# Patient Record
Sex: Female | Born: 1950 | Race: Black or African American | Hispanic: No | Smoking: Never smoker
Health system: Southern US, Community
[De-identification: ages and names within clinical notes are randomized; demographics above are authoritative.]

## PROBLEM LIST (undated history)

## (undated) DIAGNOSIS — Z8719 Personal history of other diseases of the digestive system: Secondary | ICD-10-CM

## (undated) DIAGNOSIS — I10 Essential (primary) hypertension: Secondary | ICD-10-CM

## (undated) DIAGNOSIS — K4090 Unilateral inguinal hernia, without obstruction or gangrene, not specified as recurrent: Secondary | ICD-10-CM

## (undated) DIAGNOSIS — K440 Diaphragmatic hernia with obstruction, without gangrene: Secondary | ICD-10-CM

## (undated) DIAGNOSIS — I2699 Other pulmonary embolism without acute cor pulmonale: Secondary | ICD-10-CM

## (undated) DIAGNOSIS — M1990 Unspecified osteoarthritis, unspecified site: Secondary | ICD-10-CM

## (undated) DIAGNOSIS — K3189 Other diseases of stomach and duodenum: Secondary | ICD-10-CM

## (undated) DIAGNOSIS — H409 Unspecified glaucoma: Secondary | ICD-10-CM

## (undated) DIAGNOSIS — D649 Anemia, unspecified: Secondary | ICD-10-CM

## (undated) DIAGNOSIS — K219 Gastro-esophageal reflux disease without esophagitis: Secondary | ICD-10-CM

## (undated) HISTORY — PX: DENTAL SURGERY: SHX609

## (undated) HISTORY — DX: Essential (primary) hypertension: I10

## (undated) HISTORY — DX: Other diseases of stomach and duodenum: K31.89

## (undated) HISTORY — PX: COLONOSCOPY: SHX174

## (undated) HISTORY — PX: CYST REMOVAL NECK: SHX6281

## (undated) HISTORY — DX: Other pulmonary embolism without acute cor pulmonale: I26.99

## (undated) HISTORY — DX: Diaphragmatic hernia with obstruction, without gangrene: K44.0

---

## 2001-04-26 ENCOUNTER — Encounter: Payer: Self-pay | Admitting: Family Medicine

## 2001-04-26 ENCOUNTER — Encounter: Admission: RE | Admit: 2001-04-26 | Discharge: 2001-04-26 | Payer: Self-pay | Admitting: Family Medicine

## 2001-04-30 ENCOUNTER — Ambulatory Visit (HOSPITAL_COMMUNITY): Admission: RE | Admit: 2001-04-30 | Discharge: 2001-04-30 | Payer: Self-pay | Admitting: Gastroenterology

## 2001-07-07 ENCOUNTER — Ambulatory Visit (HOSPITAL_COMMUNITY): Admission: RE | Admit: 2001-07-07 | Discharge: 2001-07-07 | Payer: Self-pay | Admitting: Gastroenterology

## 2001-07-23 ENCOUNTER — Encounter (INDEPENDENT_AMBULATORY_CARE_PROVIDER_SITE_OTHER): Payer: Self-pay | Admitting: Specialist

## 2001-07-23 ENCOUNTER — Ambulatory Visit (HOSPITAL_COMMUNITY): Admission: RE | Admit: 2001-07-23 | Discharge: 2001-07-23 | Payer: Self-pay | Admitting: Gastroenterology

## 2001-08-27 ENCOUNTER — Ambulatory Visit (HOSPITAL_COMMUNITY): Admission: RE | Admit: 2001-08-27 | Discharge: 2001-08-27 | Payer: Self-pay | Admitting: Gastroenterology

## 2006-04-20 ENCOUNTER — Encounter (HOSPITAL_COMMUNITY): Admission: RE | Admit: 2006-04-20 | Discharge: 2006-07-07 | Payer: Self-pay | Admitting: Family Medicine

## 2006-04-22 ENCOUNTER — Ambulatory Visit (HOSPITAL_COMMUNITY): Admission: RE | Admit: 2006-04-22 | Discharge: 2006-04-22 | Payer: Self-pay | Admitting: Family Medicine

## 2006-04-23 ENCOUNTER — Encounter (INDEPENDENT_AMBULATORY_CARE_PROVIDER_SITE_OTHER): Payer: Self-pay | Admitting: *Deleted

## 2006-04-23 ENCOUNTER — Encounter: Payer: Self-pay | Admitting: Obstetrics & Gynecology

## 2006-04-23 ENCOUNTER — Ambulatory Visit: Payer: Self-pay | Admitting: Obstetrics & Gynecology

## 2006-05-07 ENCOUNTER — Ambulatory Visit: Payer: Self-pay | Admitting: Obstetrics & Gynecology

## 2006-05-19 ENCOUNTER — Encounter: Admission: RE | Admit: 2006-05-19 | Discharge: 2006-05-19 | Payer: Self-pay | Admitting: Obstetrics & Gynecology

## 2006-06-18 ENCOUNTER — Ambulatory Visit: Payer: Self-pay | Admitting: Obstetrics & Gynecology

## 2007-03-10 ENCOUNTER — Ambulatory Visit: Payer: Self-pay | Admitting: Internal Medicine

## 2007-03-10 ENCOUNTER — Ambulatory Visit: Payer: Self-pay | Admitting: *Deleted

## 2010-04-14 ENCOUNTER — Encounter: Payer: Self-pay | Admitting: Family Medicine

## 2010-08-06 NOTE — Assessment & Plan Note (Signed)
NAMETHETA, LEAF             ACCOUNT NO.:  [id number]   MEDICAL RECORD NO.:  [id number]          PATIENT TYPE:  POB   LOCATION:  [facility]         FACILITY:  [facility]   PHYSICIAN:  Elsie Lincoln, MD      DATE OF BIRTH:  1950-06-20   DATE OF SERVICE:  04/23/2006                                  CLINIC NOTE   The patient is a 60 year old G5, para 3-0-2-3, LMP 03/17/06, who was  sent to me by Dr. Nilda Simmer for history of menorrhagia and a  hemoglobin around 5. The patient has not been to a gynecologist in over  4 years. She does have a history of being anemic 4 years ago and  received IV iron. She did have a colonoscopy at the time and a Pap smear  which was negative per the patient. She has never had an endometrial  biopsy that she is aware of. Her periods started being heavy about 4  years ago. She has no history of ovarian cyst, fibroid tumors, or  sexually transmitted diseases. She is sexually active currently. She is  not __________  from her anemia. The patient also received a blood  transfusion on Tuesday, which was 2 days ago. Received 3 packed units of  __________  hemoglobin __________  between 8 and 9. She also had a  history of a blood transfusion secondary to a cervical laceration during  her second pregnancy. She has never been worked up for sickle cell that  she is aware of. She was on Provera for 5 days and I am going to  continue this for 2 more weeks because I do not want her to have a  period until we figure out what is wrong with her.   PAST MEDICAL HISTORY:  The only bad illness the patient can ever  remember having is a GI bug in 1989. She has no other medical problem,  however, she is hypertensive today.   PAST SURGERY:  A BTL and a termination.   PAST GYN HISTORY:  As above plus no abnormal Pap smears. Her last Pap  smear was 4 years ago. No STDs, cysts, or fibroids. Three vaginal  deliveries, one SAB, and 1 TOP.   FAMILY HISTORY:  Her  sister had breast cancer and her last mammogram was  4 years ago and was BIRADS 1. No other familial cancers.   ALLERGIES:  None.   MEDICATIONS:  Tussionex p.r.n.   SOCIAL HISTORY:  She sometimes drinks caffeinated beverages, otherwise  does not smoke, drink, or do drugs.   REVIEW OF SYMPTOMS:  Is positive for vaginal itching, pain with  intercourse, loss of urine with coughing and sneezing, coughing up  phlegm, and swelling in the legs.   PHYSICAL EXAMINATION:  Pulse 57, blood pressure 156/91 and the recheck  was 158/90, weight 156.  GENERAL:  Well nourished, well developed, no apparent distress.  HEENT:  Normocephalic, atraumatic. Thyroid no masses.  LUNGS:  Clear to auscultation bilaterally.  HEART:  Regular rate and rhythm. No flow murmur.  BREASTS:  No masses, no lymphadenopathy.  ABDOMEN:  Soft, nontender, no organomegaly. There is about a 0.5 cm to 1  cm  umbilical hernia that the patient did not know she had. This is most  likely from her tubal ligation.  GENITALIA:  Tanner 5. Vagina pink, however, her cervix is extremely  anterior, almost beneath the pubic bone secondary to a retroverted  uterus and a large posterior fibroid. After several attempts with  different size speculums, was unable to visualize the cervix. I did pass  the Pap smear brushes over my finger into the cervical os and got a  specimen. An endometrial biopsy is not possible. The patient is very  tender with the exam, probably secondary to the large fibroid pushing  into her vagina and rectum. Rectovaginal you could feel a large fibroid  posteriorly.  RECTUM:  The patient also has a hemorrhoid that does not appear to be  thrombosed.  EXTREMITIES:  The patient does have 1+ pitting edema.   ASSESSMENT AND PLAN:  A 60 year old G5, para 3-0-1-3 female with large,  approximately 6 cm fibroid on ultrasound and severe menorrhagia causing  severe anemia requiring blood transfusions.  1. Pap smear and cultures  today.  2. The patient will most likely need a hysterectomy given the severe      anemia. We could try medications first. She could be on birth      control pills continuously but, if she is having pain with      intercourse, she will most likely need a hysterectomy and we will      talk about this in detail later.  3. The patient needs a mammogram, which was ordered today.  4. The patient is to come back in 2 weeks for result and plan. In the      meantime, the patient will be Provera, 10 mg a day, and also Tandem      vitamins with iron.           ______________________________  Elsie Lincoln, MD     KL/MEDQ  D:  04/23/2006  T:  04/23/2006  Job:  [id number]   cc:   Nilda Simmer, MD

## 2010-08-09 NOTE — Procedures (Signed)
[place]  Patient:    Alyssa Peterson, Alyssa Peterson Visit Number: [id number] MRN: [id number]          Service Type: END Location: ENDO Attending Physician:  Orland Mustard Dictated by:   Llana Aliment. Randa Evens, M.D. Proc. Date: 07/23/01 Admit Date:  07/23/2001 Discharge Date: 07/23/2001   CC:         Gretta Arab. Valentina Lucks, M.D.   Procedure Report  DATE OF BIRTH:  October 03, 1950  PROCEDURE PERFORMED:  Esophagogastroduodenscopy with biopsies.  ENDOSCOPIST:  Llana Aliment. Randa Evens, M.D.  MEDICATIONS:  INDICATIONS:  Iron deficiency anemia with the patient ultimately requiring IV iron.  DESCRIPTION OF PROCEDURE:  The procedure had been explained to the patient and consent obtained.  With the patient in the left lateral decubitus position, the Olympus scope was inserted, advanced under direct visualization.  The stomach was entered, pylorus identified and passed.  The duodenum including the bulb and second portion were seen well.  The scope was withdrawn back into the stomach.  Several random biopsies were taken of the small bowel duodenum to test for celiac disease.  There were no duodenal ulcers.  The pyloric channel, antrum and body were normal. Fundus and cardia were seen well in the retroflex view and were normal.  The scope was withdrawn and the distal and proximal  esophagus were normal.  The patient tolerated the procedure quite well, was maintained on low-flow oxygen and pulse oximeter throughout the procedure. ASSESSMENT:  Essentially normal endoscopy.  Nothing to explain the iron deficiency anemia.  PLAN:  Will check pathology.  See back in the office in three months and repeat a CBC in two months. Dictated by:   Llana Aliment. Randa Evens, M.D. Attending Physician:  Orland Mustard DD:  07/23/01 TD:  07/26/01 Job: [id number]

## 2010-08-09 NOTE — Op Note (Signed)
[place]  Patient:    Alyssa Peterson, Alyssa Peterson Visit Number: [id number] MRN: [id number]          Service Type: END Location: ENDO Attending Physician:  Orland Mustard Dictated by:   Llana Aliment. Randa Evens, M.D. Proc. Date: 04/30/01 Admit Date:  04/30/2001   CC:         Gretta Arab. Valentina Lucks, M.D.   Operative Report  PROCEDURE:  Colonoscopy.  MEDICATIONS:  Fentanyl 100 mcg, Versed 10 mg IV.  SCOPE:  Olympus pediatric video colonoscope.  INDICATIONS:  Strong family history of colon cancer.  Mother has incurable colon cancer.  DESCRIPTION OF PROCEDURE:  The procedure had been explained to the patient and consent obtained.  With the patient in the left lateral decubitus position, the Olympus pediatric video colonoscope was inserted and administered under direct visualization.  The prep was quite good.  We were able to advance to the cecum.  The ileocecal valve was seen and the scope was withdrawn.  The cecum, ascending colon, hepatic flexure, transverse colon, splenic flexure, descending, and sigmoid colon were seen well upon removal.  No polyps or lesions were seen.  No significant diverticular disease.  The scope was withdrawn.  The patient tolerated the procedure well.  ASSESSMENT:  No evidence of colon polyps in this high-risk individual.  PLAN:  Will recommend repeating in five years. Dictated by:   Llana Aliment. Randa Evens, M.D. Attending Physician:  Orland Mustard DD:  04/30/01 TD:  05/01/01 Job: [id number]

## 2014-05-16 ENCOUNTER — Ambulatory Visit (INDEPENDENT_AMBULATORY_CARE_PROVIDER_SITE_OTHER): Payer: No Typology Code available for payment source | Admitting: Sports Medicine

## 2014-05-16 VITALS — BP 142/86 | HR 76 | Temp 98.3°F | Resp 17 | Ht <= 58 in | Wt 137.0 lb

## 2014-05-16 DIAGNOSIS — H1089 Other conjunctivitis: Secondary | ICD-10-CM

## 2014-05-16 DIAGNOSIS — A499 Bacterial infection, unspecified: Secondary | ICD-10-CM

## 2014-05-16 DIAGNOSIS — H109 Unspecified conjunctivitis: Secondary | ICD-10-CM

## 2014-05-16 MED ORDER — POLYMYXIN B-TRIMETHOPRIM 10000-0.1 UNIT/ML-% OP SOLN
1.0000 [drp] | Freq: Four times a day (QID) | OPHTHALMIC | Status: AC
Start: 2014-05-16 — End: 2014-05-22

## 2014-05-16 NOTE — Patient Instructions (Signed)

## 2014-05-16 NOTE — Progress Notes (Signed)
   Subjective:    Patient ID: Alyssa LoronHelen V Peterson, female    DOB: 10-17-1950, 64 y.o.   MRN: [id number]  [name] is a 64 year-old female who presents with concern over right eye conjunctivitis. Onset was about 2 days ago, characterized by a pink eye. She has also had mild URI symptoms, characterized by a cough. She has tried vysine eye drops. She describes mild eye soreness and like "sand in her eye". In the AM she notes some crusting. 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 She does not wear contact lenses. She has had 1 or 2 prior episodes of pink eye.  Denies headache, fevers, chills, visual changes, eye pain, photophobia, hx of eye trauma, arthralgias, weight loss, or rash.  Sexual Hx: No risk for gonorrhea. No dysuria or vaginal discharge. Social Hx: Works at a daycare.  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 Recent Travel: None Review of Systems 7 point review of systems was performed and was otherwise negative unless noted in the history of present illness.     Objective:   Physical Exam BP 142/86 mmHg  Pulse 76  Temp(Src) 98.3 F (36.8 C) (Oral)  Resp 17  Ht 4' 5.5" (1.359 m)  Wt 137 lb (62.143 kg)  BMI 33.65 kg/m2  SpO2 100% General appearance: alert, cooperative and appears stated age Eyes: positive findings: conjunctiva: 1+ bacterial conjunctivitis in right eye, scant white dry crusted exudate in right eye. No corneal abrasion or ulceration visible with ophthalmoscope. Left eye without abnormality. Visual acuity grossly intact. Ears: normal TM's and external ear canals both ears Nose: Nares normal. Septum midline. Mucosa normal. No drainage or sinus tenderness. Throat: lips, mucosa, and tongue normal; teeth and gums normal Neck: mild anterior cervical adenopathy, no carotid bruit, no JVD, supple, symmetrical, trachea midline and thyroid not enlarged, symmetric, no tenderness/mass/nodules Lymph nodes: Cervical adenopathy: soft,  tender, mobile bilateral anter cervical LAD and post-auricular LAD, no pre-auricular LAD     Assessment & Plan:  1. Acute Bacterial Conjunctivitis -Supportive Cares Discussed -Rx Poly-Trim drops 1-2 drops both eyes QID x 5-7 days -Note for work at daycare given for at least 24 hrs of drops, no exudates before return -Discussed hand washing, strategies to avoid spread, and reasons to call or RTC including persistent fevers. Increasing eye pain, worsening visual acuity, headache, nausea, vomiting, or for any other concerns. She verbalized understanding and agreement. -Follow-up if needed.  Dr. Joellyn HaffPick-Jacobs, DO Sports Medicine Fellow

## 2014-05-19 NOTE — Progress Notes (Signed)
The recorded information has been reviewed and considered. Agree with A/P Dr Keymon Mcelroy   

## 2016-04-04 ENCOUNTER — Encounter: Payer: Self-pay | Admitting: Family Medicine

## 2016-04-04 ENCOUNTER — Ambulatory Visit (INDEPENDENT_AMBULATORY_CARE_PROVIDER_SITE_OTHER): Payer: BLUE CROSS/BLUE SHIELD | Admitting: Family Medicine

## 2016-04-04 VITALS — BP 120/84 | HR 85 | Temp 98.4°F | Resp 16 | Ht 63.5 in | Wt 131.0 lb

## 2016-04-04 DIAGNOSIS — Z1211 Encounter for screening for malignant neoplasm of colon: Secondary | ICD-10-CM | POA: Diagnosis not present

## 2016-04-04 DIAGNOSIS — Z8371 Family history of colonic polyps: Secondary | ICD-10-CM | POA: Diagnosis not present

## 2016-04-04 DIAGNOSIS — L03012 Cellulitis of left finger: Secondary | ICD-10-CM | POA: Diagnosis not present

## 2016-04-04 DIAGNOSIS — R1909 Other intra-abdominal and pelvic swelling, mass and lump: Secondary | ICD-10-CM

## 2016-04-04 MED ORDER — MUPIROCIN 2 % EX OINT: 1.0000 "application " | TOPICAL_OINTMENT | Freq: Two times a day (BID) | CUTANEOUS | 0 refills | Status: DC

## 2016-04-04 MED ORDER — CHLORHEXIDINE GLUCONATE 4 % EX LIQD: Freq: Every day | CUTANEOUS | 0 refills | Status: DC | PRN

## 2016-04-04 NOTE — Progress Notes (Signed)
Chief Complaint  Patient presents with  . 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  She denies purulent drainage from the finger. No trauma. No known injury.  Nodule inguinal - left She reports that she has been having a left inguinal nodule that has been present for 8 months but a week ago a little boy at work ran into her and the nodule got tender and so she became concerned.  No fevers or chills. No abdominal pain. No history of hernias or prior abdominal surgery.   Family history of colon polyps She reports that she had a normal colonoscopy at age 71.  She states that she has a family history of colon polyps in her brother. She denies rectal bleeding or unexplained weight loss.   No past medical history on file.  Current Outpatient Prescriptions  Medication Sig Dispense Refill  . chlorhexidine (HIBICLENS) 4 % external liquid Apply topically daily as needed. 120 mL 0  . mupirocin ointment (BACTROBAN) 2 % Apply 1 application topically 2 (two) times daily. 22 g 0   No current facility-administered medications for this visit.     Allergies:  Allergies  Allergen Reactions  . Penicillins Hives    No past surgical history on file.  Social History   Social History  . Marital status: Divorced    Spouse name: N/A  . Number of children: N/A  . Years of education: N/A   Social History Main Topics  . Smoking status: Never Smoker  . Smokeless tobacco: None  . Alcohol use None  . Drug use: Unknown  . Sexual activity: Not Asked   Other Topics Concern  . None   Social History Narrative  . None    Review of Systems  Constitutional: Negative for chills, fever and weight loss.  Respiratory: Negative for cough, shortness of breath and wheezing.   Cardiovascular: Negative for chest pain, palpitations  and leg swelling.  Gastrointestinal: Negative for abdominal pain, blood in stool, constipation, diarrhea, heartburn, melena, nausea and vomiting.  Musculoskeletal: Negative for joint pain, myalgias and neck pain.  Skin: Negative for itching and rash.    Objective: Vitals:   04/04/16 1715  BP: 120/84  Pulse: 85  Resp: 16  Temp: 98.4 F (36.9 C)  SpO2: 99%  Weight: 131 lb (59.4 kg)  Height: 5' 3.5" (1.613 m)    Physical Exam  Constitutional: She is oriented to person, place, and time. She appears well-developed and well-nourished.  HENT:  Head: Normocephalic and atraumatic.  Right Ear: External ear normal.  Left Ear: External ear normal.  Eyes: Conjunctivae and EOM are normal.  Cardiovascular: Normal rate, regular rhythm and normal heart sounds.   Pulmonary/Chest: Effort normal and breath sounds normal. No respiratory distress. She has no wheezes.  Abdominal: Soft. Bowel sounds are normal. She exhibits no distension and no mass. There is no tenderness. There is no rebound and no guarding. A hernia is present.  Palpable left inguinal nodule 3cm x 3cm, oval, nontender, soft  Neurological: She is alert and oriented to person, place, and time.  Skin: Skin is warm. Capillary refill takes less than 2 seconds. No rash noted.  Psychiatric: She has a normal mood and affect. Her behavior is normal. Judgment and thought content normal.    Assessment  and Plan [name] was seen today for hand pain.  Diagnoses and all orders for this visit:  Paronychia of finger of left hand- topical care, advised proper hand hygiene and wearing gloves -     mupirocin ointment (BACTROBAN) 2 %; Apply 1 application topically 2 (two) times daily. -     chlorhexidine (HIBICLENS) 4 % external liquid; Apply topically daily as needed.  Inguinal nodule- discussed follow up with General Surgery -     Ambulatory referral to General Surgery  Family history of colonic polyps Screening for colon cancer -      Discussed screening -     [facility] COLONOSCOPY      Zoe A Creta LevinStallings

## 2016-04-04 NOTE — Patient Instructions (Addendum)
   IF you received an x-ray today, you will receive an invoice from [facility]. Please contact [facility] at [phone] with questions or concerns regarding your invoice.   IF you received labwork today, you will receive an invoice from [facility]. Please contact [facility] at [phone] with questions or concerns regarding your invoice.   Our billing staff will not be able to assist you with questions regarding bills from these companies.  You will be contacted with the lab results as soon as they are available. The fastest way to get your results is to activate your My Chart account. Instructions are located on the last page of this paperwork. If you have not heard from us regarding the results in 2 weeks, please contact this office.     Paronychia Introduction Paronychia is an infection of the skin that surrounds a nail. It usually affects the skin around a fingernail, but it may also occur near a toenail. It often causes pain and swelling around the nail. This condition may come on suddenly or develop over a longer period. In some cases, a collection of pus (abscess) can form near or under the nail. Usually, paronychia is not serious and it clears up with treatment. What are the causes? This condition may be caused by bacteria or fungi. It is commonly caused by either Streptococcus or Staphylococcus bacteria. The bacteria or fungi often cause the infection by getting into the affected area through an opening in the skin, such as a cut or a hangnail. What increases the risk? This condition is more likely to develop in:  People who get their hands wet often, such as those who work as dishwashers, bartenders, or nurses.  People who bite their fingernails or suck their thumbs.  People who trim their nails too short.  People who have hangnails or injured fingertips.  People who get manicures.  People who have diabetes. What are the signs or symptoms? Symptoms of  this condition include:  Redness and swelling of the skin near the nail.  Tenderness around the nail when you touch the area.  Pus-filled bumps under the cuticle. The cuticle is the skin at the base or sides of the nail.  Fluid or pus under the nail.  Throbbing pain in the area. How is this diagnosed? This condition is usually diagnosed with a physical exam. In some cases, a sample of pus may be taken from an abscess to be tested in a lab. This can help to determine what type of bacteria or fungi is causing the condition. How is this treated? Treatment for this condition depends on the cause and severity of the condition. If the condition is mild, it may clear up on its own in a few days. Your health care provider may recommend soaking the affected area in warm water a few times a day. When treatment is needed, the options may include:  Antibiotic medicine, if the condition is caused by a bacterial infection.  Antifungal medicine, if the condition is caused by a fungal infection.  Incision and drainage, if an abscess is present. In this procedure, the health care provider will cut open the abscess so the pus can drain out. Follow these instructions at home:  Soak the affected area in warm water if directed to do so by your health care provider. You may be told to do this for 20 minutes, 2-3 times a day. Keep the area dry in between soakings.  Take medicines only as directed by your   health care provider.  If you were prescribed an antibiotic medicine, finish all of it even if you start to feel better.  Keep the affected area clean.  Do not try to drain a fluid-filled bump yourself.  If you will be washing dishes or performing other tasks that require your hands to get wet, wear rubber gloves. You should also wear gloves if your hands might come in contact with irritating substances, such as cleaners or chemicals.  Follow your health care provider's instructions about:  Wound  care.  Bandage (dressing) changes and removal. Contact a health care provider if:  Your symptoms get worse or do not improve with treatment.  You have a fever or chills.  You have redness spreading from the affected area.  You have continued or increased fluid, blood, or pus coming from the affected area.  Your finger or knuckle becomes swollen or is difficult to move. This information is not intended to replace advice given to you by your health care provider. Make sure you discuss any questions you have with your health care provider. Document Released: 09/03/2000 Document Revised: 08/16/2015 Document Reviewed: 02/15/2014  2017 Elsevier  

## 2016-04-09 ENCOUNTER — Ambulatory Visit: Payer: BLUE CROSS/BLUE SHIELD | Admitting: Family Medicine

## 2016-04-16 ENCOUNTER — Ambulatory Visit (INDEPENDENT_AMBULATORY_CARE_PROVIDER_SITE_OTHER): Payer: BLUE CROSS/BLUE SHIELD | Admitting: Family Medicine

## 2016-04-16 ENCOUNTER — Encounter: Payer: Self-pay | Admitting: Family Medicine

## 2016-04-16 VITALS — BP 140/84 | HR 87 | Temp 99.0°F | Resp 16 | Ht 63.5 in | Wt 129.6 lb

## 2016-04-16 DIAGNOSIS — Z1211 Encounter for screening for malignant neoplasm of colon: Secondary | ICD-10-CM

## 2016-04-16 DIAGNOSIS — Z124 Encounter for screening for malignant neoplasm of cervix: Secondary | ICD-10-CM

## 2016-04-16 DIAGNOSIS — Z1159 Encounter for screening for other viral diseases: Secondary | ICD-10-CM | POA: Diagnosis not present

## 2016-04-16 DIAGNOSIS — Z0000 Encounter for general adult medical examination without abnormal findings: Secondary | ICD-10-CM

## 2016-04-16 LAB — POCT URINALYSIS DIP (MANUAL ENTRY)
BILIRUBIN UA: NEGATIVE
BILIRUBIN UA: NEGATIVE
Blood, UA: NEGATIVE
Glucose, UA: NEGATIVE
LEUKOCYTES UA: NEGATIVE
NITRITE UA: NEGATIVE
PROTEIN UA: NEGATIVE
Spec Grav, UA: 1.025
Urobilinogen, UA: 1
pH, UA: 7

## 2016-04-16 NOTE — Patient Instructions (Addendum)
We recommend that you schedule a mammogram for breast cancer screening. Typically, you do not need a referral to do this. Please contact a local imaging center to schedule your mammogram.  [facility] - [phone](336) [phone]  *ask for the Radiology Department [facility]) - [phone] or [phone]  [facility] - [phone] [facility] - [phone]) [phone] [facility] - [phone]) [phone]  *ask for the Radiology Department [facility] - [phone]  *ask for the Radiology Department [facility] - [phone]  *ask for the Mammography Department [facility] - [phone]    IF you received an x-ray today, you will receive an invoice from [facility]. Please contact [facility] at [phone] with questions or concerns regarding your invoice.   IF you received labwork today, you will receive an invoice from [facility]. Please contact [facility] at [phone] with questions or concerns regarding your invoice.   Our billing staff will not be able to assist you with questions regarding bills from these companies.  You will be contacted with the lab results as soon as they are available. The fastest way to get your results is to activate your My Chart account. Instructions are located on the last page of this paperwork. If you have not heard from us regarding the results in 2 weeks, please contact this office.     We recommend that you schedule a mammogram for breast cancer screening. Typically, you do not need a referral to do this. Please contact a local imaging center to schedule your mammogram.  [facility] - [phone](336) [phone]  *ask for the Radiology Department [facility]) - [phone] or [phone]  [facility] - [phone] [facility] - [phone]) [phone] [facility] - [phone]) [phone]  *ask for the  Radiology Department [facility] - [phone]) [phone]  *ask for the Radiology Department [facility] - [phone]  *ask for the Mammography Department [facility] - [phone]

## 2016-04-16 NOTE — Progress Notes (Signed)
Chief Complaint  Patient presents with  . Annual Exam    with pap    Subjective:  Alyssa Peterson is a 66 y.o. female here for a health maintenance visit.  Patient is established pt  Patient Active Problem List   Diagnosis Date Noted  . Family history of colonic polyps 04/04/2016    No past medical history on file.  No past surgical history on file.   Outpatient Medications Prior to Visit  Medication Sig Dispense Refill  . chlorhexidine (HIBICLENS) 4 % external liquid Apply topically daily as needed. 120 mL 0  . mupirocin ointment (BACTROBAN) 2 % Apply 1 application topically 2 (two) times daily. 22 g 0   No facility-administered medications prior to visit.     Allergies  Allergen Reactions  . Penicillins Hives     No family history on file.   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 Marital status: Divorced    Spouse name: N/A  . Number of children: N/A  . Years of education: N/A   Occupational History  . Not on file.   Social History Main Topics  . Smoking status: Never Smoker  . Smokeless tobacco: Never Used  . Alcohol use No  . Drug use: Unknown  . Sexual activity: Not on file   Other Topics Concern  . Not on file   Social History Narrative  . No narrative on file   History  Alcohol Use No   History  Smoking Status  . Never Smoker  Smokeless Tobacco  . Never Used   History  Drug use: Unknown    GYN: Sexual Health Menstrual status: absent LMP: No LMP recorded. Patient is postmenopausal. Last pap smear: see HM section History of abnormal pap smears:  Sexually active: no  Current contraception: none  Health Maintenance: See under health Maintenance activity for review of completion dates as well.  There is no immunization history on  file for this patient.    Depression Screen-PHQ2/9 [facility] 04[facility]2016  Decreased Interest 0  Down, Depressed, Hopeless 0  PHQ - 2 Score 0      Depression Severity and Treatment Recommendations:  0-4= None  5-9= Mild / Treatment: Support, educate to call if worse; return in one month  10-14= Moderate / Treatment: Support, watchful waiting; Antidepressant or Psycotherapy  15-19= Moderately severe / Treatment: Antidepressant OR Psychotherapy  >= 20 = Major depression, severe / Antidepressant AND Psychotherapy    Review of Systems   Review of Systems  Constitutional: Negative for chills and fever.  Eyes: Negative for blurred vision, double vision and photophobia.  Respiratory: Negative for shortness of breath and wheezing.   Cardiovascular: Negative for chest pain, palpitations and leg swelling.  Gastrointestinal: Negative for abdominal pain, nausea and vomiting.  Genitourinary: Negative for dysuria, frequency and urgency.  Musculoskeletal: Negative for falls, joint pain and myalgias.  Skin: Negative for itching and rash.  Neurological: Negative for dizziness, tingling, tremors and headaches.  Psychiatric/Behavioral: Negative for depression. The patient does not have insomnia.     See HPI for ROS as well.    Objective:   Vitals:   04/16/16 1640  BP: 140/84  Pulse: 87  Resp: 16  Temp: 99 F (37.2 C)  TempSrc: Oral  SpO2: 97%  Weight: 129  lb 9.6 oz (58.8 kg)  Height: 5' 3.5" (1.613 m)    Body mass index is 22.6 kg/m.  Physical Exam  Constitutional: She is oriented to person, place, and time. She appears well-developed and well-nourished.  HENT:  Head: Normocephalic and atraumatic.  Right Ear: External ear normal.  Left Ear: External ear normal.  Nose: Nose normal.  Eyes: Conjunctivae and EOM are normal. Right eye exhibits no discharge. Left eye exhibits no discharge.  Neck: Normal range of motion. Neck supple.  Abdominal: A hernia is  present. Hernia confirmed positive in the left inguinal area.  Genitourinary: Uterus normal. No breast swelling, tenderness, discharge or bleeding. There is no rash or tenderness on the right labia. There is no rash or tenderness on the left labia. Uterus is not tender. Cervix exhibits no motion tenderness, no discharge and no friability. Right adnexum displays no mass and no tenderness. Left adnexum displays no mass and no tenderness. No erythema or tenderness in the vagina. No vaginal discharge found.  Musculoskeletal: Normal range of motion. She exhibits no edema.  Lymphadenopathy:       Right: No inguinal adenopathy present.       Left: No inguinal adenopathy present.  Neurological: She is alert and oriented to person, place, and time. She has normal reflexes. She displays normal reflexes. No cranial nerve deficit.  Skin: Skin is warm. No rash noted. No erythema.  Psychiatric: She has a normal mood and affect. Her behavior is normal. Judgment and thought content normal.       Assessment/Plan:   Patient was seen for a health maintenance exam.  Counseled the patient on health maintenance issues. Reviewed her health mainteance schedule and ordered appropriate tests (see orders.) Counseled on regular exercise and weight management. Recommend regular eye exams and dental cleaning.   The following issues were addressed today for health maintenance:   Alyssa Peterson was seen today for annual exam.  Diagnoses and all orders for this visit:  Encounter for health maintenance examination- age appropriate screenings reviewed -     Basic metabolic panel -     Lipid panel -     CBC -     TSH -     POCT urinalysis dipstick  Encounter for screening colonoscopy- no current symptoms, will order screening test -     HM COLONOSCOPY  Need for hepatitis C screening test- discussed need for screening based on age -     HCV Ab w/Rflx to Verification  Pap smear for cervical cancer screening- reviewed the  purpose of pap smear for cervical cancer screening Will check for high risk HPV -     Pap IG and HPV (high risk) DNA detection  Other orders -     Interpretation:    No Follow-up on file.    Body mass index is 22.6 kg/m.:  Discussed the patient's BMI with patient. The BMI body mass index is 22.6 kg/m.     No future appointments.  Patient Instructions   We recommend that you schedule a mammogram for breast cancer screening. Typically, you do not need a referral to do this. Please contact a local imaging center to schedule your mammogram.  [facility] - [phone]  *ask for the Radiology Department [facility]) - [phone] or [phone]  [facility] - [phone] [facility] - [phone] [facility] - [phone]  *ask for the Radiology Department [facility] - [phone]  *  ask for the Radiology Department [facility] - [phone]  *ask for the Mammography Department [facility] - [phone]    IF you received an x-ray today, you will receive an invoice from [facility]. Please contact [facility] at [phone] with questions or concerns regarding your invoice.   IF you received labwork today, you will receive an invoice from [place]. Please contact [facility] at [phone] with questions or concerns regarding your invoice.   Our billing staff will not be able to assist you with questions regarding bills from these companies.  You will be contacted with the lab results as soon as they are available. The fastest way to get your results is to activate your My Chart account. Instructions are located on the last page of this paperwork. If you have not heard from [place] regarding the results in 2 weeks, please contact this office.     We recommend that you schedule a mammogram for breast cancer screening. Typically, you do not need  a referral to do this. Please contact a local imaging center to schedule your mammogram.  [facility] - [phone]  *ask for the Radiology Department [facility]) - [phone] or [phone]  [facility] - [phone] [facility] - [phone] [facility] - [phone]  *ask for the Radiology Department [facility] - [phone]  *ask for the Radiology Department [facility] - [phone]  *ask for the Mammography Department [facility] - [phone]

## 2016-04-17 ENCOUNTER — Telehealth: Payer: Self-pay | Admitting: *Deleted

## 2016-04-17 LAB — BASIC METABOLIC PANEL
BUN / CREAT RATIO: 16 (ref 12–28)
BUN: 12 mg/dL (ref 8–27)
CHLORIDE: 104 mmol/L (ref 96–106)
CO2: 26 mmol/L (ref 18–29)
Calcium: 9.5 mg/dL (ref 8.7–10.3)
Creatinine, Ser: 0.75 mg/dL (ref 0.57–1.00)
GFR calc non Af Amer: 85 mL/min/{1.73_m2} (ref >59)
GFR, EST AFRICAN AMERICAN: 98 mL/min/{1.73_m2} (ref >59)
GLUCOSE: 88 mg/dL (ref 65–99)
POTASSIUM: 4 mmol/L (ref 3.5–5.2)
SODIUM: 142 mmol/L (ref 134–144)

## 2016-04-17 LAB — CBC
HEMATOCRIT: 30.1 % — AB (ref 34.0–46.6)
HEMOGLOBIN: 9.2 g/dL — AB (ref 11.1–15.9)
MCH: 21.9 pg — AB (ref 26.6–33.0)
MCHC: 30.6 g/dL — ABNORMAL LOW (ref 31.5–35.7)
MCV: 72 fL — AB (ref 79–97)
Platelets: 286 10*3/uL (ref 150–379)
RBC: 4.21 x10E6/uL (ref 3.77–5.28)
RDW: 17.5 % — ABNORMAL HIGH (ref 12.3–15.4)
WBC: 4.1 10*3/uL (ref 3.4–10.8)

## 2016-04-17 LAB — HCV INTERPRETATION

## 2016-04-17 LAB — LIPID PANEL
CHOLESTEROL TOTAL: 133 mg/dL (ref 100–199)
Chol/HDL Ratio: 1.8 ratio units (ref 0.0–4.4)
HDL: 74 mg/dL (ref >39)
LDL Calculated: 43 mg/dL (ref 0–99)
Triglycerides: 80 mg/dL (ref 0–149)
VLDL Cholesterol Cal: 16 mg/dL (ref 5–40)

## 2016-04-17 LAB — HCV AB W/RFLX TO VERIFICATION: HCV Ab: <0.1 s/co ratio (ref 0.0–0.9)

## 2016-04-17 LAB — TSH: TSH: 0.88 u[IU]/mL (ref 0.450–4.500)

## 2016-04-17 NOTE — Telephone Encounter (Signed)
Patient called stating Dr Creta LevinStallings has a place calling her regarding her surgery, and she was told if she didn't get the message to call back. Patient states she needs to know what the message said. Please advise [phone]

## 2016-04-17 NOTE — Telephone Encounter (Signed)
Alyssa Peterson, is the number listed a call back number for the patient? I am asking only because it is not listed anywhere on her chart. I also do not see any messages that were left for her from our office. I will call her back for clarification once we confirm the correct call back number.

## 2016-04-17 NOTE — Telephone Encounter (Signed)
Yes that is the number the patient provided me, patient stated she needed to be contacted at that number due to her being a work today.

## 2016-04-17 NOTE — Telephone Encounter (Signed)
Tried calling back at number given, no answer. Called home number, female answered stating that she is not home. Will try back tomorrow.  [facility], maybe to schedule? We do not have any msg left in her chart. She can contact their office at [phone] if needed.

## 2016-04-18 LAB — PAP IG AND HPV HIGH-RISK
HPV, high-risk: NEGATIVE
PAP SMEAR COMMENT: 0

## 2016-04-19 NOTE — Telephone Encounter (Signed)
Thank you, I have called patient to advise her to call [facility] for appt on Monday. I have provided her the number to call.

## 2016-04-22 ENCOUNTER — Other Ambulatory Visit: Payer: Self-pay | Admitting: Family Medicine

## 2016-04-22 DIAGNOSIS — Z1231 Encounter for screening mammogram for malignant neoplasm of breast: Secondary | ICD-10-CM

## 2016-04-28 ENCOUNTER — Ambulatory Visit: Payer: Self-pay | Admitting: Surgery

## 2016-04-28 NOTE — H&P (Signed)
History of Present Illness Alyssa Peterson(Alyssa Peterson K. Ryla Cauthon MD; 04/28/2016 10:47 AM) Patient words: hernia.  The patient is a 66 year old female who presents with an inguinal hernia. Referred by Dr. Warrick ParisianSheila Peterson for possible left inguinal hernia  This is a healthy 66 year old female who presents with about a month's of a palpable bulge in her left groin. This bulge is not reducible. It causes occasional discomfort. She denies any obstructive symptoms. There is been minimal enlargement since she first noticed it. She was examined by her primary care physician who thought that this might represent an inguinal hernia. She presents now for surgical evaluation. She has never had any surgery in this area.     Diagnostic Studies History Alyssa Peterson(Alyssa Peterson, CMA; 04/28/2016 10:22 AM) Colonoscopy >10 years ago Pap Smear >5 years ago  Allergies Alyssa Peterson(Alyssa Peterson, CMA; 04/28/2016 10:22 AM) Penicillin G Pot in Dextrose *PENICILLINS*  Medication History (Alyssa Peterson, CMA; 04/28/2016 10:22 AM) Alyssa Peterson (334MG  Tablet Chewable, Oral) Active. Medications Reconciled  Social History Alyssa Peterson(Alyssa Peterson, CMA; 04/28/2016 10:22 AM) Caffeine use Tea. No alcohol use No drug use Tobacco use Never smoker.  Family History Alyssa Peterson(Alyssa Peterson, CMA; 04/28/2016 10:22 AM) Arthritis Father.  Pregnancy / Birth History Alyssa Peterson(Alyssa Peterson, CMA; 04/28/2016 10:22 AM) Age at menarche 17 years. Age of menopause 7051-55 Gravida 5 Maternal age 49-25 Regular periods  Other Problems Alyssa Peterson(Alyssa Peterson, CMA; 04/28/2016 10:22 AM) No pertinent past medical history     Review of Systems [facility](Alyssa Peterson CMA; 04/28/2016 10:22 AM) General Not Present- Appetite Loss, Chills, Fatigue, Fever, Night Sweats, Weight Gain and Weight Loss. Skin Present- Ulcer. Not Present- Change in Wart/Mole, Dryness, Hives, Jaundice, New Lesions, Non-Healing Wounds and Rash. HEENT Not Present- Earache, Hearing Loss, Hoarseness, Nose  Bleed, Oral Ulcers, Ringing in the Ears, Seasonal Allergies, Sinus Pain, Sore Throat, Visual Disturbances, Wears glasses/contact lenses and Yellow Eyes. Breast Not Present- Breast Mass, Breast Pain, Nipple Discharge and Skin Changes. Cardiovascular Not Present- Chest Pain, Difficulty Breathing Lying Down, Leg Cramps, Palpitations, Rapid Heart Rate, Shortness of Breath and Swelling of Extremities. Gastrointestinal Present- Gets full quickly at meals. Not Present- Abdominal Pain, Bloating, Bloody Stool, Change in Bowel Habits, Chronic diarrhea, Constipation, Difficulty Swallowing, Excessive gas, Hemorrhoids, Indigestion, Nausea, Rectal Pain and Vomiting. Female Genitourinary Not Present- Frequency, Nocturia, Painful Urination, Pelvic Pain and Urgency. Musculoskeletal Not Present- Back Pain, Joint Pain, Joint Stiffness, Muscle Pain, Muscle Weakness and Swelling of Extremities. Neurological Not Present- Decreased Memory, Fainting, Headaches, Numbness, Seizures, Tingling, Tremor, Trouble walking and Weakness. Psychiatric Not Present- Anxiety, Bipolar, Change in Sleep Pattern, Depression, Fearful and Frequent crying. Endocrine Present- Hot flashes. Not Present- Cold Intolerance, Excessive Hunger, Hair Changes, Heat Intolerance and New Diabetes. Hematology Not Present- Blood Thinners, Easy Bruising, Excessive bleeding, Gland problems, HIV and Persistent Infections.  Vitals [employer](Alyssa Peterson CMA; 04/28/2016 10:22 AM) 04/28/2016 10:21 AM Weight: 132.13 lb Height: 63.5in Body Surface Area: 1.63 m Body Mass Index: 23.04 kg/m  Temp.: 98.44F(Oral)  Pulse: 96 (Regular)  BP: 134/82 (Sitting, Left Arm, Standard)      Physical Exam Alyssa Peterson(Alyssa Peterson K. Alyssa Dearcos MD; 04/28/2016 10:48 AM)  The physical exam findings are as follows: Note:WDWN in NAD Eyes: Pupils equal, round; sclera anicteric HENT: Oral mucosa moist; good dentition Neck: No masses palpated, no thyromegaly Lungs: CTA bilaterally; normal  respiratory effort CV: Regular rate and rhythm; no murmurs; extremities well-perfused with no edema Abd: +bowel sounds, soft, non-tender, no palpable organomegaly; Left groin - 3 x 2 x  2 cm palpable mass/ bulge; non-reducible; tender to deep palpation; smooth, mobile Skin: Warm, dry; no sign of jaundice Psychiatric - alert and oriented x 4; calm mood and affect    Assessment & Plan Alyssa Peterson K. Alyssa Crickenberger MD; 04/28/2016 10:49 AM)  HERNIA, INGUINAL (K40.90) Impression: Left inguinal  Current Plans Schedule for Surgery - Left inguinal hernia repair with mesh. The surgical procedure has been discussed with the patient. Potential risks, benefits, alternative treatments, and expected outcomes have been explained. All of the patient's questions at this time have been answered. The likelihood of reaching the patient's treatment goal is good. The patient understand the proposed surgical procedure and wishes to proceed. Note:This mass likely represents either a left inguinal hernia with incarcerated fat versus a subcutaneous lipoma that is causing discomfort by putting pressure on the underlying muscle. The patient is quite thin is fairly active. We will plan to repair this as a left inguinal hernia but there is the possibility that we open her groin that this will just be a large subcutaneous lipoma.  Alyssa Peterson. Alyssa Skains, MD, [facility]  General/ Trauma Surgery  04/28/2016 10:49 AM

## 2016-05-21 ENCOUNTER — Encounter: Payer: Self-pay | Admitting: Family Medicine

## 2016-05-26 ENCOUNTER — Ambulatory Visit: Payer: Self-pay

## 2016-06-03 ENCOUNTER — Ambulatory Visit
Admission: RE | Admit: 2016-06-03 | Discharge: 2016-06-03 | Disposition: A | Discharge status: Home / Self Care | Payer: BLUE CROSS/BLUE SHIELD | Source: Ambulatory Visit | Attending: Family Medicine | Admitting: Family Medicine

## 2016-06-03 ENCOUNTER — Ambulatory Visit: Payer: Self-pay

## 2016-06-03 ENCOUNTER — Other Ambulatory Visit: Payer: Self-pay | Admitting: Family Medicine

## 2016-06-03 DIAGNOSIS — Z1231 Encounter for screening mammogram for malignant neoplasm of breast: Secondary | ICD-10-CM

## 2016-06-05 ENCOUNTER — Encounter: Payer: Self-pay | Admitting: Family Medicine

## 2016-07-14 ENCOUNTER — Encounter (HOSPITAL_BASED_OUTPATIENT_CLINIC_OR_DEPARTMENT_OTHER): Payer: Self-pay | Admitting: *Deleted

## 2016-07-15 NOTE — Pre-Procedure Instructions (Signed)
Boost Breeze 8 oz. given to pt. - to drink by 0500 DOS; pt. voiced understanding

## 2016-07-17 ENCOUNTER — Ambulatory Visit (HOSPITAL_BASED_OUTPATIENT_CLINIC_OR_DEPARTMENT_OTHER)
Admission: RE | Admit: 2016-07-17 | Discharge: 2016-07-17 | Disposition: A | Discharge status: Home / Self Care | Payer: BLUE CROSS/BLUE SHIELD | Source: Ambulatory Visit | Attending: Surgery | Admitting: Surgery

## 2016-07-17 ENCOUNTER — Ambulatory Visit (HOSPITAL_BASED_OUTPATIENT_CLINIC_OR_DEPARTMENT_OTHER): Payer: BLUE CROSS/BLUE SHIELD | Admitting: Anesthesiology

## 2016-07-17 ENCOUNTER — Encounter (HOSPITAL_BASED_OUTPATIENT_CLINIC_OR_DEPARTMENT_OTHER): Payer: Self-pay | Admitting: *Deleted

## 2016-07-17 ENCOUNTER — Encounter (HOSPITAL_BASED_OUTPATIENT_CLINIC_OR_DEPARTMENT_OTHER)
Admission: RE | Disposition: A | Discharge status: Home / Self Care | Payer: Self-pay | Source: Ambulatory Visit | Attending: Surgery

## 2016-07-17 DIAGNOSIS — K4090 Unilateral inguinal hernia, without obstruction or gangrene, not specified as recurrent: Secondary | ICD-10-CM | POA: Diagnosis present

## 2016-07-17 DIAGNOSIS — K4190 Unilateral femoral hernia, without obstruction or gangrene, not specified as recurrent: Secondary | ICD-10-CM | POA: Insufficient documentation

## 2016-07-17 HISTORY — DX: Gastro-esophageal reflux disease without esophagitis: K21.9

## 2016-07-17 HISTORY — PX: FEMORAL HERNIA REPAIR: SHX632

## 2016-07-17 HISTORY — DX: Anemia, unspecified: D64.9

## 2016-07-17 HISTORY — DX: Unilateral inguinal hernia, without obstruction or gangrene, not specified as recurrent: K40.90

## 2016-07-17 LAB — POCT HEMOGLOBIN-HEMACUE: HEMOGLOBIN: 10.5 g/dL — AB (ref 12.0–15.0)

## 2016-07-17 SURGERY — HERNIA REPAIR FEMORAL
Anesthesia: General | Site: Inguinal | Laterality: Left

## 2016-07-17 MED ORDER — DEXAMETHASONE SODIUM PHOSPHATE 10 MG/ML IJ SOLN
INTRAMUSCULAR | Status: AC
  Filled 2016-07-17: qty 1

## 2016-07-17 MED ORDER — FENTANYL CITRATE (PF) 100 MCG/2ML IJ SOLN
INTRAMUSCULAR | Status: AC
  Filled 2016-07-17: qty 2

## 2016-07-17 MED ORDER — ONDANSETRON HCL 4 MG/2ML IJ SOLN
INTRAMUSCULAR | Status: AC
  Filled 2016-07-17: qty 2

## 2016-07-17 MED ORDER — MIDAZOLAM HCL 2 MG/2ML IJ SOLN
1.0000 mg | INTRAMUSCULAR | Status: DC | PRN
  Administered 2016-07-17: 2 mg via INTRAVENOUS

## 2016-07-17 MED ORDER — LIDOCAINE 2% (20 MG/ML) 5 ML SYRINGE
INTRAMUSCULAR | Status: DC | PRN
  Administered 2016-07-17: 50 mg via INTRAVENOUS

## 2016-07-17 MED ORDER — FENTANYL CITRATE (PF) 100 MCG/2ML IJ SOLN
INTRAMUSCULAR | Status: DC | PRN
  Administered 2016-07-17: 50 ug via INTRAVENOUS

## 2016-07-17 MED ORDER — HYDROCODONE-ACETAMINOPHEN 5-325 MG PO TABS: 1.0000 | ORAL_TABLET | Freq: Four times a day (QID) | ORAL | 0 refills | Status: DC | PRN

## 2016-07-17 MED ORDER — GLYCOPYRROLATE 0.2 MG/ML IJ SOLN
INTRAMUSCULAR | Status: DC | PRN
  Administered 2016-07-17: 0.2 mg via INTRAVENOUS

## 2016-07-17 MED ORDER — PROMETHAZINE HCL 25 MG/ML IJ SOLN: 6.2500 mg | INTRAMUSCULAR | Status: DC | PRN

## 2016-07-17 MED ORDER — DEXAMETHASONE SODIUM PHOSPHATE 4 MG/ML IJ SOLN
INTRAMUSCULAR | Status: DC | PRN
  Administered 2016-07-17: 8 mg via INTRAVENOUS

## 2016-07-17 MED ORDER — MIDAZOLAM HCL 2 MG/2ML IJ SOLN
INTRAMUSCULAR | Status: AC
  Filled 2016-07-17: qty 2

## 2016-07-17 MED ORDER — FENTANYL CITRATE (PF) 100 MCG/2ML IJ SOLN
50.0000 ug | INTRAMUSCULAR | Status: DC | PRN
  Administered 2016-07-17: 100 ug via INTRAVENOUS

## 2016-07-17 MED ORDER — BUPIVACAINE-EPINEPHRINE (PF) 0.5% -1:200000 IJ SOLN
INTRAMUSCULAR | Status: DC | PRN
  Administered 2016-07-17: 30 mL

## 2016-07-17 MED ORDER — CEFAZOLIN SODIUM-DEXTROSE 2-4 GM/100ML-% IV SOLN
2.0000 g | INTRAVENOUS | Status: AC
  Administered 2016-07-17: 2 g via INTRAVENOUS

## 2016-07-17 MED ORDER — LIDOCAINE 2% (20 MG/ML) 5 ML SYRINGE
INTRAMUSCULAR | Status: AC
  Filled 2016-07-17: qty 5

## 2016-07-17 MED ORDER — ROCURONIUM BROMIDE 10 MG/ML (PF) SYRINGE
PREFILLED_SYRINGE | INTRAVENOUS | Status: AC
  Filled 2016-07-17: qty 5

## 2016-07-17 MED ORDER — CEFAZOLIN SODIUM-DEXTROSE 2-4 GM/100ML-% IV SOLN
INTRAVENOUS | Status: AC
  Filled 2016-07-17: qty 100

## 2016-07-17 MED ORDER — HYDROMORPHONE HCL 1 MG/ML IJ SOLN
0.2500 mg | INTRAMUSCULAR | Status: DC | PRN
Start: 2016-07-17 — End: 2016-07-17

## 2016-07-17 MED ORDER — BUPIVACAINE HCL (PF) 0.5 % IJ SOLN
INTRAMUSCULAR | Status: AC
  Filled 2016-07-17: qty 30

## 2016-07-17 MED ORDER — BUPIVACAINE HCL (PF) 0.25 % IJ SOLN
INTRAMUSCULAR | Status: AC
  Filled 2016-07-17: qty 30

## 2016-07-17 MED ORDER — SCOPOLAMINE 1 MG/3DAYS TD PT72: 1.0000 | MEDICATED_PATCH | Freq: Once | TRANSDERMAL | Status: DC | PRN

## 2016-07-17 MED ORDER — SCOPOLAMINE 1 MG/3DAYS TD PT72: 1.0000 | MEDICATED_PATCH | TRANSDERMAL | Status: DC

## 2016-07-17 MED ORDER — PROPOFOL 10 MG/ML IV BOLUS
INTRAVENOUS | Status: DC | PRN
  Administered 2016-07-17: 300 mg via INTRAVENOUS

## 2016-07-17 MED ORDER — ONDANSETRON HCL 4 MG/2ML IJ SOLN
INTRAMUSCULAR | Status: DC | PRN
  Administered 2016-07-17: 4 mg via INTRAVENOUS

## 2016-07-17 MED ORDER — CHLORHEXIDINE GLUCONATE CLOTH 2 % EX PADS: 6.0000 | MEDICATED_PAD | Freq: Once | CUTANEOUS | Status: DC

## 2016-07-17 MED ORDER — BUPIVACAINE-EPINEPHRINE 0.25% -1:200000 IJ SOLN
INTRAMUSCULAR | Status: DC | PRN
  Administered 2016-07-17: 10 mL

## 2016-07-17 MED ORDER — LACTATED RINGERS IV SOLN
INTRAVENOUS | Status: DC
  Administered 2016-07-17 (×2): via INTRAVENOUS

## 2016-07-17 SURGICAL SUPPLY — 51 items
BENZOIN TINCTURE PRP APPL 2/3 (GAUZE/BANDAGES/DRESSINGS) ×3 IMPLANT
BLADE CLIPPER SURG (BLADE) ×3 IMPLANT
BLADE HEX COATED 2.75 (ELECTRODE) ×3 IMPLANT
BLADE SURG 15 STRL LF DISP TIS (BLADE) ×4 IMPLANT
BLADE SURG 15 STRL SS (BLADE) ×2
CANISTER SUCT 1200ML W/VALVE (MISCELLANEOUS) IMPLANT
CHLORAPREP W/TINT 26ML (MISCELLANEOUS) ×3 IMPLANT
CLSR STERI-STRIP ANTIMIC 1/2X4 (GAUZE/BANDAGES/DRESSINGS) ×3 IMPLANT
COVER BACK TABLE 60X90IN (DRAPES) ×3 IMPLANT
COVER MAYO STAND STRL (DRAPES) ×3 IMPLANT
DECANTER SPIKE VIAL GLASS SM (MISCELLANEOUS) ×3 IMPLANT
DRAIN PENROSE 1/2X12 LTX STRL (WOUND CARE) ×3 IMPLANT
DRAPE LAPAROTOMY TRNSV 102X78 (DRAPE) ×3 IMPLANT
DRAPE UTILITY XL STRL (DRAPES) ×3 IMPLANT
DRSG TEGADERM 4X4.75 (GAUZE/BANDAGES/DRESSINGS) ×3 IMPLANT
ELECT REM PT RETURN 9FT ADLT (ELECTROSURGICAL) ×3
ELECTRODE REM PT RTRN 9FT ADLT (ELECTROSURGICAL) ×2 IMPLANT
GAUZE SPONGE 4X4 12PLY STRL LF (GAUZE/BANDAGES/DRESSINGS) ×3 IMPLANT
GLOVE BIO SURGEON STRL SZ7 (GLOVE) ×3 IMPLANT
GLOVE BIOGEL PI IND STRL 7.5 (GLOVE) ×2 IMPLANT
GLOVE BIOGEL PI INDICATOR 7.5 (GLOVE) ×1
GOWN STRL REUS W/ TWL LRG LVL3 (GOWN DISPOSABLE) ×4 IMPLANT
GOWN STRL REUS W/TWL LRG LVL3 (GOWN DISPOSABLE) ×2
MESH ULTRAPRO 3X6 7.6X15CM (Mesh General) ×3 IMPLANT
NEEDLE HYPO 25X1 1.5 SAFETY (NEEDLE) ×3 IMPLANT
NS IRRIG 1000ML POUR BTL (IV SOLUTION) ×3 IMPLANT
PACK BASIN DAY SURGERY FS (CUSTOM PROCEDURE TRAY) ×3 IMPLANT
PENCIL BUTTON HOLSTER BLD 10FT (ELECTRODE) ×3 IMPLANT
SLEEVE SCD COMPRESS KNEE MED (MISCELLANEOUS) ×3 IMPLANT
SPONGE INTESTINAL PEANUT (DISPOSABLE) ×3 IMPLANT
SPONGE LAP 18X18 X RAY DECT (DISPOSABLE) ×3 IMPLANT
STRIP CLOSURE SKIN 1/2X4 (GAUZE/BANDAGES/DRESSINGS) ×3 IMPLANT
SUT MON AB 4-0 PC3 18 (SUTURE) ×3 IMPLANT
SUT PDS AB 0 CT 36 (SUTURE) IMPLANT
SUT PROLENE 2 0 CT2 30 (SUTURE) ×3 IMPLANT
SUT SILK 2 0 SH (SUTURE) IMPLANT
SUT SILK 3 0 SH 30 (SUTURE) IMPLANT
SUT SILK 3 0 TIES 17X18 (SUTURE)
SUT SILK 3-0 18XBRD TIE BLK (SUTURE) IMPLANT
SUT VIC AB 0 CT1 27 (SUTURE) ×1
SUT VIC AB 0 CT1 27XBRD ANBCTR (SUTURE) ×2 IMPLANT
SUT VIC AB 0 SH 27 (SUTURE) IMPLANT
SUT VIC AB 2-0 SH 27 (SUTURE) ×1
SUT VIC AB 2-0 SH 27XBRD (SUTURE) ×2 IMPLANT
SUT VIC AB 3-0 SH 27 (SUTURE) ×1
SUT VIC AB 3-0 SH 27X BRD (SUTURE) ×2 IMPLANT
SYR CONTROL 10ML LL (SYRINGE) ×3 IMPLANT
TOWEL OR 17X24 6PK STRL BLUE (TOWEL DISPOSABLE) ×3 IMPLANT
TOWEL OR NON WOVEN STRL DISP B (DISPOSABLE) ×3 IMPLANT
TUBE CONNECTING 20X1/4 (TUBING) IMPLANT
YANKAUER SUCT BULB TIP NO VENT (SUCTIONS) IMPLANT

## 2016-07-17 NOTE — H&P (Signed)
History of Present Illness  Patient words: hernia.  The patient is a 66 year old female who presents with an inguinal hernia. Referred by Dr. Warrick Peterson for possible left inguinal hernia  This is a healthy 66 year old female who presents with about a month's of a palpable bulge in her left groin. This bulge is not reducible. It causes occasional discomfort. She denies any obstructive symptoms. There is been minimal enlargement since she first noticed it. She was examined by her primary care physician who thought that this might represent an inguinal hernia. She presents now for surgical evaluation. She has never had any surgery in this area.     Diagnostic Studies History  Colonoscopy >10 years ago Pap Smear >5 years ago  Allergies  Penicillin G Pot in Dextrose *PENICILLINS*  Medication History  Rolaids (  Tablet Chewable, Oral) Active. Medications Reconciled  Social History Caffeine use Tea. No alcohol use No drug use Tobacco use Never smoker.  Family History  Arthritis Father.  Pregnancy / Birth History  Age at menarche 17 years. Age of menopause 52-55 Gravida 5 Maternal age 42-25 Regular periods  Other Problems  No pertinent past medical history     Review of Systems  General Not Present- Appetite Loss, Chills, Fatigue, Fever, Night Sweats, Weight Gain and Weight Loss. Skin Present- Ulcer. Not Present- Change in Wart/Mole, Dryness, Hives, Jaundice, New Lesions, Non-Healing Wounds and Rash. HEENT Not Present- Earache, Hearing Loss, Hoarseness, Nose Bleed, Oral Ulcers, Ringing in the Ears, Seasonal Allergies, Sinus Pain, Sore Throat, Visual Disturbances, Wears glasses/contact lenses and Yellow Eyes. Breast Not Present- Breast Mass, Breast Pain, Nipple Discharge and Skin Changes. Cardiovascular Not Present- Chest Pain, Difficulty Breathing Lying Down, Leg Cramps, Palpitations, Rapid Heart Rate, Shortness of Breath and  Swelling of Extremities. Gastrointestinal Present- Gets full quickly at meals. Not Present- Abdominal Pain, Bloating, Bloody Stool, Change in Bowel Habits, Chronic diarrhea, Constipation, Difficulty Swallowing, Excessive gas, Hemorrhoids, Indigestion, Nausea, Rectal Pain and Vomiting. Female Genitourinary Not Present- Frequency, Nocturia, Painful Urination, Pelvic Pain and Urgency. Musculoskeletal Not Present- Back Pain, Joint Pain, Joint Stiffness, Muscle Pain, Muscle Weakness and Swelling of Extremities. Neurological Not Present- Decreased Memory, Fainting, Headaches, Numbness, Seizures, Tingling, Tremor, Trouble walking and Weakness. Psychiatric Not Present- Anxiety, Bipolar, Change in Sleep Pattern, Depression, Fearful and Frequent crying. Endocrine Present- Hot flashes. Not Present- Cold Intolerance, Excessive Hunger, Hair Changes, Heat Intolerance and New Diabetes. Hematology Not Present- Blood Thinners, Easy Bruising, Excessive bleeding, Gland problems, HIV and Persistent Infections.  Vitals Weight: 132.13 lb Height: 63.5in Body Surface Area: 1.63 m Body Mass Index: 23.04 kg/m  Temp.: 98.64F(Oral)  Pulse: 96 (Regular)  BP: 134/82 (Sitting, Left Arm, Standard)      Physical Exam   The physical exam findings are as follows: Note:WDWN in NAD Eyes: Pupils equal, round; sclera anicteric HENT: Oral mucosa moist; good dentition Neck: No masses palpated, no thyromegaly Lungs: CTA bilaterally; normal respiratory effort CV: Regular rate and rhythm; no murmurs; extremities well-perfused with no edema Abd: +bowel sounds, soft, non-tender, no palpable organomegaly; Left groin - 3 x 2 x 2 cm palpable mass/ bulge; non-reducible; tender to deep palpation; smooth, mobile Skin: Warm, dry; no sign of jaundice Psychiatric - alert and oriented x 4; calm mood and affect    Assessment & Plan   HERNIA, INGUINAL (K40.90) Impression: Left inguinal  Current  Plans Schedule for Surgery - Left inguinal hernia repair with mesh. The surgical procedure has been discussed with the patient. Potential risks, benefits, alternative treatments,  and expected outcomes have been explained. All of the patient's questions at this time have been answered. The likelihood of reaching the patient's treatment goal is good. The patient understand the proposed surgical procedure and wishes to proceed. Note:This mass likely represents either a left inguinal hernia with incarcerated fat versus a subcutaneous lipoma that is causing discomfort by putting pressure on the underlying muscle. The patient is quite thin is fairly active. We will plan to repair this as a left inguinal hernia but there is the possibility that we open her groin that this will just be a large subcutaneous lipoma.  Alyssa Peterson. Alyssa Skains, MD, [facility]  General/ Trauma Surgery  07/17/2016 7:21 AM

## 2016-07-17 NOTE — Discharge Instructions (Signed)
[facility] _______[facility]   HERNIA REPAIR: POST OP INSTRUCTIONS  Always review your discharge instruction sheet given to you by the facility where your surgery was performed. IF YOU HAVE DISABILITY OR FAMILY LEAVE FORMS, YOU MUST BRING THEM TO THE OFFICE FOR PROCESSING.   DO NOT GIVE THEM TO YOUR DOCTOR.  1. A  prescription for pain medication may be given to you upon discharge.  Take your pain medication as prescribed, if needed.  If narcotic pain medicine is not needed, then you may take acetaminophen (Tylenol) or ibuprofen (Advil) as needed. 2. Take your usually prescribed medications unless otherwise directed. If you need a refill on your pain medication, please contact your pharmacy.  They will contact our office to request authorization. Prescriptions will not be filled after 5 pm or on week-ends. 3. You should follow a light diet the first 24 hours after arrival home, such as soup and crackers, etc.  Be sure to include lots of fluids daily.  Resume your normal diet the day after surgery. 4.Most patients will experience some swelling and bruising  in the groin.  Ice packs and reclining will help.  Swelling and bruising can take several days to resolve.  6. It is common to experience some constipation if taking pain medication after surgery.  Increasing fluid intake and taking a stool softener (such as Colace) will usually help or prevent this problem from occurring.  A mild laxative (Milk of Magnesia or Miralax) should be taken according to package directions if there are no bowel movements after 48 hours. 7. Unless discharge instructions indicate otherwise, you may remove your bandages 24-48 hours after surgery, and you may shower at that time.  You may have steri-strips (small skin tapes) in place directly over the incision.  These strips should be left on the skin for 7-10 days.  If your surgeon used skin glue on the incision, you may shower in 24 hours.  The glue will flake off  over the next 2-3 weeks.  Any sutures or staples will be removed at the office during your follow-up visit. 8. ACTIVITIES:  You may resume regular (light) daily activities beginning the next day--such as daily self-care, walking, climbing stairs--gradually increasing activities as tolerated.  You may have sexual intercourse when it is comfortable.  Refrain from any heavy lifting or straining until approved by your doctor.  a.You may drive when you are no longer taking prescription pain medication, you can comfortably wear a seatbelt, and you can safely maneuver your car and apply brakes. b.RETURN TO WORK:   _____________________________________________  9.You should see your doctor in the office for a follow-up appointment approximately 2-3 weeks after your surgery.  Make sure that you call for this appointment within a day or two after you arrive home to insure a convenient appointment time. 10.OTHER INSTRUCTIONS: _________________________    _____________________________________  WHEN TO CALL YOUR DOCTOR: 1. Fever over 101.0 2. Inability to urinate 3. Nausea and/or vomiting 4. Extreme swelling or bruising 5. Continued bleeding from incision. 6. Increased pain, redness, or drainage from the incision  The clinic staff is available to answer your questions during regular business hours.  Please dont hesitate to call and ask to speak to one of the nurses for clinical concerns.  If you have a medical emergency, go to the nearest emergency room or call 911.  A surgeon from [facility] is always on call at the hospital   [address], [place],  [place]  [address] ?  [address] [address], [place], [place]   [address] [phone] ? [phone] ? FAX [phone] Web site: [email or web address]  Post Anesthesia Home Care Instructions  Activity: Get plenty of rest for the remainder of the day. A responsible individual must stay with you for 24 hours following  the procedure.  For the next 24 hours, DO NOT: -Drive a car -Advertising copywriter -Drink alcoholic beverages -Take any medication unless instructed by your physician -Make any legal decisions or sign important papers.  Meals: Start with liquid foods such as gelatin or soup. Progress to regular foods as tolerated. Avoid greasy, spicy, heavy foods. If nausea and/or vomiting occur, drink only clear liquids until the nausea and/or vomiting subsides. Call your physician if vomiting continues.  Special Instructions/Symptoms: Your throat may feel dry or sore from the anesthesia or the breathing tube placed in your throat during surgery. If this causes discomfort, gargle with warm salt water. The discomfort should disappear within 24 hours.  If you had a scopolamine patch placed behind your ear for the management of post- operative nausea and/or vomiting:  1. The medication in the patch is effective for 72 hours, after which it should be removed.  Wrap patch in a tissue and discard in the trash. Wash hands thoroughly with soap and water. 2. You may remove the patch earlier than 72 hours if you experience unpleasant side effects which may include dry mouth, dizziness or visual disturbances. 3. Avoid touching the patch. Wash your hands with soap and water after contact with the patch.

## 2016-07-17 NOTE — Progress Notes (Signed)
Assisted Dr. Singer with left, ultrasound guided, transabdominal plane block. Side rails up, monitors on throughout procedure. See vital signs in flow sheet. Tolerated Procedure well.  

## 2016-07-17 NOTE — Anesthesia Postprocedure Evaluation (Addendum)
Anesthesia Post Note  Patient: Alyssa Peterson  Procedure(s) Performed: Procedure(s) (LRB): LEFT FEMORAL  HERNIA REPAIR WITH MESH (Left)  Patient location during evaluation: PACU Anesthesia Type: General Level of consciousness: sedated Pain management: pain level controlled Vital Signs Assessment: post-procedure vital signs reviewed and stable Respiratory status: spontaneous breathing and respiratory function stable Cardiovascular status: stable Anesthetic complications: no       Last Vitals:  Vitals:   07/17/16 1051 07/17/16 1053  BP: (!) 170/96   Pulse: 64 61  Resp: (!) 21 (!) 22  Temp:      Last Pain:  Vitals:   07/17/16 1015  TempSrc:   PainSc: 2                  [name] DANIEL

## 2016-07-17 NOTE — Op Note (Signed)
Preop diagnosis: Left inguinal hernia Postop diagnosis: Left femoral hernia Procedure performed: Left femoral hernia repair with mesh Surgeon:Jaiyon Wander K. Anesthesia: Gen. via LMA with tap block Indications: This is a 66 year old female who presents with a three-month history of a painful bulge in her left groin. The bulge is not reducible. It causes some discomfort. She denies any obstructive symptoms. We examined her felt that this represented an inguinal hernia. She presents now for surgical repair.  Description of procedure: Patient brought to the operating room and placed in a supine position on the operating room table. After an adequate level general anesthesia was obtained, her left groin was shaved, prepped with ChloraPrep and draped sterile fashion. A timeout was taken to ensure the proper patient and proper procedure. We infiltrated the area over the palpable mass with 0.25% Marcaine. An oblique incision was made. Dissection was carried down the subcutaneous tissues with cautery. We immediately encountered a bulge in the subcutaneous tissue. We bluntly dissected around this area. We identified a moderate-sized hernia sac. We carefully mobilized around the hernia sac down to its base. It became obvious that this was a femoral hernia as we were below the inguinal ligament. The hernia defect measured less than 1 cm. We enlarged slightly with a clamp were able to reduce the hernia sac. There is some adjacent lymphoid tissue that was excised and sent for pathologic examination. The wound was inspected for hemostasis. We plugged the femoral defect with a plug made of ultra Pro mesh. This was sutured in place with 2-0 Prolene. We were careful not to place any sutures posteriorly as this was adjacent to the femoral vessels. We then closed the tissue over the fascial defect with 0 Vicryl. 3-0 Vicryl is used conceptus tissues and 4 Monocryl was used to close the skin. Benzoin Steri-Strips were applied.  The patient was then extubated and brought to the recovery room in stable condition. All sponge, instrument, and needle counts are correct.  Wilmon Arms. Corliss Skains, MD, [facility]  General/ Trauma Surgery  07/17/2016 10:10 AM

## 2016-07-17 NOTE — Anesthesia Procedure Notes (Signed)
Anesthesia Regional Block: TAP block   Pre-Anesthetic Checklist: ,, timeout performed, Correct Patient, Correct Site, Correct Laterality, Correct Procedure, Correct Position, site marked, Risks and benefits discussed,  Surgical consent,  Pre-op evaluation,  At surgeon's request and post-op pain management  Laterality: Left  Prep: chloraprep       Needles:  Injection technique: Single-shot  Needle Type: Echogenic Stimulator Needle     Needle Length: 10cm  Needle Gauge: 21     Additional Needles:   Procedures: ultrasound guided,,,,,,,,  Narrative:  Start time: 07/17/2016 8:07 AM End time: 07/17/2016 8:17 AM Injection made incrementally with aspirations every 5 mL.  Performed by: Personally

## 2016-07-17 NOTE — Anesthesia Procedure Notes (Signed)
Procedure Name: LMA Insertion Date/Time: 07/17/2016 9:03 AM Performed by: Rica Records Pre-anesthesia Checklist: Patient identified, Emergency Drugs available, Suction available and Patient being monitored Patient Re-evaluated:Patient Re-evaluated prior to inductionOxygen Delivery Method: Circle system utilized Preoxygenation: Pre-oxygenation with 100% oxygen Intubation Type: IV induction Ventilation: Mask ventilation without difficulty LMA: LMA inserted LMA Size: 3.0 Number of attempts: 1 Dental Injury: Teeth and Oropharynx as per pre-operative assessment

## 2016-07-17 NOTE — Anesthesia Preprocedure Evaluation (Addendum)
Anesthesia Evaluation  Patient identified by MRN, date of birth, ID band Patient awake    Reviewed: Allergy & Precautions, NPO status , Patient's Chart, lab work & pertinent test results  History of Anesthesia Complications Negative for: history of anesthetic complications  Airway Mallampati: II  TM Distance: >3 FB Neck ROM: Full    Dental no notable dental hx. (+) Dental Advisory Given   Pulmonary neg pulmonary ROS,    Pulmonary exam normal        Cardiovascular negative cardio ROS Normal cardiovascular exam     Neuro/Psych negative neurological ROS  negative psych ROS   GI/Hepatic Neg liver ROS, GERD  ,  Endo/Other  negative endocrine ROS  Renal/GU negative Renal ROS  negative genitourinary   Musculoskeletal negative musculoskeletal ROS (+)   Abdominal   Peds negative pediatric ROS (+)  Hematology negative hematology ROS (+)   Anesthesia Other Findings   Reproductive/Obstetrics negative OB ROS                            Anesthesia Physical Anesthesia Plan  ASA: II  Anesthesia Plan: General   Post-op Pain Management: GA combined w/ Regional for post-op pain   Induction: Intravenous  Airway Management Planned: LMA and Oral ETT  Additional Equipment:   Intra-op Plan:   Post-operative Plan: Extubation in OR  Informed Consent: I have reviewed the patients History and Physical, chart, labs and discussed the procedure including the risks, benefits and alternatives for the proposed anesthesia with the patient or authorized representative who has indicated his/her understanding and acceptance.   Dental advisory given  Plan Discussed with: CRNA and Anesthesiologist  Anesthesia Plan Comments:        Anesthesia Quick Evaluation

## 2016-07-17 NOTE — Transfer of Care (Signed)
Immediate Anesthesia Transfer of Care Note  Patient: Alyssa Peterson  Procedure(s) Performed: Procedure(s): LEFT FEMORAL  HERNIA REPAIR WITH MESH (Left)  Patient Location: PACU  Anesthesia Type:General  Level of Consciousness: awake, alert  and oriented  Airway & Oxygen Therapy: Patient Spontanous Breathing and Patient connected to face mask oxygen  Post-op Assessment: Report given to RN and Post -op Vital signs reviewed and stable  Post vital signs: Reviewed and stable  Last Vitals:  Vitals:   07/17/16 0845 07/17/16 1009  BP:    Pulse: 65   Resp: 20   Temp:  36.5 C    Last Pain:  Vitals:   07/17/16 1009  TempSrc:   PainSc: 0-No pain      Patients Stated Pain Goal: 3 (07/17/16 0733)  Complications: No apparent anesthesia complications

## 2016-07-18 ENCOUNTER — Encounter (HOSPITAL_BASED_OUTPATIENT_CLINIC_OR_DEPARTMENT_OTHER): Payer: Self-pay | Admitting: Surgery

## 2016-08-23 NOTE — Addendum Note (Signed)
Addendum  created 08/23/16 0857 by Garielle Mroz, MD   Sign clinical note    

## 2016-12-10 ENCOUNTER — Encounter: Payer: Self-pay | Admitting: Family Medicine

## 2016-12-10 ENCOUNTER — Ambulatory Visit (INDEPENDENT_AMBULATORY_CARE_PROVIDER_SITE_OTHER): Payer: BLUE CROSS/BLUE SHIELD | Admitting: Family Medicine

## 2016-12-10 VITALS — BP 157/94 | HR 63 | Temp 98.0°F | Resp 16 | Ht 60.05 in | Wt 136.2 lb

## 2016-12-10 DIAGNOSIS — Z23 Encounter for immunization: Secondary | ICD-10-CM

## 2016-12-10 DIAGNOSIS — L03012 Cellulitis of left finger: Secondary | ICD-10-CM

## 2016-12-10 MED ORDER — CLOTRIMAZOLE 1 % EX CREA: 1.0000 "application " | TOPICAL_CREAM | Freq: Two times a day (BID) | CUTANEOUS | 0 refills | Status: DC

## 2016-12-10 MED ORDER — SULFAMETHOXAZOLE-TRIMETHOPRIM 800-160 MG PO TABS: 1.0000 | ORAL_TABLET | Freq: Two times a day (BID) | ORAL | 0 refills | Status: AC

## 2016-12-10 NOTE — Progress Notes (Signed)
Chief Complaint  Patient presents with  . Wound Infection    left index finger, onset: over 1 month ago, swollen and pt pop and green mucus came out, per pt green mucus under  cuticle, no pain just feels weird    HPI  Pt reports that she is having an infection of the index finger of her left hand She took a needles and poked a hole in the nail and pus was expressed now the finger  Past Medical History:  Diagnosis Date  . Anemia   . GERD (gastroesophageal reflux disease)   . Inguinal hernia    left    Current Outpatient Medications  Medication Sig Dispense Refill  . calcium carbonate (TUMS - DOSED IN MG ELEMENTAL CALCIUM) 500 MG chewable tablet Chew 1 tablet by mouth daily.    Marland Kitchen loratadine (CLARITIN) 10 MG tablet Take 10 mg by mouth daily.    . clotrimazole (LOTRIMIN) 1 % cream Apply 1 application topically 2 (two) times daily. 30 g 0   No current facility-administered medications for this visit.     Allergies:  Allergies  Allergen Reactions  . Penicillins Hives    Past Surgical History:  Procedure Laterality Date  . CYST REMOVAL NECK    . DENTAL SURGERY    . FEMORAL HERNIA REPAIR Left 07/17/2016   Procedure: LEFT FEMORAL  HERNIA REPAIR WITH MESH;  Surgeon: Manus Rudd, MD;  Location:  S[facility]ERY [facility];  Service: General;  Laterality: Left;    Social History   Socioeconomic History  . Marital status: Divorced    Spouse name: Not on file  . Number of children: Not on file  . Years of education: Not on file  . Highest education level: Not on file  Occupational History  . Not on file  Social Needs  . Financial resource strain: Not on file  . Food insecurity:    Worry: Not on file    Inability: Not on file  . Transportation needs:    Medical: Not on file    Non-medical: Not on file  Tobacco Use  . Smoking status: Never Smoker  . Smokeless tobacco: Never Used  Substance and Sexual Activity  . Alcohol use: No    Alcohol/week: 0.0 oz  . Drug use: No   . Sexual activity: Not on file  Lifestyle  . Physical activity:    Days per week: Not on file    Minutes per session: Not on file  . Stress: Not on file  Relationships  . Social connections:    Talks on phone: Not on file    Gets together: Not on file    Attends religious service: Not on file    Active member of club or organization: Not on file    Attends meetings of clubs or organizations: Not on file    Relationship status: Not on file  Other Topics Concern  . Not on file  Social History Narrative  . Not on file    ROS  Objective: Vitals:   12/10/16 1649  BP: (!) 157/94  Pulse: 63  Resp: 16  Temp: 98 F (36.7 C)  TempSrc: Oral  SpO2: 96%  Weight: 136 lb 3.2 oz (61.8 kg)  Height: 5' 0.05" (1.525 m)    Physical Exam  Constitutional: She appears well-developed and well-nourished.  HENT:  Head: Normocephalic and atraumatic.   Left index finger with edema at the cuticle edge Cap refill <2s Skin warm and pink  Assessment and Plan Alyssa Peterson  was seen today for wound infection.  Diagnoses and all orders for this visit:  Need for vaccination -     Tdap vaccine greater than or equal to 7yo IM  Paronychia of finger of left hand  Other orders -     clotrimazole (LOTRIMIN) 1 % cream; Apply 1 application topically 2 (two) times daily. -     sulfamethoxazole-trimethoprim (BACTRIM DS,SEPTRA DS) 800-160 MG tablet; Take 1 tablet by mouth 2 (two) times daily.     Alyssa Peterson A Alyssa Peterson

## 2016-12-10 NOTE — Patient Instructions (Addendum)
   IF you received an x-ray today, you will receive an invoice from [facility]. Please contact [facility] at [phone] with questions or concerns regarding your invoice.   IF you received labwork today, you will receive an invoice from [facility]. Please contact [facility] at [phone] with questions or concerns regarding your invoice.   Our billing staff will not be able to assist you with questions regarding bills from these companies.  You will be contacted with the lab results as soon as they are available. The fastest way to get your results is to activate your My Chart account. Instructions are located on the last page of this paperwork. If you have not heard from us regarding the results in 2 weeks, please contact this office.    Paronychia Paronychia is an infection of the skin that surrounds a nail. It usually affects the skin around a fingernail, but it may also occur near a toenail. It often causes pain and swelling around the nail. This condition may come on suddenly or develop over a longer period. In some cases, a collection of pus (abscess) can form near or under the nail. Usually, paronychia is not serious and it clears up with treatment. What are the causes? This condition may be caused by bacteria or fungi. It is commonly caused by either Streptococcus or Staphylococcus bacteria. The bacteria or fungi often cause the infection by getting into the affected area through an opening in the skin, such as a cut or a hangnail. What increases the risk? This condition is more likely to develop in:  People who get their hands wet often, such as those who work as dishwashers, bartenders, or nurses.  People who bite their fingernails or suck their thumbs.  People who trim their nails too short.  People who have hangnails or injured fingertips.  People who get manicures.  People who have diabetes.  What are the signs or symptoms? Symptoms of this condition  include:  Redness and swelling of the skin near the nail.  Tenderness around the nail when you touch the area.  Pus-filled bumps under the cuticle. The cuticle is the skin at the base or sides of the nail.  Fluid or pus under the nail.  Throbbing pain in the area.  How is this diagnosed? This condition is usually diagnosed with a physical exam. In some cases, a sample of pus may be taken from an abscess to be tested in a lab. This can help to determine what type of bacteria or fungi is causing the condition. How is this treated? Treatment for this condition depends on the cause and severity of the condition. If the condition is mild, it may clear up on its own in a few days. Your health care provider may recommend soaking the affected area in warm water a few times a day. When treatment is needed, the options may include:  Antibiotic medicine, if the condition is caused by a bacterial infection.  Antifungal medicine, if the condition is caused by a fungal infection.  Incision and drainage, if an abscess is present. In this procedure, the health care provider will cut open the abscess so the pus can drain out.  Follow these instructions at home:  Soak the affected area in warm water if directed to do so by your health care provider. You may be told to do this for 20 minutes, 2-3 times a day. Keep the area dry in between soakings.  Take medicines only as directed by   your health care provider.  If you were prescribed an antibiotic medicine, finish all of it even if you start to feel better.  Keep the affected area clean.  Do not try to drain a fluid-filled bump yourself.  If you will be washing dishes or performing other tasks that require your hands to get wet, wear rubber gloves. You should also wear gloves if your hands might come in contact with irritating substances, such as cleaners or chemicals.  Follow your health care provider's instructions about: ? Wound care. ? Bandage  (dressing) changes and removal. Contact a health care provider if:  Your symptoms get worse or do not improve with treatment.  You have a fever or chills.  You have redness spreading from the affected area.  You have continued or increased fluid, blood, or pus coming from the affected area.  Your finger or knuckle becomes swollen or is difficult to move. This information is not intended to replace advice given to you by your health care provider. Make sure you discuss any questions you have with your health care provider. Document Released: 09/03/2000 Document Revised: 08/16/2015 Document Reviewed: 02/15/2014 Elsevier Interactive Patient Education  2018 [employer]  

## 2017-07-15 ENCOUNTER — Other Ambulatory Visit: Payer: Self-pay | Admitting: Family Medicine

## 2017-07-15 DIAGNOSIS — Z1231 Encounter for screening mammogram for malignant neoplasm of breast: Secondary | ICD-10-CM

## 2017-08-10 ENCOUNTER — Ambulatory Visit: Payer: BLUE CROSS/BLUE SHIELD

## 2017-08-11 ENCOUNTER — Ambulatory Visit
Admission: RE | Admit: 2017-08-11 | Discharge: 2017-08-11 | Disposition: A | Discharge status: Home / Self Care | Payer: BLUE CROSS/BLUE SHIELD | Source: Ambulatory Visit | Attending: Family Medicine | Admitting: Family Medicine

## 2017-08-11 DIAGNOSIS — Z1231 Encounter for screening mammogram for malignant neoplasm of breast: Secondary | ICD-10-CM

## 2018-04-01 ENCOUNTER — Emergency Department (HOSPITAL_COMMUNITY): Payer: Medicare HMO

## 2018-04-01 ENCOUNTER — Emergency Department (HOSPITAL_COMMUNITY)
Admission: EM | Admit: 2018-04-01 | Discharge: 2018-04-02 | Disposition: A | Discharge status: Home / Self Care | Payer: Medicare HMO | Attending: Emergency Medicine | Admitting: Emergency Medicine

## 2018-04-01 ENCOUNTER — Encounter (HOSPITAL_COMMUNITY): Payer: Self-pay

## 2018-04-01 DIAGNOSIS — L02519 Cutaneous abscess of unspecified hand: Secondary | ICD-10-CM | POA: Diagnosis not present

## 2018-04-01 DIAGNOSIS — Z79899 Other long term (current) drug therapy: Secondary | ICD-10-CM | POA: Diagnosis not present

## 2018-04-01 DIAGNOSIS — R2231 Localized swelling, mass and lump, right upper limb: Secondary | ICD-10-CM | POA: Diagnosis present

## 2018-04-01 DIAGNOSIS — M79644 Pain in right finger(s): Secondary | ICD-10-CM | POA: Diagnosis not present

## 2018-04-01 DIAGNOSIS — L03011 Cellulitis of right finger: Secondary | ICD-10-CM

## 2018-04-01 DIAGNOSIS — M7989 Other specified soft tissue disorders: Secondary | ICD-10-CM | POA: Diagnosis not present

## 2018-04-01 LAB — COMPREHENSIVE METABOLIC PANEL
ALBUMIN: 3.7 g/dL (ref 3.5–5.0)
ALK PHOS: 63 U/L (ref 38–126)
ALT: 14 U/L (ref 0–44)
AST: 18 U/L (ref 15–41)
Anion gap: 8 (ref 5–15)
BILIRUBIN TOTAL: 0.6 mg/dL (ref 0.3–1.2)
BUN: 10 mg/dL (ref 8–23)
CALCIUM: 9.4 mg/dL (ref 8.9–10.3)
CO2: 26 mmol/L (ref 22–32)
Chloride: 107 mmol/L (ref 98–111)
Creatinine, Ser: 0.78 mg/dL (ref 0.44–1.00)
GFR calc Af Amer: >60 mL/min (ref >60)
GFR calc non Af Amer: >60 mL/min (ref >60)
GLUCOSE: 87 mg/dL (ref 70–99)
Potassium: 3.9 mmol/L (ref 3.5–5.1)
SODIUM: 141 mmol/L (ref 135–145)
TOTAL PROTEIN: 6.7 g/dL (ref 6.5–8.1)

## 2018-04-01 LAB — CBC WITH DIFFERENTIAL/PLATELET
ABS IMMATURE GRANULOCYTES: 0.01 10*3/uL (ref 0.00–0.07)
BASOS PCT: 1 %
Basophils Absolute: 0 10*3/uL (ref 0.0–0.1)
EOS ABS: 0.3 10*3/uL (ref 0.0–0.5)
Eosinophils Relative: 5 %
HCT: 39.7 % (ref 36.0–46.0)
Hemoglobin: 12.6 g/dL (ref 12.0–15.0)
IMMATURE GRANULOCYTES: 0 %
Lymphocytes Relative: 23 %
Lymphs Abs: 1.3 10*3/uL (ref 0.7–4.0)
MCH: 28.2 pg (ref 26.0–34.0)
MCHC: 31.7 g/dL (ref 30.0–36.0)
MCV: 88.8 fL (ref 80.0–100.0)
MONOS PCT: 10 %
Monocytes Absolute: 0.5 10*3/uL (ref 0.1–1.0)
NEUTROS ABS: 3.4 10*3/uL (ref 1.7–7.7)
NEUTROS PCT: 61 %
PLATELETS: 225 10*3/uL (ref 150–400)
RBC: 4.47 MIL/uL (ref 3.87–5.11)
RDW: 14.5 % (ref 11.5–15.5)
WBC: 5.5 10*3/uL (ref 4.0–10.5)
nRBC: 0 % (ref 0.0–0.2)

## 2018-04-01 NOTE — ED Triage Notes (Signed)
Pt here for right fifth digit swelling since christmas, denies injury. Finger red and swollen. Denies fever or chills.

## 2018-04-02 DIAGNOSIS — L03011 Cellulitis of right finger: Secondary | ICD-10-CM | POA: Diagnosis not present

## 2018-04-02 DIAGNOSIS — Z79899 Other long term (current) drug therapy: Secondary | ICD-10-CM | POA: Diagnosis not present

## 2018-04-02 MED ORDER — DOXYCYCLINE HYCLATE 100 MG PO CAPS: 100.0000 mg | ORAL_CAPSULE | Freq: Two times a day (BID) | ORAL | 0 refills | Status: DC

## 2018-04-02 MED ORDER — LIDOCAINE-EPINEPHRINE (PF) 2 %-1:200000 IJ SOLN
20.0000 mL | Freq: Once | INTRAMUSCULAR | Status: AC
  Administered 2018-04-02: 20 mL via INTRADERMAL
  Filled 2018-04-02: qty 20

## 2018-04-02 MED ORDER — LIDOCAINE-EPINEPHRINE 2 %-1:100000 IJ SOLN
20.0000 mL | Freq: Once | INTRAMUSCULAR | Status: DC
Start: 2018-04-02 — End: 2018-04-02
  Filled 2018-04-02: qty 20

## 2018-04-02 MED ORDER — LIDOCAINE-EPINEPHRINE 1 %-1:100000 IJ SOLN
20.0000 mL | Freq: Once | INTRAMUSCULAR | Status: DC
  Filled 2018-04-02: qty 20

## 2018-04-02 NOTE — ED Provider Notes (Signed)
[facility] EMERGENCY DEPARTMENT Provider Note   CSN: [id number] Arrival date & time: 04/01/18  1858     History   Chief Complaint Chief Complaint  Patient presents with  . Finger swelling    [name] is a 68 y.o. female.  68 yo F with a chief complaint of finger pain.  Going on for the past 2 or 3 months.  She can think it would get better but continued to get worse.  She states is never been drained before.  She denies nausea denies fevers.  Denies recurrent trauma.  The history is provided by the patient.  Illness  Severity:  Mild Onset quality:  Sudden Duration:  2 months Timing:  Constant Progression:  Worsening Chronicity:  New Associated symptoms: no chest pain, no congestion, no fever, no headaches, no myalgias, no nausea, no rhinorrhea, no shortness of breath, no vomiting and no wheezing     Past Medical History:  Diagnosis Date  . Anemia   . GERD (gastroesophageal reflux disease)   . Inguinal hernia    left    Patient Active Problem List   Diagnosis Date Noted  . Family history of colonic polyps 04/04/2016    Past Surgical History:  Procedure Laterality Date  . CYST REMOVAL NECK    . DENTAL SURGERY    . FEMORAL HERNIA REPAIR Left 07/17/2016   Procedure: LEFT FEMORAL  HERNIA REPAIR WITH MESH;  Surgeon: Manus Rudd, MD;  Location: [facility];  Service: General;  Laterality: Left;     OB History   No obstetric history on file.      Home Medications    Prior to Admission medications   Medication Sig Start Date End Date Taking? Authorizing Provider  calcium carbonate (TUMS - DOSED IN MG ELEMENTAL CALCIUM) 500 MG chewable tablet Chew 1 tablet by mouth daily.    [provider]  clotrimazole (LOTRIMIN) 1 % cream Apply 1 application topically 2 (two) times daily. 12/10/16   Doristine Bosworth, MD  doxycycline (VIBRAMYCIN) 100 MG capsule Take 1 capsule (100 mg total) by mouth 2 (two) times daily.  04/02/18   Melene Plan, DO  loratadine (CLARITIN) 10 MG tablet Take 10 mg by mouth daily.    [provider]    Family History No family history on file.  Social History Social History   Tobacco Use  . Smoking status: Never Smoker  . Smokeless tobacco: Never Used  Substance Use Topics  . Alcohol use: No    Alcohol/week: 0.0 standard drinks  . Drug use: No     Allergies   Penicillins   Review of Systems Review of Systems  Constitutional: Negative for chills and fever.  HENT: Negative for congestion and rhinorrhea.   Eyes: Negative for redness and visual disturbance.  Respiratory: Negative for shortness of breath and wheezing.   Cardiovascular: Negative for chest pain and palpitations.  Gastrointestinal: Negative for nausea and vomiting.  Genitourinary: Negative for dysuria and urgency.  Musculoskeletal: Positive for arthralgias. Negative for myalgias.  Skin: Negative for pallor and wound.  Neurological: Negative for dizziness and headaches.     Physical Exam Updated Vital Signs BP (!) 134/107 (BP Location: Left Arm)   Pulse 68   Temp 97.6 F (36.4 C) (Oral)   Resp 16   SpO2 99%   Physical Exam Vitals signs and nursing note reviewed.  Constitutional:      General: She is not in acute distress.  Appearance: She is well-developed. She is not diaphoretic.  HENT:     Head: Normocephalic and atraumatic.  Eyes:     Pupils: Pupils are equal, round, and reactive to light.  Neck:     Musculoskeletal: Normal range of motion and neck supple.  Cardiovascular:     Rate and Rhythm: Normal rate and regular rhythm.     Heart sounds: No murmur. No friction rub. No gallop.   Pulmonary:     Effort: Pulmonary effort is normal.     Breath sounds: No wheezing or rales.  Abdominal:     General: There is no distension.     Palpations: Abdomen is soft.     Tenderness: There is no abdominal tenderness.  Musculoskeletal:        General: Swelling present. No  tenderness.     Comments: Swelling to the distal tip of the right fifth digit.  Nonfluctuant of the medial aspect just proximal to the nailbed.  Extends to the dorsal aspect of the finger.  No extension past the DIP.  Mildly warm.  Skin:    General: Skin is warm and dry.  Neurological:     Mental Status: She is alert and oriented to person, place, and time.  Psychiatric:        Behavior: Behavior normal.      ED Treatments / Results  Labs (all labs ordered are listed, but only abnormal results are displayed) Labs Reviewed  COMPREHENSIVE METABOLIC PANEL  CBC WITH DIFFERENTIAL/PLATELET    EKG None  Radiology Dg Hand Complete Right  Result Date: 04/01/2018 CLINICAL DATA:  Fifth digit pain and swelling for several weeks, no known injury, initial encounter EXAM: RIGHT HAND - COMPLETE 3+ VIEW COMPARISON:  None. FINDINGS: Distal soft tissue swelling is noted about the distal phalanx. No bony erosion is seen. No fracture or dislocation is noted. No radiopaque foreign body is seen. IMPRESSION: Focal soft tissue swelling consistent with the given clinical history. No acute bony abnormality is noted. Electronically Signed   By: Alcide CleverMark  Lukens M.D.   On: 04/01/2018 20:45    Procedures .Marland Kitchen.Incision and Drainage Date/Time: 04/02/2018 1:50 AM Performed by: Melene PlanFloyd, Gigi Onstad, DO Authorized by: Melene PlanFloyd, Elder Davidian, DO   Consent:    Consent obtained:  Verbal   Consent given by:  Patient   Risks discussed:  Bleeding, incomplete drainage and infection   Alternatives discussed:  No treatment and delayed treatment Location:    Type:  Abscess   Location:  Upper extremity   Upper extremity location:  Finger   Finger location:  R small finger Pre-procedure details:    Skin preparation:  Chloraprep Anesthesia (see MAR for exact dosages):    Anesthesia method:  Nerve block   Block needle gauge:  27 G   Block anesthetic:  Lidocaine 1% WITH epi   Block injection procedure:  Anatomic landmarks identified and anatomic  landmarks palpated   Block outcome:  Anesthesia achieved Procedure type:    Complexity:  Complex Procedure details:    Needle aspiration: no     Incision types:  Stab incision   Incision depth:  Subcutaneous   Scalpel blade:  11   Wound management:  Probed and deloculated   Drainage:  Bloody and purulent   Drainage amount:  Moderate   Wound treatment:  Wound left open   Packing materials:  None Post-procedure details:    Patient tolerance of procedure:  Tolerated well, no immediate complications   (including critical care time)  Medications Ordered  in ED Medications  lidocaine-EPINEPHrine (XYLOCAINE W/EPI) 2 %-1:100000 (with pres) injection 20 mL (20 mLs Infiltration Not Given 04/02/18 0059)  lidocaine-EPINEPHrine (XYLOCAINE W/EPI) 1 %-1:100000 (with pres) injection 20 mL (20 mLs Intradermal Not Given 04/02/18 0100)  lidocaine-EPINEPHrine (XYLOCAINE W/EPI) 2 %-1:200000 (PF) injection 20 mL (20 mLs Intradermal Given 04/02/18 0059)     Initial Impression / Assessment and Plan / ED Course  I have reviewed the triage vital signs and the nursing notes.  Pertinent labs & imaging results that were available during my care of the patient were reviewed by me and considered in my medical decision making (see chart for details).     68 yo F with a chief complaint of pain and swelling to the right fifth digit.  Clinically the patient has a felon likely secondary to a paronychia.  Will attempt I&D at bedside.  D/c home.   1:51 AM:  I have discussed the diagnosis/risks/treatment options with the patient and believe the pt to be eligible for discharge home to follow-up with PCP. We also discussed returning to the ED immediately if new or worsening sx occur. We discussed the sx which are most concerning (e.g., sudden worsening pain, fever, inability to tolerate by mouth) that necessitate immediate return. Medications administered to the patient during their visit and any new prescriptions provided  to the patient are listed below.  Medications given during this visit Medications  lidocaine-EPINEPHrine (XYLOCAINE W/EPI) 2 %-1:100000 (with pres) injection 20 mL (20 mLs Infiltration Not Given 04/02/18 0059)  lidocaine-EPINEPHrine (XYLOCAINE W/EPI) 1 %-1:100000 (with pres) injection 20 mL (20 mLs Intradermal Not Given 04/02/18 0100)  lidocaine-EPINEPHrine (XYLOCAINE W/EPI) 2 %-1:200000 (PF) injection 20 mL (20 mLs Intradermal Given 04/02/18 0059)     The patient appears reasonably screen and/or stabilized for discharge and I doubt any other medical condition or other [facility] requiring further screening, evaluation, or treatment in the ED at this time prior to discharge.    Final Clinical Impressions(s) / ED Diagnoses   Final diagnoses:  Paronychia of right little finger    ED Discharge Orders         Ordered    doxycycline (VIBRAMYCIN) 100 MG capsule  2 times daily     04/02/18 0145           Melene Plan, DO 04/02/18 0151

## 2018-04-02 NOTE — Discharge Instructions (Signed)
Follow up with your family doc.  Return for worsening redness, fever

## 2018-04-09 ENCOUNTER — Ambulatory Visit (INDEPENDENT_AMBULATORY_CARE_PROVIDER_SITE_OTHER): Payer: Medicare HMO | Admitting: Emergency Medicine

## 2018-04-09 ENCOUNTER — Other Ambulatory Visit: Payer: Self-pay

## 2018-04-09 ENCOUNTER — Encounter: Payer: Self-pay | Admitting: Emergency Medicine

## 2018-04-09 VITALS — BP 138/85 | HR 69 | Temp 98.4°F | Resp 16 | Ht 63.0 in | Wt 130.0 lb

## 2018-04-09 DIAGNOSIS — Z23 Encounter for immunization: Secondary | ICD-10-CM

## 2018-04-09 DIAGNOSIS — L03011 Cellulitis of right finger: Secondary | ICD-10-CM | POA: Diagnosis not present

## 2018-04-09 NOTE — Patient Instructions (Addendum)
If you have lab work done today you will be contacted with your lab results within the next 2 weeks.  If you have not heard from [place] then please contact us. The fastest way to get your results is to register for My Chart.   IF you received an x-ray today, you will receive an invoice from [facility]. Please contact [facility] at [phone] with questions or concerns regarding your invoice.   IF you received labwork today, you will receive an invoice from [place]. Please contact [facility] at [phone] with questions or concerns regarding your invoice.   Our billing staff will not be able to assist you with questions regarding bills from these companies.  You will be contacted with the lab results as soon as they are available. The fastest way to get your results is to activate your My Chart account. Instructions are located on the last page of this paperwork. If you have not heard from [place] regarding the results in 2 weeks, please contact this office.     Paronychia Paronychia is an infection of the skin. It happens near a fingernail or toenail. It may cause pain and swelling around the nail. In some cases, a fluid-filled bump (abscess) can form near or under the nail. Usually, this condition is not serious, and it clears up with treatment. Follow these instructions at home: Wound care  Keep the affected area clean.  Soak the fingers or toes in warm water as told by your doctor. You may be told to do this for 20 minutes, 2-3 times a day.  Keep the area dry when you are not soaking it.  Do not try to drain a fluid-filled bump on your own.  Follow instructions from your doctor about how to take care of the affected area. Make sure you: ? Wash your hands with soap and water before you change your bandage (dressing). If you cannot use soap and water, use hand sanitizer. ? Change your bandage as told by your doctor.  If you had a fluid-filled bump and your doctor  drained it, check the area every day for signs of infection. Check for: ? Redness, swelling, or pain. ? Fluid or blood. ? Warmth. ? Pus or a bad smell. Medicines   Take over-the-counter and prescription medicines only as told by your doctor.  If you were prescribed an antibiotic medicine, take it as told by your doctor. Do not stop taking it even if you start to feel better. General instructions  Avoid touching any chemicals.  Do not pick at the affected area. Prevention  To prevent this condition from happening again: ? Wear rubber gloves when putting your hands in water for washing dishes or other tasks. ? Wear gloves if your hands might touch cleaners or chemicals. ? Avoid injuring your nails or fingertips. ? Do not bite your nails or tear hangnails. ? Do not cut your nails very short. ? Do not cut the skin at the base and sides of the nail (cuticles). ? Use clean nail clippers or scissors when trimming nails. Contact a doctor if:  You feel worse.  You do not get better.  You have more fluid, blood, or pus coming from the affected area.  Your finger or knuckle is swollen or is hard to move. Get help right away if you have:  A fever or chills.  Redness spreading from the affected area.  Pain in a joint or muscle. Summary  Paronychia is an infection  infection of the skin. It happens near a fingernail or toenail.  This condition may cause pain and swelling around the nail.  Soak the fingers or toes in warm water as told by your doctor.  Usually, this condition is not serious, and it clears up with treatment. This information is not intended to replace advice given to you by your health care provider. Make sure you discuss any questions you have with your health care provider. Document Released: 02/26/2009 Document Revised: 03/23/2017 Document Reviewed: 03/23/2017 Elsevier Interactive Patient Education  2019 Elsevier Inc.  

## 2018-04-09 NOTE — Progress Notes (Signed)
Alyssa Peterson 68 y.o.   Chief Complaint  Patient presents with  . paronychia    FOLLOW UP  patient seen at ED 04/01/2018 - RIGHT 5TH FINGER    HISTORY OF PRESENT ILLNESS: This is a 68 y.o. female here for follow-up of paronychia of right little finger status post I&D in the emergency department on 04/01/2018.  Doing well.  No complaints or medical concerns today.  HPI   Prior to Admission medications   Medication Sig Start Date End Date Taking? Authorizing Provider  calcium carbonate (TUMS - DOSED IN MG ELEMENTAL CALCIUM) 500 MG chewable tablet Chew 1 tablet by mouth daily.   Yes [provider]  doxycycline (VIBRAMYCIN) 100 MG capsule Take 1 capsule (100 mg total) by mouth 2 (two) times daily. 04/02/18  Yes Melene Plan, DO  clotrimazole (LOTRIMIN) 1 % cream Apply 1 application topically 2 (two) times daily. 12/10/16   Doristine Bosworth, MD  loratadine (CLARITIN) 10 MG tablet Take 10 mg by mouth daily.    [provider]    Allergies  Allergen Reactions  . Penicillins Hives    Patient Active Problem List   Diagnosis Date Noted  . Family history of colonic polyps 04/04/2016    Past Medical History:  Diagnosis Date  . Anemia   . GERD (gastroesophageal reflux disease)   . Inguinal hernia    left    Past Surgical History:  Procedure Laterality Date  . CYST REMOVAL NECK    . DENTAL SURGERY    . FEMORAL HERNIA REPAIR Left 07/17/2016   Procedure: LEFT FEMORAL  HERNIA REPAIR WITH MESH;  Surgeon: Manus Rudd, MD;  Location: [facility];  Service: General;  Laterality: Left;    Social History   Socioeconomic History  . Marital status: Divorced    Spouse name: Not on file  . Number of children: Not on file  . Years of education: Not on file  . Highest education level: Not on file  Occupational History  . Not on file  Social Needs  . Financial resource strain: Not on file  . Food insecurity:    Worry: Not on file    Inability: Not on  file  . Transportation needs:    Medical: Not on file    Non-medical: Not on file  Tobacco Use  . Smoking status: Never Smoker  . Smokeless tobacco: Never Used  Substance and Sexual Activity  . Alcohol use: No    Alcohol/week: 0.0 standard drinks  . Drug use: No  . Sexual activity: Not on file  Lifestyle  . Physical activity:    Days per week: Not on file    Minutes per session: Not on file  . Stress: Not on file  Relationships  . Social connections:    Talks on phone: Not on file    Gets together: Not on file    Attends religious service: Not on file    Active member of club or organization: Not on file    Attends meetings of clubs or organizations: Not on file    Relationship status: Not on file  . Intimate partner violence:    Fear of current or ex partner: Not on file    Emotionally abused: Not on file    Physically abused: Not on file    Forced sexual activity: Not on file  Other Topics Concern  . Not on file  Social History Narrative  . Not on file    No  family history on file.   Review of Systems  Constitutional: Negative.  Negative for chills and fever.  Respiratory: Negative.  Negative for shortness of breath.   Cardiovascular: Negative for chest pain and palpitations.  Gastrointestinal: Negative.  Negative for diarrhea, nausea and vomiting.  Genitourinary: Negative.   Musculoskeletal: Negative.  Negative for myalgias and neck pain.  Skin: Negative.  Negative for rash.  Neurological: Negative.  Negative for dizziness and headaches.  Endo/Heme/Allergies: Negative.   All other systems reviewed and are negative.    Physical Exam Vitals signs reviewed.  Constitutional:      Appearance: Normal appearance.  HENT:     Head: Normocephalic and atraumatic.     Nose: Nose normal.  Eyes:     Extraocular Movements: Extraocular movements intact.     Pupils: Pupils are equal, round, and reactive to light.  Neck:     Musculoskeletal: Normal range of motion.    Cardiovascular:     Rate and Rhythm: Normal rate.  Pulmonary:     Effort: Pulmonary effort is normal.  Musculoskeletal: Normal range of motion.  Skin:    General: Skin is warm and dry.     Capillary Refill: Capillary refill takes less than 2 seconds.     Comments: Right fifth finger: Status post paronychia drainage, healing well, no signs of infection, nontender, mild swelling, full range of motion, NVI.  Neurological:     General: No focal deficit present.     Mental Status: She is alert and oriented to person, place, and time.  Psychiatric:        Mood and Affect: Mood normal.        Behavior: Behavior normal.        ASSESSMENT & PLAN: Alyssa Peterson was seen today for paronychia.  Diagnoses and all orders for this visit:  Paronychia of finger of right hand Comments: Much improved    Patient Instructions       If you have lab work done today you will be contacted with your lab results within the next 2 weeks.  If you have not heard from us then please contact us. The fastest way to get your results is to register for My Chart.   IF you received an x-ray today, you will receive an invoice from [facility]. Please contact [facility] at [phone] with questions or concerns regarding your invoice.   IF you received labwork today, you will receive an invoice from [facility]. Please contact [facility] at [phone] with questions or concerns regarding your invoice.   Our billing staff will not be able to assist you with questions regarding bills from these companies.  You will be contacted with the lab results as soon as they are available. The fastest way to get your results is to activate your My Chart account. Instructions are located on the last page of this paperwork. If you have not heard from us regarding the results in 2 weeks, please contact this office.     Paronychia Paronychia is an infection of the skin. It happens near a fingernail or  toenail. It may cause pain and swelling around the nail. In some cases, a fluid-filled bump (abscess) can form near or under the nail. Usually, this condition is not serious, and it clears up with treatment. Follow these instructions at home: Wound care  Keep the affected area clean.  Soak the fingers or toes in warm water as told by your doctor. You may be told to do this for 20  minutes, 2-3 times a day.  Keep the area dry when you are not soaking it.  Do not try to drain a fluid-filled bump on your own.  Follow instructions from your doctor about how to take care of the affected area. Make sure you: ? Wash your hands with soap and water before you change your bandage (dressing). If you cannot use soap and water, use hand sanitizer. ? Change your bandage as told by your doctor.  If you had a fluid-filled bump and your doctor drained it, check the area every day for signs of infection. Check for: ? Redness, swelling, or pain. ? Fluid or blood. ? Warmth. ? Pus or a bad smell. Medicines   Take over-the-counter and prescription medicines only as told by your doctor.  If you were prescribed an antibiotic medicine, take it as told by your doctor. Do not stop taking it even if you start to feel better. General instructions  Avoid touching any chemicals.  Do not pick at the affected area. Prevention  To prevent this condition from happening again: ? Wear rubber gloves when putting your hands in water for washing dishes or other tasks. ? Wear gloves if your hands might touch cleaners or chemicals. ? Avoid injuring your nails or fingertips. ? Do not bite your nails or tear hangnails. ? Do not cut your nails very short. ? Do not cut the skin at the base and sides of the nail (cuticles). ? Use clean nail clippers or scissors when trimming nails. Contact a doctor if:  You feel worse.  You do not get better.  You have more fluid, blood, or pus coming from the affected  area.  Your finger or knuckle is swollen or is hard to move. Get help right away if you have:  A fever or chills.  Redness spreading from the affected area.  Pain in a joint or muscle. Summary  Paronychia is an infection of the skin. It happens near a fingernail or toenail.  This condition may cause pain and swelling around the nail.  Soak the fingers or toes in warm water as told by your doctor.  Usually, this condition is not serious, and it clears up with treatment. This information is not intended to replace advice given to you by your health care provider. Make sure you discuss any questions you have with your health care provider. Document Released: 02/26/2009 Document Revised: 03/23/2017 Document Reviewed: 03/23/2017 Elsevier Interactive Patient Education  2019 Elsevier Inc.      Edwina BarthMiguel Obrian Bulson, MD [facility]

## 2019-05-25 ENCOUNTER — Telehealth: Payer: Self-pay | Admitting: *Deleted

## 2019-05-25 NOTE — Telephone Encounter (Signed)
Schedule AWV.  

## 2019-11-07 ENCOUNTER — Other Ambulatory Visit: Payer: Self-pay

## 2019-11-07 ENCOUNTER — Ambulatory Visit (HOSPITAL_COMMUNITY)
Admission: EM | Admit: 2019-11-07 | Discharge: 2019-11-07 | Disposition: A | Discharge status: Home / Self Care | Payer: Medicare Other | Attending: Family Medicine | Admitting: Family Medicine

## 2019-11-07 ENCOUNTER — Encounter (HOSPITAL_COMMUNITY): Payer: Self-pay

## 2019-11-07 DIAGNOSIS — R1013 Epigastric pain: Secondary | ICD-10-CM

## 2019-11-07 DIAGNOSIS — Z20822 Contact with and (suspected) exposure to covid-19: Secondary | ICD-10-CM | POA: Insufficient documentation

## 2019-11-07 DIAGNOSIS — K219 Gastro-esophageal reflux disease without esophagitis: Secondary | ICD-10-CM | POA: Insufficient documentation

## 2019-11-07 DIAGNOSIS — Z880 Allergy status to penicillin: Secondary | ICD-10-CM | POA: Insufficient documentation

## 2019-11-07 DIAGNOSIS — R112 Nausea with vomiting, unspecified: Secondary | ICD-10-CM

## 2019-11-07 DIAGNOSIS — K0889 Other specified disorders of teeth and supporting structures: Secondary | ICD-10-CM

## 2019-11-07 DIAGNOSIS — Z79899 Other long term (current) drug therapy: Secondary | ICD-10-CM | POA: Diagnosis not present

## 2019-11-07 LAB — SARS CORONAVIRUS 2 (TAT 6-24 HRS): SARS Coronavirus 2: NEGATIVE

## 2019-11-07 MED ORDER — FAMOTIDINE 20 MG PO TABS: 20.0000 mg | ORAL_TABLET | Freq: Two times a day (BID) | ORAL | 0 refills | Status: DC

## 2019-11-07 MED ORDER — CETIRIZINE HCL 10 MG PO TABS: 10.0000 mg | ORAL_TABLET | Freq: Every day | ORAL | 0 refills | Status: DC

## 2019-11-07 NOTE — Discharge Instructions (Addendum)
Take Zyrtec and Pepcid for the next week  Use sensodyne on the sore area of your tooth and gums  Follow up with allergist or GI if abdominal symptoms are not improving  Follow up with dentist as needed

## 2019-11-07 NOTE — ED Triage Notes (Signed)
Pt presents with abdominal pain and vomits since yesterday after eating spaghetti. Pt think she may have a "food allergies".
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
## 2020-03-03 ENCOUNTER — Other Ambulatory Visit: Payer: Self-pay

## 2020-03-03 ENCOUNTER — Ambulatory Visit (HOSPITAL_COMMUNITY)
Admission: EM | Admit: 2020-03-03 | Discharge: 2020-03-03 | Disposition: A | Discharge status: Home / Self Care | Payer: Medicare Other | Attending: Family Medicine | Admitting: Family Medicine

## 2020-03-03 DIAGNOSIS — Z1152 Encounter for screening for COVID-19: Secondary | ICD-10-CM | POA: Diagnosis not present

## 2020-03-03 NOTE — ED Triage Notes (Signed)
Pt present covid exposure from a child at work. Pt state she is not having any symptoms but would like to be tested. Pt was informed of exposure yesterday.

## 2020-03-03 NOTE — Discharge Instructions (Signed)

## 2020-03-03 NOTE — ED Notes (Signed)

## 2020-03-04 LAB — SARS CORONAVIRUS 2 (TAT 6-24 HRS): SARS Coronavirus 2: NEGATIVE

## 2020-04-05 DIAGNOSIS — U071 COVID-19: Secondary | ICD-10-CM | POA: Diagnosis not present

## 2020-04-05 DIAGNOSIS — J069 Acute upper respiratory infection, unspecified: Secondary | ICD-10-CM | POA: Diagnosis not present

## 2020-07-04 ENCOUNTER — Emergency Department (HOSPITAL_COMMUNITY): Payer: Medicare Other

## 2020-07-04 ENCOUNTER — Encounter (HOSPITAL_COMMUNITY): Payer: Self-pay

## 2020-07-04 ENCOUNTER — Other Ambulatory Visit: Payer: Self-pay

## 2020-07-04 ENCOUNTER — Emergency Department (HOSPITAL_COMMUNITY): Payer: Medicare Other | Admitting: Anesthesiology

## 2020-07-04 ENCOUNTER — Encounter (HOSPITAL_COMMUNITY): Admission: EM | Disposition: A | Discharge status: Home / Self Care | Payer: Self-pay | Source: Home / Self Care

## 2020-07-04 ENCOUNTER — Inpatient Hospital Stay (HOSPITAL_COMMUNITY)
Admission: EM | Admit: 2020-07-04 | Discharge: 2020-07-10 | DRG: 326 | Disposition: A | Discharge status: Home / Self Care | Payer: Medicare Other | Attending: Physician Assistant | Admitting: Physician Assistant

## 2020-07-04 DIAGNOSIS — K449 Diaphragmatic hernia without obstruction or gangrene: Secondary | ICD-10-CM | POA: Diagnosis not present

## 2020-07-04 DIAGNOSIS — D62 Acute posthemorrhagic anemia: Secondary | ICD-10-CM | POA: Diagnosis not present

## 2020-07-04 DIAGNOSIS — Z01818 Encounter for other preprocedural examination: Secondary | ICD-10-CM | POA: Diagnosis not present

## 2020-07-04 DIAGNOSIS — K255 Chronic or unspecified gastric ulcer with perforation: Secondary | ICD-10-CM | POA: Diagnosis not present

## 2020-07-04 DIAGNOSIS — D649 Anemia, unspecified: Secondary | ICD-10-CM | POA: Diagnosis not present

## 2020-07-04 DIAGNOSIS — Z91040 Latex allergy status: Secondary | ICD-10-CM | POA: Diagnosis not present

## 2020-07-04 DIAGNOSIS — K440 Diaphragmatic hernia with obstruction, without gangrene: Secondary | ICD-10-CM | POA: Diagnosis present

## 2020-07-04 DIAGNOSIS — K658 Other peritonitis: Secondary | ICD-10-CM | POA: Diagnosis present

## 2020-07-04 DIAGNOSIS — Z431 Encounter for attention to gastrostomy: Secondary | ICD-10-CM | POA: Diagnosis not present

## 2020-07-04 DIAGNOSIS — R198 Other specified symptoms and signs involving the digestive system and abdomen: Secondary | ICD-10-CM | POA: Diagnosis present

## 2020-07-04 DIAGNOSIS — I10 Essential (primary) hypertension: Secondary | ICD-10-CM | POA: Diagnosis not present

## 2020-07-04 DIAGNOSIS — K562 Volvulus: Secondary | ICD-10-CM | POA: Diagnosis present

## 2020-07-04 DIAGNOSIS — R6889 Other general symptoms and signs: Secondary | ICD-10-CM | POA: Diagnosis not present

## 2020-07-04 DIAGNOSIS — Z79899 Other long term (current) drug therapy: Secondary | ICD-10-CM | POA: Diagnosis not present

## 2020-07-04 DIAGNOSIS — Z20822 Contact with and (suspected) exposure to covid-19: Secondary | ICD-10-CM | POA: Diagnosis not present

## 2020-07-04 DIAGNOSIS — K3189 Other diseases of stomach and duodenum: Secondary | ICD-10-CM | POA: Diagnosis not present

## 2020-07-04 DIAGNOSIS — K219 Gastro-esophageal reflux disease without esophagitis: Secondary | ICD-10-CM | POA: Diagnosis not present

## 2020-07-04 DIAGNOSIS — K625 Hemorrhage of anus and rectum: Secondary | ICD-10-CM | POA: Diagnosis not present

## 2020-07-04 DIAGNOSIS — Z880 Allergy status to penicillin: Secondary | ICD-10-CM | POA: Diagnosis not present

## 2020-07-04 DIAGNOSIS — R1111 Vomiting without nausea: Secondary | ICD-10-CM | POA: Diagnosis not present

## 2020-07-04 DIAGNOSIS — R109 Unspecified abdominal pain: Secondary | ICD-10-CM | POA: Diagnosis not present

## 2020-07-04 DIAGNOSIS — S3630XA Unspecified injury of stomach, initial encounter: Secondary | ICD-10-CM

## 2020-07-04 DIAGNOSIS — Z743 Need for continuous supervision: Secondary | ICD-10-CM | POA: Diagnosis not present

## 2020-07-04 HISTORY — PX: LAPAROTOMY: SHX154

## 2020-07-04 HISTORY — PX: GASTROJEJUNOSTOMY: SHX1697

## 2020-07-04 LAB — LIPASE, BLOOD: Lipase: 23 U/L (ref 11–51)

## 2020-07-04 LAB — COMPREHENSIVE METABOLIC PANEL
ALT: 17 U/L (ref 0–44)
AST: 24 U/L (ref 15–41)
Albumin: 4.1 g/dL (ref 3.5–5.0)
Alkaline Phosphatase: 76 U/L (ref 38–126)
Anion gap: 12 (ref 5–15)
BUN: 17 mg/dL (ref 8–23)
CO2: 26 mmol/L (ref 22–32)
Calcium: 10 mg/dL (ref 8.9–10.3)
Chloride: 105 mmol/L (ref 98–111)
Creatinine, Ser: 1.08 mg/dL — ABNORMAL HIGH (ref 0.44–1.00)
GFR, Estimated: 56 mL/min — ABNORMAL LOW (ref >60)
Glucose, Bld: 220 mg/dL — ABNORMAL HIGH (ref 70–99)
Potassium: 3.4 mmol/L — ABNORMAL LOW (ref 3.5–5.1)
Sodium: 143 mmol/L (ref 135–145)
Total Bilirubin: 0.9 mg/dL (ref 0.3–1.2)
Total Protein: 7.7 g/dL (ref 6.5–8.1)

## 2020-07-04 LAB — CBC WITH DIFFERENTIAL/PLATELET
Abs Immature Granulocytes: 0.05 10*3/uL (ref 0.00–0.07)
Basophils Absolute: 0 10*3/uL (ref 0.0–0.1)
Basophils Relative: 0 %
Eosinophils Absolute: 0 10*3/uL (ref 0.0–0.5)
Eosinophils Relative: 0 %
HCT: 47.7 % — ABNORMAL HIGH (ref 36.0–46.0)
Hemoglobin: 15.3 g/dL — ABNORMAL HIGH (ref 12.0–15.0)
Immature Granulocytes: 1 %
Lymphocytes Relative: 7 %
Lymphs Abs: 0.6 10*3/uL — ABNORMAL LOW (ref 0.7–4.0)
MCH: 29.3 pg (ref 26.0–34.0)
MCHC: 32.1 g/dL (ref 30.0–36.0)
MCV: 91.4 fL (ref 80.0–100.0)
Monocytes Absolute: 0.3 10*3/uL (ref 0.1–1.0)
Monocytes Relative: 3 %
Neutro Abs: 7.2 10*3/uL (ref 1.7–7.7)
Neutrophils Relative %: 89 %
Platelets: 265 10*3/uL (ref 150–400)
RBC: 5.22 MIL/uL — ABNORMAL HIGH (ref 3.87–5.11)
RDW: 13.5 % (ref 11.5–15.5)
WBC: 8.2 10*3/uL (ref 4.0–10.5)
nRBC: 0 % (ref 0.0–0.2)

## 2020-07-04 LAB — LACTIC ACID, PLASMA: Lactic Acid, Venous: 4.1 mmol/L (ref 0.5–1.9)

## 2020-07-04 LAB — RESP PANEL BY RT-PCR (FLU A&B, COVID) ARPGX2
Influenza A by PCR: NEGATIVE
Influenza B by PCR: NEGATIVE
SARS Coronavirus 2 by RT PCR: NEGATIVE

## 2020-07-04 SURGERY — LAPAROTOMY, EXPLORATORY
Anesthesia: General | Site: Abdomen

## 2020-07-04 MED ORDER — METOCLOPRAMIDE HCL 5 MG/ML IJ SOLN: 10.0000 mg | Freq: Four times a day (QID) | INTRAMUSCULAR | Status: DC | PRN

## 2020-07-04 MED ORDER — SUGAMMADEX SODIUM 200 MG/2ML IV SOLN
INTRAVENOUS | Status: DC | PRN
  Administered 2020-07-04: 150 mg via INTRAVENOUS

## 2020-07-04 MED ORDER — SODIUM CHLORIDE 0.9 % IV SOLN
8.0000 mg/h | INTRAVENOUS | Status: DC
  Filled 2020-07-04: qty 80

## 2020-07-04 MED ORDER — HYDROMORPHONE HCL 1 MG/ML IJ SOLN
0.5000 mg | INTRAMUSCULAR | Status: DC | PRN
  Administered 2020-07-06: 1 mg via INTRAVENOUS
  Filled 2020-07-04: qty 1

## 2020-07-04 MED ORDER — SUCCINYLCHOLINE CHLORIDE 200 MG/10ML IV SOSY
PREFILLED_SYRINGE | INTRAVENOUS | Status: DC | PRN
  Administered 2020-07-04: 120 mg via INTRAVENOUS

## 2020-07-04 MED ORDER — SODIUM CHLORIDE 0.9 % IV BOLUS
1000.0000 mL | Freq: Once | INTRAVENOUS | Status: AC
  Administered 2020-07-04: 1000 mL via INTRAVENOUS

## 2020-07-04 MED ORDER — METHOCARBAMOL 1000 MG/10ML IJ SOLN
500.0000 mg | Freq: Four times a day (QID) | INTRAVENOUS | Status: DC | PRN
  Filled 2020-07-04: qty 5

## 2020-07-04 MED ORDER — PANTOPRAZOLE SODIUM 40 MG IV SOLR
40.0000 mg | Freq: Two times a day (BID) | INTRAVENOUS | Status: DC
  Administered 2020-07-08: 40 mg via INTRAVENOUS
  Filled 2020-07-04 (×2): qty 40

## 2020-07-04 MED ORDER — ONDANSETRON 4 MG PO TBDP
4.0000 mg | ORAL_TABLET | Freq: Four times a day (QID) | ORAL | Status: DC | PRN
  Filled 2020-07-04: qty 1

## 2020-07-04 MED ORDER — HYDRALAZINE HCL 20 MG/ML IJ SOLN: 10.0000 mg | INTRAMUSCULAR | Status: DC | PRN

## 2020-07-04 MED ORDER — SODIUM CHLORIDE 0.9 % IV SOLN
80.0000 mg | Freq: Once | INTRAVENOUS | Status: AC
  Administered 2020-07-04: 80 mg via INTRAVENOUS
  Filled 2020-07-04: qty 80

## 2020-07-04 MED ORDER — HYDROMORPHONE HCL 1 MG/ML IJ SOLN
0.5000 mg | INTRAMUSCULAR | Status: DC | PRN
  Administered 2020-07-04: 1 mg via INTRAVENOUS

## 2020-07-04 MED ORDER — FENTANYL CITRATE (PF) 250 MCG/5ML IJ SOLN
INTRAMUSCULAR | Status: AC
  Filled 2020-07-04: qty 5

## 2020-07-04 MED ORDER — MIDAZOLAM HCL 2 MG/2ML IJ SOLN
INTRAMUSCULAR | Status: AC
  Filled 2020-07-04: qty 2

## 2020-07-04 MED ORDER — HYDROMORPHONE HCL 1 MG/ML IJ SOLN
INTRAMUSCULAR | Status: AC
  Filled 2020-07-04: qty 1

## 2020-07-04 MED ORDER — FENTANYL CITRATE (PF) 100 MCG/2ML IJ SOLN
50.0000 ug | Freq: Once | INTRAMUSCULAR | Status: AC
  Administered 2020-07-04: 50 ug via INTRAVENOUS
  Filled 2020-07-04: qty 2

## 2020-07-04 MED ORDER — ONDANSETRON HCL 4 MG/2ML IJ SOLN: 4.0000 mg | Freq: Four times a day (QID) | INTRAMUSCULAR | Status: DC | PRN

## 2020-07-04 MED ORDER — FENTANYL CITRATE (PF) 250 MCG/5ML IJ SOLN
INTRAMUSCULAR | Status: DC | PRN
  Administered 2020-07-04: 100 ug via INTRAVENOUS
  Administered 2020-07-04: 50 ug via INTRAVENOUS

## 2020-07-04 MED ORDER — ONDANSETRON HCL 4 MG/2ML IJ SOLN
INTRAMUSCULAR | Status: DC | PRN
  Administered 2020-07-04: 4 mg via INTRAVENOUS

## 2020-07-04 MED ORDER — DEXAMETHASONE SODIUM PHOSPHATE 10 MG/ML IJ SOLN
INTRAMUSCULAR | Status: DC | PRN
  Administered 2020-07-04: 10 mg via INTRAVENOUS

## 2020-07-04 MED ORDER — PROPOFOL 10 MG/ML IV BOLUS
INTRAVENOUS | Status: DC | PRN
  Administered 2020-07-04: 100 mg via INTRAVENOUS

## 2020-07-04 MED ORDER — PANTOPRAZOLE SODIUM 40 MG IV SOLR: 40.0000 mg | Freq: Two times a day (BID) | INTRAVENOUS | Status: DC

## 2020-07-04 MED ORDER — SODIUM CHLORIDE 0.9 % IV SOLN: INTRAVENOUS | Status: DC

## 2020-07-04 MED ORDER — SODIUM CHLORIDE 0.9 % IV SOLN
2.0000 g | INTRAVENOUS | Status: DC
  Administered 2020-07-04 – 2020-07-07 (×5): 2 g via INTRAVENOUS
  Filled 2020-07-04 (×3): qty 2
  Filled 2020-07-04 (×2): qty 20

## 2020-07-04 MED ORDER — SODIUM CHLORIDE 0.9 % IV SOLN
80.0000 mg | Freq: Once | INTRAVENOUS | Status: DC
  Filled 2020-07-04: qty 80

## 2020-07-04 MED ORDER — PHENYLEPHRINE HCL-NACL 10-0.9 MG/250ML-% IV SOLN
INTRAVENOUS | Status: DC | PRN
  Administered 2020-07-04: 15 ug/min via INTRAVENOUS

## 2020-07-04 MED ORDER — METHOCARBAMOL 1000 MG/10ML IJ SOLN
500.0000 mg | Freq: Four times a day (QID) | INTRAVENOUS | Status: DC | PRN
  Administered 2020-07-07 – 2020-07-08 (×3): 500 mg via INTRAVENOUS
  Filled 2020-07-04 (×2): qty 5
  Filled 2020-07-04: qty 500
  Filled 2020-07-04: qty 5

## 2020-07-04 MED ORDER — LIDOCAINE 2% (20 MG/ML) 5 ML SYRINGE
INTRAMUSCULAR | Status: DC | PRN
  Administered 2020-07-04: 100 mg via INTRAVENOUS

## 2020-07-04 MED ORDER — METRONIDAZOLE IN NACL 5-0.79 MG/ML-% IV SOLN
500.0000 mg | Freq: Three times a day (TID) | INTRAVENOUS | Status: DC
  Administered 2020-07-04 – 2020-07-08 (×11): 500 mg via INTRAVENOUS
  Filled 2020-07-04 (×11): qty 100

## 2020-07-04 MED ORDER — ENOXAPARIN SODIUM 40 MG/0.4ML ~~LOC~~ SOLN
40.0000 mg | SUBCUTANEOUS | Status: DC
  Administered 2020-07-05 – 2020-07-09 (×5): 40 mg via SUBCUTANEOUS
  Filled 2020-07-04 (×6): qty 0.4

## 2020-07-04 MED ORDER — PHENYLEPHRINE HCL (PRESSORS) 10 MG/ML IV SOLN
INTRAVENOUS | Status: DC | PRN
  Administered 2020-07-04 (×2): 40 ug via INTRAVENOUS

## 2020-07-04 MED ORDER — IOHEXOL 300 MG/ML  SOLN
100.0000 mL | Freq: Once | INTRAMUSCULAR | Status: AC | PRN
  Administered 2020-07-04: 100 mL via INTRAVENOUS

## 2020-07-04 MED ORDER — LACTATED RINGERS IV SOLN: INTRAVENOUS | Status: DC | PRN

## 2020-07-04 MED ORDER — SODIUM CHLORIDE 0.9 % IV SOLN: 80.0000 mg | Freq: Once | INTRAVENOUS | Status: DC

## 2020-07-04 MED ORDER — ACETAMINOPHEN 10 MG/ML IV SOLN
1000.0000 mg | Freq: Four times a day (QID) | INTRAVENOUS | Status: AC
  Administered 2020-07-05 (×4): 1000 mg via INTRAVENOUS
  Filled 2020-07-04 (×6): qty 100

## 2020-07-04 MED ORDER — DIPHENHYDRAMINE HCL 50 MG/ML IJ SOLN: 12.5000 mg | Freq: Four times a day (QID) | INTRAMUSCULAR | Status: DC | PRN

## 2020-07-04 MED ORDER — ROCURONIUM BROMIDE 10 MG/ML (PF) SYRINGE
PREFILLED_SYRINGE | INTRAVENOUS | Status: DC | PRN
  Administered 2020-07-04: 50 mg via INTRAVENOUS

## 2020-07-04 MED ORDER — DIPHENHYDRAMINE HCL 12.5 MG/5ML PO ELIX: 12.5000 mg | ORAL_SOLUTION | Freq: Four times a day (QID) | ORAL | Status: DC | PRN

## 2020-07-04 MED ORDER — SODIUM CHLORIDE 0.9 % IV SOLN
8.0000 mg/h | INTRAVENOUS | Status: AC
  Administered 2020-07-04 – 2020-07-07 (×6): 8 mg/h via INTRAVENOUS
  Filled 2020-07-04 (×9): qty 80

## 2020-07-04 MED ORDER — METOPROLOL TARTRATE 5 MG/5ML IV SOLN: 5.0000 mg | Freq: Four times a day (QID) | INTRAVENOUS | Status: DC | PRN

## 2020-07-04 MED ORDER — MIDAZOLAM HCL 2 MG/2ML IJ SOLN
INTRAMUSCULAR | Status: DC | PRN
  Administered 2020-07-04: 2 mg via INTRAVENOUS

## 2020-07-04 MED ORDER — ONDANSETRON HCL 4 MG/2ML IJ SOLN
4.0000 mg | Freq: Once | INTRAMUSCULAR | Status: AC
  Administered 2020-07-04: 4 mg via INTRAVENOUS
  Filled 2020-07-04: qty 2

## 2020-07-04 MED ORDER — MORPHINE SULFATE (PF) 4 MG/ML IV SOLN
4.0000 mg | Freq: Once | INTRAVENOUS | Status: AC
  Administered 2020-07-04: 4 mg via INTRAVENOUS
  Filled 2020-07-04: qty 1

## 2020-07-04 MED ORDER — ENOXAPARIN SODIUM 40 MG/0.4ML ~~LOC~~ SOLN: 40.0000 mg | SUBCUTANEOUS | Status: DC

## 2020-07-04 MED ORDER — SODIUM CHLORIDE 0.9 % IV SOLN: 8.0000 mg/h | INTRAVENOUS | Status: DC

## 2020-07-04 MED ORDER — 0.9 % SODIUM CHLORIDE (POUR BTL) OPTIME
TOPICAL | Status: DC | PRN
  Administered 2020-07-04: 2000 mL

## 2020-07-04 MED ORDER — PROPOFOL 10 MG/ML IV BOLUS
INTRAVENOUS | Status: AC
  Filled 2020-07-04: qty 20

## 2020-07-04 SURGICAL SUPPLY — 41 items
BINDER ABDOMINAL 12 ML 46-62 (SOFTGOODS) ×2 IMPLANT
CANISTER SUCT 3000ML PPV (MISCELLANEOUS) ×2 IMPLANT
CATH GASTROSTOMY 20FR (CATHETERS) ×2 IMPLANT
CHLORAPREP W/TINT 26 (MISCELLANEOUS) ×2 IMPLANT
COVER SURGICAL LIGHT HANDLE (MISCELLANEOUS) ×2 IMPLANT
DRAIN CHANNEL 19F RND (DRAIN) ×2 IMPLANT
DRAPE LAPAROSCOPIC ABDOMINAL (DRAPES) ×2 IMPLANT
DRAPE WARM FLUID 44X44 (DRAPES) ×2 IMPLANT
ELECT CAUTERY BLADE 6.4 (BLADE) ×2 IMPLANT
ELECT REM PT RETURN 9FT ADLT (ELECTROSURGICAL) ×2
ELECTRODE REM PT RTRN 9FT ADLT (ELECTROSURGICAL) ×1 IMPLANT
EVACUATOR SILICONE 100CC (DRAIN) ×2 IMPLANT
GAUZE SPONGE 4X4 12PLY STRL (GAUZE/BANDAGES/DRESSINGS) ×4 IMPLANT
GLOVE BIO SURGEON STRL SZ 6 (GLOVE) ×2 IMPLANT
GLOVE SURG POLYISO LF SZ6 (GLOVE) ×2 IMPLANT
GLOVE SURG UNDER LTX SZ6.5 (GLOVE) ×2 IMPLANT
GOWN STRL REUS W/ TWL LRG LVL3 (GOWN DISPOSABLE) ×2 IMPLANT
GOWN STRL REUS W/TWL LRG LVL3 (GOWN DISPOSABLE) ×4
HANDLE SUCTION POOLE (INSTRUMENTS) ×1 IMPLANT
KIT BASIN OR (CUSTOM PROCEDURE TRAY) ×2 IMPLANT
KIT TURNOVER KIT B (KITS) ×2 IMPLANT
LIGASURE IMPACT 36 18CM CVD LR (INSTRUMENTS) IMPLANT
NS IRRIG 1000ML POUR BTL (IV SOLUTION) ×4 IMPLANT
PACK GENERAL/GYN (CUSTOM PROCEDURE TRAY) ×2 IMPLANT
PAD ARMBOARD 7.5X6 YLW CONV (MISCELLANEOUS) ×2 IMPLANT
PENCIL SMOKE EVACUATOR (MISCELLANEOUS) ×2 IMPLANT
SPECIMEN JAR LARGE (MISCELLANEOUS) IMPLANT
SPONGE LAP 18X18 RF (DISPOSABLE) IMPLANT
STAPLER VISISTAT 35W (STAPLE) ×2 IMPLANT
SUCTION POOLE HANDLE (INSTRUMENTS) ×2
SUT ETHILON 2 0 FS 18 (SUTURE) ×4 IMPLANT
SUT PDS AB 1 TP1 96 (SUTURE) ×4 IMPLANT
SUT SILK 2 0 SH CR/8 (SUTURE) ×2 IMPLANT
SUT SILK 2 0 TIES 10X30 (SUTURE) ×2 IMPLANT
SUT SILK 3 0 SH CR/8 (SUTURE) ×2 IMPLANT
SUT SILK 3 0 TIES 10X30 (SUTURE) ×2 IMPLANT
SUT VIC AB 3-0 SH 18 (SUTURE) ×2 IMPLANT
SYR CONTROL 10ML LL (SYRINGE) ×2 IMPLANT
TAPE CLOTH SURG 6X10 WHT LF (GAUZE/BANDAGES/DRESSINGS) ×2 IMPLANT
TOWEL GREEN STERILE (TOWEL DISPOSABLE) ×2 IMPLANT
TRAY FOLEY MTR SLVR 16FR STAT (SET/KITS/TRAYS/PACK) ×2 IMPLANT

## 2020-07-04 NOTE — ED Notes (Signed)
General sx at bedside 

## 2020-07-04 NOTE — ED Provider Notes (Signed)
[facility] EMERGENCY DEPARTMENT Provider Note   CSN: [id number] Arrival date & time: 07/04/20  1308     History Chief Complaint  Patient presents with  . Abdominal Pain  . Emesis    Alyssa Peterson is a 70 y.o. female.  The history is provided by the patient. No language interpreter was used.  Abdominal Pain Associated symptoms: vomiting   Emesis Associated symptoms: abdominal pain      70 year old female significant history of anemia, GERD, inguinal hernia, brought here via EMS for evaluation of abdominal pain.  Patient report for the past several months she has been having intermittent bouts of nausea and vomiting.  However for the past several days she has increased nausea vomiting as well as having abdominal pain.  Pain is described as a crampy uncomfortable sensation throughout her abdomen.  Bowel movement has been normal.  Vomit is dark without blood.  She endorsed mild chills.  She does not claim any fever runny nose sneezing coughing chest pain shortness of breath dysuria hematuria.  She denies abnormal weight changes, but does endorse some night sweats.  No prior abdominal surgery aside from inguinal hernia with mesh.  She denies alcohol or tobacco abuse.  No recent sick contact.  Currently rates the pain as moderate in intensity.  She does not recall her last colonoscopy.  She does have a family history of colonic polyps.  Past Medical History:  Diagnosis Date  . Anemia   . GERD (gastroesophageal reflux disease)   . Inguinal hernia    left    Patient Active Problem List   Diagnosis Date Noted  . Family history of colonic polyps 04/04/2016    Past Surgical History:  Procedure Laterality Date  . CYST REMOVAL NECK    . DENTAL SURGERY    . FEMORAL HERNIA REPAIR Left 07/17/2016   Procedure: LEFT FEMORAL  HERNIA REPAIR WITH MESH;  Surgeon: Manus Rudd, MD;  Location: [facility];  Service: General;  Laterality: Left;     OB  History   No obstetric history on file.     No family history on file.  Social History   Tobacco Use  . Smoking status: Never Smoker  . Smokeless tobacco: Never Used  Substance Use Topics  . Alcohol use: No    Alcohol/week: 0.0 standard drinks  . Drug use: No    Home Medications Prior to Admission medications   Medication Sig Start Date End Date Taking? Authorizing Provider  calcium carbonate (TUMS - DOSED IN MG ELEMENTAL CALCIUM) 500 MG chewable tablet Chew 1 tablet by mouth daily.    [provider]  cetirizine (ZYRTEC ALLERGY) 10 MG tablet Take 1 tablet (10 mg total) by mouth daily. 11/07/19   Moshe Cipro, NP  clotrimazole (LOTRIMIN) 1 % cream Apply 1 application topically 2 (two) times daily. 12/10/16   Doristine Bosworth, MD  doxycycline (VIBRAMYCIN) 100 MG capsule Take 1 capsule (100 mg total) by mouth 2 (two) times daily. 04/02/18   Melene Plan, DO  famotidine (PEPCID) 20 MG tablet Take 1 tablet (20 mg total) by mouth 2 (two) times daily. 11/07/19   Moshe Cipro, NP  loratadine (CLARITIN) 10 MG tablet Take 10 mg by mouth daily.    [provider]    Allergies    Latex and Penicillins  Review of Systems   Review of Systems  Gastrointestinal: Positive for abdominal pain and vomiting.  All other systems reviewed and are negative.  Physical Exam Updated Vital Signs BP (!) 151/112 (BP Location: Left Arm)   Pulse 91   Temp 98.4 F (36.9 C) (Oral)   Resp 20   Ht 5\' 3"  (1.6 m)   Wt 59 kg   SpO2 100%   BMI 23.04 kg/m   Physical Exam Vitals and nursing note reviewed.  Constitutional:      General: She is not in acute distress.    Appearance: She is well-developed.  HENT:     Head: Atraumatic.  Eyes:     Conjunctiva/sclera: Conjunctivae normal.  Cardiovascular:     Rate and Rhythm: Normal rate and regular rhythm.     Pulses: Normal pulses.     Heart sounds: Normal heart sounds.  Pulmonary:     Effort: Pulmonary effort is normal.      Breath sounds: Normal breath sounds.  Abdominal:     Palpations: Abdomen is soft.     Tenderness: There is abdominal tenderness (Diffuse abdominal tenderness with guarding.  No rebound tenderness.).  Musculoskeletal:        General: Normal range of motion.     Cervical back: Neck supple.  Skin:    Findings: No rash.  Neurological:     Mental Status: She is alert and oriented to person, place, and time.  Psychiatric:        Mood and Affect: Mood normal.     ED Results / Procedures / Treatments   Labs (all labs ordered are listed, but only abnormal results are displayed) Labs Reviewed  CBC WITH DIFFERENTIAL/PLATELET  COMPREHENSIVE METABOLIC PANEL  LIPASE, BLOOD  URINALYSIS, ROUTINE W REFLEX MICROSCOPIC    EKG None  Radiology No results found.  Procedures Procedures   Medications Ordered in ED Medications - No data to display  ED Course  I have reviewed the triage vital signs and the nursing notes.  Pertinent labs & imaging results that were available during my care of the patient were reviewed by me and considered in my medical decision making (see chart for details).    MDM Rules/Calculators/A&P                          BP (!) 149/107   Pulse 84   Temp 98.4 F (36.9 C) (Oral)   Resp 18   Ht 5\' 3"  (1.6 m)   Wt 59 kg   SpO2 99%   BMI 23.04 kg/m   Final Clinical Impression(s) / ED Diagnoses Final diagnoses:  None    Rx / DC Orders ED Discharge Orders    None     1:28 PM Patient here with diffuse abdominal discomfort along with nausea and vomiting.  She does have a fairly tender abdomen on exam but nonfocal.  She would benefit from an abdominal pelvis CT scan for further evaluation.  Will provide symptomatic treatment which include antiemetic and opiate pain medication.  2:58 PM Pt sign out to oncoming provider who will f/u on CT result and reassessment.  Care discussed with Dr. .    , PA-C 07/04/20 1459    Fayrene Helper, MD 07/04/20 [phone]

## 2020-07-04 NOTE — Anesthesia Preprocedure Evaluation (Addendum)
Anesthesia Evaluation  Patient identified by MRN, date of birth, ID band Patient awake    Reviewed: Allergy & Precautions, NPO status , Patient's Chart, lab work & pertinent test results, Unable to perform ROS - Chart review onlyPreop documentation limited or incomplete due to emergent nature of procedure.  History of Anesthesia Complications Negative for: history of anesthetic complications  Airway Mallampati: II  TM Distance: >3 FB Neck ROM: Full    Dental  (+) Dental Advisory Given, Teeth Intact   Pulmonary neg pulmonary ROS,     + decreased breath sounds      Cardiovascular negative cardio ROS   Rhythm:Regular Rate:Normal     Neuro/Psych negative neurological ROS  negative psych ROS   GI/Hepatic Neg liver ROS, GERD  ,  Endo/Other  negative endocrine ROS  Renal/GU negative Renal ROS     Musculoskeletal negative musculoskeletal ROS (+)   Abdominal   Peds  Hematology  (+) Blood dyscrasia, anemia ,   Anesthesia Other Findings   Reproductive/Obstetrics negative OB ROS                            Anesthesia Physical  Anesthesia Plan  ASA: II and emergent  Anesthesia Plan: General   Post-op Pain Management:    Induction: Intravenous  PONV Risk Score and Plan: 4 or greater and Ondansetron, Dexamethasone, Treatment may vary due to age or medical condition, Diphenhydramine and Midazolam  Airway Management Planned: Oral ETT  Additional Equipment: None  Intra-op Plan:   Post-operative Plan: Possible Post-op intubation/ventilation  Informed Consent: I have reviewed the patients History and Physical, chart, labs and discussed the procedure including the risks, benefits and alternatives for the proposed anesthesia with the patient or authorized representative who has indicated his/her understanding and acceptance.     Dental advisory given  Plan Discussed with: CRNA  Anesthesia  Plan Comments:         Anesthesia Quick Evaluation

## 2020-07-04 NOTE — Progress Notes (Signed)
Pt arrived to 4E from PACU. CHG bath and foley care completed. New gown applied. Tele applied and CCMD notified. VS stable. Pt reports 2/10, stating she is just sore. Pt resting comfortably in bed at this time. Will continue to monitor.  Willa Frater, RN

## 2020-07-04 NOTE — Anesthesia Procedure Notes (Signed)
Procedure Name: Intubation Date/Time: 07/04/2020 5:54 PM Performed by: Dairl Ponder, CRNA Pre-anesthesia Checklist: Emergency Drugs available, Patient identified, Suction available, Patient being monitored and Timeout performed Patient Re-evaluated:Patient Re-evaluated prior to induction Oxygen Delivery Method: Circle system utilized Preoxygenation: Pre-oxygenation with 100% oxygen Induction Type: IV induction, Rapid sequence and Cricoid Pressure applied Laryngoscope Size: Glidescope and 3 Grade View: Grade I Tube type: Oral Tube size: 7.0 mm Number of attempts: 1 Airway Equipment and Method: Stylet Placement Confirmation: ETT inserted through vocal cords under direct vision,  positive ETCO2 and breath sounds checked- equal and bilateral Secured at: 22 cm Tube secured with: Tape Dental Injury: Teeth and Oropharynx as per pre-operative assessment

## 2020-07-04 NOTE — Op Note (Signed)
Operative Note  Alyssa Peterson  [id number]  [id number]  07/04/2020   Surgeon: Leeroy Bock ConnorMD  Procedure performed: Exploratory laparotomy, reduction of incarcerated paraesophageal hernia, repair of gastric perforation x2, placement of gastrostomy tube and JP drain  Preop diagnosis: Perforated viscus Post-op diagnosis/intraop findings: Incarcerated paraesophageal hernia with gastric volvulus and secondary perforation x 2 on the proximal aspect of the fundus/ greater curvature  Infection present at the time of surgery: Yes, diffuse feculent peritonitis Procedure status: Emergency  Specimens: no Retained items: 60 French round Blake drain, traverses along the lateral aspect of the stomach and the tip is in the mediastinum.  20 French MIC gastrostomy tube in left upper quadrant sits loosely at 4 cm at the skin. EBL: Minimal  Complications: none  Description of procedure: After obtaining informed consent the patient was taken to the operating room and placed supine on operating room table wheregeneral endotracheal anesthesia was initiated, preoperative antibiotics were administered, SCDs applied, and a formal timeout was performed.  The abdomen was prepped and draped in usual sterile fashion.  An upper midline laparotomy was created and the peritoneal cavity entered, immediately noting a rush of air and diffuse feculent peritonitis was encountered.  A Balfour retractor was placed.  Succus was aspirated.  The small bowel was examined and confirmed to be free of any perforation but did note diffuse serositis/peritonitis.  The stomach was nearly entirely herniated into the chest with volvulus.  This was reduced with gentle manual traction and normal anatomic configuration of the stomach restored.  Stomach is massively dilated.  There are 2 clean edged perforations along the proximal fundus, each about 7 mm in diameter.  These are approximately 5 cm apart. These were closed in 2 layers with  interrupted 3-0 Vicryl's and 3-0 silk Lembert sutures.  The remainder of the stomach appeared viable and there were no other perforations.  The abdomen was then lavaged with warm sterile saline and the effluent was clear.  A pursestring suture was placed along the greater curvature at least 5 cm distal to the site of the perforations and a gastrotomy created.  A 20 French MIC gastrostomy tube was inserted through the left abdominal wall and into the stomach through this gastrotomy.  The pursestring was then tied down and the balloon was inflated with 8 cc of water.  The gastrostomy was then Stammed to the abdominal wall with interrupted 2-0 silks x4.  The tube was then gently pulled back until the balloon rests loosely against the abdominal wall which is at approximately 4 cm at the skin.  The flange is secured to the skin with 3 interrupted 2 oh nylons.  A 19 French round Blake drain was then inserted through the right lower abdominal wall, this was brought around the lateral aspect of the stomach to oppose the sites of repair, and the tip was guided up into the mediastinum.  This was secured to the skin with a 2-0 nylon.  The fascia was then closed with looped #1 PDS starting at either end and tying centrally.  The wound was packed with a damp to dry dressing.  Gauze and tape dressings were applied.  The drain is placed to bulb suction and the G-tube placed to gravity drainage.  The patient was then awakened, extubated and taken to PACU in stable condition.   All counts were correct at the completion of the case.

## 2020-07-04 NOTE — H&P (Signed)
Surgical Evaluation Requesting provider: Norman Clay MD  Chief Complaint: abdominal pain  HPI: This is a very pleasant and relatively healthy 70 year old woman who presents with acute onset abdominal pain.  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  Despite removing this from her diet she continued to have symptoms.  In previous episodes, she never had any abdominal pain however this current episode, which has lasted for several days, was accompanied by a diffuse crampy uncomfortable sensation throughout her abdomen which increased in severity as the day has gone on today.  She reports that her bowel movements have been normal.  She denies any alcohol, tobacco, or NSAID use.  No previous known history of peptic ulcer disease.  Her only previous abdominal surgery was a left femoral hernia repair by Dr. Corliss Skains in 2018.  Allergies  Allergen Reactions  . Latex Itching    hands  . Penicillins Hives and Rash    Past Medical History:  Diagnosis Date  . Anemia   . GERD (gastroesophageal reflux disease)   . Inguinal hernia    left    Past Surgical History:  Procedure Laterality Date  . CYST REMOVAL NECK    . DENTAL SURGERY    . FEMORAL HERNIA REPAIR Left 07/17/2016   Procedure: LEFT FEMORAL  HERNIA REPAIR WITH MESH;  Surgeon: Manus Rudd, MD;  Location: [facility];  Service: General;  Laterality: Left;    History reviewed. No pertinent family history.  Social History   Socioeconomic History  . Marital status: Divorced    Spouse name: Not on file  . Number of children: Not on file  . Years of education: Not on file  . Highest education level: Not on file  Occupational History  . Not on file  Tobacco Use  . Smoking status: Never Smoker  . Smokeless tobacco: Never Used  Substance and Sexual Activity  . Alcohol use: No     Alcohol/week: 0.0 standard drinks  . Drug use: No  . Sexual activity: Not on file  Other Topics Concern  . Not on file  Social History Narrative  . Not on file   Social Determinants of Health   Financial Resource Strain: Not on file  Food Insecurity: Not on file  Transportation Needs: Not on file  Physical Activity: Not on file  Stress: Not on file  Social Connections: Not on file    No current facility-administered medications on file prior to encounter.   Current Outpatient Medications on File Prior to Encounter  Medication Sig Dispense Refill  . calcium carbonate (TUMS - DOSED IN MG ELEMENTAL CALCIUM) 500 MG chewable tablet Chew 1 tablet by mouth daily.    . cetirizine (ZYRTEC ALLERGY) 10 MG tablet Take 1 tablet (10 mg total) by mouth daily. 30 tablet 0  . clotrimazole (LOTRIMIN) 1 % cream Apply 1 application topically 2 (two) times daily. 30 g 0  . doxycycline (VIBRAMYCIN) 100 MG capsule Take 1 capsule (100 mg total) by mouth 2 (two) times daily. 20 capsule 0  . famotidine (PEPCID) 20 MG tablet Take 1 tablet (20 mg total) by mouth 2 (two) times daily. 30 tablet 0  . loratadine (CLARITIN) 10 MG tablet Take 10 mg by mouth daily.      Review of Systems: a complete, 10pt review of systems was completed with pertinent positives and negatives as  documented in the HPI  Physical Exam: Vitals:   07/04/20 1515 07/04/20 1650  BP: (!) 154/105 (!) 165/105  Pulse: 87 78  Resp: (!) 26 (!) 35  Temp:    SpO2: 98% 98%   Gen: A&Ox3, clearly uncomfortable, moaning in pain and motionless on the stretcher Eyes: lids and conjunctivae normal, no icterus. Pupils equally round and reactive to light.  Neck: supple without mass or thyromegaly Chest: respiratory effort is normal. No crepitus or tenderness on palpation of the chest. Breath sounds equal.  Cardiovascular: RRR with palpable distal pulses, no pedal edema Gastrointestinal: Diffusely exquisitely tender with peritonitis.  No palpable  mass. Lymphatic: no lymphadenopathy in the neck or groin Muscoloskeletal: no clubbing or cyanosis of the fingers.  Strength is symmetrical throughout.  Range of motion of bilateral upper and lower extremities normal without pain, crepitation or contracture. Neuro: cranial nerves grossly intact.  Sensation intact to light touch diffusely. Psych: appropriate mood and affect, normal insight/judgment intact  Skin: warm and dry   CBC Latest Ref Rng & Units 07/04/2020 04/01/2018 07/17/2016  WBC 4.0 - 10.5 K/uL 8.2 5.5 -  Hemoglobin 12.0 - 15.0 g/dL 15.3(H) 12.6 10.5(L)  Hematocrit 36.0 - 46.0 % 47.7(H) 39.7 -  Platelets 150 - 400 K/uL 265 225 -    CMP Latest Ref Rng & Units 07/04/2020 04/01/2018 04/16/2016  Glucose 70 - 99 mg/dL 416(L) 87 88  BUN 8 - 23 mg/dL 17 10 12   Creatinine 0.44 - 1.00 mg/dL ) 8.45(X 6.46  Sodium 135 - 145 mmol/L 143 141 142  Potassium 3.5 - 5.1 mmol/L 3.4(L) 3.9 4.0  Chloride 98 - 111 mmol/L 105 107 104  CO2 22 - 32 mmol/L 26 26 26   Calcium 8.9 - 10.3 mg/dL 8.03 9.4 9.5  Total Protein 6.5 - 8.1 g/dL 7.7 6.7 -  Total Bilirubin 0.3 - 1.2 mg/dL 0.9 0.6 -  Alkaline Phos 38 - 126 U/L 76 63 -  AST 15 - 41 U/L 24 18 -  ALT 0 - 44 U/L 17 14 -    No results found for: INR, PROTIME  Imaging: CT ABDOMEN PELVIS W CONTRAST  Result Date: 07/04/2020 CLINICAL DATA:  Abdominal pain EXAM: CT ABDOMEN AND PELVIS WITH CONTRAST TECHNIQUE: Multidetector CT imaging of the abdomen and pelvis was performed using the standard protocol following bolus administration of intravenous contrast. CONTRAST:  21.2 OMNIPAQUE IOHEXOL 300 MG/ML  SOLN COMPARISON:  None. FINDINGS: Lower chest: No acute abnormality. Hepatobiliary: Gallbladder is within normal limits. Liver appears within normal limits with the exception of a small cyst in the lateral right lobe near the dome of the diaphragm measuring 13 mm in greatest dimension. Pancreas: Unremarkable. No pancreatic ductal dilatation or surrounding  inflammatory changes. Spleen: Normal in size without focal abnormality. Adrenals/Urinary Tract: Adrenal glands are within normal limits. Kidneys demonstrate a normal enhancement pattern bilaterally. No renal calculi or obstructive changes are seen. Normal excretion of contrast is noted bilaterally. The bladder is partially distended. Stomach/Bowel: Diverticular change of the colon is noted. No definitive diverticulitis is seen. The more proximal colon appears within normal limits. The appendix is not discretely visualized. Small bowel is unremarkable. The stomach is significantly distended with fluid and shows approximately 50% of the stomach within the right chest cavity secondary to a hiatal hernia. There is abrupt caliber change at the distal aspect of the stomach which along with the distension of the stomach suggests a degree of gastric outlet obstruction. Some mild surrounding inflammatory change and free  air in the abdomen is identified suspicious for a gastric perforation. A defect in the gastric wall is appreciated on coronal image number 56 of series 6 and sagittal image number 108 of series 7. Vascular/Lymphatic: Left retroaortic renal vein is noted no significant vascular calcifications are noted. No aneurysmal dilatation is seen. No lymphadenopathy is noted. Reproductive: Calcified uterine fibroids are noted. Additionally non-calcified fibroids are noted measuring at least 2.8 cm in greatest dimension. No adnexal abnormality is seen. Other: Mild free fluid is noted within the pelvis. Free air is noted within the upper abdomen again suspicious for gastric perforation given the degree of gastric outlet dilatation. Small air containing umbilical hernia is noted related to the perforation. Musculoskeletal: No acute or significant osseous findings. IMPRESSION: Significant gastric distension with abrupt caliber change at the level of the proximal duodenum with surrounding inflammatory change and free air  suspicious for gastric perforation. Area of perforation is suggested on the coronal images number 56 of series 6. Additionally at least 50% of the stomach lies within the right hemithorax secondary to a large hiatal hernia. This likely contributes to the patient's gastric distension. Diverticulosis without diverticulitis. Uterine fibroid change. Critical Value/emergent results were called by telephone at the time of interpretation on 07/04/2020 at 4:18 pm to [facility] , who verbally acknowledged these results. Electronically Signed   By: Alcide Clever M.D.   On: 07/04/2020 16:25     A/P: Perforated intra-abdominal viscus in setting of large paraesophageal hernia, massively dilated stomach.  I recommend exploratory laparotomy, abdominal washout, likely Graham patch repair versus partial gastrectomy, likely gastrostomy tube placement.  We discussed the surgery and the risks of bleeding, infection, pain, scarring, repair failure, need for additional drains or additional surgeries, aspiration, cardiovascular/pulmonary/thromboembolic complications, prolonged hospitalization and recovery.  Questions were welcomed and answered.  She is agreeable to proceed.  There is no suitable alternative to surgery.    Patient Active Problem List   Diagnosis Date Noted  . Family history of colonic polyps 04/04/2016       Phylliss Blakes, MD [facility]  See AMION to contact appropriate on-call provider

## 2020-07-04 NOTE — ED Triage Notes (Addendum)
Pt arrived via EMS w/ c/o abdominal pain in LLQ x 1 week. Pt reports vomiting x 2 days and inability to keep anything down. VSS BP 108/82, HR 91, SpO2 99%, CBG 208.

## 2020-07-04 NOTE — Transfer of Care (Signed)
Immediate Anesthesia Transfer of Care Note  Patient: Alyssa Peterson  Procedure(s) Performed: EXPLORATORY LAPAROTOMY (N/A Abdomen) GASTROSTOMY TUBE (N/A Abdomen)  Patient Location: PACU  Anesthesia Type:General  Level of Consciousness: drowsy  Airway & Oxygen Therapy: Patient Spontanous Breathing and Patient connected to face mask oxygen  Post-op Assessment: Report given to RN and Post -op Vital signs reviewed and stable  Post vital signs: Reviewed and stable  Last Vitals:  Vitals Value Taken Time  BP 166/137 07/04/20 1942  Temp    Pulse 88 07/04/20 1945  Resp 25 07/04/20 1945  SpO2 99 % 07/04/20 1945  Vitals shown include unvalidated device data.  Last Pain:  Vitals:   07/04/20 1718  TempSrc:   PainSc: 5          Complications: No complications documented.

## 2020-07-04 NOTE — ED Provider Notes (Signed)
Physical Exam  BP (!) 149/107   Pulse 84   Temp 98.4 F (36.9 C) (Oral)   Resp 18   Ht 5\' 3"  (1.6 m)   Wt 59 kg   SpO2 99%   BMI 23.04 kg/m   Physical Exam Vitals and nursing note reviewed.  Constitutional:      General: She is not in acute distress.    Appearance: She is well-developed.     Comments: Appears uncomfortable due to pain.  HENT:     Head: Normocephalic and atraumatic.  Cardiovascular:     Rate and Rhythm: Normal rate and regular rhythm.     Pulses: Normal pulses.  Pulmonary:     Effort: Pulmonary effort is normal. Tachypnea present.  Abdominal:     Tenderness: There is abdominal tenderness. There is guarding.  Musculoskeletal:        General: Normal range of motion.     Cervical back: Normal range of motion.  Skin:    General: Skin is warm.     Findings: No rash.  Neurological:     Mental Status: She is alert and oriented to person, place, and time.     ED Course/Procedures   Clinical Course as of 07/04/20 1714  Wed Jul 04, 2020  1640 I was directly involved in this patients medical care.  [JH]    Clinical Course User Index [JH] Jul 06, 2020 Audley Hose, MD    .Critical Care Performed by: Eustace Moore, PA-C Authorized by: Alveria Apley, PA-C   Critical care provider statement:    Critical care time (minutes):  35   Critical care time was exclusive of:  Separately billable procedures and treating other patients and teaching time   Critical care was necessary to treat or prevent imminent or life-threatening deterioration of the following conditions:  Sepsis   Critical care was time spent personally by me on the following activities:  Blood draw for specimens, development of treatment plan with patient or surrogate, discussions with consultants, evaluation of patient's response to treatment, examination of patient, obtaining history from patient or surrogate, ordering and performing treatments and interventions, ordering and review of laboratory  studies, ordering and review of radiographic studies, re-evaluation of patient's condition, pulse oximetry and review of old charts   I assumed direction of critical care for this patient from another provider in my specialty: yes     Care discussed with: admitting provider   Comments:     Pt with gastric perf requiring emergent surgery consulation, IV abx, and admission.     MDM  Pt signed out to me by B Alveria Apley, PA-C. Please see previous notes for further history.   In brief, pt presenting for evaluation of n/v and abd pain. Present x1 month, worsening in the past few days. No fevers, +chills. Normal BMs. Labs overall reassuring, mild elevation in SCr from baseline, likely 2/2 dehydration in the setting of vomiting and poor PO intake. Will tx with fluids. CT pending.   CT concerning for gastric perforation with free air.  Patient also with very large hiatal hernia, greater than 50%, in the hemithorax.  Will start antibiotics and consult with general surgery.  On my reevaluation, patient remains very uncomfortable with diffuse tenderness palpation of the abdomen.  Voluntary guarding, no rebound.  She is mildly tachypneic, however she is not tachycardic and blood pressure is stable.  Discussed with Dr. Laveda Norman from general surgery, pt to be admitted.   Results for orders placed or performed during the hospital  encounter of 07/04/20  CBC with Differential  Result Value Ref Range   WBC 8.2 4.0 - 10.5 K/uL   RBC 5.22 (H) 3.87 - 5.11 MIL/uL   Hemoglobin 15.3 (H) 12.0 - 15.0 g/dL   HCT 96.2 (H) 95.2 - 84.1 %   MCV 91.4 80.0 - 100.0 fL   MCH 29.3 26.0 - 34.0 pg   MCHC 32.1 30.0 - 36.0 g/dL   RDW 32.4 40.1 - 02.7 %   Platelets 265 150 - 400 K/uL   nRBC 0.0 0.0 - 0.2 %   Neutrophils Relative % 89 %   Neutro Abs 7.2 1.7 - 7.7 K/uL   Lymphocytes Relative 7 %   Lymphs Abs 0.6 (L) 0.7 - 4.0 K/uL   Monocytes Relative 3 %   Monocytes Absolute 0.3 0.1 - 1.0 K/uL   Eosinophils Relative 0 %    Eosinophils Absolute 0.0 0.0 - 0.5 K/uL   Basophils Relative 0 %   Basophils Absolute 0.0 0.0 - 0.1 K/uL   Immature Granulocytes 1 %   Abs Immature Granulocytes 0.05 0.00 - 0.07 K/uL  Comprehensive metabolic panel  Result Value Ref Range   Sodium 143 135 - 145 mmol/L   Potassium 3.4 (L) 3.5 - 5.1 mmol/L   Chloride 105 98 - 111 mmol/L   CO2 26 22 - 32 mmol/L   Glucose, Bld 220 (H) 70 - 99 mg/dL   BUN 17 8 - 23 mg/dL   Creatinine, Ser 2.53 (H) 0.44 - 1.00 mg/dL   Calcium 66.4 8.9 - 40.3 mg/dL   Total Protein 7.7 6.5 - 8.1 g/dL   Albumin 4.1 3.5 - 5.0 g/dL   AST 24 15 - 41 U/L   ALT 17 0 - 44 U/L   Alkaline Phosphatase 76 38 - 126 U/L   Total Bilirubin 0.9 0.3 - 1.2 mg/dL   GFR, Estimated 56 (L) >60 mL/min   Anion gap 12 5 - 15  Lipase, blood  Result Value Ref Range   Lipase 23 11 - 51 U/L   CT ABDOMEN PELVIS W CONTRAST  Result Date: 07/04/2020 CLINICAL DATA:  Abdominal pain EXAM: CT ABDOMEN AND PELVIS WITH CONTRAST TECHNIQUE: Multidetector CT imaging of the abdomen and pelvis was performed using the standard protocol following bolus administration of intravenous contrast. CONTRAST:  OMNIPAQUE IOHEXOL 300 MG/ML  SOLN COMPARISON:  None. FINDINGS: Lower chest: No acute abnormality. Hepatobiliary: Gallbladder is within normal limits. Liver appears within normal limits with the exception of a small cyst in the lateral right lobe near the dome of the diaphragm measuring 13 mm in greatest dimension. Pancreas: Unremarkable. No pancreatic ductal dilatation or surrounding inflammatory changes. Spleen: Normal in size without focal abnormality. Adrenals/Urinary Tract: Adrenal glands are within normal limits. Kidneys demonstrate a normal enhancement pattern bilaterally. No renal calculi or obstructive changes are seen. Normal excretion of contrast is noted bilaterally. The bladder is partially distended. Stomach/Bowel: Diverticular change of the colon is noted. No definitive diverticulitis is  seen. The more proximal colon appears within normal limits. The appendix is not discretely visualized. Small bowel is unremarkable. The stomach is significantly distended with fluid and shows approximately 50% of the stomach within the right chest cavity secondary to a hiatal hernia. There is abrupt caliber change at the distal aspect of the stomach which along with the distension of the stomach suggests a degree of gastric outlet obstruction. Some mild surrounding inflammatory change and free air in the abdomen is identified suspicious for a gastric perforation.  A defect in the gastric wall is appreciated on coronal image number 56 of series 6 and sagittal image number 108 of series 7. Vascular/Lymphatic: Left retroaortic renal vein is noted no significant vascular calcifications are noted. No aneurysmal dilatation is seen. No lymphadenopathy is noted. Reproductive: Calcified uterine fibroids are noted. Additionally non-calcified fibroids are noted measuring at least 2.8 cm in greatest dimension. No adnexal abnormality is seen. Other: Mild free fluid is noted within the pelvis. Free air is noted within the upper abdomen again suspicious for gastric perforation given the degree of gastric outlet dilatation. Small air containing umbilical hernia is noted related to the perforation. Musculoskeletal: No acute or significant osseous findings. IMPRESSION: Significant gastric distension with abrupt caliber change at the level of the proximal duodenum with surrounding inflammatory change and free air suspicious for gastric perforation. Area of perforation is suggested on the coronal images number 56 of series 6. Additionally at least 50% of the stomach lies within the right hemithorax secondary to a large hiatal hernia. This likely contributes to the patient's gastric distension. Diverticulosis without diverticulitis. Uterine fibroid change. Critical Value/emergent results were called by telephone at the time of  interpretation on 07/04/2020 at 4:18 pm to [facility] , who verbally acknowledged these results. Electronically Signed   By: Alcide Clever M.D.   On: 07/04/2020 16:25           Alveria Apley, PA-C 07/04/20 1717    Cheryll Cockayne, MD 07/04/20 [phone]

## 2020-07-05 ENCOUNTER — Encounter (HOSPITAL_COMMUNITY): Payer: Self-pay | Admitting: Surgery

## 2020-07-05 LAB — BASIC METABOLIC PANEL
Anion gap: 5 (ref 5–15)
BUN: 12 mg/dL (ref 8–23)
CO2: 23 mmol/L (ref 22–32)
Calcium: 8 mg/dL — ABNORMAL LOW (ref 8.9–10.3)
Chloride: 109 mmol/L (ref 98–111)
Creatinine, Ser: 0.78 mg/dL (ref 0.44–1.00)
GFR, Estimated: >60 mL/min (ref >60)
Glucose, Bld: 154 mg/dL — ABNORMAL HIGH (ref 70–99)
Potassium: 3.7 mmol/L (ref 3.5–5.1)
Sodium: 137 mmol/L (ref 135–145)

## 2020-07-05 LAB — CBC
HCT: 44.6 % (ref 36.0–46.0)
Hemoglobin: 14.6 g/dL (ref 12.0–15.0)
MCH: 29.6 pg (ref 26.0–34.0)
MCHC: 32.7 g/dL (ref 30.0–36.0)
MCV: 90.5 fL (ref 80.0–100.0)
Platelets: 219 10*3/uL (ref 150–400)
RBC: 4.93 MIL/uL (ref 3.87–5.11)
RDW: 13.7 % (ref 11.5–15.5)
WBC: 6.3 10*3/uL (ref 4.0–10.5)
nRBC: 0 % (ref 0.0–0.2)

## 2020-07-05 LAB — MAGNESIUM: Magnesium: 1.4 mg/dL — ABNORMAL LOW (ref 1.7–2.4)

## 2020-07-05 MED ORDER — CHLORHEXIDINE GLUCONATE CLOTH 2 % EX PADS: 6.0000 | MEDICATED_PAD | Freq: Every day | CUTANEOUS | Status: DC

## 2020-07-05 NOTE — Progress Notes (Signed)
Mobility Specialist - Progress Note   07/05/20 1457  Mobility  Activity Ambulated in hall  Level of Assistance Minimal assist, patient does 75% or more  Assistive Device Front wheel walker  Distance Ambulated (ft) 470 ft  Mobility Response Tolerated well  Mobility performed by Mobility specialist  $Mobility charge 1 Mobility   Pre-mobility: 79 HR, 131/86 BP Post-mobility: 82 HR, 138/89 BP  Pt seen at request of RN, she was min assist to sit up on edge of bed due to abdominal soreness. She endorsed minor dizziness that improved w/ time when standing. Pt lost balance once while walking, min assist to help her correct herself. She was min assist to elevate her legs when getting back into bed.  Mamie Levers Mobility Specialist Mobility Specialist Phone: [phone]

## 2020-07-05 NOTE — Progress Notes (Signed)
1 Day Post-Op  Subjective: Looks great this am considering she is only 12 hours post op.  Pain is well controlled.  No nausea.  No g-tube output.  Foley in place.  Discussed surgical findings and surgical procedure.  ROS: See above, otherwise other systems negative  Objective: Vital signs in last 24 hours: Temp:  [97 F (36.1 C)-98.7 F (37.1 C)] 98.5 F (36.9 C) (04/14 0700) Pulse Rate:  [78-95] 93 (04/14 0700) Resp:  [17-35] 20 (04/14 0700) BP: (124-166)/(86-137) 127/86 (04/14 0700) SpO2:  [95 %-100 %] 98 % (04/14 0700) Weight:  [59 kg] 59 kg (04/13 1323)    Intake/Output from previous day: 04/13 0701 - 04/14 0700 In: 5618.1 [I.V.:4252.8; IV Piggyback:1365.3] Out: 900 [Urine:700; Drains:150; Blood:50] Intake/Output this shift: No intake/output data recorded.  PE: Heart: regular Lungs: CTAB Abd: soft, appropriately tender, midline wound is clean and packed, JP drain with serous output, g-tube in place and to gravity with almost essentially no output GU: foley in place with straw colored urine, 700cc  Lab Results:  Recent Labs    07/04/20 1427 07/05/20 0213  WBC 8.2 6.3  HGB 15.3* 14.6  HCT 47.7* 44.6  PLT 265 219   BMET Recent Labs    07/04/20 1427 07/05/20 0213  NA 143 137  K 3.4* 3.7  CL 105 109  CO2 26 23  GLUCOSE 220* 154*  BUN 17 12  CREATININE 1.08* 0.78  CALCIUM 10.0 8.0*   PT/INR No results for input(s): LABPROT, INR in the last 72 hours. CMP     Component Value Date/Time   NA 137 07/05/2020 0213   NA 142 04/16/2016 1732   K 3.7 07/05/2020 0213   CL 109 07/05/2020 0213   CO2 23 07/05/2020 0213   GLUCOSE 154 (H) 07/05/2020 0213   BUN 12 07/05/2020 0213   BUN 12 04/16/2016 1732   CREATININE 0.78 07/05/2020 0213   CALCIUM 8.0 (L) 07/05/2020 0213   PROT 7.7 07/04/2020 1427   ALBUMIN 4.1 07/04/2020 1427   AST 24 07/04/2020 1427   ALT 17 07/04/2020 1427   ALKPHOS 76 07/04/2020 1427   BILITOT 0.9 07/04/2020 1427   GFRNONAA >60  07/05/2020 0213   GFRAA >60 04/01/2018 2122   Lipase     Component Value Date/Time   LIPASE 23 07/04/2020 1427       Studies/Results: CT ABDOMEN PELVIS W CONTRAST  Result Date: 07/04/2020 CLINICAL DATA:  Abdominal pain EXAM: CT ABDOMEN AND PELVIS WITH CONTRAST TECHNIQUE: Multidetector CT imaging of the abdomen and pelvis was performed using the standard protocol following bolus administration of intravenous contrast. CONTRAST:  OMNIPAQUE IOHEXOL 300 MG/ML  SOLN COMPARISON:  None. FINDINGS: Lower chest: No acute abnormality. Hepatobiliary: Gallbladder is within normal limits. Liver appears within normal limits with the exception of a small cyst in the lateral right lobe near the dome of the diaphragm measuring 13 mm in greatest dimension. Pancreas: Unremarkable. No pancreatic ductal dilatation or surrounding inflammatory changes. Spleen: Normal in size without focal abnormality. Adrenals/Urinary Tract: Adrenal glands are within normal limits. Kidneys demonstrate a normal enhancement pattern bilaterally. No renal calculi or obstructive changes are seen. Normal excretion of contrast is noted bilaterally. The bladder is partially distended. Stomach/Bowel: Diverticular change of the colon is noted. No definitive diverticulitis is seen. The more proximal colon appears within normal limits. The appendix is not discretely visualized. Small bowel is unremarkable. The stomach is significantly distended with fluid and shows approximately 50% of the stomach within  the right chest cavity secondary to a hiatal hernia. There is abrupt caliber change at the distal aspect of the stomach which along with the distension of the stomach suggests a degree of gastric outlet obstruction. Some mild surrounding inflammatory change and free air in the abdomen is identified suspicious for a gastric perforation. A defect in the gastric wall is appreciated on coronal image number 56 of series 6 and sagittal image number 108  of series 7. Vascular/Lymphatic: Left retroaortic renal vein is noted no significant vascular calcifications are noted. No aneurysmal dilatation is seen. No lymphadenopathy is noted. Reproductive: Calcified uterine fibroids are noted. Additionally non-calcified fibroids are noted measuring at least 2.8 cm in greatest dimension. No adnexal abnormality is seen. Other: Mild free fluid is noted within the pelvis. Free air is noted within the upper abdomen again suspicious for gastric perforation given the degree of gastric outlet dilatation. Small air containing umbilical hernia is noted related to the perforation. Musculoskeletal: No acute or significant osseous findings. IMPRESSION: Significant gastric distension with abrupt caliber change at the level of the proximal duodenum with surrounding inflammatory change and free air suspicious for gastric perforation. Area of perforation is suggested on the coronal images number 56 of series 6. Additionally at least 50% of the stomach lies within the right hemithorax secondary to a large hiatal hernia. This likely contributes to the patient's gastric distension. Diverticulosis without diverticulitis. Uterine fibroid change. Critical Value/emergent results were called by telephone at the time of interpretation on 07/04/2020 at 4:18 pm to [facility], [facility] , who verbally acknowledged these results. Electronically Signed   By: Alcide Clever M.D.   On: 07/04/2020 16:25    Anti-infectives: Anti-infectives (From admission, onward)   Start     Dose/Rate Route Frequency Ordered Stop   07/04/20 1715  cefTRIAXone (ROCEPHIN) 2 g in sodium chloride 0.9 % 100 mL IVPB        2 g 200 mL/hr over 30 Minutes Intravenous Every 24 hours 07/04/20 1703     07/04/20 1715  metroNIDAZOLE (FLAGYL) IVPB 500 mg        500 mg 100 mL/hr over 60 Minutes Intravenous Every 8 hours 07/04/20 1703         Assessment/Plan GERD - protonix  POD 1, s/p ex lap with reduction of incarcerated  paraesophageal hernia, repair of gastric perforation x2, placement of g-tube and JP drain, Dr. Fredricka Bonine 4/13 -protonix -leave g-tube to gravity for a couple of days then plan UGI prior to clamping and giving CLD -continue JP drain -BID dressing changes to midline wound -DC foley, cont IVFs for now -mobilize -pulm toilet/IS  FEN - NPO, g-tube to gravity, IVFs VTE - Lovenox ID - Rocephin/Flagyl   LOS: 1 day    Letha Cape , [facility] Surgery 07/05/2020, 8:07 AM Please see Amion for pager number during day hours 7:00am-4:30pm or 7:00am -11:30am on weekends

## 2020-07-05 NOTE — Anesthesia Postprocedure Evaluation (Signed)
Anesthesia Post Note  Patient: Roshan Salamon Bathe  Procedure(s) Performed: EXPLORATORY LAPAROTOMY (N/A Abdomen) GASTROSTOMY TUBE (N/A Abdomen)     Patient location during evaluation: PACU Anesthesia Type: General Level of consciousness: sedated and patient cooperative Pain management: pain level controlled Vital Signs Assessment: post-procedure vital signs reviewed and stable Respiratory status: spontaneous breathing Cardiovascular status: stable Anesthetic complications: no   No complications documented.  Last Vitals:  Vitals:   07/05/20 1833 07/05/20 2046  BP: 133/83 (!) 127/58  Pulse: 84 83  Resp: (!) 22 20  Temp: 37.1 C 37.3 C  SpO2: 98% 98%    Last Pain:  Vitals:   07/05/20 2046  TempSrc: Oral  PainSc:                  Lewie Loron

## 2020-07-06 ENCOUNTER — Inpatient Hospital Stay (HOSPITAL_COMMUNITY): Payer: Medicare Other

## 2020-07-06 LAB — BASIC METABOLIC PANEL
Anion gap: 7 (ref 5–15)
BUN: 11 mg/dL (ref 8–23)
CO2: 21 mmol/L — ABNORMAL LOW (ref 22–32)
Calcium: 8.1 mg/dL — ABNORMAL LOW (ref 8.9–10.3)
Chloride: 114 mmol/L — ABNORMAL HIGH (ref 98–111)
Creatinine, Ser: 0.78 mg/dL (ref 0.44–1.00)
GFR, Estimated: >60 mL/min (ref >60)
Glucose, Bld: 80 mg/dL (ref 70–99)
Potassium: 3.5 mmol/L (ref 3.5–5.1)
Sodium: 142 mmol/L (ref 135–145)

## 2020-07-06 LAB — CBC
HCT: 36.1 % (ref 36.0–46.0)
Hemoglobin: 11.4 g/dL — ABNORMAL LOW (ref 12.0–15.0)
MCH: 29.4 pg (ref 26.0–34.0)
MCHC: 31.6 g/dL (ref 30.0–36.0)
MCV: 93 fL (ref 80.0–100.0)
Platelets: 158 10*3/uL (ref 150–400)
RBC: 3.88 MIL/uL (ref 3.87–5.11)
RDW: 14.1 % (ref 11.5–15.5)
WBC: 6 10*3/uL (ref 4.0–10.5)
nRBC: 0 % (ref 0.0–0.2)

## 2020-07-06 MED ORDER — IOHEXOL 300 MG/ML  SOLN
150.0000 mL | Freq: Once | INTRAMUSCULAR | Status: AC | PRN
  Administered 2020-07-06: 150 mL via ORAL

## 2020-07-06 NOTE — Progress Notes (Signed)
Mobility Specialist - Progress Note   07/06/20 1149  Mobility  Activity Ambulated in hall  Level of Assistance Minimal assist, patient does 75% or more  Assistive Device Front wheel walker  Distance Ambulated (ft) 470 ft  Mobility Response Tolerated well  Mobility performed by Mobility specialist  $Mobility charge 1 Mobility   Pt min assist to sit up on edge bed. She was asx throughout ambulation. Pt back in bed after walk. VSS.   Mamie Levers Mobility Specialist Mobility Specialist Phone: [phone]

## 2020-07-06 NOTE — Progress Notes (Signed)
2 Days Post-Op  Subjective: Feeling well this morning, just sore around incisions.  G tube and JP minimal drainage.  Thirsty  ROS: See above, otherwise other systems negative  Objective: Vital signs in last 24 hours: Temp:  [98.3 F (36.8 C)-99.6 F (37.6 C)] 98.8 F (37.1 C) (04/15 0920) Pulse Rate:  [76-87] 85 (04/15 0920) Resp:  [17-22] 18 (04/15 0920) BP: (118-136)/(58-92) 136/91 (04/15 0920) SpO2:  [97 %-99 %] 97 % (04/15 0920) Last BM Date: 07/04/20  Intake/Output from previous day: 04/14 0701 - 04/15 0700 In: 1770 [I.V.:1570; IV Piggyback:200] Out: 660 [Urine:450; Drains:210] Intake/Output this shift: No intake/output data recorded.  PE: Heart: regular Lungs: CTAB Abd: soft, appropriately tender, midline wound is clean and packed, JP drain with serous output, g-tube in place and to gravity with almost essentially no output GU: foley in place with straw colored urine, 700cc  Lab Results:  Recent Labs    07/05/20 0213 07/06/20 0239  WBC 6.3 6.0  HGB 14.6 11.4*  HCT 44.6 36.1  PLT 219 158   BMET Recent Labs    07/05/20 0213 07/06/20 0239  NA 137 142  K 3.7 3.5  CL 109 114*  CO2 23 21*  GLUCOSE 154* 80  BUN 12 11  CREATININE 0.78 0.78  CALCIUM 8.0* 8.1*   PT/INR No results for input(s): LABPROT, INR in the last 72 hours. CMP     Component Value Date/Time   NA 142 07/06/2020 0239   NA 142 04/16/2016 1732   K 3.5 07/06/2020 0239   CL 114 (H) 07/06/2020 0239   CO2 21 (L) 07/06/2020 0239   GLUCOSE 80 07/06/2020 0239   BUN 11 07/06/2020 0239   BUN 12 04/16/2016 1732   CREATININE 0.78 07/06/2020 0239   CALCIUM 8.1 (L) 07/06/2020 0239   PROT 7.7 07/04/2020 1427   ALBUMIN 4.1 07/04/2020 1427   AST 24 07/04/2020 1427   ALT 17 07/04/2020 1427   ALKPHOS 76 07/04/2020 1427   BILITOT 0.9 07/04/2020 1427   GFRNONAA >60 07/06/2020 0239   GFRAA >60 04/01/2018 2122   Lipase     Component Value Date/Time   LIPASE 23 07/04/2020 1427        Studies/Results: CT ABDOMEN PELVIS W CONTRAST  Result Date: 07/04/2020 CLINICAL DATA:  Abdominal pain EXAM: CT ABDOMEN AND PELVIS WITH CONTRAST TECHNIQUE: Multidetector CT imaging of the abdomen and pelvis was performed using the standard protocol following bolus administration of intravenous contrast. CONTRAST:  OMNIPAQUE IOHEXOL 300 MG/ML  SOLN COMPARISON:  None. FINDINGS: Lower chest: No acute abnormality. Hepatobiliary: Gallbladder is within normal limits. Liver appears within normal limits with the exception of a small cyst in the lateral right lobe near the dome of the diaphragm measuring 13 mm in greatest dimension. Pancreas: Unremarkable. No pancreatic ductal dilatation or surrounding inflammatory changes. Spleen: Normal in size without focal abnormality. Adrenals/Urinary Tract: Adrenal glands are within normal limits. Kidneys demonstrate a normal enhancement pattern bilaterally. No renal calculi or obstructive changes are seen. Normal excretion of contrast is noted bilaterally. The bladder is partially distended. Stomach/Bowel: Diverticular change of the colon is noted. No definitive diverticulitis is seen. The more proximal colon appears within normal limits. The appendix is not discretely visualized. Small bowel is unremarkable. The stomach is significantly distended with fluid and shows approximately 50% of the stomach within the right chest cavity secondary to a hiatal hernia. There is abrupt caliber change at the distal aspect of the stomach which along with  the distension of the stomach suggests a degree of gastric outlet obstruction. Some mild surrounding inflammatory change and free air in the abdomen is identified suspicious for a gastric perforation. A defect in the gastric wall is appreciated on coronal image number 56 of series 6 and sagittal image number 108 of series 7. Vascular/Lymphatic: Left retroaortic renal vein is noted no significant vascular calcifications are  noted. No aneurysmal dilatation is seen. No lymphadenopathy is noted. Reproductive: Calcified uterine fibroids are noted. Additionally non-calcified fibroids are noted measuring at least 2.8 cm in greatest dimension. No adnexal abnormality is seen. Other: Mild free fluid is noted within the pelvis. Free air is noted within the upper abdomen again suspicious for gastric perforation given the degree of gastric outlet dilatation. Small air containing umbilical hernia is noted related to the perforation. Musculoskeletal: No acute or significant osseous findings. IMPRESSION: Significant gastric distension with abrupt caliber change at the level of the proximal duodenum with surrounding inflammatory change and free air suspicious for gastric perforation. Area of perforation is suggested on the coronal images number 56 of series 6. Additionally at least 50% of the stomach lies within the right hemithorax secondary to a large hiatal hernia. This likely contributes to the patient's gastric distension. Diverticulosis without diverticulitis. Uterine fibroid change. Critical Value/emergent results were called by telephone at the time of interpretation on 07/04/2020 at 4:18 pm to [facility], [facility] , who verbally acknowledged these results. Electronically Signed   By: Alcide Clever M.D.   On: 07/04/2020 16:25    Anti-infectives: Anti-infectives (From admission, onward)   Start     Dose/Rate Route Frequency Ordered Stop   07/04/20 1715  cefTRIAXone (ROCEPHIN) 2 g in sodium chloride 0.9 % 100 mL IVPB        2 g 200 mL/hr over 30 Minutes Intravenous Every 24 hours 07/04/20 1703     07/04/20 1715  metroNIDAZOLE (FLAGYL) IVPB 500 mg        500 mg 100 mL/hr over 60 Minutes Intravenous Every 8 hours 07/04/20 1703         Assessment/Plan GERD - protonix  POD 1, s/p ex lap with reduction of incarcerated paraesophageal hernia, repair of gastric perforation x2, placement of g-tube and JP drain, Dr. Fredricka Bonine  4/13 -protonix -UGI today to evaluate for gastric leak, then proceed with CLD if UGI negative -continue JP drain -BID dressing changes to midline wound -IVF -mobilize -pulm toilet/IS  FEN - NPO, g-tube to gravity, IVFs - diet pending UGI VTE - Lovenox ID - Rocephin/Flagyl   LOS: 2 days    Quentin Ore, MD [facility] Surgery 07/06/2020, 9:48 AM Please see Amion for pager number during day hours 7:00am-4:30pm or 7:00am -11:30am on weekends

## 2020-07-07 NOTE — Progress Notes (Signed)
Mobility Specialist: Progress Note   07/07/20 1626  Mobility  Activity Ambulated in hall  Level of Assistance Contact guard assist, steadying assist  Assistive Device Front wheel walker  Distance Ambulated (ft) 470 ft  Mobility Response Tolerated well  Mobility performed by Mobility specialist  $Mobility charge 1 Mobility   Pre-Mobility: 76 HR Post-Mobility: 66 HR, 138/83 BP, 98% SpO2  Pt stopped for a brief standing break due to feeling SOB, otherwise asx. Pt back to bed per request.   Cristal Deer Isobel Eisenhuth Mobility Specialist Mobility Specialist Phone: [phone]

## 2020-07-08 MED ORDER — BOOST / RESOURCE BREEZE PO LIQD CUSTOM
1.0000 | Freq: Three times a day (TID) | ORAL | Status: DC
  Administered 2020-07-08 (×2): 1 via ORAL

## 2020-07-08 MED ORDER — METHOCARBAMOL 500 MG PO TABS
500.0000 mg | ORAL_TABLET | Freq: Four times a day (QID) | ORAL | Status: DC | PRN
  Administered 2020-07-08 – 2020-07-09 (×3): 500 mg via ORAL
  Filled 2020-07-08 (×3): qty 1

## 2020-07-08 MED ORDER — PANTOPRAZOLE SODIUM 40 MG PO TBEC
40.0000 mg | DELAYED_RELEASE_TABLET | Freq: Two times a day (BID) | ORAL | Status: DC
  Administered 2020-07-08 – 2020-07-10 (×5): 40 mg via ORAL
  Filled 2020-07-08 (×5): qty 1

## 2020-07-08 MED ORDER — HYDROMORPHONE HCL 1 MG/ML IJ SOLN: 0.5000 mg | INTRAMUSCULAR | Status: DC | PRN

## 2020-07-08 NOTE — Progress Notes (Addendum)
     Subjective: No complaints.  Tolerating liquids.  No nausea/vomiting.    ROS: See above, otherwise other systems negative  Objective: Vital signs in last 24 hours: Temp:  [97.3 F (36.3 C)-98.5 F (36.9 C)] 98.1 F (36.7 C) (04/18 0841) Pulse Rate:  [61-77] 77 (04/18 0841) Resp:  [13-20] 20 (04/18 0841) BP: (127-178)/(78-104) 169/104 (04/18 0841) SpO2:  [92 %-99 %] 97 % (04/18 0841) Last BM Date: 07/09/20  Intake/Output from previous day: 04/17 0701 - 04/18 0700 In: 2500 [P.O.:500] Out: 51 [Drains:50; Stool:1] Intake/Output this shift: No intake/output data recorded.  PE: Heart: regular Lungs: CTAB Abd: soft, appropriately tender, midline wound is clean and packed, JP drain with serous output, g-tube in place and to gravity with almost essentially no output GU: foley in place with straw colored urine, 700cc  Lab Results:  No results for input(s): WBC, HGB, HCT, PLT in the last 72 hours. BMET No results for input(s): NA, K, CL, CO2, GLUCOSE, BUN, CREATININE, CALCIUM in the last 72 hours. PT/INR No results for input(s): LABPROT, INR in the last 72 hours. CMP     Component Value Date/Time   NA 142 07/06/2020 0239   NA 142 04/16/2016 1732   K 3.5 07/06/2020 0239   CL 114 (H) 07/06/2020 0239   CO2 21 (L) 07/06/2020 0239   GLUCOSE 80 07/06/2020 0239   BUN 11 07/06/2020 0239   BUN 12 04/16/2016 1732   CREATININE 0.78 07/06/2020 0239   CALCIUM 8.1 (L) 07/06/2020 0239   PROT 7.7 07/04/2020 1427   ALBUMIN 4.1 07/04/2020 1427   AST 24 07/04/2020 1427   ALT 17 07/04/2020 1427   ALKPHOS 76 07/04/2020 1427   BILITOT 0.9 07/04/2020 1427   GFRNONAA >60 07/06/2020 0239   GFRAA >60 04/01/2018 2122   Lipase     Component Value Date/Time   LIPASE 23 07/04/2020 1427       Studies/Results: No results found.  Anti-infectives: Anti-infectives (From admission, onward)   Start     Dose/Rate Route Frequency Ordered Stop   07/04/20 1715  cefTRIAXone (ROCEPHIN) 2 g  in sodium chloride 0.9 % 100 mL IVPB  Status:  Discontinued        2 g 200 mL/hr over 30 Minutes Intravenous Every 24 hours 07/04/20 1703 07/08/20 0824   07/04/20 1715  metroNIDAZOLE (FLAGYL) IVPB 500 mg  Status:  Discontinued        500 mg 100 mL/hr over 60 Minutes Intravenous Every 8 hours 07/04/20 1703 07/08/20 0824       Assessment/Plan GERD - protonix  Alyssa Peterson is a 70 year old female s/p ex lap with reduction of incarcerated paraesophageal hernia, repair of gastric perforation x2, placement of g-tube and JP drain, Dr. Fredricka Bonine on 4/13. -protonix -UGI 4/15 negative for leak. -continue JP drain, possibly remove just prior to discharge, vs. Early follow up outpatient -BID dressing changes to midline wound -IVF -mobilize -pulm toilet/IS -Consult nutrition for full liquid diet education  FEN - FLD w/ supplements VTE - Lovenox ID - discontinue Rocephin/Flagyl now POD 4  Dispo - plan for discharge home with home health on Monday     Alyssa Ore, MD [facility] Surgery 07/09/2020, 9:24 AM Please see Amion for pager number during day hours 7:00am-4:30pm or 7:00am -11:30am on weekends

## 2020-07-09 ENCOUNTER — Other Ambulatory Visit (HOSPITAL_COMMUNITY): Payer: Self-pay

## 2020-07-09 LAB — CBC
HCT: 34.5 % — ABNORMAL LOW (ref 36.0–46.0)
Hemoglobin: 11.4 g/dL — ABNORMAL LOW (ref 12.0–15.0)
MCH: 29.3 pg (ref 26.0–34.0)
MCHC: 33 g/dL (ref 30.0–36.0)
MCV: 88.7 fL (ref 80.0–100.0)
Platelets: 199 10*3/uL (ref 150–400)
RBC: 3.89 MIL/uL (ref 3.87–5.11)
RDW: 13.9 % (ref 11.5–15.5)
WBC: 4 10*3/uL (ref 4.0–10.5)
nRBC: 0 % (ref 0.0–0.2)

## 2020-07-09 MED ORDER — ENSURE ENLIVE PO LIQD
237.0000 mL | Freq: Three times a day (TID) | ORAL | Status: DC
  Administered 2020-07-09 (×2): 237 mL via ORAL

## 2020-07-09 MED ORDER — OXYCODONE HCL 5 MG PO TABS
5.0000 mg | ORAL_TABLET | Freq: Four times a day (QID) | ORAL | 0 refills | Status: DC | PRN
  Filled 2020-07-09: qty 15, 4d supply, fill #0

## 2020-07-09 MED ORDER — HYDROMORPHONE HCL 1 MG/ML IJ SOLN: 0.5000 mg | INTRAMUSCULAR | Status: DC | PRN

## 2020-07-09 MED ORDER — OXYCODONE HCL 5 MG PO TABS
5.0000 mg | ORAL_TABLET | ORAL | Status: DC | PRN
Start: 2020-07-09 — End: 2020-07-10

## 2020-07-09 MED ORDER — ACETAMINOPHEN 325 MG PO TABS: 650.0000 mg | ORAL_TABLET | Freq: Four times a day (QID) | ORAL | 0 refills | Status: DC | PRN

## 2020-07-09 MED ORDER — PANTOPRAZOLE SODIUM 40 MG PO TBEC
40.0000 mg | DELAYED_RELEASE_TABLET | Freq: Two times a day (BID) | ORAL | 2 refills | Status: DC
  Filled 2020-07-09: qty 60, 30d supply, fill #0

## 2020-07-09 MED ORDER — HYDRALAZINE HCL 20 MG/ML IJ SOLN: 10.0000 mg | Freq: Three times a day (TID) | INTRAMUSCULAR | Status: DC | PRN

## 2020-07-09 MED ORDER — ACETAMINOPHEN 325 MG PO TABS: 650.0000 mg | ORAL_TABLET | Freq: Four times a day (QID) | ORAL | Status: DC | PRN

## 2020-07-09 MED ORDER — METHOCARBAMOL 500 MG PO TABS
500.0000 mg | ORAL_TABLET | Freq: Four times a day (QID) | ORAL | 0 refills | Status: DC | PRN
  Filled 2020-07-09: qty 45, 12d supply, fill #0

## 2020-07-09 MED ORDER — ONDANSETRON HCL 4 MG PO TABS
4.0000 mg | ORAL_TABLET | Freq: Four times a day (QID) | ORAL | 0 refills | Status: DC
  Filled 2020-07-09: qty 12, 3d supply, fill #0

## 2020-07-09 NOTE — Progress Notes (Addendum)
Daughter at bedside who has been waiting to find out if she will be able to get [facility] nurse.  Daughter will see if aunt and father can come in the am for wound care teaching.   Spouse states they have another policy and wants to see if it covers anything as far as HH.

## 2020-07-09 NOTE — Discharge Instructions (Addendum)
Wet to Dry WOUND CARE: - Change dressing twice daily - Supplies: sterile saline, kerlex, scissors, ABD pads, tape  1. Remove dressing and all packing carefully, moistening with sterile saline as needed to avoid packing/internal dressing sticking to the wound. 2.   Clean edges of skin around the wound with water/gauze, making sure there is no tape debris or leakage left on skin that could cause skin irritation or breakdown. 3.   Dampen and clean kerlex with sterile saline and pack wound from wound base to skin level, making sure to take note of any possible areas of wound tracking, tunneling and packing appropriately. Wound can be packed loosely. Trim kerlex to size if a whole kerlex is not required. 4.   Cover wound with a dry ABD pad and secure with tape.  5.   Write the date/time on the dry dressing/tape to better track when the last dressing change occurred. - apply any skin protectant/powder if recommended by clinician to protect skin/skin folds. - change dressing as needed if leakage occurs, wound gets contaminated, or patient requests to shower. - You may shower daily with wound open and following the shower the wound should be dried and a clean dressing placed.  - Medical grade tape as well as packing supplies can be found at [employer] on [address] or [employer] on [address]. The remaining supplies can be found at your local drug store, [employer] etc.   CC[facility]      [facility], [place] [phone]  OPEN ABDOMINAL SURGERY: POST OP INSTRUCTIONS  Please take your protonix twice daily as instructed  Always review your discharge instruction sheet given to you by the facility where your surgery was performed.  IF YOU HAVE DISABILITY OR FAMILY LEAVE FORMS, YOU MUST BRING THEM TO THE OFFICE FOR PROCESSING.  PLEASE DO NOT GIVE THEM TO YOUR DOCTOR.  1. A prescription for pain medication may be given to you upon discharge.  Take your pain medication as  prescribed, if needed.  If narcotic pain medicine is not needed, then you may take acetaminophen (Tylenol) or ibuprofen (Advil) as needed. 2. Take your usually prescribed medications unless otherwise directed. 3. If you need a refill on your pain medication, please contact your pharmacy. They will contact our office to request authorization.  Prescriptions will not be filled after 5pm or on week-ends. 4. You should follow a light diet the first few days after arrival home, such as soup and crackers, pudding, etc.unless your doctor has advised otherwise. A high-fiber, low fat diet can be resumed as tolerated.   Be sure to include lots of fluids daily. Most patients will experience some swelling and bruising on the chest and neck area.  Ice packs will help.  Swelling and bruising can take several days to resolve 5. Most patients will experience some swelling and bruising in the area of the incision. Ice pack will help. Swelling and bruising can take several days to resolve..  6. It is common to experience some constipation if taking pain medication after surgery.  Increasing fluid intake and taking a stool softener will usually help or prevent this problem from occurring.  A mild laxative (Milk of Magnesia or Miralax) should be taken according to package directions if there are no bowel movements after 48 hours. 7.  You may have steri-strips (small skin tapes) in place directly over the incision.  These strips should be left on the skin for 7-10 days.  If your surgeon used skin  glue on the incision, you may shower in 24 hours.  The glue will flake off over the next 2-3 weeks.  Any sutures or staples will be removed at the office during your follow-up visit. You may find that a light gauze bandage over your incision may keep your staples from being rubbed or pulled. You may shower and replace the bandage daily. 8. ACTIVITIES:  You may resume regular (light) daily activities beginning the next day--such as daily  self-care, walking, climbing stairs--gradually increasing activities as tolerated.  You may have sexual intercourse when it is comfortable.  Refrain from any heavy lifting or straining until approved by your doctor. a. You may drive when you no longer are taking prescription pain medication, you can comfortably wear a seatbelt, and you can safely maneuver your car and apply brakes b. Return to Work: ___________________________________ 9. You should see your doctor in the office for a follow-up appointment approximately two weeks after your surgery.  Make sure that you call for this appointment within a day or two after you arrive home to insure a convenient appointment time. OTHER INSTRUCTIONS:  _____________________________________________________________ _____________________________________________________________  WHEN TO CALL YOUR DOCTOR: 1. Fever over 101.0 2. Inability to urinate 3. Nausea and/or vomiting 4. Extreme swelling or bruising 5. Continued bleeding from incision. 6. Increased pain, redness, or drainage from the incision. 7. Difficulty swallowing or breathing 8. Muscle cramping or spasms. 9. Numbness or tingling in hands or feet or around lips.  The clinic staff is available to answer your questions during regular business hours.  Please don't hesitate to call and ask to speak to one of the nurses if you have concerns.  For further questions, please visit [email or web address]   Full Liquid Diet Nutrition Therapy   The full liquid diet includes mostly liquids (including milk) and some foods with small amounts of fiber. The full liquid diet can provide many of the nutrients your body needs, but it may not give enough vitamins, minerals, and fiber.  A fluid is anything that is liquid or anything that would melt if left at room temperature. You will need to count these foods and liquids--including any liquid used to take medication--as part of your daily fluid  intake. Some examples are:  Coffee, tea, and other hot beverages  Gelatin  Gravy  Ice cream, sherbet, sorbet  Ice cubes, ice chips  Milk, liquid creamer  Nutritional supplements  Popsicles  Vegetable and fruit juices; fluid in canned fruit (fruit itself is not allowed)  Yogurt (without nuts, seeds, or fruit)  Soft drinks, lemonade, limeade  Soups (strained)  Syrup This diet should only be used temporarily during your recovery until it is safe for you to eat regular foods. Your RDN can help establish a nutritionally-balanced full liquid meal plan, if needed.     Tips  Determine the which foods/fluids from the list are most appealing to you.  Eat or drink your favorite flavors to help you better enjoy this diet.  Include milk-based or dairy-type fluids and juices. If you are lactose intolerant, choose nut and seed milks.  Eat 3 full liquid meals throughout the day and include a snack time between each meal.  Drink nutritional shakes in 6-8 ounce servings as part of a meal or between meals to make sure you're taking in enough calories. You can make fortified shakes for yourself or buy them premade at a store.  Foods Recommended Food Group Foods Recommended  Grains Thin hot cereal, such as cream of wheat  Dairy Milk: Nonfat, 1%, 2%, whole Soy milk, almond milk, rice milk, coconut milk, cashew milk Milkshakes Yogurt Custard Pudding  Vegetables Vegetable juice with or without pulp Thin, pureed vegetable soups  Fruits Translucent fruit juices without pulp (apple, cranberry, grape)  Oils Almond, avocado, canola, cashew, corn, grapeseed, olive, safflower, sesame, soybean, sunflower Butter (melted) Margarine (melted) that does not contain trans fat (read the product label)  Other Flavored gelatin (any flavor) Strained cream soups Chicken, beef, or vegetable broths Popsicle  Beverages Water Ice Soda Tea Coffee Nutritional supplements or shakes   A  high-protein shake should contain at least 8-10 grams of protein per serving. Read product labels to find a shake that is high in protein. If you are preparing the shake at home, you can increase the amount of protein by adding protein powder, non-fat dry milk powder, yogurt, or low-fat milk.     Foods Not Recommended Food Group Foods Not Recommended  Grains All grain foods including whole grains, processed grains such as pasta, rice, cold cereals, bread, snacks, and sweets that are flour-based (cakes, cookies)  Protein Foods Beef and pork (all cuts) Chicken and [place] (all cuts) Fish (all types) Nuts and nut butters (all types) Eggs (all types) All meat substitutes (such as soy and tofu) All old cuts or lunch meat (such as salami, ham) Sausage (all types)  Dairy Hard cheese Yogurt with fruit chunks  Vegetables Whole, frozen, fresh, canned varieties  Fruits Whole, frozen, fresh, canned varieties  Oils Butter Coconut oil Palm Oil Lard    Full Liquid Nutrition Therapy Sample 1-Day Menu  Breakfast 1/2 cup orange juice (without pulp) 1 cup cream of wheat 1 cup skim milk 6 ounces nonfat yogurt (without nuts, seeds, or fruit) 8 ounces coffee  Lunch 1 cup apple juice 1 cup tomato soup 1/2 cup chocolate pudding 1 cup high protein chocolate shake 8 ounces tea  Evening Meal 1/2 cup grape juice 1 cup skim milk 1 cup high-protein vanilla shake 1 cup strained, blended cream of broccoli soup 1/4 cup custard  Evening Snack 1 cup high-protein strawberry shake (without seeds)   Copyright 2022  Academy of Nutrition and Dietetics. All rights reserved.   Blood Pressure Record Sheet To take your blood pressure, you will need a blood pressure machine. You can buy a blood pressure machine (blood pressure monitor) at your clinic, drug store, or online. When choosing one, consider:  An automatic monitor that has an arm cuff.  A cuff that wraps snugly around your upper arm. You should be  able to fit only one finger between your arm and the cuff.  A device that stores blood pressure reading results.  Do not choose a monitor that measures your blood pressure from your wrist or finger. Follow your health care provider's instructions for how to take your blood pressure. To use this form:  Get one reading in the morning (a.m.) before you take any medicines.  Get one reading in the evening (p.m.) before supper.  Take at least 2 readings with each blood pressure check. This makes sure the results are correct. Wait 1-2 minutes between measurements.  Write down the results in the spaces on this form.  Repeat this once a week, or as told by your health care provider.  Make a follow-up appointment with your health care provider to discuss the results. Blood pressure log Date: _______________________  a.m. _____________________(1st reading) _____________________(2nd reading)  p.m. _____________________(1st reading) _____________________(2nd reading) Date: _______________________  a.m. _____________________(1st reading) _____________________(2nd  reading)  p.m. _____________________(1st reading) _____________________(2nd reading) Date: _______________________  a.m. _____________________(1st reading) _____________________(2nd reading)  p.m. _____________________(1st reading) _____________________(2nd reading) Date: _______________________  a.m. _____________________(1st reading) _____________________(2nd reading)  p.m. _____________________(1st reading) _____________________(2nd reading) Date: _______________________  a.m. _____________________(1st reading) _____________________(2nd reading)  p.m. _____________________(1st reading) _____________________(2nd reading) This information is not intended to replace advice given to you by your health care provider. Make sure you discuss any questions you have with your health care provider. Document Revised: 06/29/2019  Document Reviewed: 06/29/2019 Elsevier Patient Education  2021 Elsevier Inc.   How to Take Your Blood Pressure Blood pressure is a measurement of how strongly your blood is pressing against the walls of your arteries. Arteries are blood vessels that carry blood from your heart throughout your body. Your health care provider takes your blood pressure at each office visit. You can also take your own blood pressure at home with a blood pressure monitor. You may need to take your own blood pressure to:  Confirm a diagnosis of high blood pressure (hypertension).  Monitor your blood pressure over time.  Make sure your blood pressure medicine is working. Supplies needed:  Blood pressure monitor.  Dining room chair to sit in.  Table or desk.  Small notebook and pencil or pen. How to prepare To get the most accurate reading, avoid the following for 30 minutes before you check your blood pressure:  Drinking caffeine.  Drinking alcohol.  Eating.  Smoking.  Exercising. Five minutes before you check your blood pressure:  Use the bathroom and urinate so that you have an empty bladder.  Sit quietly in a dining room chair. Do not sit in a soft couch or an armchair. Do not talk. How to take your blood pressure To check your blood pressure, follow the instructions in the manual that came with your blood pressure monitor. If you have a digital blood pressure monitor, the instructions may be as follows: 1. Sit up straight in a chair. 2. Place your feet on the floor. Do not cross your ankles or legs. 3. Rest your left arm at the level of your heart on a table or desk or on the arm of a chair. 4. Pull up your shirt sleeve. 5. Wrap the blood pressure cuff around the upper part of your left arm, 1 inch (2.5 cm) above your elbow. It is best to wrap the cuff around bare skin. 6. Fit the cuff snugly around your arm. You should be able to place only one finger between the cuff and your  arm. 7. Position the cord so that it rests in the bend of your elbow. 8. Press the power button. 9. Sit quietly while the cuff inflates and deflates. 10. Read the digital reading on the monitor screen and write the numbers down (record them) in a notebook. 11. Wait 2-3 minutes, then repeat the steps, starting at step 1.   What does my blood pressure reading mean? A blood pressure reading consists of a higher number over a lower number. Ideally, your blood pressure should be below 120/80. The first ("top") number is called the systolic pressure. It is a measure of the pressure in your arteries as your heart beats. The second ("bottom") number is called the diastolic pressure. It is a measure of the pressure in your arteries as the heart relaxes. Blood pressure is classified into five stages. The following are the stages for adults who do not have a short-term serious illness or a chronic condition. Systolic pressure and diastolic  pressure are measured in a unit called mm Hg (millimeters of mercury).  Normal  Systolic pressure: below 120.  Diastolic pressure: below 80. Elevated  Systolic pressure: 120-129.  Diastolic pressure: below 80. Hypertension stage 1  Systolic pressure: 130-139.  Diastolic pressure: 80-89. Hypertension stage 2  Systolic pressure: 140 or above.  Diastolic pressure: 90 or above. You can have elevated blood pressure or hypertension even if only the systolic or only the diastolic number in your reading is higher than normal. Follow these instructions at home:  Check your blood pressure as often as recommended by your health care provider.  Check your blood pressure at the same time every day.  Take your monitor to the next appointment with your health care provider to make sure that: ? You are using it correctly. ? It provides accurate readings.  Be sure you understand what your goal blood pressure numbers are.  Tell your health care provider if you are  having any side effects from blood pressure medicine.  Keep all follow-up visits as told by your health care provider. This is important. General tips  Your health care provider can suggest a reliable monitor that will meet your needs. There are several types of home blood pressure monitors.  Choose a monitor that has an arm cuff. Do not choose a monitor that measures your blood pressure from your wrist or finger.  Choose a cuff that wraps snugly around your upper arm. You should be able to fit only one finger between your arm and the cuff.  You can buy a blood pressure monitor at most drugstores or online. Where to find more information American Heart Association: www.heart.org Contact a health care provider if:  Your blood pressure is consistently high. Get help right away if:  Your systolic blood pressure is higher than 180.  Your diastolic blood pressure is higher than 120. Summary  Blood pressure is a measurement of how strongly your blood is pressing against the walls of your arteries.  A blood pressure reading consists of a higher number over a lower number. Ideally, your blood pressure should be below 120/80.  Check your blood pressure at the same time every day.  Avoid caffeine, alcohol, smoking, and exercise for 30 minutes prior to checking your blood pressure. These agents can affect the accuracy of the blood pressure reading. This information is not intended to replace advice given to you by your health care provider. Make sure you discuss any questions you have with your health care provider. Document Revised: 03/04/2019 Document Reviewed: 03/04/2019 Elsevier Patient Education  2021 [employer]

## 2020-07-09 NOTE — Evaluation (Signed)
Physical Therapy Evaluation Patient Details Name: Alyssa Peterson MRN: [id number] DOB: 1951-02-12 Today's Date: 07/09/2020   History of Present Illness  Pt is 70 yo female who was admitted on 07/04/20 with abdominal pain.  Pt found to have perforated intra-abdominal viscus in the setting of large paraesophageal henia and dilated stomach. She is s/p exp lap, reduction of hernia, repair of perforation, placement of G-tube and JP drain on 07/04/20.  Clinical Impression  Pt admitted with above diagnosis.  She has been ambulating with nursing and mobility tech.  Today she demonstrated safe ambulation with supervision for 300' with RW, stairs with min A, sit to stand with supervision, and min A for bed mobility.  Limitations in mobility due to mild abdominal pain but pt expected to progress well on her own as she recovers from surgery. Do recommend RW for support, energy conservation, and pain control; but otherwise no skilled PT services indicated.  Pt will have support at home.     Follow Up Recommendations No PT follow up    Equipment Recommendations  Rolling walker with 5" wheels    Recommendations for Other Services       Precautions / Restrictions Precautions Precautions: Fall Restrictions Weight Bearing Restrictions: No      Mobility  Bed Mobility Overal bed mobility: Needs Assistance Bed Mobility: Rolling;Sidelying to Sit Rolling: Modified independent (Device/Increase time) Sidelying to sit: Min assist;HOB elevated       General bed mobility comments: used bed rails to roll, min A to lift trunk for pain control - reports plan to sleep in recliner at home for comfort    Transfers Overall transfer level: Needs assistance Equipment used: Rolling walker (2 wheeled);None Transfers: Sit to/from Stand Sit to Stand: Supervision         General transfer comment: Performed x 3; with and without RW safely  Ambulation/Gait Ambulation/Gait assistance: Supervision Gait  Distance (Feet): 300 Feet Assistive device: Rolling walker (2 wheeled);None Gait Pattern/deviations: Step-through pattern;Decreased stride length Gait velocity: decreased   General Gait Details: Preferred use of RW for stability and pain control but was able to take small steps in room without AD  Stairs Stairs: Yes Stairs assistance: Min assist Stair Management: No rails;Forwards Number of Stairs: 3 General stair comments: Pt does not have rails; performed with HHA and min A to stabilize  Wheelchair Mobility    Modified Rankin (Stroke Patients Only)       Balance Overall balance assessment: Needs assistance Sitting-balance support: No upper extremity supported Sitting balance-Leahy Scale: Normal     Standing balance support: No upper extremity supported Standing balance-Leahy Scale: Good Standing balance comment: Can perform ADLs at sink and toielting without UE support; but needs RW for ambulation                             Pertinent Vitals/Pain Pain Assessment: Faces Faces Pain Scale: Hurts little more Pain Location: Abdominal pain with supine to sit; otherwise no pain Pain Descriptors / Indicators: Discomfort;Sore Pain Intervention(s): Limited activity within patient's tolerance;Monitored during session    Home Living Family/patient expects to be discharged to:: Private residence Living Arrangements: Spouse/significant other Available Help at Discharge: Family;Available 24 hours/day (initially can provide 24 hr suppot) Type of Home: House Home Access: Stairs to enter Entrance Stairs-Rails: None Entrance Stairs-Number of Steps: 3 Home Layout: One level Home Equipment: Grab bars - tub/shower      Prior Function Level of Independence: Independent  Comments: Independent wtih ADLs, IADL, community ambulation, drivign - completely independent     Hand Dominance        Extremity/Trunk Assessment   Upper Extremity Assessment Upper  Extremity Assessment: Overall WFL for tasks assessed    Lower Extremity Assessment Lower Extremity Assessment: Overall WFL for tasks assessed    Cervical / Trunk Assessment Cervical / Trunk Assessment: Other exceptions Cervical / Trunk Exceptions: Mild trunk flexion due to abdominal incision  Communication   Communication: No difficulties  Cognition Arousal/Alertness: Awake/alert Behavior During Therapy: WFL for tasks assessed/performed Overall Cognitive Status: Within Functional Limits for tasks assessed                                        General Comments General comments (skin integrity, edema, etc.): VSS; educated on PT role and POC.  Pt agreeable that likely no further PT needs and will progress on her own as pain is controlled and heals from surgery.  Does want RW for home for support.    Exercises     Assessment/Plan    PT Assessment Patent does not need any further PT services  PT Problem List         PT Treatment Interventions      PT Goals (Current goals can be found in the Care Plan section)  Acute Rehab PT Goals Patient Stated Goal: return home PT Goal Formulation: All assessment and education complete, DC therapy    Frequency     Barriers to discharge        Co-evaluation               AM-PAC PT "6 Clicks" Mobility  Outcome Measure Help needed turning from your back to your side while in a flat bed without using bedrails?: None Help needed moving from lying on your back to sitting on the side of a flat bed without using bedrails?: A Little Help needed moving to and from a bed to a chair (including a wheelchair)?: A Little Help needed standing up from a chair using your arms (e.g., wheelchair or bedside chair)?: A Little Help needed to walk in hospital room?: A Little Help needed climbing 3-5 steps with a railing? : A Little 6 Click Score: 19    End of Session   Activity Tolerance: Patient tolerated treatment well Patient  left: in chair;with call bell/phone within reach Nurse Communication: Mobility status      Time: 1026-1050 PT Time Calculation (min) (ACUTE ONLY): 24 min   Charges:   PT Evaluation $PT Eval Low Complexity: 1 Low PT Treatments $Gait Training: 8-22 mins        Anise Salvo, PT Acute Rehab Services Pager [phone] [facility] [phone]    Rayetta Humphrey 07/09/2020, 10:59 AM

## 2020-07-09 NOTE — Plan of Care (Signed)
Plan of care was reviewed. Pt has been progressing.   Problem: Clinical Measurements: Pt has open wound on mid abdomen. Requires wet to dry dressing BID. Goal: Will remain free from infection Outcome: Progressing: remains afebrile, incision site is dry and clean, no signs of infection. JP drain has minimal serosanguinous drainage, G-tube has no drainage.    Problem: Clinical Measurements:  Goal: Respiratory complications will improve Outcome: Progressing: no acute respiratory distress noted.   Problem: Clinical Measurements: NSR on monitor, BP stable Goal: Cardiovascular complication will be avoided Outcome: Progressing:hemodynamically stable.   Problem: Activity: encouraged to ambulate more as tolerated with one assistance Goal: Risk for activity intolerance will decrease Outcome: Progressing: Pt ambulated to bathroom, well tolerated.    Problem: Nutrition: on full liquid diet. Appetite is gradually improved Goal: Adequate nutrition will be maintained Outcome: Progressing: good oral intake, denied nausea and vomiting, no abdominal pain.   Problem: Elimination: Pt stated she had watery stool 4 times on day shift. Goal: Will not experience complications related to bowel motility Outcome: Progressing: no indication of watery stool from infection. No watery stool tonight.    Problem: Pain Managment: pain was well controlled. Pt stated her pain scale was very low after Robaxin given. Goal: General experience of comfort will improve Outcome: Progressing: able to rest and sleep well at night.    Filiberto Pinks, RN

## 2020-07-09 NOTE — Plan of Care (Signed)
Nutrition Education Note  RD consulted for nutrition education regarding full liquid diet.   Spoke with pt at bedside, who reports she had episodes of early satiety and nausea and vomiting for the past 3 years after eating, which progressively got worse. She reports that she was unable to keep foods and liquids down last week. She is tolerating full liquids well, however, complains of early satiety. She shares that she will be on a full liquid diet for 8 weeks after discharge and anticipates possible discharge home today.   RD provided "Full Liquid Diet Nutrition Therapy" handout from the Academy of [employer] and [employer]etics. Reviewed patient's dietary recall. Provided examples on ways to meet nutritional needs while on full liquid. Discussed foods allowed on clear liquid and full liquid diet. Also encouraged used of nutritional supplements to help meet nutritional goals.   RD discussed why it is important for patient to adhere to diet recommendations and emphasized foods to avoid. Teach back method used.  Expect very good compliance.  Current diet order is full liquid, patient is consuming approximately 100% of meals at this time. Labs and medications reviewed. No further nutrition interventions warranted at this time. RD contact information provided. If additional nutrition issues arise, please re-consult RD.   Levada Schilling, RD, LDN, CDCES Registered Dietitian II Certified Diabetes Care and Education Specialist Please refer to [facility] for RD and/or RD on-call/weekend/after hours pager

## 2020-07-09 NOTE — Discharge Summary (Signed)
Patient ID: Alyssa Peterson [id number] February 12, 1951 70 y.o.  Admit date: 07/04/2020 Discharge date: 07/09/2020  Admitting Diagnosis: Perforated intra-abdominal viscus in setting of large paraesophageal hernia, massively dilated stomach  Discharge Diagnosis Incarcerated paraesophageal hernia with gastric volvulus and secondary perforation x 2 on the proximal aspect of the fundus/greater curvature  Consultants None   Reason for Admission: This is a very pleasant and relatively healthy 70 year old woman who presents with acute onset abdominal pain.  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  Despite removing this from her diet she continued to have symptoms.  In previous episodes, she never had any abdominal pain however this current episode, which has lasted for several days, was accompanied by a diffuse crampy uncomfortable sensation throughout her abdomen which increased in severity as the day has gone on today.  She reports that her bowel movements have been normal.  She denies any alcohol, tobacco, or NSAID use.  No previous known history of peptic ulcer disease.  Her only previous abdominal surgery was a left femoral hernia repair by Dr. Georgette Dover in 2018.  Procedures Dr. Kae Heller - Exploratory laparotomy, reduction of incarcerated paraesophageal hernia, repair of gastric perforation x2, placement of gastrostomy tube and JP drain - 07/04/2020  Hospital Course:  Alyssa Peterson presented as above and was found to have Perforated intra-abdominal viscus in setting of large paraesophageal hernia, massively dilated stomach on CT scan. She was taken to the OR emergently by Dr. Kae Heller on 4/13 where she was found to have Incarcerated paraesophageal hernia with gastric volvulus and secondary perforation x 2 on the proximal aspect of the fundus/ greater curvature  for which she underwent exploratory laparotomy, reduction of incarcerated paraesophageal hernia, repair of gastric perforation x2, placement of gastrostomy tube and JP drain. She remained on abx for 4 days post op. On post op day 2 UGI was performed and negative for leak. Diet was advanced to full liquids and tolerated. Patient was seen by dietician who provided education for FLD. Patient worked with therapies who recommended no follow up. Family, patients daughter, learned how to perform dressing changes to assist at home. The daughter reports she is familiar taking care of G-tubes. TOC met with patient to set up [facility] and PCP follow up. Patient was noted to have intermittently elevated BP's during admission. She was recommended to monitor this at home and follow up with PCP for recheck. On POD 5, the patient was voiding well, tolerating FLD with G-tube clamped, abdomen soft/NT, ambulating well with therapies (see PT note on 4/18), pain well controlled on oral medications, vital signs stable, incisions c/d/i and felt stable for discharge home. JP drain to be removed prior to discharge (discussed with RN). Follow up as arranged below.    Allergies as of 07/09/2020      Reactions   Latex Itching   hands   Penicillins Hives, Rash      Medication List    STOP taking these medications   cetirizine 10 MG tablet Commonly known as: ZyrTEC Allergy   clotrimazole 1 % cream Commonly known as: LOTRIMIN   famotidine 20 MG tablet Commonly known as: PEPCID     TAKE these medications   acetaminophen 325 MG tablet Commonly known as: TYLENOL Take 2 tablets (650 mg total) by mouth every 6 (six) hours as needed for fever or mild pain.  bismuth subsalicylate 782 MG chewable tablet Commonly known as: PEPTO BISMOL Chew 524 mg by mouth daily as needed for indigestion.   loratadine 10 MG tablet Commonly known as: CLARITIN Take 10 mg by mouth daily as needed for allergies.   methocarbamol 500 MG  tablet Commonly known as: ROBAXIN Take 1 tablet (500 mg total) by mouth every 6 (six) hours as needed for muscle spasms.   ondansetron 4 MG tablet Commonly known as: ZOFRAN Take 1 tablet (4 mg total) by mouth every 6 (six) hours.   oxyCODONE 5 MG immediate release tablet Commonly known as: Oxy IR/ROXICODONE Take 1 tablet (5 mg total) by mouth every 6 (six) hours as needed for breakthrough pain.   pantoprazole 40 MG tablet Commonly known as: PROTONIX Take 1 tablet (40 mg total) by mouth 2 (two) times daily.            Durable Medical Equipment  (From admission, onward)         Start     Ordered   07/09/20 1342  For home use only DME Walker rolling  Once       Question Answer Comment  Walker: With 5 Inch Wheels   Patient needs a walker to treat with the following condition Status post surgery      07/09/20 1341            Follow-up Information    Clovis Riley, MD. Go on 08/02/2020.   Specialty: General Surgery Why: 910am. Please arrive to your appointment 30 minutes early for paperwork. Please bring a copy of your photo ID and insurance card.  Contact information: [place] [address] [phone]        [facility]. Call in 1 day(s).   Why: Please establish care with a primary care doctor for follow up and to recheck your blood pressure. You can use the back of your insurance card to find a primary care doctor that is in network.  Contact information: [place] [phone]              Signed: Alferd Apa, [facility] 07/09/2020, 3:15 PM Please see Amion for pager number during day hours 7:00am-4:30pm

## 2020-07-09 NOTE — Progress Notes (Signed)
Pt had dark black stool one time tonight. We were unable to collect stool occult blood because Pt used the bathroom instead. We checked her previous Hb result dropped from 14.6 on 07/05/20  to 11.4 on 07/06/20. We will monitor.  Filiberto Pinks, RN

## 2020-07-09 NOTE — Progress Notes (Addendum)
5 Days Post-Op  Subjective: CC: Patient reports some pain around her incision and g-tube that is well controlled with medications (only taking prn robaxin). She is tolerating fld without n/v. G-tube appears to be to gravity but with scant output. Patient reports she had 2-3 semi-solid black stool yesterday into this morning. She is ambulating with walker to and from the bathroom and in the halls. She does not use a walker at home. Voiding.   Objective: Vital signs in last 24 hours: Temp:  [97.3 F (36.3 C)-98.5 F (36.9 C)] 98.1 F (36.7 C) (04/18 0841) Pulse Rate:  [61-77] 77 (04/18 0841) Resp:  [13-20] 20 (04/18 0841) BP: (127-178)/(78-104) 169/104 (04/18 0841) SpO2:  [92 %-99 %] 97 % (04/18 0841) Last BM Date: 07/09/20  Intake/Output from previous day: 04/17 0701 - 04/18 0700 In: 2500 [P.O.:500] Out: 51 [Drains:50; Stool:1] Intake/Output this shift: No intake/output data recorded.  PE: Gen: Awake and alert, NAD Heart: RRR Lungs: CTAB, normal rate and effort  Abd: Soft, ND, appropriately tender around midline wound and g-tube. No peritonitis. Midline wound is clean with healthy granulation tissue at the base. JP drain with serous output.  G-tube in place and to gravity with almost essentially no output  Msk: No edema of LE's b/l  Lab Results:  No results for input(s): WBC, HGB, HCT, PLT in the last 72 hours. BMET No results for input(s): NA, K, CL, CO2, GLUCOSE, BUN, CREATININE, CALCIUM in the last 72 hours. PT/INR No results for input(s): LABPROT, INR in the last 72 hours. CMP     Component Value Date/Time   NA 142 07/06/2020 0239   NA 142 04/16/2016 1732   K 3.5 07/06/2020 0239   CL 114 (H) 07/06/2020 0239   CO2 21 (L) 07/06/2020 0239   GLUCOSE 80 07/06/2020 0239   BUN 11 07/06/2020 0239   BUN 12 04/16/2016 1732   CREATININE 0.78 07/06/2020 0239   CALCIUM 8.1 (L) 07/06/2020 0239   PROT 7.7 07/04/2020 1427   ALBUMIN 4.1 07/04/2020 1427   AST 24 07/04/2020  1427   ALT 17 07/04/2020 1427   ALKPHOS 76 07/04/2020 1427   BILITOT 0.9 07/04/2020 1427   GFRNONAA >60 07/06/2020 0239   GFRAA >60 04/01/2018 2122   Lipase     Component Value Date/Time   LIPASE 23 07/04/2020 1427       Studies/Results: No results found.  Anti-infectives: Anti-infectives (From admission, onward)   Start     Dose/Rate Route Frequency Ordered Stop   07/04/20 1715  cefTRIAXone (ROCEPHIN) 2 g in sodium chloride 0.9 % 100 mL IVPB  Status:  Discontinued        2 g 200 mL/hr over 30 Minutes Intravenous Every 24 hours 07/04/20 1703 07/08/20 0824   07/04/20 1715  metroNIDAZOLE (FLAGYL) IVPB 500 mg  Status:  Discontinued        500 mg 100 mL/hr over 60 Minutes Intravenous Every 8 hours 07/04/20 1703 07/08/20 0824       Assessment/Plan GERD - Protonix HTN - PRN meds  Incarcerated paraesophageal hernia with gastric volvulus and secondary perforation x 2 on the proximal aspect of the fundus/ greater curvature - s/p ex lap with reduction of incarcerated paraesophageal hernia, repair of gastric perforation x2, placement of g-tube and JP drain, Dr. Fredricka Bonine on 4/13. - POD #5 - Continue BID protonix - Check CBC for black stools. Suspect this is 2/2 old blood from repair that is moving through. No tachycardia or hypotension.  -  UGI 4/15 negative for leak. Tolerating FLD. Clamp G-tube. Currently to gravity with essentially no output.  - Continue JP drain, possibly remove just prior to discharge. Will discuss with MD -BID dressing changes to midline wound. I have asked nursing team to teach family how to perform this at home. I have also asked TOC to meet with patient to see if she is eligible for HH - Mobilize, PT eval  - Pulm toilet/IS - Consult nutrition for full liquid diet education  FEN - FLD w/ supplements VTE - SCDs,  Lovenox ID - Rocephin/Flagyl through POD 4. None currently.   Dispo - Hopefully home early this week. Plan as above.    LOS: 5 days     Jacinto Halim , [facility] 07/09/2020, 9:42 AM Please see Amion for pager number during day hours 7:00am-4:30pm

## 2020-07-09 NOTE — TOC Transition Note (Addendum)
Transition of Care (TOC) - CM/SW Discharge Note Donn Pierini RN, BSN Transitions of Care Unit 4E- RN Case Manager See Treatment Team for direct phone #    Patient Details  Name: Alyssa Peterson MRN: [id number] Date of Birth: August 29, 1950  Transition of Care [facility]) CM/SW Contact:  Darrold Span, RN Phone Number: 07/09/2020, 4:07 PM   Clinical Narrative:    Pt from home, stable for transition home s/p exp lap. Daughter at the bedside. Referral received for [facility], DME and PCP needs for transition home. CM in to speak with pt and daughter, reviewed [facility] needs and HH arrangements per Medicare. List provided for [facility] choice Per CMS guidelines from medicare.gov website with star ratings (copy placed in shadow chart)- after review daughter has selected [place], [place], [place] and [facility] as preferred choices, after that they do not have a preference and will defer to [facility] to secure within insurance network for needed [facility] services. Daughter confirms that family will be able to assist pt with dressing changes and someone will be available for [facility] to teach regarding wound care.  Address, phone #s, and PCP all confirmed. Per pt she does not currently have a PCP- and is looking for one. Info provided to pt/daughter on how to go about finding a PCP in-network with her insurance- daughter voices that she will assist mom in finding a PCP- and plans to use patient's insurance card to find one within network. Declined any further assistance such as Management consultant #.   Discussed DME needs- pt declines need for 3n1 however does need RW for home- order has been placed for DME-RW, pt/daughter agreeable to use in house provider-[facility] for DME need.  Call made to [facility] for DME- RW to be delivered to room prior to discharge.   Calls made to [facility] agencies for [facility] referrals [facility]- unable to accept at this time due to RN staffing [facility]- msg left- call return pending [facility]- unable to accept at this time due to RN  staffing [facility]- unable to accept due to RN staffing  [facility]- unable to accept at this time [facility]- unable to accept at this time-  [facility]- unable to accept due to RN staffing [facility]- msg left- call return pending [facility])- unable to accept at this time [facility]- unable to accept at this time due to RN staffing [facility]- [facility]- unable to accept- pt lives outside of service area.   Updated pt/daughter at the bedside regarding HH barriers, also sent msg to attending team that [facility] has not been able to be secured and family may need to be educated regarding wound care with close f/u in the office. Bedside RN to f/u regarding discharge today vs tomorrow.   Final next level of care: Home w Home Health Services Barriers to Discharge: No Home Care Agency will accept this patient   Patient Goals and CMS Choice Patient states their goals for this hospitalization and ongoing recovery are:: return home and be able to eat more "regular" food, gain strength back CMS Medicare.gov Compare Post Acute Care list provided to:: Patient Choice offered to / list presented to : Patient,Adult Children  Discharge Placement                       Discharge Plan and Services   Discharge Planning Services: CM Consult,Other - See comment Post Acute Care Choice: Durable Medical Equipment,Home Health          DME Arranged: Dan Humphreys  rolling DME Agency: [facility] Date DME Agency Contacted: 07/09/20 Time DME Agency Contacted: 1540 Representative spoke with at DME Agency: Francesco Sor Arranged: RN   Date [facility] Agency Contacted: 07/09/20 Time HH Agency Contacted: 1530 Representative spoke with at [facility] Agency: multiple HH  Social Determinants of Health (SDOH) Interventions     Readmission Risk Interventions Readmission Risk Prevention Plan 07/09/2020  Post Dischage Appt Complete  Medication Screening Complete  Transportation Screening Complete  Some recent data might be  hidden

## 2020-07-09 NOTE — Progress Notes (Signed)
    Subjective: No major complaints.  Asking about wound care plan.  No nausea or vomiting. UGI yesterday negative for leak, tolerating liquids.  ROS: See above, otherwise other systems negative  Objective: Vital signs in last 24 hours: Temp:  [97.3 F (36.3 C)-98.5 F (36.9 C)] 98.1 F (36.7 C) (04/18 0841) Pulse Rate:  [61-77] 77 (04/18 0841) Resp:  [13-20] 20 (04/18 0841) BP: (127-178)/(78-104) 169/104 (04/18 0841) SpO2:  [92 %-99 %] 97 % (04/18 0841) Last BM Date: 07/09/20  Intake/Output from previous day: 04/17 0701 - 04/18 0700 In: 2500 [P.O.:500] Out: 51 [Drains:50; Stool:1] Intake/Output this shift: No intake/output data recorded.  PE: Heart: regular Lungs: CTAB Abd: soft, appropriately tender, midline wound is clean and packed, JP drain with serous output, g-tube in place and to gravity with almost essentially no output GU: Urinating  Lab Results:  No results for input(s): WBC, HGB, HCT, PLT in the last 72 hours. BMET No results for input(s): NA, K, CL, CO2, GLUCOSE, BUN, CREATININE, CALCIUM in the last 72 hours. PT/INR No results for input(s): LABPROT, INR in the last 72 hours. CMP     Component Value Date/Time   NA 142 07/06/2020 0239   NA 142 04/16/2016 1732   K 3.5 07/06/2020 0239   CL 114 (H) 07/06/2020 0239   CO2 21 (L) 07/06/2020 0239   GLUCOSE 80 07/06/2020 0239   BUN 11 07/06/2020 0239   BUN 12 04/16/2016 1732   CREATININE 0.78 07/06/2020 0239   CALCIUM 8.1 (L) 07/06/2020 0239   PROT 7.7 07/04/2020 1427   ALBUMIN 4.1 07/04/2020 1427   AST 24 07/04/2020 1427   ALT 17 07/04/2020 1427   ALKPHOS 76 07/04/2020 1427   BILITOT 0.9 07/04/2020 1427   GFRNONAA >60 07/06/2020 0239   GFRAA >60 04/01/2018 2122   Lipase     Component Value Date/Time   LIPASE 23 07/04/2020 1427       Studies/Results: No results found.  Anti-infectives: Anti-infectives (From admission, onward)   Start     Dose/Rate Route Frequency Ordered Stop   07/04/20  1715  cefTRIAXone (ROCEPHIN) 2 g in sodium chloride 0.9 % 100 mL IVPB  Status:  Discontinued        2 g 200 mL/hr over 30 Minutes Intravenous Every 24 hours 07/04/20 1703 07/08/20 0824   07/04/20 1715  metroNIDAZOLE (FLAGYL) IVPB 500 mg  Status:  Discontinued        500 mg 100 mL/hr over 60 Minutes Intravenous Every 8 hours 07/04/20 1703 07/08/20 0824       Assessment/Plan GERD - protonix  Alyssa Peterson is a 70 year old female s/p ex lap with reduction of incarcerated paraesophageal hernia, repair of gastric perforation x2, placement of g-tube and JP drain, Dr. Fredricka Bonine on 4/13. -protonix -UGI 4/15 negative for leak, now tolerating full liquid diet -continue JP drain -BID dressing changes to midline wound -IVF -mobilize -pulm toilet/IS  FEN - Full liquid diet VTE - Lovenox ID - Rocephin/Flagyl     Quentin Ore, MD [facility] Surgery 07/09/2020, 9:25 AM Please see Amion for pager number during day hours 7:00am-4:30pm or 7:00am -11:30am on weekends

## 2020-07-10 ENCOUNTER — Other Ambulatory Visit (HOSPITAL_COMMUNITY): Payer: Self-pay

## 2020-07-10 NOTE — Care Management Important Message (Signed)
Important Message  Patient Details  Name: Alyssa Peterson MRN: [id number] Date of Birth: [birth year]   Medicare Important Message Given:  Yes pt. already D/C mail IM letter to pt. home address.     Sloan Leiter Smith 07/10/2020, 12:24 PM

## 2020-07-10 NOTE — Progress Notes (Signed)
Discharge summary reviewed with patient & spouse.  CCMD notified.  IV access discontinued.  Medications delivered by [facility] pharmacy.  Rolling walker delivered and taken home yesterday.   Patient assisted to private vehicle upon discharge.

## 2020-07-10 NOTE — Progress Notes (Signed)
Family teaching for wound care to midline incision with w-t-d instructions, RU abd JP drain site, and G-tube.   Niece and Spouse present.  All questions answered.  Per [facility], supplies given for wound care- 7-10 day supply.

## 2020-07-10 NOTE — TOC Progression Note (Signed)
Transition of Care (TOC) - Progression Note  Donn Pierini RN, BSN Transitions of Care Unit 4E- RN Case Manager See Treatment Team for direct phone #    Patient Details  Name: Alyssa Peterson MRN: [id number] Date of Birth: Jun 16, 1950  Transition of Care [facility]) CM/SW Contact  Zenda Alpers, Lenn Sink, RN Phone Number: 07/10/2020, 10:35 AM  Clinical Narrative:    Family here this am for wound care teaching by the bedside RN,  Follow up done with remaining [facility] agencies-  [facility]- unable to accept due to staffing. [facility]- unable to accept   At this time- all efforts have been made to secure a [facility] agency and have been unable to find an agency that will accept pt for [facility] needs.  Pt and family aware. Pt and family will f/u with MD office if they have any concerns regarding wound.    Expected Discharge Plan: Home w Home Health Services Barriers to Discharge: No Home Care Agency will accept this patient  Expected Discharge Plan and Services Expected Discharge Plan: Home w Home Health Services   Discharge Planning Services: CM Consult,Other - See comment Post Acute Care Choice: Durable Medical Equipment,Home Health Living arrangements for the past 2 months: Single Family Home Expected Discharge Date: 07/10/20               DME Arranged: Dan Humphreys rolling DME Agency: [facility] Date DME Agency Contacted: 07/09/20 Time DME Agency Contacted: 1540 Representative spoke with at DME Agency: Francesco Sor Arranged: RN   Date HH Agency Contacted: 07/09/20 Time HH Agency Contacted: 1530 Representative spoke with at [facility] Agency: multiple HH   Social Determinants of Health (SDOH) Interventions    Readmission Risk Interventions Readmission Risk Prevention Plan 07/09/2020  Post Dischage Appt Complete  Medication Screening Complete  Transportation Screening Complete  Some recent data might be hidden

## 2020-07-10 NOTE — Progress Notes (Signed)
6 Days Post-Op  Subjective: CC: Patient reports that she is doing well. Tolerating FLD without increased abdominal pain, n/v. Some soreness around her incision but otherwise no abdominal pain. Voiding. BM yesterday. Mobilized with therapies yesterday TOC working on helping get PCP. Additonal family to come at 10am to learn WTD dressing changes.   Objective: Vital signs in last 24 hours: Temp:  [98.1 F (36.7 C)-98.9 F (37.2 C)] 98.9 F (37.2 C) (04/19 0819) Pulse Rate:  [66-77] 72 (04/19 0819) Resp:  [17-21] 18 (04/19 0819) BP: (139-169)/(91-104) 154/95 (04/19 0819) SpO2:  [97 %-99 %] 99 % (04/19 0819) Last BM Date: 07/09/20  Intake/Output from previous day: 04/18 0701 - 04/19 0700 In: 290 [P.O.:290] Out: 20 [Drains:20] Intake/Output this shift: No intake/output data recorded.  PE: Gen: Awake and alert, NAD Heart: RRR Lungs: CTAB, normal rate and effort  Abd: Soft, ND, appropriately tender around midline wound and g-tube. No peritonitis. Midline wound is clean with healthy granulation tissue at the base. JP drain with serous output.  G-tube clamped.  Msk: No calf tenderness b/l. Calves equal in size b/l. DP 2+  Lab Results:  Recent Labs    07/09/20 0947  WBC 4.0  HGB 11.4*  HCT 34.5*  PLT 199   BMET No results for input(s): NA, K, CL, CO2, GLUCOSE, BUN, CREATININE, CALCIUM in the last 72 hours. PT/INR No results for input(s): LABPROT, INR in the last 72 hours. CMP     Component Value Date/Time   NA 142 07/06/2020 0239   NA 142 04/16/2016 1732   K 3.5 07/06/2020 0239   CL 114 (H) 07/06/2020 0239   CO2 21 (L) 07/06/2020 0239   GLUCOSE 80 07/06/2020 0239   BUN 11 07/06/2020 0239   BUN 12 04/16/2016 1732   CREATININE 0.78 07/06/2020 0239   CALCIUM 8.1 (L) 07/06/2020 0239   PROT 7.7 07/04/2020 1427   ALBUMIN 4.1 07/04/2020 1427   AST 24 07/04/2020 1427   ALT 17 07/04/2020 1427   ALKPHOS 76 07/04/2020 1427   BILITOT 0.9 07/04/2020 1427   GFRNONAA >60  07/06/2020 0239   GFRAA >60 04/01/2018 2122   Lipase     Component Value Date/Time   LIPASE 23 07/04/2020 1427       Studies/Results: No results found.  Anti-infectives: Anti-infectives (From admission, onward)   Start     Dose/Rate Route Frequency Ordered Stop   07/04/20 1715  cefTRIAXone (ROCEPHIN) 2 g in sodium chloride 0.9 % 100 mL IVPB  Status:  Discontinued        2 g 200 mL/hr over 30 Minutes Intravenous Every 24 hours 07/04/20 1703 07/08/20 0824   07/04/20 1715  metroNIDAZOLE (FLAGYL) IVPB 500 mg  Status:  Discontinued        500 mg 100 mL/hr over 60 Minutes Intravenous Every 8 hours 07/04/20 1703 07/08/20 0824       Assessment/Plan GERD - Protonix HTN - PRN meds. PCP follow up ABL anemia - hgb stable at 11.4  Incarcerated paraesophageal hernia with gastric volvulus and secondary perforationx 2on the proximal aspect of thefundus/greater curvature - s/p ex lap with reduction of incarcerated paraesophageal hernia, repair of gastric perforation x2, placement of g-tube and JP drain, Dr. Fredricka Bonine on4/13. - POD #6 - Continue BID protonix - UGI4/15 negative for leak. Tolerating FLD w/ G-tube clamped. Appreciate dieticians assistance with FLD education.  - D/c JP drain -BID dressing changes to midline wound. Patients daughter learned how to perfrom yesterday. RN reports  2 family members are coming to learn today at 56. TOC working on securing HH if possible.  - Mobilize, PT rec no follow up - Pulm toilet/IS - Patient is stable for d/c. Please see discharge summary from yesterday.   FEN -FLD w/ supplements VTE - SCDs,  Lovenox ID -Rocephin/Flagylthrough POD 4. None currently.   Dispo - Discharge home today.    LOS: 6 days    Jacinto Halim , [facility] 07/10/2020, 8:22 AM Please see Amion for pager number during day hours 7:00am-4:30pm

## 2020-07-10 NOTE — Progress Notes (Signed)
JP drain discontinued without difficulty.  Small amount serous drainage noted.  Dry dressing applied.  Patient tolerated well.

## 2020-07-13 ENCOUNTER — Other Ambulatory Visit (HOSPITAL_COMMUNITY): Payer: Self-pay

## 2020-08-22 NOTE — Progress Notes (Signed)
Patient ID: Alyssa Peterson, female   DOB: 11/16/1950, 70 y.o.   MRN: [id number]     Alyssa Peterson, is a 70 y.o. female  FBP:[id number]  [id number]:[id number]  DOB - 09-28-1950  Subjective:  Chief Complaint and HPI: Alyssa Peterson is a 70 y.o. female here today to establish care and for a follow up visit After hospitalization 4/13-4/18/2022 for abdominal perforation and repair.  She is feeling great!  Appetite is good.  No N/V/D.  She has not been taking pantoprazole bc she ran out.  She still has JP drain in place.  She has an appt next week to have that removed.  No blood in stool.  No abdominal pain.  She is back to regular diet.    From discharge summary: Admit date: 07/04/2020 Discharge date: 07/09/2020  Admitting Diagnosis: Perforated intra-abdominal viscus in setting of large paraesophageal hernia, massively dilated stomach  Discharge Diagnosis Incarcerated paraesophageal hernia with gastric volvulus and secondary perforationx 2on the proximal aspect of thefundus/greater curvature  Consultants None   Reason for Admission: This is a very pleasant and relatively healthy 70 year old woman who presents with acute onset abdominal pain. 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 Despite removing this from her diet she continued to have symptoms. In previous episodes, she never had any abdominal pain however this current episode, which has lasted for several days, was accompanied by a diffuse crampy uncomfortable sensation throughout her abdomen which increased in severity as the day has gone on today. She reports that her bowel movements have been normal. She denies any alcohol, tobacco, or NSAID use. No previous known history of peptic ulcer disease. Her only previous abdominal surgery was a left femoral hernia repair by Dr. Georgette Dover in  2018.  Procedures Dr. Kae Heller - Exploratory laparotomy, reduction of incarcerated paraesophageal hernia, repair of gastric perforation x2, placement of gastrostomy tube and JP drain - 07/04/2020  Hospital Course:  Saleha Kalp Longbottom presented as above and was found to have Perforated intra-abdominal viscus in setting of large paraesophageal hernia, massively dilated stomach on CT scan. She was taken to the OR emergently by Dr. Kae Heller on 4/13 where she was found to have Incarcerated paraesophageal hernia with gastric volvulus and secondary perforationx 2on the proximal aspect of thefundus/greater curvature for which she underwent exploratory laparotomy, reduction of incarcerated paraesophageal hernia, repair of gastric perforation x2, placement of gastrostomy tube and JP drain. She remained on abx for 4 days post op. On post op day 2 UGI was performed and negative for leak. Diet was advanced to full liquids and tolerated. Patient was seen by dietician who provided education for FLD. Patient worked with therapies who recommended no follow up. Family, patients daughter, learned how to perform dressing changes to assist at home. The daughter reports she is familiar taking care of G-tubes. TOC met with patient to set up [facility] and PCP follow up. Patient was noted to have intermittently elevated BP's during admission. She was recommended to monitor this at home and follow up with PCP for recheck. OnPOD 5, the patient was voiding well, tolerating FLD with G-tube clamped, abdomen soft/NT, ambulating well with therapies (see PT note on 4/18), pain well controlled on oral medications, vital signs stable, incisions c/d/i and felt stable for discharge home. JP drain to be removed prior to discharge (discussed with RN). Follow up as arranged below.  ED/Hospital notes reviewed.    ROS:   Constitutional:  No f/c, No night sweats, No unexplained weight loss. EENT:  No vision changes, No blurry vision, No hearing  changes. No mouth, throat, or ear problems.  Respiratory: No cough, No SOB Cardiac: No CP, no palpitations GI:  No abd pain, No N/V/D. GU: No Urinary s/sx Musculoskeletal: No joint pain Neuro: No headache, no dizziness, no motor weakness.  Skin: No rash Endocrine:  No polydipsia. No polyuria.  Psych: Denies SI/HI  No problems updated.  ALLERGIES: Allergies  Allergen Reactions  . Latex Itching    hands  . Penicillins Hives and Rash    PAST MEDICAL HISTORY: Past Medical History:  Diagnosis Date  . Anemia   . GERD (gastroesophageal reflux disease)   . Inguinal hernia    left    MEDICATIONS AT HOME: Prior to Admission medications   Medication Sig Start Date End Date Taking? Authorizing Provider  acetaminophen (TYLENOL) 325 MG tablet Take 2 tablets (650 mg total) by mouth every 6 (six) hours as needed for fever or mild pain. Patient not taking: Reported on 08/23/2020 07/09/20   Jillyn Ledger, PA-C  loratadine (CLARITIN) 10 MG tablet Take 10 mg by mouth daily as needed for allergies. Patient not taking: Reported on 08/23/2020    [provider]  pantoprazole (PROTONIX) 40 MG tablet Take 1 tablet (40 mg total) by mouth daily. 08/23/20   Argentina Donovan, PA-C     Objective:  EXAM:   Vitals:   08/23/20 1000  BP: 134/89  Pulse: 82  Resp: 16  SpO2: 92%  Weight: 133 lb 3.2 oz (60.4 kg)    General appearance : A&OX3. NAD. Non-toxic-appearing HEENT: Atraumatic and Normocephalic.  PERRLA. EOM intact.  TM clear B. Mouth-MMM, post pharynx WNL w/o erythema, No PND. Neck: supple, no JVD. No cervical lymphadenopathy. No thyromegaly Chest/Lungs:  Breathing-non-labored, Good air entry bilaterally, breath sounds normal without rales, rhonchi, or wheezing  CVS: S1 S2 regular, no murmurs, gallops, rubs  Abdomen: Bowel sounds present, Non tender and not distended with no gaurding, rigidity or rebound.  No erythema around drain site.   Extremities: Bilateral Lower Ext shows  no edema, both legs are warm to touch with = pulse throughout Neurology:  CN II-XII grossly intact, Non focal.   Psych:  TP linear. J/I WNL. Normal speech. Appropriate eye contact and affect.  Skin:  No Rash  Data Review No results found for: HGBA1C   Assessment & Plan   1. Perforated abdominal viscus Repaired-clinically stable.  Keep f/up with surgery.  At least stay on protonix until told otherwise by surgery - pantoprazole (PROTONIX) 40 MG tablet; Take 1 tablet (40 mg total) by mouth daily.  Dispense: 30 tablet; Refill: 1 - CBC with Differential/Platelet  2. Hyperglycemia I have had a lengthy discussion and provided education about insulin resistance and the intake of too much sugar/refined carbohydrates.  I have advised the patient to work at a goal of eliminating sugary drinks, candy, desserts, sweets, refined sugars, processed foods, and white carbohydrates.  The patient expresses understanding.  - Hemoglobin [name]  3. Hypokalemia - Basic metabolic panel  4. Hypomagnesemia - Magnesium  5. Hospital discharge follow-up Doing well.    Patient have been counseled extensively about nutrition and exercise  Return in about 2 months (around 10/23/2020) for assign PCP.  The patient was given clear instructions to go to ER or return to medical center if symptoms don't improve, worsen or new  problems develop. The patient verbalized understanding. The patient was told to call to get lab results if they haven't heard anything in the next week.     Freeman Caldron, PA-C [facility] [place], [place]   08/23/2020, 10:18 AM

## 2020-08-23 ENCOUNTER — Other Ambulatory Visit: Payer: Self-pay

## 2020-08-23 ENCOUNTER — Other Ambulatory Visit: Payer: Self-pay | Admitting: Physician Assistant

## 2020-08-23 ENCOUNTER — Ambulatory Visit: Payer: Medicare Other | Attending: Physician Assistant | Admitting: Physician Assistant

## 2020-08-23 ENCOUNTER — Encounter: Payer: Self-pay | Admitting: Physician Assistant

## 2020-08-23 VITALS — BP 134/89 | HR 82 | Resp 16 | Wt 133.2 lb

## 2020-08-23 DIAGNOSIS — R739 Hyperglycemia, unspecified: Secondary | ICD-10-CM

## 2020-08-23 DIAGNOSIS — E876 Hypokalemia: Secondary | ICD-10-CM

## 2020-08-23 DIAGNOSIS — R198 Other specified symptoms and signs involving the digestive system and abdomen: Secondary | ICD-10-CM | POA: Diagnosis not present

## 2020-08-23 DIAGNOSIS — Z09 Encounter for follow-up examination after completed treatment for conditions other than malignant neoplasm: Secondary | ICD-10-CM

## 2020-08-23 MED ORDER — PANTOPRAZOLE SODIUM 40 MG PO TBEC: 40.0000 mg | DELAYED_RELEASE_TABLET | Freq: Every day | ORAL | 1 refills | Status: DC

## 2020-08-23 NOTE — Telephone Encounter (Signed)
Requested medication (s) are due for refill today:   Yes however pt requesting a 90 day supply  Requested medication (s) are on the active medication list:   Yes  Future visit scheduled:   Yes in 2 months with Zelda   Last ordered: 08/23/2020 by Meredeth Ide.  Returned because 2 months is ordered however she is asking for a 90 day supply.   Provider to review for a 90 day supply.   Requested Prescriptions  Pending Prescriptions Disp Refills   pantoprazole (PROTONIX) 40 MG tablet [Pharmacy Med Name: PANTOPRAZOLE 40MG  TABLETS] 90 tablet     Sig: TAKE 1 TABLET(40 MG) BY MOUTH DAILY      Gastroenterology: Proton Pump Inhibitors Passed - 08/23/2020 10:35 AM      Passed - Valid encounter within last 12 months    Recent Outpatient Visits           Today Perforated abdominal viscus   [facility] [place], [place], [place]   2 years ago Paronychia of finger of right hand   [facility] Care at [facility], [facility], [place]   3 years ago Need for vaccination   Primary Care at [facility], [facility], [place]   4 years ago Encounter for health maintenance examination   Primary Care at [facility], [facility], [place]   4 years ago Paronychia of finger of left hand   Primary Care at [place], Sharlene Motts, [place]       Future Appointments             In 2 months Manus Rudd, NP [facility]

## 2020-08-24 LAB — CBC WITH DIFFERENTIAL/PLATELET
Basophils Absolute: 0 10*3/uL (ref 0.0–0.2)
Basos: 1 %
EOS (ABSOLUTE): 0.3 10*3/uL (ref 0.0–0.4)
Eos: 6 %
Hematocrit: 41.6 % (ref 34.0–46.6)
Hemoglobin: 13.3 g/dL (ref 11.1–15.9)
Immature Grans (Abs): 0 10*3/uL (ref 0.0–0.1)
Immature Granulocytes: 0 %
Lymphocytes Absolute: 1.6 10*3/uL (ref 0.7–3.1)
Lymphs: 33 %
MCH: 27.9 pg (ref 26.6–33.0)
MCHC: 32 g/dL (ref 31.5–35.7)
MCV: 87 fL (ref 79–97)
Monocytes Absolute: 0.4 10*3/uL (ref 0.1–0.9)
Monocytes: 9 %
Neutrophils Absolute: 2.5 10*3/uL (ref 1.4–7.0)
Neutrophils: 51 %
Platelets: 276 10*3/uL (ref 150–450)
RBC: 4.77 x10E6/uL (ref 3.77–5.28)
RDW: 13.3 % (ref 11.7–15.4)
WBC: 4.8 10*3/uL (ref 3.4–10.8)

## 2020-08-24 LAB — BASIC METABOLIC PANEL
BUN/Creatinine Ratio: 27 (ref 12–28)
BUN: 19 mg/dL (ref 8–27)
CO2: 24 mmol/L (ref 20–29)
Calcium: 10.2 mg/dL (ref 8.7–10.3)
Chloride: 104 mmol/L (ref 96–106)
Creatinine, Ser: 0.71 mg/dL (ref 0.57–1.00)
Glucose: 110 mg/dL — ABNORMAL HIGH (ref 65–99)
Potassium: 4.5 mmol/L (ref 3.5–5.2)
Sodium: 141 mmol/L (ref 134–144)
eGFR: 93 mL/min/{1.73_m2} (ref >59)

## 2020-08-24 LAB — MAGNESIUM: Magnesium: 1.9 mg/dL (ref 1.6–2.3)

## 2020-08-24 LAB — HEMOGLOBIN A1C
Est. average glucose Bld gHb Est-mCnc: 123 mg/dL
Hgb A1c MFr Bld: 5.9 % — ABNORMAL HIGH (ref 4.8–5.6)

## 2020-09-03 NOTE — Progress Notes (Signed)
Pt name and DOB verified. Patient aware of results and result note per Angela McClung, PA-C 

## 2020-09-19 ENCOUNTER — Other Ambulatory Visit: Payer: Self-pay | Admitting: Physician Assistant

## 2020-09-19 DIAGNOSIS — R198 Other specified symptoms and signs involving the digestive system and abdomen: Secondary | ICD-10-CM

## 2020-09-19 NOTE — Telephone Encounter (Signed)
  Notes to clinic:  requesting a 90 day supply   Requested Prescriptions  Pending Prescriptions Disp Refills   pantoprazole (PROTONIX) 40 MG tablet [Pharmacy Med Name: PANTOPRAZOLE 40MG  TABLETS] 90 tablet 0    Sig: TAKE 1 TABLET(40 MG) BY MOUTH DAILY      Gastroenterology: Proton Pump Inhibitors Passed - 09/19/2020  3:37 AM      Passed - Valid encounter within last 12 months    Recent Outpatient Visits           3 weeks ago Perforated abdominal viscus   [facility] [place], [place], [place]   2 years ago Paronychia of finger of right hand   Primary Care at [facility], [facility], [place]   3 years ago Need for vaccination   Primary Care at [facility], [facility], [place]   4 years ago Encounter for health maintenance examination   Primary Care at [facility], [facility], [place]   4 years ago Paronychia of finger of left hand   Primary Care at [place], Sharlene Motts, [place]       Future Appointments             In 2 weeks Manus Rudd, NP [facility]

## 2020-10-03 ENCOUNTER — Encounter: Payer: Self-pay | Admitting: Nurse Practitioner

## 2020-10-03 ENCOUNTER — Other Ambulatory Visit: Payer: Self-pay | Admitting: Nurse Practitioner

## 2020-10-03 ENCOUNTER — Other Ambulatory Visit: Payer: Self-pay

## 2020-10-03 ENCOUNTER — Ambulatory Visit: Payer: Medicare Other | Attending: Nurse Practitioner | Admitting: Nurse Practitioner

## 2020-10-03 VITALS — BP 164/95 | HR 64 | Ht 64.0 in | Wt 129.8 lb

## 2020-10-03 DIAGNOSIS — Z13820 Encounter for screening for osteoporosis: Secondary | ICD-10-CM

## 2020-10-03 DIAGNOSIS — R030 Elevated blood-pressure reading, without diagnosis of hypertension: Secondary | ICD-10-CM

## 2020-10-03 DIAGNOSIS — Z1231 Encounter for screening mammogram for malignant neoplasm of breast: Secondary | ICD-10-CM

## 2020-10-03 DIAGNOSIS — Z7689 Persons encountering health services in other specified circumstances: Secondary | ICD-10-CM | POA: Diagnosis not present

## 2020-10-03 DIAGNOSIS — Z780 Asymptomatic menopausal state: Secondary | ICD-10-CM

## 2020-10-03 DIAGNOSIS — L309 Dermatitis, unspecified: Secondary | ICD-10-CM | POA: Diagnosis not present

## 2020-10-03 DIAGNOSIS — Z0100 Encounter for examination of eyes and vision without abnormal findings: Secondary | ICD-10-CM

## 2020-10-03 MED ORDER — AMLODIPINE BESYLATE 5 MG PO TABS: 5.0000 mg | ORAL_TABLET | Freq: Every day | ORAL | 1 refills | Status: DC

## 2020-10-03 MED ORDER — TRIAMCINOLONE ACETONIDE 0.025 % EX OINT: 1.0000 "application " | TOPICAL_OINTMENT | Freq: Two times a day (BID) | CUTANEOUS | 1 refills | Status: AC

## 2020-10-03 NOTE — Progress Notes (Signed)
Assessment & Plan:  Alyssa Peterson was seen today for establish care.  Diagnoses and all orders for this visit:  Encounter to establish care  Breast cancer screening by mammogram -     MM DIGITAL SCREENING BILATERAL; Future  Postmenopausal estrogen deficiency -     DG Bone Density; Future  Screening for osteoporosis -     DG Bone Density; Future  Encounter for complete eye exam -     Ambulatory referral to Ophthalmology  Eczema, unspecified type -     triamcinolone (KENALOG) 0.025 % ointment; Apply 1 application topically 2 (two) times daily.  Elevated blood pressure reading -     amLODipine (NORVASC) 5 MG tablet; Take 1 tablet (5 mg total) by mouth daily. Continue all antihypertensives as prescribed.  Remember to bring in your blood pressure log with you for your follow up appointment.  DASH/Mediterranean Diets are healthier choices for HTN.    Patient has been counseled on age-appropriate routine health concerns for screening and prevention. These are reviewed and up-to-date. Referrals have been placed accordingly. Immunizations are up-to-date or declined.    Subjective:   Chief Complaint  Patient presents with   Establish Care   [name] 70 y.o. female presents to office today to establish care. PMH non contributory aside from gastric perforation s/p exp lap with gastrostomy tube placement 06-2020   Essential Hypertension She is doing well today. Blood pressure is poorly controlled. Will start amlodipine 5 mg daily. She would like to check her blood pressure at home for the next few weeks she will not pick up amlodipine at this time . She has been instructed to pick up her medications if blood pressure consistently >140/90. Denies chest pain, shortness of breath, palpitations, lightheadedness, dizziness, headaches or BLE edema.   BP Readings from Last 3 Encounters:  10/03/20 (!) 164/95  08/23/20 134/89  07/10/20 (!) 154/95     Review of Systems   Constitutional:  Negative for fever, malaise/fatigue and weight loss.  HENT: Negative.  Negative for nosebleeds.   Eyes: Negative.  Negative for blurred vision, double vision and photophobia.  Respiratory: Negative.  Negative for cough and shortness of breath.   Cardiovascular: Negative.  Negative for chest pain, palpitations and leg swelling.  Gastrointestinal: Negative.  Negative for heartburn, nausea and vomiting.  Musculoskeletal: Negative.  Negative for myalgias.  Skin:  Positive for itching and rash.  Neurological: Negative.  Negative for dizziness, focal weakness, seizures and headaches.  Psychiatric/Behavioral: Negative.  Negative for suicidal ideas.    Past Medical History:  Diagnosis Date   Anemia    GERD (gastroesophageal reflux disease)    Inguinal hernia    left    Past Surgical History:  Procedure Laterality Date   CYST REMOVAL NECK     DENTAL SURGERY     FEMORAL HERNIA REPAIR Left 07/17/2016   Procedure: LEFT FEMORAL  HERNIA REPAIR WITH MESH;  Surgeon: Manus Rudd, MD;  Location: [facility];  Service: General;  Laterality: Left;   GASTROJEJUNOSTOMY N/A 07/04/2020   Procedure: GASTROSTOMY TUBE;  Surgeon: Berna Bue, MD;  Location: [facility] OR;  Service: General;  Laterality: N/A;   LAPAROTOMY N/A 07/04/2020   Procedure: EXPLORATORY LAPAROTOMY;  Surgeon: Berna Bue, MD;  Location: [facility] OR;  Service: General;  Laterality: N/A;    History reviewed. No pertinent family history.  Social History Reviewed with no changes to be made today.   Outpatient Medications Prior to Visit  Medication Sig  Dispense Refill   acetaminophen (TYLENOL) 325 MG tablet Take 2 tablets (650 mg total) by mouth every 6 (six) hours as needed for fever or mild pain. (Patient not taking: No sig reported) 30 tablet 0   loratadine (CLARITIN) 10 MG tablet Take 10 mg by mouth daily as needed for allergies. (Patient not taking: No sig reported)     pantoprazole (PROTONIX) 40 MG  tablet TAKE 1 TABLET(40 MG) BY MOUTH DAILY (Patient not taking: Reported on 10/03/2020) 90 tablet 0   No facility-administered medications prior to visit.    Allergies  Allergen Reactions   Latex Itching    hands   Penicillins Hives and Rash       Objective:    BP (!) 164/95   Pulse 64   Ht 5\' 4"  (1.626 m)   Wt 129 lb 12.8 oz (58.9 kg)   SpO2 98%   BMI 22.28 kg/m  Wt Readings from Last 3 Encounters:  10/03/20 129 lb 12.8 oz (58.9 kg)  08/23/20 133 lb 3.2 oz (60.4 kg)  07/04/20 130 lb 1.1 oz (59 kg)    Physical Exam Vitals and nursing note reviewed.  Constitutional:      Appearance: She is well-developed.  HENT:     Head: Normocephalic and atraumatic.  Cardiovascular:     Rate and Rhythm: Normal rate and regular rhythm.     Heart sounds: Normal heart sounds. No murmur heard.   No friction rub. No gallop.  Pulmonary:     Effort: Pulmonary effort is normal. No tachypnea or respiratory distress.     Breath sounds: Normal breath sounds. No decreased breath sounds, wheezing, rhonchi or rales.  Chest:     Chest wall: No tenderness.  Abdominal:     General: Bowel sounds are normal.     Palpations: Abdomen is soft.  Musculoskeletal:        General: Normal range of motion.     Cervical back: Normal range of motion.  Skin:    General: Skin is warm and dry.     Findings: Rash present. Rash is macular.     Comments: Eczema on neck and right shoulder  Neurological:     Mental Status: She is alert and oriented to person, place, and time.     Coordination: Coordination normal.  Psychiatric:        Behavior: Behavior normal. Behavior is cooperative.        Thought Content: Thought content normal.        Judgment: Judgment normal.         Patient has been counseled extensively about nutrition and exercise as well as the importance of adherence with medications and regular follow-up. The patient was given clear instructions to go to ER or return to medical center if  symptoms don't improve, worsen or new problems develop. The patient verbalized understanding.   Follow-up: Return in about 2 weeks (around 10/17/2020) for BP CHECK WITH LUKE see me in september .   October, FNP-BC [facility] [place], [place]   10/03/2020, 2:20 PM

## 2020-10-15 NOTE — Progress Notes (Unsigned)
   S:    PCP: Bertram Denver, NP  Patient arrives ***.    Presents to the clinic for hypertension evaluation, counseling, and management. Patient was referred and last seen by Primary Care Provider on 10/03/20 to establish care. BP was 164/95. Plan was to start amlodipine 5 mg daily but patient wanted to check BP at home for a few weeks first. Instructed to pick up medication if BP consistently >140/90.   Today, *** -picked up amlo?  Compliance? Took meds this morning? When do you take your meds? Dizziness, headaches, blurred vision, swelling? Check Clinic BP? Home BP logs? If no logs, bring to next visit w/ BP cuff Go over BP goals Additional BP therapy if needed  Diet/exercise?   Medication adherence *** .  Current BP Medications include:  *** amlodipine 5 mg daily  Antihypertensives tried in the past include: None  Dietary habits include: *** Exercise habits include:*** Family / Social history: not pertinent   O:  Home BP readings: ***  Last 3 Office BP readings: BP Readings from Last 3 Encounters:  10/03/20 (!) 164/95  08/23/20 134/89  07/10/20 (!) 154/95    BMET    Component Value Date/Time   NA 141 08/23/2020 1017   K 4.5 08/23/2020 1017   CL 104 08/23/2020 1017   CO2 24 08/23/2020 1017   GLUCOSE 110 (H) 08/23/2020 1017   GLUCOSE 80 07/06/2020 0239   BUN 19 08/23/2020 1017   CREATININE 0.71 08/23/2020 1017   CALCIUM 10.2 08/23/2020 1017   GFRNONAA >60 07/06/2020 0239   GFRAA >60 04/01/2018 2122    Renal function: CrCl cannot be calculated (Patient's most recent lab result is older than the maximum 21 days allowed.).  Clinical ASCVD: No  The ASCVD Risk score Denman George DC Jr., et al., 2013) failed to calculate for the following reasons:   Cannot find a previous HDL lab   Cannot find a previous total cholesterol lab   A/P: Hypertension diagnosed *** currently *** on current medications. BP Goal = < 130/80 mmHg. Medication adherence ***.  -{Meds  adjust:18428} ***.  -F/u labs ordered - *** -Counseled on lifestyle modifications for blood pressure control including reduced dietary sodium, increased exercise, adequate sleep.  Results reviewed and written information provided.   Total time in face-to-face counseling *** minutes.   F/U Clinic Visit in ***.

## 2020-10-17 ENCOUNTER — Ambulatory Visit: Payer: Medicare Other | Admitting: Pharmacist

## 2020-10-19 ENCOUNTER — Ambulatory Visit: Payer: Medicare Other | Admitting: Pharmacist

## 2020-10-23 ENCOUNTER — Ambulatory Visit: Payer: Medicare Other | Attending: Nurse Practitioner | Admitting: Pharmacist

## 2020-10-23 ENCOUNTER — Encounter: Payer: Self-pay | Admitting: Pharmacist

## 2020-10-23 ENCOUNTER — Other Ambulatory Visit: Payer: Self-pay

## 2020-10-23 ENCOUNTER — Ambulatory Visit: Payer: Medicare Other | Admitting: Nurse Practitioner

## 2020-10-23 VITALS — BP 156/96

## 2020-10-23 DIAGNOSIS — I10 Essential (primary) hypertension: Secondary | ICD-10-CM

## 2020-10-23 NOTE — Progress Notes (Signed)
   S:    PCP: Zelda   Patient arrives in good spirits. Presents to the clinic for hypertension evaluation, counseling, and management. Patient was referred and last seen by Primary Care Provider on 10/03/2020.   Medication adherence denied. She has not started the amlodipine. She wishes to make dietary and exercise changes before starting the amlodipine.   Current BP Medications include:  amlodipine 5 mg daily (not taking)  Antihypertensives tried in the past include: none  Dietary habits include:  -Trying to limit salt; adds some when cooking but denies adding salt to her food at the table  -Gave up fries; eating more greens and vegetables  -Does admit to eating chicken nuggets   Exercise habits include: -Walks 18 minutes/3 days weekly   Family / Social history:  -Fhx:  -Tobacco: never smoker  -Alcohol: denies use   O:  Vitals:   10/23/20 1501  BP: (!) 156/96   Home BP readings: none  Last 3 Office BP readings: BP Readings from Last 3 Encounters:  10/23/20 (!) 156/96  10/03/20 (!) 164/95  08/23/20 134/89    BMET    Component Value Date/Time   NA 141 08/23/2020 1017   K 4.5 08/23/2020 1017   CL 104 08/23/2020 1017   CO2 24 08/23/2020 1017   GLUCOSE 110 (H) 08/23/2020 1017   GLUCOSE 80 07/06/2020 0239   BUN 19 08/23/2020 1017   CREATININE 0.71 08/23/2020 1017   CALCIUM 10.2 08/23/2020 1017   GFRNONAA >60 07/06/2020 0239   GFRAA >60 04/01/2018 2122   Renal function: CrCl cannot be calculated (Patient's most recent lab result is older than the maximum 21 days allowed.).  Clinical ASCVD: No  The ASCVD Risk score Denman George DC Jr., et al., 2013) failed to calculate for the following reasons:   Cannot find a previous HDL lab   Cannot find a previous total cholesterol lab  A/P: Hypertension longstanding currently uncontrolled but she is not taking amlodipine. BP Goal = < 130/80 mmHg. After discussion, she is amenable to starting amlodipine.   -Continued amlodipine.  Advised pt to start this given her BP. -Counseled on lifestyle modifications for blood pressure control including reduced dietary sodium, increased exercise, adequate sleep.  Results reviewed and written information provided.   Total time in face-to-face counseling 30 minutes.   F/U Clinic Visit in 3-4 weeks.    Butch Penny, PharmD, P[place]y [place], CPP Clinical Pharmacist [facility] & [facility] [phone]

## 2020-10-30 ENCOUNTER — Other Ambulatory Visit: Payer: Self-pay | Admitting: Nurse Practitioner

## 2020-10-30 DIAGNOSIS — R030 Elevated blood-pressure reading, without diagnosis of hypertension: Secondary | ICD-10-CM

## 2020-11-20 NOTE — Progress Notes (Signed)
S:    PCP: Zelda   Presents to the clinic for hypertension evaluation, counseling, and management. Patient was referred and last seen by Primary Care Provider on 10/03/2020. At that time, BP was 164/95 and amlodipine 5 mg daily was started. At last CPP visit on 8/2, BP was 156/96, patient was not taking amlodipine because she wishes to make dietary/exercise changes first. After discussion with CPP, patient was amenable to starting amlodipine.   Today, patient arrives in good spirits. Reports she missed 3 doses of amlodipine over the weekend (Friday, Saturday, Sunday) due to being out of town for a wedding and forgetting ot bring it with her. She also has not yet taken it today. Otherwise reports no missed doses. She had a root canal this morning and she came straight from the dentist to here. Denies headaches, dizziness, blurred vision, swelling. Reports tolerating amlodipine well with no adverse effects. She had COVID this month but it was mild and she has completely recovered. Does not have a BP cuff at home.   Current BP Medications include: amlodipine 5 mg daily (takes at 7am)  Antihypertensives tried in the past include: none  Dietary habits include:  -No salt and no sweets -Gave up fries; eating more greens and vegetables  -Does admit to eating chicken nuggets   Exercise habits include: -Walks 18 minutes/3 days weekly   Family / Social history:  -Fhx:  -Tobacco: never smoker  -Alcohol: denies use   O:  Vitals:   11/21/20 1110  BP: (!) 148/94  Pulse: 64    Home BP readings: none (no BP cuff at home)  Last 3 Office BP readings: BP Readings from Last 3 Encounters:  11/21/20 (!) 148/94  10/23/20 (!) 156/96  10/03/20 (!) 164/95    BMET    Component Value Date/Time   NA 141 08/23/2020 1017   K 4.5 08/23/2020 1017   CL 104 08/23/2020 1017   CO2 24 08/23/2020 1017   GLUCOSE 110 (H) 08/23/2020 1017   GLUCOSE 80 07/06/2020 0239   BUN 19 08/23/2020 1017   CREATININE  0.71 08/23/2020 1017   CALCIUM 10.2 08/23/2020 1017   GFRNONAA >60 07/06/2020 0239   GFRAA >60 04/01/2018 2122   Renal function: CrCl cannot be calculated (Patient's most recent lab result is older than the maximum 21 days allowed.).  Clinical ASCVD: No  The ASCVD Risk score Denman George DC Jr., et al., 2013) failed to calculate for the following reasons:   Cannot find a previous HDL lab   Cannot find a previous total cholesterol lab  A/P: Hypertension longstanding currently uncontrolled. BP Goal = < 130/80 mmHg. Suspect this is because she has not taken amlodipine on 4 of the last 5 days including today and she also just had a root canal this morning, reports stress with this procedure. Therefore, unable to know what her BP is on amlodipine, so will not make any changes today.  -Continued amlodipine 5 mg daily. Encouraged her to not miss any doses between now and visit with Zelda in a couple weeks. Also instructed her to take her medication the day of that appointment. If BP is still elevated at next visit despite adherence, would consider increasing amlodipine to 10 mg daily.  -Counseled on lifestyle modifications for blood pressure control including reduced dietary sodium, increased exercise, adequate sleep.  Results reviewed and written information provided. Total time in face-to-face counseling 20 minutes.   F/U Clinic Visit with Zelda in 2 weeks on 9/13.  Pervis Hocking, PharmD PGY2 Ambulatory Care Pharmacy Resident 11/21/2020 11:21 AM

## 2020-11-21 ENCOUNTER — Encounter: Payer: Self-pay | Admitting: Pharmacist

## 2020-11-21 ENCOUNTER — Other Ambulatory Visit: Payer: Self-pay

## 2020-11-21 ENCOUNTER — Ambulatory Visit: Payer: Medicare Other | Attending: Nurse Practitioner | Admitting: Pharmacist

## 2020-11-21 VITALS — BP 148/94 | HR 64

## 2020-11-21 DIAGNOSIS — I10 Essential (primary) hypertension: Secondary | ICD-10-CM | POA: Insufficient documentation

## 2020-11-21 DIAGNOSIS — R030 Elevated blood-pressure reading, without diagnosis of hypertension: Secondary | ICD-10-CM

## 2020-11-21 MED ORDER — AMLODIPINE BESYLATE 5 MG PO TABS: ORAL_TABLET | ORAL | 1 refills | Status: DC

## 2020-11-24 ENCOUNTER — Ambulatory Visit
Admission: RE | Admit: 2020-11-24 | Discharge: 2020-11-24 | Disposition: A | Discharge status: Home / Self Care | Payer: Medicare Other | Source: Ambulatory Visit | Attending: Nurse Practitioner | Admitting: Nurse Practitioner

## 2020-11-24 ENCOUNTER — Other Ambulatory Visit: Payer: Self-pay

## 2020-11-24 DIAGNOSIS — Z1231 Encounter for screening mammogram for malignant neoplasm of breast: Secondary | ICD-10-CM | POA: Diagnosis not present

## 2020-12-04 ENCOUNTER — Other Ambulatory Visit: Payer: Self-pay

## 2020-12-04 ENCOUNTER — Ambulatory Visit: Payer: Medicare Other | Attending: Nurse Practitioner | Admitting: Nurse Practitioner

## 2020-12-04 DIAGNOSIS — R030 Elevated blood-pressure reading, without diagnosis of hypertension: Secondary | ICD-10-CM

## 2020-12-04 MED ORDER — AMLODIPINE BESYLATE 10 MG PO TABS: 10.0000 mg | ORAL_TABLET | Freq: Every day | ORAL | 1 refills | Status: DC

## 2020-12-04 NOTE — Progress Notes (Signed)
Assessment & Plan:  Alyssa Peterson was seen today for hypertension.  Diagnoses and all orders for this visit:  Elevated blood pressure reading -     amLODipine (NORVASC) 10 MG tablet; Take 1 tablet (10 mg total) by mouth daily. Continue all antihypertensives as prescribed.  Remember to bring in your blood pressure log with you for your follow up appointment.  DASH/Mediterranean Diets are healthier choices for HTN.    Patient has been counseled on age-appropriate routine health concerns for screening and prevention. These are reviewed and up-to-date. Referrals have been placed accordingly. Immunizations are up-to-date or declined.    Subjective:   Chief Complaint  Patient presents with   Hypertension   [name] 70 y.o. female presents to office today for HTN. She has a past medical history of Anemia, GERD, Hypertension, and Inguinal hernia.     HTN Poorly controlled. Will increase amlodipine to 10 mg from 5 mg. Denies chest pain, shortness of breath, palpitations, lightheadedness, dizziness, headaches or BLE edema.   BP Readings from Last 3 Encounters:  12/04/20 (!) 145/105  11/21/20 (!) 148/94  10/23/20 (!) 156/96    Review of Systems  Constitutional:  Negative for fever, malaise/fatigue and weight loss.  HENT: Negative.  Negative for nosebleeds.   Eyes: Negative.  Negative for blurred vision, double vision and photophobia.  Respiratory: Negative.  Negative for cough and shortness of breath.   Cardiovascular: Negative.  Negative for chest pain, palpitations and leg swelling.  Gastrointestinal: Negative.  Negative for heartburn, nausea and vomiting.  Musculoskeletal: Negative.  Negative for myalgias.  Neurological: Negative.  Negative for dizziness, focal weakness, seizures and headaches.  Psychiatric/Behavioral: Negative.  Negative for suicidal ideas.    Past Medical History:  Diagnosis Date   Anemia    GERD (gastroesophageal reflux disease)    Hypertension     Inguinal hernia    left    Past Surgical History:  Procedure Laterality Date   CYST REMOVAL NECK     DENTAL SURGERY     FEMORAL HERNIA REPAIR Left 07/17/2016   Procedure: LEFT FEMORAL  HERNIA REPAIR WITH MESH;  Surgeon: Manus Rudd, MD;  Location: [facility];  Service: General;  Laterality: Left;   GASTROJEJUNOSTOMY N/A 07/04/2020   Procedure: GASTROSTOMY TUBE;  Surgeon: Berna Bue, MD;  Location: [facility] OR;  Service: General;  Laterality: N/A;   LAPAROTOMY N/A 07/04/2020   Procedure: EXPLORATORY LAPAROTOMY;  Surgeon: Berna Bue, MD;  Location: [facility] OR;  Service: General;  Laterality: N/A;    History reviewed. No pertinent family history.  Social History Reviewed with no changes to be made today.   Outpatient Medications Prior to Visit  Medication Sig Dispense Refill   amLODipine (NORVASC) 5 MG tablet TAKE 1 TABLET(5 MG) BY MOUTH DAILY 90 tablet 1   No facility-administered medications prior to visit.    Allergies  Allergen Reactions   Latex Itching    hands   Penicillins Hives and Rash       Objective:    BP (!) 145/105   Pulse 70   Ht 5' 3.75" (1.619 m)   Wt 126 lb 4 oz (57.3 kg)   SpO2 96%   BMI 21.84 kg/m  Wt Readings from Last 3 Encounters:  12/04/20 126 lb 4 oz (57.3 kg)  10/03/20 129 lb 12.8 oz (58.9 kg)  08/23/20 133 lb 3.2 oz (60.4 kg)    Physical Exam Vitals and nursing note reviewed.  Constitutional:  Appearance: She is well-developed.  HENT:     Head: Normocephalic and atraumatic.  Cardiovascular:     Rate and Rhythm: Normal rate and regular rhythm.     Heart sounds: Normal heart sounds. No murmur heard.   No friction rub. No gallop.  Pulmonary:     Effort: Pulmonary effort is normal. No tachypnea or respiratory distress.     Breath sounds: Normal breath sounds. No decreased breath sounds, wheezing, rhonchi or rales.  Chest:     Chest wall: No tenderness.  Abdominal:     General: Bowel sounds are normal.      Palpations: Abdomen is soft.  Musculoskeletal:        General: Normal range of motion.     Cervical back: Normal range of motion.  Skin:    General: Skin is warm and dry.  Neurological:     Mental Status: She is alert and oriented to person, place, and time.     Coordination: Coordination normal.  Psychiatric:        Behavior: Behavior normal. Behavior is cooperative.        Thought Content: Thought content normal.        Judgment: Judgment normal.         Patient has been counseled extensively about nutrition and exercise as well as the importance of adherence with medications and regular follow-up. The patient was given clear instructions to go to ER or return to medical center if symptoms don't improve, worsen or new problems develop. The patient verbalized understanding.   Follow-up: Return in about 2 weeks (around 12/18/2020) for BP CHECK WITH LUKE 2-3 weeks. SEE ME in 3 months. Claiborne Rigg, FNP-BC [facility] and [facility] [place], [place] [phone]

## 2020-12-08 ENCOUNTER — Encounter: Payer: Self-pay | Admitting: Nurse Practitioner

## 2020-12-26 ENCOUNTER — Encounter: Payer: Self-pay | Admitting: Pharmacist

## 2020-12-26 ENCOUNTER — Ambulatory Visit: Payer: Medicare Other | Attending: Nurse Practitioner | Admitting: Pharmacist

## 2020-12-26 ENCOUNTER — Other Ambulatory Visit: Payer: Self-pay

## 2020-12-26 VITALS — BP 133/86

## 2020-12-26 DIAGNOSIS — I10 Essential (primary) hypertension: Secondary | ICD-10-CM | POA: Diagnosis not present

## 2020-12-26 NOTE — Progress Notes (Signed)
   S:    PCP: Zelda   Presents to the clinic for hypertension evaluation, counseling, and management. Patient was referred and last seen by Primary Care Provider on 12/04/2020. Amlodipine was increased to 10 mg daily.   Today, patient arrives in good spirits. She endorses occasional dizziness and HA that originated when she first started the 10mg  dose of amlodipine. Dizziness has resolved, however, she can still feel a faint HA. Denies any sx today. Denies chest pain, dyspnea.   Current BP Medications include: amlodipine 10 mg daily (takes at 7am)  Antihypertensives tried in the past include: none  Dietary habits include:  -No salt and no sweets -Gave up fries; eating more greens and vegetables  -Does admit to eating chicken nuggets   Exercise habits include: -Walks 18 minutes/3 days weekly  -Plans to use her exercise bike more  Family / Social history:  -Fhx: no pertinent positives -Tobacco: never smoker  -Alcohol: denies use   O:  Vitals:   12/26/20 0951  BP: 133/86    Home BP readings: none (no BP cuff at home)  Last 3 Office BP readings: BP Readings from Last 3 Encounters:  12/26/20 133/86  12/04/20 (!) 145/105  11/21/20 (!) 148/94    BMET    Component Value Date/Time   NA 141 08/23/2020 1017   K 4.5 08/23/2020 1017   CL 104 08/23/2020 1017   CO2 24 08/23/2020 1017   GLUCOSE 110 (H) 08/23/2020 1017   GLUCOSE 80 07/06/2020 0239   BUN 19 08/23/2020 1017   CREATININE 0.71 08/23/2020 1017   CALCIUM 10.2 08/23/2020 1017   GFRNONAA >60 07/06/2020 0239   GFRAA >60 04/01/2018 2122   Renal function: CrCl cannot be calculated (Patient's most recent lab result is older than the maximum 21 days allowed.).  Clinical ASCVD: No  The ASCVD Risk score (Arnett DK, et al., 2019) failed to calculate for the following reasons:   Cannot find a previous HDL lab   Cannot find a previous total cholesterol lab  A/P: Hypertension longstanding currently above goal but  improved. BP Goal = < 130/80 mmHg. Medication adherence reported. Will continue current regimen for now.  -Continued amlodipine 10 mg daily.  -Counseled on lifestyle modifications for blood pressure control including reduced dietary sodium, increased exercise, adequate sleep.  Results reviewed and written information provided. Total time in face-to-face counseling 20 minutes.   F/U Clinic Visit with me in 1 month.   2020, PharmD, Butch Penny, CPP Clinical Pharmacist [facility] & [facility] [phone]

## 2021-01-28 ENCOUNTER — Encounter: Payer: Self-pay | Admitting: Pharmacist

## 2021-01-28 ENCOUNTER — Ambulatory Visit: Payer: Medicare Other | Attending: Nurse Practitioner | Admitting: Pharmacist

## 2021-01-28 ENCOUNTER — Other Ambulatory Visit: Payer: Self-pay

## 2021-01-28 VITALS — BP 142/95

## 2021-01-28 DIAGNOSIS — I10 Essential (primary) hypertension: Secondary | ICD-10-CM

## 2021-01-28 MED ORDER — HYDROCHLOROTHIAZIDE 12.5 MG PO CAPS: 12.5000 mg | ORAL_CAPSULE | Freq: Every day | ORAL | 0 refills | Status: DC

## 2021-01-28 NOTE — Progress Notes (Signed)
   S:    PCP: Bertram Denver, NP  Patient arrives to the clinic 01/28/21 for a follow-up hypertension evaluation and management. Patient was last seen on 12/26/20 by Dr. Georgiana Shore Ausdall. She currently takes Amlodipine 10mg  daily and reports missing only 3 doses within 30 days. Denies chest pain, lightheadedness, bilateral extremity edema, headaches. She endorses occasional dizziness that hasn't worsened since her last visit.  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  Home BP readings: reports it averages upper 130s and 90s, does not keep a log.  Last 3 Office BP readings: BP Readings from Last 3 Encounters:  01/28/21 (!) 142/95  12/26/20 133/86  12/04/20 (!) 145/105    BMET    Component Value Date/Time   NA 141 08/23/2020 1017   K 4.5 08/23/2020 1017   CL 104 08/23/2020 1017   CO2 24 08/23/2020 1017   GLUCOSE 110 (H) 08/23/2020 1017   GLUCOSE 80 07/06/2020 0239   BUN 19 08/23/2020 1017   CREATININE 0.71 08/23/2020 1017   CALCIUM 10.2 08/23/2020 1017   GFRNONAA >60 07/06/2020 0239   GFRAA >60 04/01/2018 2122    Renal function: CrCl cannot be calculated (Patient's most recent lab result is older than the maximum 21 days allowed.).  Clinical ASCVD:  The ASCVD Risk score (Arnett DK, et al., 2019) failed to calculate for the following reasons:   Cannot find a previous HDL lab   Cannot find a previous total cholesterol lab   A/P: Hypertension longstanding currently above goal on current medication. BP Goal = <  130/80 mmHg. Medication adherence as per HPI.  -Continued Amlodipine 10mg  daily -Started HCTZ 12.5mg  daily  -Recommended keeping BP log -Counseled on lifestyle modifications for blood pressure control including reduced dietary sodium, increased exercise, adequate sleep and medication AE. -Will plan on f/u labs at next visit.   Results reviewed and written information provided. Total time in face-to-face counseling 20 minutes.   F/U Clinic Visit in 2-3 weeks.  2123, PharmD Candidate Student Pharmacist [facility] 2020 SOP  , PharmD, [place], CPP Clinical Pharmacist [facility] & [facility] [phone]

## 2021-02-18 NOTE — Progress Notes (Signed)
   S:    Patient arrives in good spirits. Presents to the clinic for hypertension evaluation, counseling, and management. Patient was referred and last seen by Primary Care Provider, Bertram Denver, on 12/04/2020. Patient was last seen by pharmacy on 01/28/2021 where HCTZ was started for persistently elevated blood pressure.  Medication adherence missed 2 doses of amlodipine in the last 2 weeks. Patient was unable to pick up new prescription for HCTZ since last visit due to travel around the holiday.  Current BP Medications include:   -HCTZ 12.5 mg QD (not started) -Amlodipine 10 mg QD  Antihypertensives tried in the past include: none  Dietary habits include: salads and chicken mostly. Patient reports [employer] and using salt with cooking Exercise habits include:walking about 30 minutes per day Family / Social history: never smoker  ASCVD risk factors include: HTN, age   O:  Vitals:   02/19/21 0907  BP: (!) 153/98  Pulse: 71   Home BP readings: not checked in the last two weeks  Patient was educated on how to use BP machine at home and agrees to start home blood pressure monitoring.  Last 3 Office BP readings: BP Readings from Last 3 Encounters:  02/19/21 (!) 153/98  01/28/21 (!) 142/95  12/26/20 133/86   BMET    Component Value Date/Time   NA 141 08/23/2020 1017   K 4.5 08/23/2020 1017   CL 104 08/23/2020 1017   CO2 24 08/23/2020 1017   GLUCOSE 110 (H) 08/23/2020 1017   GLUCOSE 80 07/06/2020 0239   BUN 19 08/23/2020 1017   CREATININE 0.71 08/23/2020 1017   CALCIUM 10.2 08/23/2020 1017   GFRNONAA >60 07/06/2020 0239   GFRAA >60 04/01/2018 2122    Renal function: CrCl cannot be calculated (Patient's most recent lab result is older than the maximum 21 days allowed.).  Clinical ASCVD: No  The ASCVD Risk score (Arnett DK, et al., 2019) failed to calculate for the following reasons:   Cannot find a previous HDL lab   Cannot find a previous total cholesterol  lab   A/P: Hypertension is currently uncontrolled on current medications. BP Goal = < 130/80 mmHg. Medication adherence is suboptimal as HCTZ was not started since the last visit.  -Continued amlodipine 10 mg daily -Start HCTZ 12.5 mg daily  -F/u labs ordered - CMP +/- lipid panel at next visit -Counseled on lifestyle modifications for blood pressure control including reduced dietary sodium, increased exercise, adequate sleep.  Results reviewed and written information provided. Total time in face-to-face counseling 30 minutes.   F/U Clinic Visit with PCP Bertram Denver in 3 weeks for medication titration and lab monitoring.  Thank you for allowing pharmacy to participate in this patient's care.  Enos Fling, PharmD PGY1 Pharmacy Resident 02/19/2021 11:59 AM

## 2021-02-19 ENCOUNTER — Ambulatory Visit: Payer: Medicare Other | Attending: Nurse Practitioner | Admitting: Pharmacist

## 2021-02-19 ENCOUNTER — Other Ambulatory Visit: Payer: Self-pay

## 2021-02-19 VITALS — BP 153/98 | HR 71

## 2021-02-19 DIAGNOSIS — I10 Essential (primary) hypertension: Secondary | ICD-10-CM

## 2021-03-08 ENCOUNTER — Ambulatory Visit: Payer: Medicare Other | Admitting: Nurse Practitioner

## 2021-03-12 ENCOUNTER — Ambulatory Visit: Payer: Medicare Other | Attending: Nurse Practitioner | Admitting: Pharmacist

## 2021-03-12 ENCOUNTER — Encounter: Payer: Self-pay | Admitting: Pharmacist

## 2021-03-12 ENCOUNTER — Other Ambulatory Visit: Payer: Self-pay

## 2021-03-12 VITALS — BP 122/84

## 2021-03-12 DIAGNOSIS — I10 Essential (primary) hypertension: Secondary | ICD-10-CM | POA: Diagnosis not present

## 2021-03-12 NOTE — Progress Notes (Signed)
° °  S:    Patient arrives in good spirits. Presents to the clinic for hypertension evaluation, counseling, and management. Patient was referred and last seen by Primary Care Provider, Bertram Denver, on 12/04/2020. Patient was last seen by pharmacy on 01/28/2021 where HCTZ was started for persistently elevated blood pressure.  Medication adherence reported.  Current BP Medications include:   -HCTZ 12.5 mg daily -Amlodipine 10 mg daily  Antihypertensives tried in the past include: none  Dietary habits include: salads and chicken mostly. Patient reports [employer] and using salt with cooking Exercise habits include: walking about 30 minutes per day Family / Social history: never smoker  ASCVD risk factors include: HTN, age   O:  Vitals:   03/12/21 0921  BP: 122/84   Home BP readings: not checked in the last two weeks  Patient was educated on how to use BP machine at home and agrees to start home blood pressure monitoring.  Last 3 Office BP readings: BP Readings from Last 3 Encounters:  03/12/21 122/84  02/19/21 (!) 153/98  01/28/21 (!) 142/95   BMET    Component Value Date/Time   NA 141 08/23/2020 1017   K 4.5 08/23/2020 1017   CL 104 08/23/2020 1017   CO2 24 08/23/2020 1017   GLUCOSE 110 (H) 08/23/2020 1017   GLUCOSE 80 07/06/2020 0239   BUN 19 08/23/2020 1017   CREATININE 0.71 08/23/2020 1017   CALCIUM 10.2 08/23/2020 1017   GFRNONAA >60 07/06/2020 0239   GFRAA >60 04/01/2018 2122    Renal function: CrCl cannot be calculated (Patient's most recent lab result is older than the maximum 21 days allowed.).  Clinical ASCVD: No  The ASCVD Risk score (Arnett DK, et al., 2019) failed to calculate for the following reasons:   Cannot find a previous HDL lab   Cannot find a previous total cholesterol lab   A/P: Hypertension is currently at goal on current medications. BP Goal = < 130/80 mmHg. Medication adherence reported.  -Continued amlodipine 10 mg daily -Continue  HCTZ 12.5 mg daily  -F/u labs: BMP at follow-up -Counseled on lifestyle modifications for blood pressure control including reduced dietary sodium, increased exercise, adequate sleep.  Results reviewed and written information provided. Total time in face-to-face counseling 30 minutes.    Thank you for allowing pharmacy to participate in this patient's care.  Butch Penny, PharmD, P[place]y [place], CPP Clinical Pharmacist [facility] & [facility] [phone]

## 2021-03-24 HISTORY — PX: LASIK: SHX215

## 2021-04-11 ENCOUNTER — Ambulatory Visit: Payer: Medicare Other

## 2021-04-11 ENCOUNTER — Other Ambulatory Visit: Payer: Self-pay | Admitting: Nurse Practitioner

## 2021-04-11 ENCOUNTER — Other Ambulatory Visit: Payer: Medicare Other

## 2021-04-11 DIAGNOSIS — Z780 Asymptomatic menopausal state: Secondary | ICD-10-CM

## 2021-04-11 DIAGNOSIS — Z13820 Encounter for screening for osteoporosis: Secondary | ICD-10-CM

## 2021-04-12 ENCOUNTER — Ambulatory Visit
Admission: RE | Admit: 2021-04-12 | Discharge: 2021-04-12 | Disposition: A | Discharge status: Home / Self Care | Payer: Medicare Other | Source: Ambulatory Visit | Attending: Nurse Practitioner | Admitting: Nurse Practitioner

## 2021-04-12 ENCOUNTER — Other Ambulatory Visit: Payer: Self-pay

## 2021-04-12 DIAGNOSIS — Z780 Asymptomatic menopausal state: Secondary | ICD-10-CM

## 2021-04-12 DIAGNOSIS — Z13820 Encounter for screening for osteoporosis: Secondary | ICD-10-CM

## 2021-04-17 ENCOUNTER — Ambulatory Visit: Payer: Medicare Other | Attending: Family Medicine | Admitting: Pharmacist

## 2021-04-17 ENCOUNTER — Other Ambulatory Visit: Payer: Self-pay

## 2021-04-18 ENCOUNTER — Telehealth: Payer: Self-pay

## 2021-04-18 NOTE — Telephone Encounter (Signed)
Called pt mobile and home number no answer left messages to call the office.

## 2021-04-18 NOTE — Telephone Encounter (Signed)
-----   Message from Gildardo Pounds, NP sent at 04/16/2021  9:16 PM EST ----- Normal bone density test.

## 2021-05-15 ENCOUNTER — Other Ambulatory Visit: Payer: Self-pay | Admitting: Family Medicine

## 2021-05-15 NOTE — Telephone Encounter (Signed)
Requested Prescriptions  Pending Prescriptions Disp Refills   hydrochlorothiazide (MICROZIDE) 12.5 MG capsule [Pharmacy Med Name: HYDROCHLOROTHIAZIDE 12.5MG  CAPSULES] 90 capsule 0    Sig: TAKE 1 CAPSULE(12.5 MG) BY MOUTH DAILY     Cardiovascular: Diuretics - Thiazide Failed - 05/15/2021  6:22 AM      Failed - Cr in normal range and within 180 days    Creatinine, Ser  Date Value Ref Range Status  08/23/2020 0.71 0.57 - 1.00 mg/dL Final         Failed - K in normal range and within 180 days    Potassium  Date Value Ref Range Status  08/23/2020 4.5 3.5 - 5.2 mmol/L Final         Failed - Na in normal range and within 180 days    Sodium  Date Value Ref Range Status  08/23/2020 141 134 - 144 mmol/L Final         Passed - Last BP in normal range    BP Readings from Last 1 Encounters:  03/12/21 122/84         Passed - Valid encounter within last 6 months    Recent Outpatient Visits          2 months ago Essential hypertension   [facility] Lois Huxley, Cornelius Moras, RPH-CPP   2 months ago Essential hypertension   [facility] Drucilla Chalet, RPH-CPP   3 months ago Essential hypertension   [facility] Lois Huxley, Cornelius Moras, RPH-CPP   4 months ago Essential hypertension   [facility] [place], Cornelius Moras, RPH-CPP   5 months ago Elevated blood pressure reading   [facility] [place], Shea Stakes, NP

## 2021-05-16 ENCOUNTER — Other Ambulatory Visit: Payer: Self-pay | Admitting: Nurse Practitioner

## 2021-05-16 DIAGNOSIS — R030 Elevated blood-pressure reading, without diagnosis of hypertension: Secondary | ICD-10-CM

## 2021-05-16 NOTE — Telephone Encounter (Signed)
Requested medication (s) are due for refill today: expired medication  Requested medication (s) are on the active medication list: yes  Last refill:  12/04/20-03/04/21 #90 1 refills  Future visit scheduled: no  Notes to clinic:  expired medication do you want to renew Rx?     Requested Prescriptions  Pending Prescriptions Disp Refills   amLODipine (NORVASC) 10 MG tablet [Pharmacy Med Name: AMLODIPINE BESYLATE 10MG  TABLETS] 90 tablet 1    Sig: TAKE 1 TABLET(10 MG) BY MOUTH DAILY     Cardiovascular: Calcium Channel Blockers 2 Passed - 05/16/2021  4:50 AM      Passed - Last BP in normal range    BP Readings from Last 1 Encounters:  03/12/21 122/84          Passed - Last Heart Rate in normal range    Pulse Readings from Last 1 Encounters:  02/19/21 71          Passed - Valid encounter within last 6 months    Recent Outpatient Visits           2 months ago Essential hypertension   [facility], Lois Huxley, RPH-CPP   2 months ago Essential hypertension   [facility], Lois Huxley, RPH-CPP   3 months ago Essential hypertension   [facility], Lois Huxley, RPH-CPP   4 months ago Essential hypertension   [facility] [place], KOOMBERKINE, RPH-CPP   5 months ago Elevated blood pressure reading   [facility] [place], [place], NP

## 2021-08-16 ENCOUNTER — Other Ambulatory Visit: Payer: Self-pay

## 2021-08-16 ENCOUNTER — Emergency Department (HOSPITAL_COMMUNITY)
Admission: EM | Admit: 2021-08-16 | Discharge: 2021-08-16 | Disposition: A | Discharge status: Home / Self Care | Payer: Medicare Other | Attending: Emergency Medicine | Admitting: Emergency Medicine

## 2021-08-16 ENCOUNTER — Emergency Department (HOSPITAL_COMMUNITY): Payer: Medicare Other

## 2021-08-16 ENCOUNTER — Encounter (HOSPITAL_COMMUNITY): Payer: Self-pay | Admitting: Emergency Medicine

## 2021-08-16 DIAGNOSIS — K449 Diaphragmatic hernia without obstruction or gangrene: Secondary | ICD-10-CM | POA: Insufficient documentation

## 2021-08-16 DIAGNOSIS — R109 Unspecified abdominal pain: Secondary | ICD-10-CM | POA: Diagnosis present

## 2021-08-16 LAB — CBC
HCT: 39.3 % (ref 36.0–46.0)
Hemoglobin: 12.8 g/dL (ref 12.0–15.0)
MCH: 29.1 pg (ref 26.0–34.0)
MCHC: 32.6 g/dL (ref 30.0–36.0)
MCV: 89.3 fL (ref 80.0–100.0)
Platelets: 298 10*3/uL (ref 150–400)
RBC: 4.4 MIL/uL (ref 3.87–5.11)
RDW: 11.9 % (ref 11.5–15.5)
WBC: 7.1 10*3/uL (ref 4.0–10.5)
nRBC: 0 % (ref 0.0–0.2)

## 2021-08-16 LAB — COMPREHENSIVE METABOLIC PANEL
ALT: 18 U/L (ref 0–44)
AST: 18 U/L (ref 15–41)
Albumin: 3.3 g/dL — ABNORMAL LOW (ref 3.5–5.0)
Alkaline Phosphatase: 108 U/L (ref 38–126)
Anion gap: 8 (ref 5–15)
BUN: 14 mg/dL (ref 8–23)
CO2: 24 mmol/L (ref 22–32)
Calcium: 9.1 mg/dL (ref 8.9–10.3)
Chloride: 104 mmol/L (ref 98–111)
Creatinine, Ser: 0.73 mg/dL (ref 0.44–1.00)
GFR, Estimated: >60 mL/min (ref >60)
Glucose, Bld: 113 mg/dL — ABNORMAL HIGH (ref 70–99)
Potassium: 3.3 mmol/L — ABNORMAL LOW (ref 3.5–5.1)
Sodium: 136 mmol/L (ref 135–145)
Total Bilirubin: 0.2 mg/dL — ABNORMAL LOW (ref 0.3–1.2)
Total Protein: 7.2 g/dL (ref 6.5–8.1)

## 2021-08-16 LAB — URINALYSIS, ROUTINE W REFLEX MICROSCOPIC
Bilirubin Urine: NEGATIVE
Glucose, UA: NEGATIVE mg/dL
Hgb urine dipstick: NEGATIVE
Ketones, ur: 20 mg/dL — AB
Leukocytes,Ua: NEGATIVE
Nitrite: NEGATIVE
Protein, ur: NEGATIVE mg/dL
Specific Gravity, Urine: 1.025 (ref 1.005–1.030)
pH: 5 (ref 5.0–8.0)

## 2021-08-16 LAB — LIPASE, BLOOD: Lipase: 20 U/L (ref 11–51)

## 2021-08-16 MED ORDER — SODIUM CHLORIDE 0.9 % IV BOLUS
1000.0000 mL | Freq: Once | INTRAVENOUS | Status: AC
  Administered 2021-08-16: 1000 mL via INTRAVENOUS

## 2021-08-16 MED ORDER — IOHEXOL 300 MG/ML  SOLN
80.0000 mL | Freq: Once | INTRAMUSCULAR | Status: AC | PRN
  Administered 2021-08-16: 80 mL via INTRAVENOUS

## 2021-08-16 MED ORDER — POTASSIUM CHLORIDE CRYS ER 20 MEQ PO TBCR
40.0000 meq | EXTENDED_RELEASE_TABLET | Freq: Once | ORAL | Status: AC
  Administered 2021-08-16: 40 meq via ORAL
  Filled 2021-08-16: qty 2

## 2021-08-16 MED ORDER — PANTOPRAZOLE SODIUM 40 MG PO TBEC: 40.0000 mg | DELAYED_RELEASE_TABLET | Freq: Every day | ORAL | 0 refills | Status: DC

## 2021-08-16 NOTE — Discharge Instructions (Addendum)
If you develop worsening, continued, or recurrent abdominal pain, uncontrolled vomiting, fever, chest or back pain,black or bloody stools, or any other new/concerning symptoms then return to the ER for evaluation.

## 2021-08-16 NOTE — ED Triage Notes (Signed)
Pt here with abdominal pain for 2 weeks, patient initially thought this was just gas. Patient wasn't further eval for the pain is becoming more aggravating to the upper abdomen. Pt with hx of hernias with surgery. Denies chest pain, SHOB, n/v/d. Poor appetite. AOX4

## 2021-08-16 NOTE — ED Notes (Signed)
Pt with CT tech, will start medications when returned to room.

## 2021-08-16 NOTE — ED Provider Triage Note (Signed)
Emergency Medicine Provider Triage Evaluation Note  Alyssa Peterson , a 71 y.o. female  was evaluated in triage.  Pt complains of occasional on and off abdominal pain.  She reports that she had a hernia repair surgery around a year ago and felt some pain around her previous incision sites.  She denies current pain, nausea, vomiting, diarrhea.  Review of Systems  Positive: Abdominal pain Negative: Nausea, vomiting, diarrhea, fever, chills  Physical Exam  BP (!) 159/101 (BP Location: Right Arm)   Pulse 74   Temp 98.1 F (36.7 C) (Oral)   Resp 18   Ht 5\' 3"  (1.6 m)   Wt 57 kg   SpO2 99%   BMI 22.26 kg/m  Gen:   Awake, no distress   Resp:  Normal effort  MSK:   Moves extremities without difficulty  Other:  No ttp abdomen  Medical Decision Making  Medically screening exam initiated at 5:24 PM.  Appropriate orders placed.  Minsa Weddington Ditto was informed that the remainder of the evaluation will be completed by another provider, this initial triage assessment does not replace that evaluation, and the importance of remaining in the ED until their evaluation is complete.  Workup initiated   Fuller Canada, Olene Floss 08/16/21 1725

## 2021-08-16 NOTE — ED Provider Notes (Signed)
[facility] EMERGENCY DEPARTMENT Provider Note   CSN: [id number] Arrival date & time: 08/16/21  1620     History  Chief Complaint  Patient presents with   Abdominal Pain    Alyssa Peterson is a 71 y.o. female.  HPI 71 year old female presents with abdominal pain.  She has been having pain that moves around her abdomen for the last 2 weeks or so.  It has never gone away until she got into the waiting room and now she has no pain.  It feels like a nagging sensation.  She feels full quickly and sometimes just does not feel like she has an appetite.  However she has not had vomiting, diarrhea or constipation.  Her stools are little more here than normal but not diarrhea.  No fevers, chest pain, shortness of breath.  No urinary symptoms.  This does not feel similar to when she had to have emergency surgery a little over a year ago.  Home Medications Prior to Admission medications   Medication Sig Start Date End Date Taking? Authorizing Provider  pantoprazole (PROTONIX) 40 MG tablet Take 1 tablet (40 mg total) by mouth daily. 08/16/21  Yes Pricilla Loveless, MD  amLODipine (NORVASC) 10 MG tablet TAKE 1 TABLET(10 MG) BY MOUTH DAILY 05/16/21   Hoy Register, MD  hydrochlorothiazide (MICROZIDE) 12.5 MG capsule TAKE 1 CAPSULE(12.5 MG) BY MOUTH DAILY 05/15/21   Claiborne Rigg, NP      Allergies    Latex and Penicillins    Review of Systems   Review of Systems  Constitutional:  Negative for fever.  Respiratory:  Negative for shortness of breath.   Cardiovascular:  Negative for chest pain.  Gastrointestinal:  Positive for abdominal pain. Negative for constipation, diarrhea and vomiting.  Genitourinary:  Negative for dysuria and hematuria.  Musculoskeletal:  Negative for back pain.   Physical Exam Updated Vital Signs BP (!) 144/93   Pulse 71   Temp 98.1 F (36.7 C) (Oral)   Resp 19   Ht 5\' 3"  (1.6 m)   Wt 57 kg   SpO2 100%   BMI 22.26 kg/m  Physical  Exam Vitals and nursing note reviewed.  Constitutional:      General: She is not in acute distress.    Appearance: She is well-developed. She is not ill-appearing or diaphoretic.  HENT:     Head: Normocephalic and atraumatic.  Cardiovascular:     Rate and Rhythm: Normal rate and regular rhythm.     Heart sounds: Normal heart sounds.  Pulmonary:     Effort: Pulmonary effort is normal.  Abdominal:     Palpations: Abdomen is soft.     Tenderness: There is no abdominal tenderness.  Skin:    General: Skin is warm and dry.  Neurological:     Mental Status: She is alert.    ED Results / Procedures / Treatments   Labs (all labs ordered are listed, but only abnormal results are displayed) Labs Reviewed  COMPREHENSIVE METABOLIC PANEL - Abnormal; Notable for the following components:      Result Value   Potassium 3.3 (*)    Glucose, Bld 113 (*)    Albumin 3.3 (*)    Total Bilirubin 0.2 (*)    All other components within normal limits  URINALYSIS, ROUTINE W REFLEX MICROSCOPIC - Abnormal; Notable for the following components:   APPearance HAZY (*)    Ketones, ur 20 (*)    All other components within normal limits  LIPASE, BLOOD  CBC    EKG EKG Interpretation  Date/Time:  Friday Aug 16 2021 16:51:30 EDT Ventricular Rate:  81 PR Interval:  146 QRS Duration: 78 QT Interval:  370 QTC Calculation: 429 R Axis:   -21 Text Interpretation: Sinus rhythm with Premature atrial complexes Anterior infarct , age undetermined Abnormal ECG No previous ECGs available Confirmed by Pricilla LovelessGoldston, Zahrah Sutherlin [phone]([id number]) on 08/16/2021 6:39:30 PM  Radiology CT ABDOMEN PELVIS W CONTRAST  Result Date: 08/16/2021 CLINICAL DATA:  Acute abdominal pain. EXAM: CT ABDOMEN AND PELVIS WITH CONTRAST TECHNIQUE: Multidetector CT imaging of the abdomen and pelvis was performed using the standard protocol following bolus administration of intravenous contrast. RADIATION DOSE REDUCTION: This exam was performed according to the  departmental dose-optimization program which includes automated exposure control, adjustment of the mA and/or kV according to patient size and/or use of iterative reconstruction technique. CONTRAST:  80mL OMNIPAQUE IOHEXOL 300 MG/ML  SOLN COMPARISON:  CT abdomen and pelvis 07/04/2020 FINDINGS: Lower chest: No acute abnormality. Hepatobiliary: There is a rounded hypodensity in the liver measuring 1.3 cm most compatible with cyst. Gallbladder and bile ducts are within normal limits. Pancreas: Unremarkable. No pancreatic ductal dilatation or surrounding inflammatory changes. Spleen: Normal in size without focal abnormality. Adrenals/Urinary Tract: Adrenal glands are unremarkable. Kidneys are normal, without renal calculi, focal lesion, or hydronephrosis. Bladder is unremarkable. Stomach/Bowel: Again seen is large hiatal hernia. 50% of the stomach is seen within the hernia. The intrathoracic portion of the stomach appears dilated in there is surrounding fluid and marked edematous wall. Intra-abdominal portion of the stomach appears decompressed. There is a new air-fluid/debris level adjacent to the stomach at the level of the diaphragm which appears thick-walled with surrounding inflammatory stranding and fluid measuring 3.6 x 2.3 cm image 3/9. This may be connected the gastric lumen best seen on coronal image 6/19. Previously identified free air in the upper abdomen is no longer definitely seen. Remaining small bowel, colon and appendix are nondilated. There is scattered colonic diverticulosis. Vascular/Lymphatic: No significant vascular findings are present. No enlarged abdominal or pelvic lymph nodes. Reproductive: Heterogeneous lobulated uterus is again seen likely related to fibroid change. Adnexa are unremarkable. Other: Small fat containing umbilical hernia.  No ascites. Musculoskeletal: No acute or significant osseous findings. IMPRESSION: 1. Again seen is large hiatal hernia containing 50% of the proximal  stomach. This portion of the stomach is dilated with marked wall edema. Findings may represent gastric obstruction and/or severe gastritis. The intraabdominal portion of the stomach is decompressed. Surgical/GI consultation recommended. 2. 2.3 x 3.6 cm air-fluid/debris level with thick-wall and surrounding inflammation at the level of the diaphragm abutting the stomach. Findings may represent contained perforation/abscess or gastric diverticulum. 3. Fibroid uterus. 4. Colonic diverticulosis. Electronically Signed   By: Darliss CheneyAmy  Guttmann M.D.   On: 08/16/2021 19:45    Procedures Procedures    Medications Ordered in ED Medications  potassium chloride SA (KLOR-CON M) CR tablet 40 mEq (has no administration in time range)  sodium chloride 0.9 % bolus 1,000 mL (1,000 mLs Intravenous New Bag/Given 08/16/21 1938)  iohexol (OMNIPAQUE) 300 MG/ML solution 80 mL (80 mLs Intravenous Contrast Given 08/16/21 1934)    ED Course/ Medical Decision Making/ A&P                           Medical Decision Making Amount and/or Complexity of Data Reviewed External Data Reviewed: notes. Labs: ordered. Radiology: ordered and independent interpretation performed.  Risk Prescription  drug management.   Patient is overall well-appearing and has no abdominal tenderness.  CT images viewed by myself and shows a sizable hiatal hernia.  No obvious obstruction.  She has not had any vomiting and has had nausea only a couple times in the last couple weeks.  I have discussed with Dr. Derrell Lolling who is reviewed the images as well and feels that this is unlikely to be a surgical emergency and that she can be followed as an outpatient.  Unlikely to have an abscess given her well appearance and normal WBC.  This is probably a diverticulum.  At this point, she still feels well and has no pain at think is reasonable to discharge home.  We will start her on Protonix.  We will have her follow-up with GI and general surgery as an outpatient.  We  will replace her potassium which is mildly low here.  I have viewed/interpreted the labs which show the mild hypokalemia, mild ketones in the urine but is otherwise fairly unremarkable.  Will discharge home with return precautions.        Final Clinical Impression(s) / ED Diagnoses Final diagnoses:  Hiatal hernia    Rx / DC Orders ED Discharge Orders          Ordered    Ambulatory referral to Gastroenterology        08/16/21 2016    pantoprazole (PROTONIX) 40 MG tablet  Daily        08/16/21 2017              Pricilla Loveless, MD 08/16/21 2020

## 2021-09-09 ENCOUNTER — Encounter: Payer: Self-pay | Admitting: Emergency Medicine

## 2021-11-06 ENCOUNTER — Other Ambulatory Visit: Payer: Self-pay | Admitting: Family Medicine

## 2021-11-06 DIAGNOSIS — R030 Elevated blood-pressure reading, without diagnosis of hypertension: Secondary | ICD-10-CM

## 2022-01-07 ENCOUNTER — Ambulatory Visit: Payer: Medicare Other | Attending: Nurse Practitioner | Admitting: Nurse Practitioner

## 2022-01-07 ENCOUNTER — Encounter: Payer: Self-pay | Admitting: Nurse Practitioner

## 2022-01-07 VITALS — BP 131/78 | HR 71 | Temp 97.7°F | Ht 63.0 in | Wt 119.2 lb

## 2022-01-07 DIAGNOSIS — I10 Essential (primary) hypertension: Secondary | ICD-10-CM

## 2022-01-07 DIAGNOSIS — R634 Abnormal weight loss: Secondary | ICD-10-CM | POA: Diagnosis not present

## 2022-01-07 DIAGNOSIS — Z1231 Encounter for screening mammogram for malignant neoplasm of breast: Secondary | ICD-10-CM

## 2022-01-07 DIAGNOSIS — R739 Hyperglycemia, unspecified: Secondary | ICD-10-CM | POA: Diagnosis not present

## 2022-01-07 MED ORDER — AMLODIPINE BESYLATE 10 MG PO TABS: ORAL_TABLET | ORAL | 1 refills | Status: DC

## 2022-01-07 NOTE — Progress Notes (Signed)
Assessment & Plan:  Alyssa Peterson was seen today for hypertension.  Diagnoses and all orders for this visit:  Essential hypertension -     amLODipine (NORVASC) 10 MG tablet; TAKE 1 TABLET(10 MG) BY MOUTH DAILY Continue all antihypertensives as prescribed.  Reminded to bring in blood pressure log for follow  up appointment.  RECOMMENDATIONS: DASH/Mediterranean Diets are healthier choices for HTN.   Breast cancer screening by mammogram -     MM 3D SCREEN BREAST BILATERAL; Future -     CMP14+EGFR  Recent unintentional weight loss over several months -     Sedimentation rate -     C-reactive protein -     CBC with Differential -     Thyroid Panel With TSH -     CMP14+EGFR -     Hemoglobin [name] -     HCV Ab w Reflex to Quant PCR -     HIV antibody (with reflex)  Hyperglycemia -     Hemoglobin [name]    Patient has been counseled on age-appropriate routine health concerns for screening and prevention. These are reviewed and up-to-date. Referrals have been placed accordingly. Immunizations are up-to-date or declined.    Subjective:   Chief Complaint  Patient presents with   Hypertension   [name] 71 y.o. female presents to office today for follow-up to hypertension.  She currently has a past medical history of Anemia, GERD, Hypertension, and Inguinal hernia.    HTN Blood pressure is well controlled with amlodipine 10 mg daily.  She stopped taking hydrochlorothiazide 12.5 mg on her own and reports she does not like to take a lot of medication. BP Readings from Last 3 Encounters:  01/07/22 131/78  08/16/21 131/89  03/12/21 122/84      She is concerned about unintentional weight loss over the past several months.  However she does endorse eliminating sweets and sodas as well as cutting back on carbs since she was diagnosed with prediabetes several months ago.  No change in appetite.  She has a 7 pound weight loss since May.  Denies any hematochezia, melena, nausea or  vomiting.  She does not have a history of hypo or hyperthyroidism Wt Readings from Last 3 Encounters:  01/07/22 119 lb 3.2 oz (54.1 kg)  08/16/21 125 lb 10.6 oz (57 kg)  12/04/20 126 lb 4 oz (57.3 kg)     Review of Systems  Constitutional:  Negative for fever, malaise/fatigue and weight loss.       See HPI  HENT: Negative.  Negative for nosebleeds.   Eyes: Negative.  Negative for blurred vision, double vision and photophobia.  Respiratory: Negative.  Negative for cough and shortness of breath.   Cardiovascular: Negative.  Negative for chest pain, palpitations and leg swelling.  Gastrointestinal: Negative.  Negative for heartburn, nausea and vomiting.  Musculoskeletal: Negative.  Negative for myalgias.  Neurological: Negative.  Negative for dizziness, focal weakness, seizures and headaches.  Psychiatric/Behavioral: Negative.  Negative for suicidal ideas.     Past Medical History:  Diagnosis Date   Anemia    GERD (gastroesophageal reflux disease)    Hypertension    Inguinal hernia    left    Past Surgical History:  Procedure Laterality Date   CYST REMOVAL NECK     DENTAL SURGERY     FEMORAL HERNIA REPAIR Left 07/17/2016   Procedure: LEFT FEMORAL  HERNIA REPAIR WITH MESH;  Surgeon: Donnie Mesa, MD;  Location: [facility];  Service:  General;  Laterality: Left;   GASTROJEJUNOSTOMY N/A 07/04/2020   Procedure: GASTROSTOMY TUBE;  Surgeon: Clovis Riley, MD;  Location: [place];  Service: General;  Laterality: N/A;   LAPAROTOMY N/A 07/04/2020   Procedure: EXPLORATORY LAPAROTOMY;  Surgeon: Clovis Riley, MD;  Location: [place];  Service: General;  Laterality: N/A;    History reviewed. No pertinent family history.  Social History Reviewed with no changes to be made today.   Outpatient Medications Prior to Visit  Medication Sig Dispense Refill   amLODipine (NORVASC) 10 MG tablet TAKE 1 TABLET(10 MG) BY MOUTH DAILY 90 tablet 0   hydrochlorothiazide (MICROZIDE) 12.5  MG capsule TAKE 1 CAPSULE(12.5 MG) BY MOUTH DAILY (Patient not taking: Reported on 01/07/2022) 90 capsule 0   pantoprazole (PROTONIX) 40 MG tablet Take 1 tablet (40 mg total) by mouth daily. (Patient not taking: Reported on 01/07/2022) 14 tablet 0   No facility-administered medications prior to visit.    Allergies  Allergen Reactions   Latex Itching    hands   Penicillins Hives and Rash       Objective:    BP 131/78   Pulse 71   Temp 97.7 F (36.5 C) (Temporal)   Ht '5\' 3"'  (1.6 m)   Wt 119 lb 3.2 oz (54.1 kg)   SpO2 100%   BMI 21.12 kg/m  Wt Readings from Last 3 Encounters:  01/07/22 119 lb 3.2 oz (54.1 kg)  08/16/21 125 lb 10.6 oz (57 kg)  12/04/20 126 lb 4 oz (57.3 kg)    Physical Exam Vitals and nursing note reviewed.  Constitutional:      Appearance: She is well-developed.  HENT:     Head: Normocephalic and atraumatic.  Cardiovascular:     Rate and Rhythm: Normal rate and regular rhythm.     Heart sounds: Normal heart sounds. No murmur heard.    No friction rub. No gallop.  Pulmonary:     Effort: Pulmonary effort is normal. No tachypnea or respiratory distress.     Breath sounds: Normal breath sounds. No decreased breath sounds, wheezing, rhonchi or rales.  Chest:     Chest wall: No tenderness.  Abdominal:     General: Bowel sounds are normal.     Palpations: Abdomen is soft.  Musculoskeletal:        General: Normal range of motion.     Cervical back: Normal range of motion.  Skin:    General: Skin is warm and dry.  Neurological:     Mental Status: She is alert and oriented to person, place, and time.     Coordination: Coordination normal.  Psychiatric:        Behavior: Behavior normal. Behavior is cooperative.        Thought Content: Thought content normal.        Judgment: Judgment normal.          Patient has been counseled extensively about nutrition and exercise as well as the importance of adherence with medications and regular follow-up.  The patient was given clear instructions to go to ER or return to medical center if symptoms don't improve, worsen or new problems develop. The patient verbalized understanding.   Follow-up: Return in about 3 months (around 04/09/2022) for HTN.   Gildardo Pounds, FNP-BC [address] [facility] [place], [place]   01/07/2022, 3:26 PM

## 2022-01-07 NOTE — Progress Notes (Signed)
Pt experiencing unintentional weight loss.

## 2022-01-08 LAB — CMP14+EGFR
ALT: 19 IU/L (ref 0–32)
AST: 21 IU/L (ref 0–40)
Albumin/Globulin Ratio: 1.3 (ref 1.2–2.2)
Albumin: 4 g/dL (ref 3.9–4.9)
Alkaline Phosphatase: 129 IU/L — ABNORMAL HIGH (ref 44–121)
BUN/Creatinine Ratio: 15 (ref 12–28)
BUN: 13 mg/dL (ref 8–27)
Bilirubin Total: <0.2 mg/dL (ref 0.0–1.2)
CO2: 22 mmol/L (ref 20–29)
Calcium: 9.5 mg/dL (ref 8.7–10.3)
Chloride: 104 mmol/L (ref 96–106)
Creatinine, Ser: 0.84 mg/dL (ref 0.57–1.00)
Globulin, Total: 3 g/dL (ref 1.5–4.5)
Glucose: 92 mg/dL (ref 70–99)
Potassium: 4.4 mmol/L (ref 3.5–5.2)
Sodium: 142 mmol/L (ref 134–144)
Total Protein: 7 g/dL (ref 6.0–8.5)
eGFR: 76 mL/min/{1.73_m2} (ref >59)

## 2022-01-08 LAB — CBC WITH DIFFERENTIAL/PLATELET
Basophils Absolute: 0 10*3/uL (ref 0.0–0.2)
Basos: 1 %
EOS (ABSOLUTE): 0.2 10*3/uL (ref 0.0–0.4)
Eos: 6 %
Hematocrit: 37.1 % (ref 34.0–46.6)
Hemoglobin: 12.2 g/dL (ref 11.1–15.9)
Immature Grans (Abs): 0 10*3/uL (ref 0.0–0.1)
Immature Granulocytes: 0 %
Lymphocytes Absolute: 1.1 10*3/uL (ref 0.7–3.1)
Lymphs: 34 %
MCH: 27.8 pg (ref 26.6–33.0)
MCHC: 32.9 g/dL (ref 31.5–35.7)
MCV: 85 fL (ref 79–97)
Monocytes Absolute: 0.3 10*3/uL (ref 0.1–0.9)
Monocytes: 8 %
Neutrophils Absolute: 1.7 10*3/uL (ref 1.4–7.0)
Neutrophils: 51 %
Platelets: 280 10*3/uL (ref 150–450)
RBC: 4.39 x10E6/uL (ref 3.77–5.28)
RDW: 14 % (ref 11.7–15.4)
WBC: 3.3 10*3/uL — ABNORMAL LOW (ref 3.4–10.8)

## 2022-01-08 LAB — THYROID PANEL WITH TSH
Free Thyroxine Index: 2.1 (ref 1.2–4.9)
T3 Uptake Ratio: 25 % (ref 24–39)
T4, Total: 8.2 ug/dL (ref 4.5–12.0)
TSH: 0.669 u[IU]/mL (ref 0.450–4.500)

## 2022-01-08 LAB — C-REACTIVE PROTEIN: CRP: 14 mg/L — ABNORMAL HIGH (ref 0–10)

## 2022-01-08 LAB — SEDIMENTATION RATE: Sed Rate: 15 mm/hr (ref 0–40)

## 2022-01-08 LAB — HEMOGLOBIN A1C
Est. average glucose Bld gHb Est-mCnc: 123 mg/dL
Hgb A1c MFr Bld: 5.9 % — ABNORMAL HIGH (ref 4.8–5.6)

## 2022-01-08 LAB — HCV AB W REFLEX TO QUANT PCR: HCV Ab: NONREACTIVE

## 2022-01-08 LAB — HCV INTERPRETATION

## 2022-01-08 LAB — HIV ANTIBODY (ROUTINE TESTING W REFLEX): HIV Screen 4th Generation wRfx: NONREACTIVE

## 2022-01-17 ENCOUNTER — Ambulatory Visit: Payer: Medicare Other | Attending: Nurse Practitioner

## 2022-01-17 ENCOUNTER — Telehealth: Payer: Self-pay

## 2022-01-17 NOTE — Telephone Encounter (Signed)
Unsuccessful attempt to reach patient on preferred number listed in notes for scheduled AWV. Left message on voicemail okay to reschedule. 

## 2022-01-21 ENCOUNTER — Ambulatory Visit: Payer: Medicare Other | Attending: Nurse Practitioner | Admitting: Pharmacist

## 2022-01-21 ENCOUNTER — Encounter: Payer: Self-pay | Admitting: Pharmacist

## 2022-01-21 VITALS — BP 127/90

## 2022-01-21 DIAGNOSIS — I10 Essential (primary) hypertension: Secondary | ICD-10-CM | POA: Diagnosis not present

## 2022-01-21 NOTE — Progress Notes (Signed)
   S:    PCP: Zelda   Patient arrives in good spirits. Presents to the clinic for hypertension evaluation, counseling, and management. Patient was referred and last seen by Primary Care Provider, Geryl Rankins, on 01/07/2022. She stopped HCTZ as she did not want to take medications. She still continued with the amlodipine. BP at that visit was 131/78 mmHg.   Medication adherence reported.  Current BP Medications include:   -Amlodipine 10 mg daily  Antihypertensives tried in the past include: none  Dietary habits include: salads and chicken mostly. Patient reports [employer] and using salt with cooking Exercise habits include: walking about 30 minutes per day Family / Social history: never smoker  ASCVD risk factors include: HTN, age  O:  Vitals:   01/21/22 1739  BP: (!) 127/90   Home BP readings: not checked in the last two weeks  Patient was educated on how to use BP machine at home and agrees to start home blood pressure monitoring.  Last 3 Office BP readings: BP Readings from Last 3 Encounters:  01/21/22 (!) 127/90  01/07/22 131/78  08/16/21 131/89   BMET    Component Value Date/Time   NA 142 01/07/2022 0940   K 4.4 01/07/2022 0940   CL 104 01/07/2022 0940   CO2 22 01/07/2022 0940   GLUCOSE 92 01/07/2022 0940   GLUCOSE 113 (H) 08/16/2021 1658   BUN 13 01/07/2022 0940   CREATININE 0.84 01/07/2022 0940   CALCIUM 9.5 01/07/2022 0940   GFRNONAA >60 08/16/2021 1658   GFRAA >60 04/01/2018 2122    Renal function: CrCl cannot be calculated (Unknown ideal weight.).  Clinical ASCVD: No  The ASCVD Risk score (Arnett DK, et al., 2019) failed to calculate for the following reasons:   Cannot find a previous HDL lab   Cannot find a previous total cholesterol lab   A/P: Hypertension is currently at goal on current medications. BP Goal = < 130/80 mmHg. Medication adherence reported.  -Continued amlodipine 10 mg daily. -Continue HCTZ 12.5 mg daily. -Counseled on  lifestyle modifications for blood pressure control including reduced dietary sodium, increased exercise, adequate sleep.  Results reviewed and written information provided. Total time in face-to-face counseling 30 minutes.    Benard Halsted, PharmD, [place], [place] [phone]

## 2022-02-20 ENCOUNTER — Other Ambulatory Visit: Payer: Medicare Other

## 2022-02-26 ENCOUNTER — Other Ambulatory Visit: Payer: Self-pay | Admitting: Nurse Practitioner

## 2022-02-26 ENCOUNTER — Ambulatory Visit: Payer: Medicare Other | Attending: Nurse Practitioner

## 2022-02-26 DIAGNOSIS — D72819 Decreased white blood cell count, unspecified: Secondary | ICD-10-CM

## 2022-02-27 LAB — CBC WITH DIFFERENTIAL/PLATELET
Basophils Absolute: 0 10*3/uL (ref 0.0–0.2)
Basos: 1 %
EOS (ABSOLUTE): 0.2 10*3/uL (ref 0.0–0.4)
Eos: 6 %
Hematocrit: 38.8 % (ref 34.0–46.6)
Hemoglobin: 13 g/dL (ref 11.1–15.9)
Immature Grans (Abs): 0 10*3/uL (ref 0.0–0.1)
Immature Granulocytes: 0 %
Lymphocytes Absolute: 1 10*3/uL (ref 0.7–3.1)
Lymphs: 36 %
MCH: 28.4 pg (ref 26.6–33.0)
MCHC: 33.5 g/dL (ref 31.5–35.7)
MCV: 85 fL (ref 79–97)
Monocytes Absolute: 0.3 10*3/uL (ref 0.1–0.9)
Monocytes: 11 %
Neutrophils Absolute: 1.3 10*3/uL — ABNORMAL LOW (ref 1.4–7.0)
Neutrophils: 46 %
Platelets: 198 10*3/uL (ref 150–450)
RBC: 4.57 x10E6/uL (ref 3.77–5.28)
RDW: 14.4 % (ref 11.7–15.4)
WBC: 2.7 10*3/uL — ABNORMAL LOW (ref 3.4–10.8)

## 2022-03-01 ENCOUNTER — Other Ambulatory Visit: Payer: Self-pay | Admitting: Nurse Practitioner

## 2022-03-01 DIAGNOSIS — D72819 Decreased white blood cell count, unspecified: Secondary | ICD-10-CM

## 2022-03-03 ENCOUNTER — Telehealth: Payer: Self-pay

## 2022-03-03 NOTE — Telephone Encounter (Signed)
-----   Message from Claiborne Rigg, NP sent at 03/01/2022  6:11 PM EST ----- White blood count lower than normal. Will refer to blood specialist for further work up. They will call you to schedule.

## 2022-03-03 NOTE — Telephone Encounter (Signed)
Pt was called and vm was left, Information has been sent to nurse pool.   

## 2022-03-24 ENCOUNTER — Inpatient Hospital Stay (HOSPITAL_COMMUNITY)
Admission: EM | Admit: 2022-03-24 | Discharge: 2022-04-11 | DRG: 326 | Disposition: A | Discharge status: Home / Self Care | Payer: Medicare Other | Attending: Surgery | Admitting: Surgery

## 2022-03-24 ENCOUNTER — Encounter (HOSPITAL_COMMUNITY): Payer: Self-pay

## 2022-03-24 ENCOUNTER — Other Ambulatory Visit: Payer: Self-pay

## 2022-03-24 ENCOUNTER — Emergency Department (HOSPITAL_COMMUNITY): Payer: Medicare Other

## 2022-03-24 DIAGNOSIS — K449 Diaphragmatic hernia without obstruction or gangrene: Secondary | ICD-10-CM

## 2022-03-24 DIAGNOSIS — K255 Chronic or unspecified gastric ulcer with perforation: Secondary | ICD-10-CM | POA: Diagnosis present

## 2022-03-24 DIAGNOSIS — R002 Palpitations: Secondary | ICD-10-CM | POA: Diagnosis not present

## 2022-03-24 DIAGNOSIS — Z91040 Latex allergy status: Secondary | ICD-10-CM

## 2022-03-24 DIAGNOSIS — Z880 Allergy status to penicillin: Secondary | ICD-10-CM | POA: Diagnosis not present

## 2022-03-24 DIAGNOSIS — D649 Anemia, unspecified: Secondary | ICD-10-CM | POA: Diagnosis not present

## 2022-03-24 DIAGNOSIS — J948 Other specified pleural conditions: Secondary | ICD-10-CM | POA: Diagnosis not present

## 2022-03-24 DIAGNOSIS — I10 Essential (primary) hypertension: Secondary | ICD-10-CM | POA: Diagnosis present

## 2022-03-24 DIAGNOSIS — K551 Chronic vascular disorders of intestine: Secondary | ICD-10-CM | POA: Diagnosis not present

## 2022-03-24 DIAGNOSIS — Z6823 Body mass index (BMI) 23.0-23.9, adult: Secondary | ICD-10-CM

## 2022-03-24 DIAGNOSIS — K660 Peritoneal adhesions (postprocedural) (postinfection): Secondary | ICD-10-CM | POA: Diagnosis present

## 2022-03-24 DIAGNOSIS — K440 Diaphragmatic hernia with obstruction, without gangrene: Principal | ICD-10-CM | POA: Diagnosis present

## 2022-03-24 DIAGNOSIS — E876 Hypokalemia: Secondary | ICD-10-CM | POA: Diagnosis present

## 2022-03-24 DIAGNOSIS — B379 Candidiasis, unspecified: Secondary | ICD-10-CM | POA: Diagnosis present

## 2022-03-24 DIAGNOSIS — N739 Female pelvic inflammatory disease, unspecified: Secondary | ICD-10-CM | POA: Diagnosis not present

## 2022-03-24 DIAGNOSIS — K651 Peritoneal abscess: Secondary | ICD-10-CM

## 2022-03-24 DIAGNOSIS — J90 Pleural effusion, not elsewhere classified: Secondary | ICD-10-CM | POA: Diagnosis not present

## 2022-03-24 DIAGNOSIS — J9811 Atelectasis: Secondary | ICD-10-CM | POA: Diagnosis not present

## 2022-03-24 DIAGNOSIS — J869 Pyothorax without fistula: Secondary | ICD-10-CM | POA: Diagnosis not present

## 2022-03-24 DIAGNOSIS — K562 Volvulus: Secondary | ICD-10-CM

## 2022-03-24 DIAGNOSIS — R160 Hepatomegaly, not elsewhere classified: Secondary | ICD-10-CM | POA: Diagnosis not present

## 2022-03-24 DIAGNOSIS — E43 Unspecified severe protein-calorie malnutrition: Secondary | ICD-10-CM | POA: Diagnosis present

## 2022-03-24 DIAGNOSIS — K429 Umbilical hernia without obstruction or gangrene: Secondary | ICD-10-CM | POA: Diagnosis present

## 2022-03-24 DIAGNOSIS — H409 Unspecified glaucoma: Secondary | ICD-10-CM | POA: Diagnosis present

## 2022-03-24 DIAGNOSIS — R000 Tachycardia, unspecified: Secondary | ICD-10-CM | POA: Diagnosis not present

## 2022-03-24 DIAGNOSIS — R1013 Epigastric pain: Principal | ICD-10-CM

## 2022-03-24 DIAGNOSIS — K219 Gastro-esophageal reflux disease without esophagitis: Secondary | ICD-10-CM | POA: Diagnosis present

## 2022-03-24 LAB — CBC WITH DIFFERENTIAL/PLATELET
Abs Immature Granulocytes: 0.01 10*3/uL (ref 0.00–0.07)
Basophils Absolute: 0 10*3/uL (ref 0.0–0.1)
Basophils Relative: 0 %
Eosinophils Absolute: 0 10*3/uL (ref 0.0–0.5)
Eosinophils Relative: 1 %
HCT: 43.2 % (ref 36.0–46.0)
Hemoglobin: 13.9 g/dL (ref 12.0–15.0)
Immature Granulocytes: 0 %
Lymphocytes Relative: 19 %
Lymphs Abs: 1.1 10*3/uL (ref 0.7–4.0)
MCH: 29 pg (ref 26.0–34.0)
MCHC: 32.2 g/dL (ref 30.0–36.0)
MCV: 90 fL (ref 80.0–100.0)
Monocytes Absolute: 0.2 10*3/uL (ref 0.1–1.0)
Monocytes Relative: 3 %
Neutro Abs: 4.5 10*3/uL (ref 1.7–7.7)
Neutrophils Relative %: 77 %
Platelets: 231 10*3/uL (ref 150–400)
RBC: 4.8 MIL/uL (ref 3.87–5.11)
RDW: 13.7 % (ref 11.5–15.5)
WBC: 5.9 10*3/uL (ref 4.0–10.5)
nRBC: 0 % (ref 0.0–0.2)

## 2022-03-24 LAB — COMPREHENSIVE METABOLIC PANEL
ALT: 67 U/L — ABNORMAL HIGH (ref 0–44)
AST: 73 U/L — ABNORMAL HIGH (ref 15–41)
Albumin: 3.8 g/dL (ref 3.5–5.0)
Alkaline Phosphatase: 85 U/L (ref 38–126)
Anion gap: 13 (ref 5–15)
BUN: 11 mg/dL (ref 8–23)
CO2: 23 mmol/L (ref 22–32)
Calcium: 9.3 mg/dL (ref 8.9–10.3)
Chloride: 103 mmol/L (ref 98–111)
Creatinine, Ser: 1 mg/dL (ref 0.44–1.00)
GFR, Estimated: >60 mL/min (ref >60)
Glucose, Bld: 257 mg/dL — ABNORMAL HIGH (ref 70–99)
Potassium: 2.9 mmol/L — ABNORMAL LOW (ref 3.5–5.1)
Sodium: 139 mmol/L (ref 135–145)
Total Bilirubin: 0.9 mg/dL (ref 0.3–1.2)
Total Protein: 6.9 g/dL (ref 6.5–8.1)

## 2022-03-24 LAB — URINALYSIS, ROUTINE W REFLEX MICROSCOPIC
Bilirubin Urine: NEGATIVE
Glucose, UA: 50 mg/dL — AB
Hgb urine dipstick: NEGATIVE
Ketones, ur: 5 mg/dL — AB
Leukocytes,Ua: NEGATIVE
Nitrite: NEGATIVE
Protein, ur: NEGATIVE mg/dL
Specific Gravity, Urine: >1.046 — ABNORMAL HIGH (ref 1.005–1.030)
pH: 5 (ref 5.0–8.0)

## 2022-03-24 LAB — I-STAT CHEM 8, ED
BUN: 13 mg/dL (ref 8–23)
Calcium, Ion: 1.17 mmol/L (ref 1.15–1.40)
Chloride: 104 mmol/L (ref 98–111)
Creatinine, Ser: 0.8 mg/dL (ref 0.44–1.00)
Glucose, Bld: 253 mg/dL — ABNORMAL HIGH (ref 70–99)
HCT: 43 % (ref 36.0–46.0)
Hemoglobin: 14.6 g/dL (ref 12.0–15.0)
Potassium: 3 mmol/L — ABNORMAL LOW (ref 3.5–5.1)
Sodium: 142 mmol/L (ref 135–145)
TCO2: 24 mmol/L (ref 22–32)

## 2022-03-24 LAB — LIPASE, BLOOD: Lipase: 27 U/L (ref 11–51)

## 2022-03-24 MED ORDER — PNEUMOCOCCAL 20-VAL CONJ VACC 0.5 ML IM SUSY: 0.5000 mL | PREFILLED_SYRINGE | INTRAMUSCULAR | Status: DC | PRN

## 2022-03-24 MED ORDER — DIPHENHYDRAMINE HCL 12.5 MG/5ML PO ELIX: 12.5000 mg | ORAL_SOLUTION | Freq: Four times a day (QID) | ORAL | Status: DC | PRN

## 2022-03-24 MED ORDER — POTASSIUM CHLORIDE 10 MEQ/100ML IV SOLN
10.0000 meq | INTRAVENOUS | Status: AC
  Administered 2022-03-24 (×2): 10 meq via INTRAVENOUS
  Filled 2022-03-24 (×2): qty 100

## 2022-03-24 MED ORDER — ENOXAPARIN SODIUM 40 MG/0.4ML IJ SOSY
40.0000 mg | PREFILLED_SYRINGE | INTRAMUSCULAR | Status: DC
  Administered 2022-03-26 – 2022-04-11 (×13): 40 mg via SUBCUTANEOUS
  Filled 2022-03-24 (×16): qty 0.4

## 2022-03-24 MED ORDER — DIPHENHYDRAMINE HCL 50 MG/ML IJ SOLN: 12.5000 mg | Freq: Four times a day (QID) | INTRAMUSCULAR | Status: DC | PRN

## 2022-03-24 MED ORDER — ACETAMINOPHEN 325 MG PO TABS
650.0000 mg | ORAL_TABLET | Freq: Four times a day (QID) | ORAL | Status: DC | PRN
  Filled 2022-03-24: qty 2

## 2022-03-24 MED ORDER — ONDANSETRON 4 MG PO TBDP: 4.0000 mg | ORAL_TABLET | Freq: Four times a day (QID) | ORAL | Status: DC | PRN

## 2022-03-24 MED ORDER — MORPHINE SULFATE (PF) 2 MG/ML IV SOLN
2.0000 mg | INTRAVENOUS | Status: DC | PRN
  Administered 2022-03-24 – 2022-03-26 (×9): 2 mg via INTRAVENOUS
  Filled 2022-03-24 (×9): qty 1

## 2022-03-24 MED ORDER — INFLUENZA VAC A&B SA ADJ QUAD 0.5 ML IM PRSY: 0.5000 mL | PREFILLED_SYRINGE | INTRAMUSCULAR | Status: DC | PRN

## 2022-03-24 MED ORDER — IOHEXOL 350 MG/ML SOLN
75.0000 mL | Freq: Once | INTRAVENOUS | Status: AC | PRN
  Administered 2022-03-24: 75 mL via INTRAVENOUS

## 2022-03-24 MED ORDER — METOPROLOL TARTRATE 5 MG/5ML IV SOLN: 5.0000 mg | Freq: Four times a day (QID) | INTRAVENOUS | Status: DC | PRN

## 2022-03-24 MED ORDER — ONDANSETRON HCL 4 MG/2ML IJ SOLN
4.0000 mg | Freq: Four times a day (QID) | INTRAMUSCULAR | Status: DC | PRN
  Administered 2022-03-24 – 2022-03-26 (×5): 4 mg via INTRAVENOUS
  Filled 2022-03-24 (×5): qty 2

## 2022-03-24 MED ORDER — OXYCODONE HCL 5 MG PO TABS
5.0000 mg | ORAL_TABLET | ORAL | Status: DC | PRN
  Administered 2022-03-24 – 2022-04-10 (×7): 5 mg via ORAL
  Filled 2022-03-24 (×7): qty 1

## 2022-03-24 MED ORDER — ACETAMINOPHEN 650 MG RE SUPP: 650.0000 mg | Freq: Four times a day (QID) | RECTAL | Status: DC | PRN

## 2022-03-24 MED ORDER — DEXTROSE-NACL 5-0.45 % IV SOLN: INTRAVENOUS | Status: DC

## 2022-03-24 MED ORDER — PANTOPRAZOLE SODIUM 40 MG IV SOLR
40.0000 mg | Freq: Every day | INTRAVENOUS | Status: DC
  Administered 2022-03-24 – 2022-03-26 (×3): 40 mg via INTRAVENOUS
  Filled 2022-03-24 (×3): qty 10

## 2022-03-24 NOTE — ED Triage Notes (Signed)
Pt arrived POV from home c/o generalized abdominal pain and N/V x2 days.

## 2022-03-24 NOTE — ED Provider Triage Note (Signed)
Emergency Medicine Provider Triage Evaluation Note  Kevin Mario Depaoli , a 72 y.o. female  was evaluated in triage.  Pt complains of abdominal pain and vomiting.  She has a history of a femoral hernia repair.  She began to have generalized abdominal pain and vomiting on 03/22/2022.  She continues to have bowel movements.  No urinary symptoms.  No fevers.  She denies other surgeries on the abdomen.  Review of Systems  Positive: Abdominal pain, vomiting Negative: Chest pain, shortness of breath  Physical Exam  Ht 5\' 3"  (1.6 m)   Wt 59 kg   BMI 23.03 kg/m  Gen:   Awake, appears uncomfortable Resp:  Normal effort  MSK:   Moves extremities without difficulty  Other:  Mild generalized abdominal tenderness without rebound or guarding  Medical Decision Making  Medically screening exam initiated at 4:24 AM.  Appropriate orders placed.  Lashante Fryberger Spiker was informed that the remainder of the evaluation will be completed by another provider, this initial triage assessment does not replace that evaluation, and the importance of remaining in the ED until their evaluation is complete.     Carlisle Cater, PA-C 03/24/22 [phone]

## 2022-03-24 NOTE — ED Provider Notes (Signed)
[facility] EMERGENCY DEPARTMENT Provider Note   CSN: [id number] Arrival date & time: 03/24/22  0229     History  Chief Complaint  Patient presents with   Abdominal Pain    Alyssa Peterson is a 72 y.o. female.  HPI 72 year old female history of hypertension, status post repair of paraesophageal hernia, history of gastric volvulus and perforation secondary to this status post exploratory lap and reduction of hernia 07/04/2020 with a G-tube and JP tube who presents today with abdominal pain for the past 2 days.  She had recurrence of pain beginning on Saturday.  She felt better after church yesterday and then again had worsening pain.  She presents to the ED with diffuse crampy abdominal pain.  She had an episode of nausea and vomiting and states that her pain is decreased now but has not attempted any p.o.  She denies fever or chills.  She reports having some soft stool but no diarrhea    Home Medications Prior to Admission medications   Medication Sig Start Date End Date Taking? Authorizing Provider  amLODipine (NORVASC) 10 MG tablet TAKE 1 TABLET(10 MG) BY MOUTH DAILY 01/07/22   Gildardo Pounds, NP      Allergies    Latex and Penicillins    Review of Systems   Review of Systems  Physical Exam Updated Vital Signs BP 129/87   Pulse 76   Temp 98.5 F (36.9 C) (Oral)   Resp 17   Ht 1.6 m (5\' 3" )   Wt 59 kg   SpO2 100%   BMI 23.03 kg/m  Physical Exam Vitals and nursing note reviewed.  Constitutional:      Appearance: She is well-developed.  HENT:     Head: Normocephalic.  Eyes:     Extraocular Movements: Extraocular movements intact.  Cardiovascular:     Rate and Rhythm: Normal rate and regular rhythm.     Heart sounds: Normal heart sounds.  Pulmonary:     Effort: Pulmonary effort is normal.     Breath sounds: Normal breath sounds.  Abdominal:     General: Abdomen is flat. Bowel sounds are increased.     Palpations: Abdomen is soft.      Hernia: No hernia is present.  Skin:    General: Skin is warm and dry.  Neurological:     General: No focal deficit present.     Mental Status: She is alert.  Psychiatric:        Mood and Affect: Mood normal.     ED Results / Procedures / Treatments   Labs (all labs ordered are listed, but only abnormal results are displayed) Labs Reviewed  COMPREHENSIVE METABOLIC PANEL - Abnormal; Notable for the following components:      Result Value   Potassium 2.9 (*)    Glucose, Bld 257 (*)    AST 73 (*)    ALT 67 (*)    All other components within normal limits  URINALYSIS, ROUTINE W REFLEX MICROSCOPIC - Abnormal; Notable for the following components:   Specific Gravity, Urine >1.046 (*)    Glucose, UA 50 (*)    Ketones, ur 5 (*)    All other components within normal limits  I-STAT CHEM 8, ED - Abnormal; Notable for the following components:   Potassium 3.0 (*)    Glucose, Bld 253 (*)    All other components within normal limits  CBC WITH DIFFERENTIAL/PLATELET  LIPASE, BLOOD    EKG None  Radiology CT  ABDOMEN PELVIS W CONTRAST  Result Date: 03/24/2022 CLINICAL DATA:  72 year old female with history of acute onset of nonlocalized abdominal pain. Vomiting. EXAM: CT ABDOMEN AND PELVIS WITH CONTRAST TECHNIQUE: Multidetector CT imaging of the abdomen and pelvis was performed using the standard protocol following bolus administration of intravenous contrast. RADIATION DOSE REDUCTION: This exam was performed according to the departmental dose-optimization program which includes automated exposure control, adjustment of the mA and/or kV according to patient size and/or use of iterative reconstruction technique. CONTRAST:  63mL OMNIPAQUE IOHEXOL 350 MG/ML SOLN COMPARISON:  CT of the abdomen and pelvis 06/16/2021. FINDINGS: Lower chest: Large hiatal hernia partially imaged, with greater than 50% of the stomach intrathoracic in position. Hepatobiliary: 1.4 cm low-attenuation lesion in segment 8 of  the liver, compatible with a simple cyst (no imaging follow-up recommended). No other suspicious appearing hepatic lesions. No intra or extrahepatic biliary ductal dilatation. Gallbladder is unremarkable in appearance Pancreas: No pancreatic mass. No pancreatic ductal dilatation. No pancreatic or peripancreatic fluid collections or inflammatory changes. Spleen: Unremarkable. Adrenals/Urinary Tract: Bilateral kidneys and adrenal glands are normal in appearance. No hydroureteronephrosis. Urinary bladder is nearly completely decompressed, but otherwise unremarkable in appearance. Stomach/Bowel: Large hiatal hernia with greater than 50% of the stomach in the intrathoracic position. Stomach appears rotated along its long axis (i.e., this appears to represent an organo-axial volvulus), similar to prior studies. Intrathoracic and intra-abdominal portions of the stomach are both distended, and there is a large air-fluid level in the intrathoracic portion of the stomach. Compression and narrowing of the antral pre-pyloric region of the stomach in the proximal duodenum is noted, which may suggest gastric outlet obstruction. No pathologic dilatation of other portions of small bowel or colon. A few scattered colonic diverticula are noted, without surrounding inflammatory changes to indicate an acute diverticulitis at this time. Normal appendix. Vascular/Lymphatic: No significant atherosclerotic disease, aneurysm or dissection noted in the abdominal or pelvic vasculature. Circumaortic left renal vein (normal anatomical variant) incidentally noted. No lymphadenopathy noted in the abdomen or pelvis. Reproductive: Retroverted uterus which is heterogeneous in appearance with multiple lesions, some of which are calcified, largest of which is located in the posterior aspect of the body of the uterus, encroaching upon the endometrial canal measuring up to 2.2 cm in diameter (image sagittal image 66 of series 8), most compatible with  fibroids. Ovaries are unremarkable in appearance. Other: No significant volume of ascites.  No pneumoperitoneum. Musculoskeletal: There are no aggressive appearing lytic or blastic lesions noted in the visualized portions of the skeleton. IMPRESSION: 1. 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 Intrathoracic and intra-abdominal portions of the stomach are distended with a large air-fluid level, and the pyloric region and proximal duodenum appear compressed secondary to this altered anatomy, which may suggest associated gastric outlet obstruction. Surgical consultation is recommended. 2. No other acute findings are noted in the abdomen or pelvis on today's examination. 3. Fibroid uterus. Electronically Signed   By: Vinnie Langton M.D.   On: 03/24/2022 06:17    Procedures Procedures    Medications Ordered in ED Medications  iohexol (OMNIPAQUE) 350 MG/ML injection 75 mL (75 mLs Intravenous Contrast Given 03/24/22 0521)    ED Course/ Medical Decision Making/ A&P Clinical Course as of 03/24/22 1342  Mon Mar 24, 2022  3329 Complete metabolic panel reviewed and interpreted significant for mild hypokalemia with potassium of 2.9 Hyperglycemia with glucose of 257 Mild transaminitis with AST 73 and ALT 67 [DR]  1100 CBC  is reviewed interpreted within normal limits [DR]  1101 CT abdomen and pelvis is reviewed and interpreted and significant for large stomach with air-fluid level radiologist interpretation notes large hiatal hernia with chronic organ out axial gastric volvulus similar to prior studies with stomach distended with large air-fluid levels pyloric and proximal duodenum appear compressed secondary to altered anatomy question of gastric outlet obstruction with no other acute findings noted [DR]    Clinical Course User Index [DR] Margarita Grizzle, MD                           Medical Decision Making Risk Decision regarding hospitalization.   This  patient presents to the ED for concern of abdominal pain with nausea, this involves an extensive number of treatment options, and is a complaint that carries with it a high risk of complications and morbidity.  The differential diagnosis includes gastritis, small bowel obstruction, volvulus, biliary etiology   Co morbidities that complicate the patient evaluation  Patient with history of volvulus and gastric perforation   Additional history obtained:  Additional history obtained from prior general surgery inpatient notes External records from outside source obtained and reviewed including reviewed notes from general surgery admission 07/04/2020 through 07/09/2020 at which time she had a perforated abdominal viscus in the setting of large paraesophageal hernia with massively dilated stomach which has had somewhat similar to today's presentation   Lab Tests:  I Ordered, and personally interpreted labs.  The pertinent results include: CBC and complete metabolic panel reviewed and interpreted significant for hypokalemia with mildly elevated transaminases and elevated blood glucose   Imaging Studies ordered:  I ordered imaging studies including CT scan of the abdomen pelvis I independently visualized and interpreted imaging which showed large hiatal hernia with gastric volvulus and possible gastric outlet syndrome I agree with the radiologist interpretation   Cardiac Monitoring: / EKG:  The patient was maintained on a cardiac monitor.  I personally viewed and interpreted the cardiac monitored which showed an underlying rhythm of: Patient is monitored and heart rate and blood pressure are currently within normal limits   Consultations Obtained:  I requested consultation with the general surgery,  and discussed lab and imaging findings as well as pertinent plan - they recommend: Admission orders placed by general surgery   Problem List / ED Course / Critical interventions / Medication  management  Hiatal hernia with volvulus and possible gastric outlet obstruction I ordered medication including n.p.o. and IV fluids for hydration currently patient is pain-free and has vomited Reevaluation of the patient after these medicines showed that the patient improved I have reviewed the patients home medicines and have made adjustments as needed   Social Determinants of Health:     Test / Admission - Considered:  CT scan ordered and obtained today Patient appears stable General surgery consulted         Final Clinical Impression(s) / ED Diagnoses Final diagnoses:  Epigastric pain  Volvulus (HCC)    Rx / DC Orders ED Discharge Orders     None         Margarita Grizzle, MD 03/24/22 [phone]

## 2022-03-24 NOTE — ED Notes (Signed)
Patient unable to get a Urinalysis at this time.

## 2022-03-24 NOTE — H&P (Signed)
Reason for Consult:vomiting Referring Provider: [name] is an 72 y.o. female.  HPI: 72 yo female had abdominal pain and vomiting. She has had trouble with eating for multiple months often leading to regurgitation or belching. Today she had epigastric pain and nausea. She had multiple bowel movements. She vomited in the Ed and now the pain has resolved.  Past Medical History:  Diagnosis Date   Anemia    GERD (gastroesophageal reflux disease)    Hypertension    Inguinal hernia    left    Past Surgical History:  Procedure Laterality Date   CYST REMOVAL NECK     DENTAL SURGERY     FEMORAL HERNIA REPAIR Left 07/17/2016   Procedure: LEFT FEMORAL  HERNIA REPAIR WITH MESH;  Surgeon: Donnie Mesa, MD;  Location: [facility];  Service: General;  Laterality: Left;   GASTROJEJUNOSTOMY N/A 07/04/2020   Procedure: GASTROSTOMY TUBE;  Surgeon: Clovis Riley, MD;  Location: [facility];  Service: General;  Laterality: N/A;   LAPAROTOMY N/A 07/04/2020   Procedure: EXPLORATORY LAPAROTOMY;  Surgeon: Clovis Riley, MD;  Location: [facility];  Service: General;  Laterality: N/A;    History reviewed. No pertinent family history.  Social History:  reports that she has never smoked. She has never used smokeless tobacco. She reports that she does not drink alcohol and does not use drugs.  Allergies:  Allergies  Allergen Reactions   Latex Itching    hands   Penicillins Hives and Rash    Medications: I have reviewed the patient's current medications.  Results for orders placed or performed during the hospital encounter of 03/24/22 (from the past 48 hour(s))  I-stat chem 8, ED     Status: Abnormal   Collection Time: 03/24/22  4:47 AM  Result Value Ref Range   Sodium 142 135 - 145 mmol/L   Potassium 3.0 (L) 3.5 - 5.1 mmol/L   Chloride 104 98 - 111 mmol/L   BUN 13 8 - 23 mg/dL   Creatinine, Ser 0.80 0.44 - 1.00 mg/dL   Glucose, Bld 253 (H) 70 - 99 mg/dL     Comment: Glucose reference range applies only to samples taken after fasting for at least 8 hours.   Calcium, Ion 1.17 1.15 - 1.40 mmol/L   TCO2 24 22 - 32 mmol/L   Hemoglobin 14.6 12.0 - 15.0 g/dL   HCT 43.0 36.0 - 46.0 %  CBC with Differential     Status: None   Collection Time: 03/24/22  4:56 AM  Result Value Ref Range   WBC 5.9 4.0 - 10.5 K/uL   RBC 4.80 3.87 - 5.11 MIL/uL   Hemoglobin 13.9 12.0 - 15.0 g/dL   HCT 43.2 36.0 - 46.0 %   MCV 90.0 80.0 - 100.0 fL   MCH 29.0 26.0 - 34.0 pg   MCHC 32.2 30.0 - 36.0 g/dL   RDW 13.7 11.5 - 15.5 %   Platelets 231 150 - 400 K/uL   nRBC 0.0 0.0 - 0.2 %   Neutrophils Relative % 77 %   Neutro Abs 4.5 1.7 - 7.7 K/uL   Lymphocytes Relative 19 %   Lymphs Abs 1.1 0.7 - 4.0 K/uL   Monocytes Relative 3 %   Monocytes Absolute 0.2 0.1 - 1.0 K/uL   Eosinophils Relative 1 %   Eosinophils Absolute 0.0 0.0 - 0.5 K/uL   Basophils Relative 0 %   Basophils Absolute 0.0 0.0 - 0.1 K/uL  Immature Granulocytes 0 %   Abs Immature Granulocytes 0.01 0.00 - 0.07 K/uL    Comment: Performed at [facility], [place] [address] [place], [place] [address]  Comprehensive metabolic panel     Status: Abnormal   Collection Time: 03/24/22  4:56 AM  Result Value Ref Range   Sodium 139 135 - 145 mmol/L   Potassium 2.9 (L) 3.5 - 5.1 mmol/L   Chloride 103 98 - 111 mmol/L   CO2 23 22 - 32 mmol/L   Glucose, Bld 257 (H) 70 - 99 mg/dL    Comment: Glucose reference range applies only to samples taken after fasting for at least 8 hours.   BUN 11 8 - 23 mg/dL   Creatinine, Ser 1.00 0.44 - 1.00 mg/dL   Calcium 9.3 8.9 - 10.3 mg/dL   Total Protein 6.9 6.5 - 8.1 g/dL   Albumin 3.8 3.5 - 5.0 g/dL   AST 73 (H) 15 - 41 U/L   ALT 67 (H) 0 - 44 U/L   Alkaline Phosphatase 85 38 - 126 U/L   Total Bilirubin 0.9 0.3 - 1.2 mg/dL   GFR, Estimated >60 >60 mL/min    Comment: (NOTE) Calculated using the CKD-EPI Creatinine Equation (2021)    Anion gap 13 5 - 15    Comment:  Performed at [facility] [address], [place], [place] [address]  Lipase, blood     Status: None   Collection Time: 03/24/22  4:56 AM  Result Value Ref Range   Lipase 27 11 - 51 U/L    Comment: Performed at [facility] [address] [place], [place] [address]  Urinalysis, Routine w reflex microscopic Urine, Clean Catch     Status: Abnormal   Collection Time: 03/24/22  7:20 AM  Result Value Ref Range   Color, Urine YELLOW YELLOW   APPearance CLEAR CLEAR   Specific Gravity, Urine >1.046 (H) 1.005 - 1.030   pH 5.0 5.0 - 8.0   Glucose, UA 50 (A) NEGATIVE mg/dL   Hgb urine dipstick NEGATIVE NEGATIVE   Bilirubin Urine NEGATIVE NEGATIVE   Ketones, ur 5 (A) NEGATIVE mg/dL   Protein, ur NEGATIVE NEGATIVE mg/dL   Nitrite NEGATIVE NEGATIVE   Leukocytes,Ua NEGATIVE NEGATIVE    Comment: Performed at [facility] [address] [place], [place] [address]    CT ABDOMEN PELVIS W CONTRAST  Result Date: 03/24/2022 CLINICAL DATA:  72 year old female with history of acute onset of nonlocalized abdominal pain. Vomiting. EXAM: CT ABDOMEN AND PELVIS WITH CONTRAST TECHNIQUE: Multidetector CT imaging of the abdomen and pelvis was performed using the standard protocol following bolus administration of intravenous contrast. RADIATION DOSE REDUCTION: This exam was performed according to the departmental dose-optimization program which includes automated exposure control, adjustment of the mA and/or kV according to patient size and/or use of iterative reconstruction technique. CONTRAST:  38mL OMNIPAQUE IOHEXOL 350 MG/ML SOLN COMPARISON:  CT of the abdomen and pelvis 06/16/2021. FINDINGS: Lower chest: Large hiatal hernia partially imaged, with greater than 50% of the stomach intrathoracic in position. Hepatobiliary: 1.4 cm low-attenuation lesion in segment 8 of the liver, compatible with a simple cyst (no imaging follow-up recommended). No other suspicious appearing hepatic lesions. No intra  or extrahepatic biliary ductal dilatation. Gallbladder is unremarkable in appearance Pancreas: No pancreatic mass. No pancreatic ductal dilatation. No pancreatic or peripancreatic fluid collections or inflammatory changes. Spleen: Unremarkable. Adrenals/Urinary Tract: Bilateral kidneys and adrenal glands are normal in appearance. No  hydroureteronephrosis. Urinary bladder is nearly completely decompressed, but otherwise unremarkable in appearance. Stomach/Bowel: Large hiatal hernia with greater than 50% of the stomach in the intrathoracic position. Stomach appears rotated along its long axis (i.e., this appears to represent an organo-axial volvulus), similar to prior studies. Intrathoracic and intra-abdominal portions of the stomach are both distended, and there is a large air-fluid level in the intrathoracic portion of the stomach. Compression and narrowing of the antral pre-pyloric region of the stomach in the proximal duodenum is noted, which may suggest gastric outlet obstruction. No pathologic dilatation of other portions of small bowel or colon. A few scattered colonic diverticula are noted, without surrounding inflammatory changes to indicate an acute diverticulitis at this time. Normal appendix. Vascular/Lymphatic: No significant atherosclerotic disease, aneurysm or dissection noted in the abdominal or pelvic vasculature. Circumaortic left renal vein (normal anatomical variant) incidentally noted. No lymphadenopathy noted in the abdomen or pelvis. Reproductive: Retroverted uterus which is heterogeneous in appearance with multiple lesions, some of which are calcified, largest of which is located in the posterior aspect of the body of the uterus, encroaching upon the endometrial canal measuring up to 2.2 cm in diameter (image sagittal image 66 of series 8), most compatible with fibroids. Ovaries are unremarkable in appearance. Other: No significant volume of ascites.  No pneumoperitoneum. Musculoskeletal: There  are no aggressive appearing lytic or blastic lesions noted in the visualized portions of the skeleton. IMPRESSION: 1. 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 Intrathoracic and intra-abdominal portions of the stomach are distended with a large air-fluid level, and the pyloric region and proximal duodenum appear compressed secondary to this altered anatomy, which may suggest associated gastric outlet obstruction. Surgical consultation is recommended. 2. No other acute findings are noted in the abdomen or pelvis on today's examination. 3. Fibroid uterus. Electronically Signed   By: Vinnie Langton M.D.   On: 03/24/2022 06:17    Review of Systems  Constitutional: Negative.   HENT: Negative.    Eyes: Negative.   Respiratory: Negative.    Cardiovascular: Negative.   Gastrointestinal:  Positive for abdominal pain, nausea and vomiting.  Genitourinary: Negative.   Musculoskeletal: Negative.   Skin: Negative.   Neurological: Negative.   Endo/Heme/Allergies: Negative.   Psychiatric/Behavioral: Negative.      PE Blood pressure 129/87, pulse 76, temperature 98.5 F (36.9 C), temperature source Oral, resp. rate 17, height 5\' 3"  (1.6 m), weight 59 kg, SpO2 100 %. Constitutional: NAD; conversant; no deformities Eyes: Moist conjunctiva; no lid lag; anicteric; PERRL Neck: Trachea midline; no thyromegaly Lungs: Normal respiratory effort; no tactile fremitus CV: RRR; no palpable thrills; no pitting edema GI: Abd soft, NT; no palpable hepatosplenomegaly MSK: Normal gait; no clubbing/cyanosis Psychiatric: Appropriate affect; alert and oriented x3 Lymphatic: No palpable cervical or axillary lymphadenopathy Skin: No major subcutaneous nodules. Warm and dry   Assessment/Plan: 72 yo female with giant hiatal hernia with concern of volvulus or obstruction -symptoms improved with vomiting -admit -clear liquids -reassess to decide if additional gastropexy or  reduction is necessary now vs plan for elective repair  I reviewed last 24 h vitals and pain scores, last 48 h intake and output, last 24 h labs and trends, and last 24 h imaging results.  This care required high  level of medical decision making.   Arta Bruce Caresse Sedivy 03/24/2022, 1:53 PM

## 2022-03-25 LAB — BASIC METABOLIC PANEL
Anion gap: 9 (ref 5–15)
BUN: 9 mg/dL (ref 8–23)
CO2: 25 mmol/L (ref 22–32)
Calcium: 9 mg/dL (ref 8.9–10.3)
Chloride: 105 mmol/L (ref 98–111)
Creatinine, Ser: 0.79 mg/dL (ref 0.44–1.00)
GFR, Estimated: >60 mL/min (ref >60)
Glucose, Bld: 152 mg/dL — ABNORMAL HIGH (ref 70–99)
Potassium: 3.7 mmol/L (ref 3.5–5.1)
Sodium: 139 mmol/L (ref 135–145)

## 2022-03-25 LAB — CBC
HCT: 38.9 % (ref 36.0–46.0)
Hemoglobin: 12.5 g/dL (ref 12.0–15.0)
MCH: 28.7 pg (ref 26.0–34.0)
MCHC: 32.1 g/dL (ref 30.0–36.0)
MCV: 89.2 fL (ref 80.0–100.0)
Platelets: 168 10*3/uL (ref 150–400)
RBC: 4.36 MIL/uL (ref 3.87–5.11)
RDW: 13.9 % (ref 11.5–15.5)
WBC: 7.2 10*3/uL (ref 4.0–10.5)
nRBC: 0 % (ref 0.0–0.2)

## 2022-03-25 MED ORDER — BOOST / RESOURCE BREEZE PO LIQD CUSTOM
1.0000 | Freq: Three times a day (TID) | ORAL | Status: DC
  Administered 2022-03-25: 1 via ORAL

## 2022-03-25 NOTE — Progress Notes (Signed)
Subjective/Chief Complaint: One episode of emesis last night. Still with intermittent epigastric pain.    Objective: Vital signs in last 24 hours: Temp:  [98.1 F (36.7 C)-98.7 F (37.1 C)] 98.4 F (36.9 C) (01/02 0556) Pulse Rate:  [64-93] 66 (01/02 0556) Resp:  [16-19] 17 (01/02 0556) BP: (115-133)/(80-99) 118/83 (01/02 0556) SpO2:  [99 %-100 %] 100 % (01/02 0556) Last BM Date : 03/24/22  Intake/Output from previous day: 01/01 0701 - 01/02 0700 In: 453.1 [I.V.:453.1] Out: 1 [Urine:1] Intake/Output this shift: No intake/output data recorded.  A&Ox3 Unlabored respirations Abdomen soft, nondistended, mildly tender epigastrium  Lab Results:  Recent Labs    03/24/22 0456 03/25/22 0309  WBC 5.9 7.2  HGB 13.9 12.5  HCT 43.2 38.9  PLT 231 168   BMET Recent Labs    03/24/22 0456 03/25/22 0309  NA 139 139  K 2.9* 3.7  CL 103 105  CO2 23 25  GLUCOSE 257* 152*  BUN 11 9  CREATININE 1.00 0.79  CALCIUM 9.3 9.0   PT/INR No results for input(s): "LABPROT", "INR" in the last 72 hours. ABG No results for input(s): "PHART", "HCO3" in the last 72 hours.  Invalid input(s): "PCO2", "PO2"  Studies/Results: CT ABDOMEN PELVIS W CONTRAST  Result Date: 03/24/2022 CLINICAL DATA:  72 year old female with history of acute onset of nonlocalized abdominal pain. Vomiting. EXAM: CT ABDOMEN AND PELVIS WITH CONTRAST TECHNIQUE: Multidetector CT imaging of the abdomen and pelvis was performed using the standard protocol following bolus administration of intravenous contrast. RADIATION DOSE REDUCTION: This exam was performed according to the departmental dose-optimization program which includes automated exposure control, adjustment of the mA and/or kV according to patient size and/or use of iterative reconstruction technique. CONTRAST:  46mL OMNIPAQUE IOHEXOL 350 MG/ML SOLN COMPARISON:  CT of the abdomen and pelvis 06/16/2021. FINDINGS: Lower chest: Large hiatal hernia partially imaged,  with greater than 50% of the stomach intrathoracic in position. Hepatobiliary: 1.4 cm low-attenuation lesion in segment 8 of the liver, compatible with a simple cyst (no imaging follow-up recommended). No other suspicious appearing hepatic lesions. No intra or extrahepatic biliary ductal dilatation. Gallbladder is unremarkable in appearance Pancreas: No pancreatic mass. No pancreatic ductal dilatation. No pancreatic or peripancreatic fluid collections or inflammatory changes. Spleen: Unremarkable. Adrenals/Urinary Tract: Bilateral kidneys and adrenal glands are normal in appearance. No hydroureteronephrosis. Urinary bladder is nearly completely decompressed, but otherwise unremarkable in appearance. Stomach/Bowel: Large hiatal hernia with greater than 50% of the stomach in the intrathoracic position. Stomach appears rotated along its long axis (i.e., this appears to represent an organo-axial volvulus), similar to prior studies. Intrathoracic and intra-abdominal portions of the stomach are both distended, and there is a large air-fluid level in the intrathoracic portion of the stomach. Compression and narrowing of the antral pre-pyloric region of the stomach in the proximal duodenum is noted, which may suggest gastric outlet obstruction. No pathologic dilatation of other portions of small bowel or colon. A few scattered colonic diverticula are noted, without surrounding inflammatory changes to indicate an acute diverticulitis at this time. Normal appendix. Vascular/Lymphatic: No significant atherosclerotic disease, aneurysm or dissection noted in the abdominal or pelvic vasculature. Circumaortic left renal vein (normal anatomical variant) incidentally noted. No lymphadenopathy noted in the abdomen or pelvis. Reproductive: Retroverted uterus which is heterogeneous in appearance with multiple lesions, some of which are calcified, largest of which is located in the posterior aspect of the body of the uterus, encroaching  upon the endometrial canal measuring up to 2.2 cm  in diameter (image sagittal image 66 of series 8), most compatible with fibroids. Ovaries are unremarkable in appearance. Other: No significant volume of ascites.  No pneumoperitoneum. Musculoskeletal: There are no aggressive appearing lytic or blastic lesions noted in the visualized portions of the skeleton. IMPRESSION: 1. 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 Intrathoracic and intra-abdominal portions of the stomach are distended with a large air-fluid level, and the pyloric region and proximal duodenum appear compressed secondary to this altered anatomy, which may suggest associated gastric outlet obstruction. Surgical consultation is recommended. 2. No other acute findings are noted in the abdomen or pelvis on today's examination. 3. Fibroid uterus. Electronically Signed   By: Vinnie Langton M.D.   On: 03/24/2022 06:17    Anti-infectives: Anti-infectives (From admission, onward)    None       Assessment/Plan: 72 yo female with giant hiatal hernia with concern of volvulus or obstruction  S/p 07/04/2020: Exploratory laparotomy, reduction of incarcerated paraesophageal hernia, repair of gastric perforation x2, placement of gastrostomy tube and JP drain   Preop diagnosis: Perforated viscus Post-op diagnosis/intraop findings: Incarcerated paraesophageal hernia with gastric volvulus and secondary perforation x 2 on the proximal aspect of the fundus/ greater curvature   Infection present at the time of surgery: Yes, diffuse feculent peritonitis Procedure status: Emergency   -Feeling somewhat better this AM but still with intermittent pain -She will need this re-repaired, which will be exceptionally complex given above history. If symptoms continue to improve will plan elective repair, if not will plan OR this week.    I reviewed last 24 h vitals and pain scores, last 48 h intake and output,  last 24 h labs and trends, and last 24 h imaging results.   This care required high  level of medical decision making.   LOS: 1 day    Clovis Riley 03/25/2022

## 2022-03-26 LAB — CBC
HCT: 39.3 % (ref 36.0–46.0)
Hemoglobin: 13 g/dL (ref 12.0–15.0)
MCH: 29.2 pg (ref 26.0–34.0)
MCHC: 33.1 g/dL (ref 30.0–36.0)
MCV: 88.3 fL (ref 80.0–100.0)
Platelets: 167 10*3/uL (ref 150–400)
RBC: 4.45 MIL/uL (ref 3.87–5.11)
RDW: 13.7 % (ref 11.5–15.5)
WBC: 6 10*3/uL (ref 4.0–10.5)
nRBC: 0 % (ref 0.0–0.2)

## 2022-03-26 LAB — BASIC METABOLIC PANEL
Anion gap: 10 (ref 5–15)
BUN: 10 mg/dL (ref 8–23)
CO2: 25 mmol/L (ref 22–32)
Calcium: 8.8 mg/dL — ABNORMAL LOW (ref 8.9–10.3)
Chloride: 103 mmol/L (ref 98–111)
Creatinine, Ser: 0.81 mg/dL (ref 0.44–1.00)
GFR, Estimated: >60 mL/min (ref >60)
Glucose, Bld: 163 mg/dL — ABNORMAL HIGH (ref 70–99)
Potassium: 3.3 mmol/L — ABNORMAL LOW (ref 3.5–5.1)
Sodium: 138 mmol/L (ref 135–145)

## 2022-03-26 MED ORDER — HYDRALAZINE HCL 20 MG/ML IJ SOLN
5.0000 mg | Freq: Four times a day (QID) | INTRAMUSCULAR | Status: DC
  Administered 2022-03-26 – 2022-03-28 (×10): 5 mg via INTRAVENOUS
  Filled 2022-03-26 (×10): qty 1

## 2022-03-26 MED ORDER — CEFAZOLIN SODIUM-DEXTROSE 2-4 GM/100ML-% IV SOLN
2.0000 g | INTRAVENOUS | Status: AC
  Administered 2022-03-27: 2 g via INTRAVENOUS
  Filled 2022-03-26: qty 100

## 2022-03-26 MED ORDER — LORAZEPAM 2 MG/ML IJ SOLN: 0.5000 mg | Freq: Once | INTRAMUSCULAR | Status: DC

## 2022-03-26 MED ORDER — POTASSIUM CHLORIDE 10 MEQ/100ML IV SOLN
10.0000 meq | INTRAVENOUS | Status: AC
  Administered 2022-03-26 (×4): 10 meq via INTRAVENOUS
  Filled 2022-03-26 (×4): qty 100

## 2022-03-26 NOTE — Plan of Care (Signed)

## 2022-03-26 NOTE — Progress Notes (Addendum)
       Subjective: Continues to have abdominal pain with nausea and vomiting.  Had some flatus yesterday as well, but doesn't feel well.  Objective: Vital signs in last 24 hours: Temp:  [97.7 F (36.5 C)-98.6 F (37 C)] 97.8 F (36.6 C) (01/03 0810) Pulse Rate:  [69-80] 69 (01/03 0810) Resp:  [17-19] 18 (01/03 0810) BP: (116-147)/(87-105) 140/102 (01/03 0810) SpO2:  [99 %-100 %] 99 % (01/03 0810) Last BM Date : 03/25/22  Intake/Output from previous day: 01/02 0701 - 01/03 0700 In: -  Out: 1 [Emesis/NG output:1] Intake/Output this shift: No intake/output data recorded.  PE: Gen: NAD Heart: regular Lungs: CTAB Abd: soft, mild epigastric tenderness, no distention  Lab Results:  Recent Labs    03/25/22 0309 03/26/22 0208  WBC 7.2 6.0  HGB 12.5 13.0  HCT 38.9 39.3  PLT 168 167   BMET Recent Labs    03/25/22 0309 03/26/22 0208  NA 139 138  K 3.7 3.3*  CL 105 103  CO2 25 25  GLUCOSE 152* 163*  BUN 9 10  CREATININE 0.79 0.81  CALCIUM 9.0 8.8*   PT/INR No results for input(s): "LABPROT", "INR" in the last 72 hours. CMP     Component Value Date/Time   NA 138 03/26/2022 0208   NA 142 01/07/2022 0940   K 3.3 (L) 03/26/2022 0208   CL 103 03/26/2022 0208   CO2 25 03/26/2022 0208   GLUCOSE 163 (H) 03/26/2022 0208   BUN 10 03/26/2022 0208   BUN 13 01/07/2022 0940   CREATININE 0.81 03/26/2022 0208   CALCIUM 8.8 (L) 03/26/2022 0208   PROT 6.9 03/24/2022 0456   PROT 7.0 01/07/2022 0940   ALBUMIN 3.8 03/24/2022 0456   ALBUMIN 4.0 01/07/2022 0940   AST 73 (H) 03/24/2022 0456   ALT 67 (H) 03/24/2022 0456   ALKPHOS 85 03/24/2022 0456   BILITOT 0.9 03/24/2022 0456   BILITOT <0.2 01/07/2022 0940   GFRNONAA >60 03/26/2022 0208   GFRAA >60 04/01/2018 2122   Lipase     Component Value Date/Time   LIPASE 27 03/24/2022 0456       Studies/Results: No results found.  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  Given previous surgery, this may be difficult so will get everything appropriately planned and then move forward.  D/w patient today who is in agreement with this plan.  FEN - NPO x ice/NGT/IVFs VTE - Lovenox ID - none currently needed  HTN - hold home norvasc since NPO, schedule 5mg  of hydralazine q 6 hrs for now and lopressor prn Hypokalemia - replace K today  I reviewed last 24 h vitals and pain scores, last 48 h intake and output, last 24 h labs and trends, and last 24 h imaging results.   LOS: 2 days    Henreitta Cea , [facility] 03/26/2022, 8:27 AM Please see Amion for pager number during day hours 7:00am-4:30pm or 7:00am -11:30am on weekends

## 2022-03-26 NOTE — Progress Notes (Signed)
This nurse attempted to insert NG tube, attempt unsuccessful. Second nurse attempted to insert NG tube, attempt was also unsuccessful. Notified physician assistant, per physician assistant she will attempt to insert NG tube at bedside.

## 2022-03-27 ENCOUNTER — Encounter (HOSPITAL_COMMUNITY): Admission: EM | Disposition: A | Discharge status: Home / Self Care | Payer: Self-pay | Source: Home / Self Care

## 2022-03-27 ENCOUNTER — Inpatient Hospital Stay (HOSPITAL_COMMUNITY): Payer: Medicare Other | Admitting: Anesthesiology

## 2022-03-27 ENCOUNTER — Encounter (HOSPITAL_COMMUNITY): Payer: Self-pay

## 2022-03-27 ENCOUNTER — Inpatient Hospital Stay: Payer: Self-pay

## 2022-03-27 ENCOUNTER — Other Ambulatory Visit: Payer: Self-pay

## 2022-03-27 DIAGNOSIS — K440 Diaphragmatic hernia with obstruction, without gangrene: Secondary | ICD-10-CM | POA: Diagnosis not present

## 2022-03-27 DIAGNOSIS — K255 Chronic or unspecified gastric ulcer with perforation: Secondary | ICD-10-CM

## 2022-03-27 DIAGNOSIS — K660 Peritoneal adhesions (postprocedural) (postinfection): Secondary | ICD-10-CM | POA: Diagnosis not present

## 2022-03-27 DIAGNOSIS — R160 Hepatomegaly, not elsewhere classified: Secondary | ICD-10-CM

## 2022-03-27 DIAGNOSIS — K562 Volvulus: Secondary | ICD-10-CM

## 2022-03-27 HISTORY — PX: GASTROSTOMY: SHX5249

## 2022-03-27 HISTORY — PX: LAPAROSCOPIC LYSIS OF ADHESIONS: SHX5905

## 2022-03-27 HISTORY — PX: HIATAL HERNIA REPAIR: SHX195

## 2022-03-27 LAB — BASIC METABOLIC PANEL
Anion gap: 10 (ref 5–15)
BUN: 11 mg/dL (ref 8–23)
CO2: 25 mmol/L (ref 22–32)
Calcium: 9.3 mg/dL (ref 8.9–10.3)
Chloride: 103 mmol/L (ref 98–111)
Creatinine, Ser: 0.82 mg/dL (ref 0.44–1.00)
GFR, Estimated: >60 mL/min (ref >60)
Glucose, Bld: 151 mg/dL — ABNORMAL HIGH (ref 70–99)
Potassium: 3.5 mmol/L (ref 3.5–5.1)
Sodium: 138 mmol/L (ref 135–145)

## 2022-03-27 SURGERY — LYSIS, ADHESIONS, LAPAROSCOPIC
Anesthesia: General

## 2022-03-27 MED ORDER — FENTANYL CITRATE (PF) 250 MCG/5ML IJ SOLN
INTRAMUSCULAR | Status: AC
  Filled 2022-03-27: qty 5

## 2022-03-27 MED ORDER — LIDOCAINE 2% (20 MG/ML) 5 ML SYRINGE
INTRAMUSCULAR | Status: DC | PRN
  Administered 2022-03-27: 40 mg via INTRAVENOUS

## 2022-03-27 MED ORDER — DEXAMETHASONE SODIUM PHOSPHATE 10 MG/ML IJ SOLN
INTRAMUSCULAR | Status: AC
  Filled 2022-03-27: qty 1

## 2022-03-27 MED ORDER — BUPIVACAINE-EPINEPHRINE (PF) 0.5% -1:200000 IJ SOLN
INTRAMUSCULAR | Status: DC | PRN
  Administered 2022-03-27: 18 mL

## 2022-03-27 MED ORDER — FENTANYL CITRATE (PF) 100 MCG/2ML IJ SOLN
INTRAMUSCULAR | Status: AC
  Filled 2022-03-27: qty 2

## 2022-03-27 MED ORDER — DEXAMETHASONE SODIUM PHOSPHATE 10 MG/ML IJ SOLN
INTRAMUSCULAR | Status: DC | PRN
  Administered 2022-03-27: 10 mg via INTRAVENOUS

## 2022-03-27 MED ORDER — ACETAMINOPHEN 10 MG/ML IV SOLN: 1000.0000 mg | Freq: Once | INTRAVENOUS | Status: DC | PRN

## 2022-03-27 MED ORDER — LIDOCAINE 2% (20 MG/ML) 5 ML SYRINGE
INTRAMUSCULAR | Status: AC
  Filled 2022-03-27: qty 5

## 2022-03-27 MED ORDER — CHLORHEXIDINE GLUCONATE 0.12 % MT SOLN
15.0000 mL | Freq: Once | OROMUCOSAL | Status: AC
  Administered 2022-03-27: 15 mL via OROMUCOSAL
  Filled 2022-03-27: qty 15

## 2022-03-27 MED ORDER — ROCURONIUM BROMIDE 10 MG/ML (PF) SYRINGE
PREFILLED_SYRINGE | INTRAVENOUS | Status: DC | PRN
  Administered 2022-03-27: 20 mg via INTRAVENOUS
  Administered 2022-03-27: 10 mg via INTRAVENOUS
  Administered 2022-03-27 (×2): 40 mg via INTRAVENOUS

## 2022-03-27 MED ORDER — HYDROMORPHONE HCL 1 MG/ML IJ SOLN
0.5000 mg | INTRAMUSCULAR | Status: DC | PRN
  Administered 2022-03-27 (×2): 0.5 mg via INTRAVENOUS
  Filled 2022-03-27 (×2): qty 0.5

## 2022-03-27 MED ORDER — DEXTROSE-NACL 5-0.45 % IV SOLN: INTRAVENOUS | Status: AC

## 2022-03-27 MED ORDER — SUGAMMADEX SODIUM 200 MG/2ML IV SOLN
INTRAVENOUS | Status: DC | PRN
  Administered 2022-03-27: 150 mg via INTRAVENOUS

## 2022-03-27 MED ORDER — DIPHENHYDRAMINE HCL 50 MG/ML IJ SOLN
INTRAMUSCULAR | Status: AC
  Filled 2022-03-27: qty 1

## 2022-03-27 MED ORDER — PHENYLEPHRINE 80 MCG/ML (10ML) SYRINGE FOR IV PUSH (FOR BLOOD PRESSURE SUPPORT)
PREFILLED_SYRINGE | INTRAVENOUS | Status: AC
  Filled 2022-03-27: qty 10

## 2022-03-27 MED ORDER — DIPHENHYDRAMINE HCL 50 MG/ML IJ SOLN
INTRAMUSCULAR | Status: DC | PRN
  Administered 2022-03-27: 12.5 mg via INTRAVENOUS

## 2022-03-27 MED ORDER — PHENYLEPHRINE HCL (PRESSORS) 10 MG/ML IV SOLN
INTRAVENOUS | Status: DC | PRN
  Administered 2022-03-27: 80 ug via INTRAVENOUS
  Administered 2022-03-27: 160 ug via INTRAVENOUS

## 2022-03-27 MED ORDER — ROCURONIUM BROMIDE 10 MG/ML (PF) SYRINGE
PREFILLED_SYRINGE | INTRAVENOUS | Status: AC
  Filled 2022-03-27: qty 10

## 2022-03-27 MED ORDER — SODIUM CHLORIDE 0.9 % IR SOLN
Status: DC | PRN
  Administered 2022-03-27: 1000 mL

## 2022-03-27 MED ORDER — ONDANSETRON HCL 4 MG/2ML IJ SOLN
INTRAMUSCULAR | Status: DC | PRN
  Administered 2022-03-27: 4 mg via INTRAVENOUS

## 2022-03-27 MED ORDER — MIDAZOLAM HCL 2 MG/2ML IJ SOLN
INTRAMUSCULAR | Status: AC
  Filled 2022-03-27: qty 2

## 2022-03-27 MED ORDER — METRONIDAZOLE 500 MG/100ML IV SOLN
500.0000 mg | Freq: Two times a day (BID) | INTRAVENOUS | Status: AC
  Administered 2022-03-27 – 2022-04-01 (×10): 500 mg via INTRAVENOUS
  Filled 2022-03-27 (×10): qty 100

## 2022-03-27 MED ORDER — LACTATED RINGERS IV SOLN: INTRAVENOUS | Status: DC

## 2022-03-27 MED ORDER — SODIUM CHLORIDE 0.9 % IV SOLN
2.0000 g | Freq: Two times a day (BID) | INTRAVENOUS | Status: AC
  Administered 2022-03-27 – 2022-04-01 (×10): 2 g via INTRAVENOUS
  Filled 2022-03-27 (×10): qty 12.5

## 2022-03-27 MED ORDER — ALBUMIN HUMAN 5 % IV SOLN: INTRAVENOUS | Status: DC | PRN

## 2022-03-27 MED ORDER — SUCCINYLCHOLINE CHLORIDE 200 MG/10ML IV SOSY
PREFILLED_SYRINGE | INTRAVENOUS | Status: AC
  Filled 2022-03-27: qty 10

## 2022-03-27 MED ORDER — PHENYLEPHRINE HCL-NACL 20-0.9 MG/250ML-% IV SOLN
INTRAVENOUS | Status: DC | PRN
  Administered 2022-03-27: 40 ug/min via INTRAVENOUS

## 2022-03-27 MED ORDER — SUCCINYLCHOLINE CHLORIDE 200 MG/10ML IV SOSY
PREFILLED_SYRINGE | INTRAVENOUS | Status: DC | PRN
  Administered 2022-03-27: 100 mg via INTRAVENOUS

## 2022-03-27 MED ORDER — PHENYLEPHRINE 80 MCG/ML (10ML) SYRINGE FOR IV PUSH (FOR BLOOD PRESSURE SUPPORT)
PREFILLED_SYRINGE | INTRAVENOUS | Status: DC | PRN
  Administered 2022-03-27 (×2): 120 ug via INTRAVENOUS

## 2022-03-27 MED ORDER — LACTATED RINGERS IV SOLN: INTRAVENOUS | Status: DC | PRN

## 2022-03-27 MED ORDER — ONDANSETRON HCL 4 MG/2ML IJ SOLN
INTRAMUSCULAR | Status: AC
  Filled 2022-03-27: qty 2

## 2022-03-27 MED ORDER — FENTANYL CITRATE (PF) 250 MCG/5ML IJ SOLN
INTRAMUSCULAR | Status: DC | PRN
  Administered 2022-03-27 (×6): 25 ug via INTRAVENOUS
  Administered 2022-03-27: 150 ug via INTRAVENOUS

## 2022-03-27 MED ORDER — PROPOFOL 10 MG/ML IV BOLUS
INTRAVENOUS | Status: DC | PRN
  Administered 2022-03-27: 120 mg via INTRAVENOUS

## 2022-03-27 MED ORDER — METOCLOPRAMIDE HCL 5 MG/ML IJ SOLN
10.0000 mg | Freq: Four times a day (QID) | INTRAMUSCULAR | Status: DC
  Administered 2022-03-27 – 2022-04-10 (×56): 10 mg via INTRAVENOUS
  Filled 2022-03-27 (×57): qty 2

## 2022-03-27 MED ORDER — ORAL CARE MOUTH RINSE: 15.0000 mL | Freq: Once | OROMUCOSAL | Status: AC

## 2022-03-27 MED ORDER — FENTANYL CITRATE (PF) 100 MCG/2ML IJ SOLN
25.0000 ug | INTRAMUSCULAR | Status: DC | PRN
  Administered 2022-03-27: 50 ug via INTRAVENOUS

## 2022-03-27 MED ORDER — PANTOPRAZOLE SODIUM 40 MG IV SOLR
40.0000 mg | Freq: Two times a day (BID) | INTRAVENOUS | Status: DC
  Administered 2022-03-27 – 2022-04-10 (×29): 40 mg via INTRAVENOUS
  Filled 2022-03-27 (×29): qty 10

## 2022-03-27 SURGICAL SUPPLY — 54 items
BLOCK BITE 60FR ADLT L/F BLUE (MISCELLANEOUS) IMPLANT
BUTTON OLYMPUS DEFENDO 5 PIECE (MISCELLANEOUS) IMPLANT
CABLE HIGH FREQUENCY MONO STRZ (ELECTRODE) IMPLANT
CHLORAPREP W/TINT 26 (MISCELLANEOUS) ×1 IMPLANT
DERMABOND ADVANCED .7 DNX6 (GAUZE/BANDAGES/DRESSINGS) IMPLANT
DEVICE SUT QUICK LOAD TK 5 (SUTURE) IMPLANT
DEVICE SUT TI-KNOT TK 5X26 (SUTURE) IMPLANT
DEVICE SUTURE ENDOST 10MM (ENDOMECHANICALS) ×1 IMPLANT
DRAIN PENROSE 0.25X18 (DRAIN) IMPLANT
ELECT REM PT RETURN 9FT ADLT (ELECTROSURGICAL) ×1
ELECTRODE REM PT RTRN 9FT ADLT (ELECTROSURGICAL) ×1 IMPLANT
GLOVE BIOGEL PI IND STRL 6.5 (GLOVE) IMPLANT
GLOVE BIOGEL PI IND STRL 7.0 (GLOVE) IMPLANT
GLOVE SURG SS PI 6.5 STRL IVOR (GLOVE) IMPLANT
GLOVE SURG SS PI 7.0 STRL IVOR (GLOVE) IMPLANT
GOWN STRL REUS W/ TWL LRG LVL3 (GOWN DISPOSABLE) ×3 IMPLANT
GOWN STRL REUS W/TWL LRG LVL3 (GOWN DISPOSABLE) ×4
IRRIG SUCT STRYKERFLOW 2 WTIP (MISCELLANEOUS) ×1
IRRIGATION SUCT STRKRFLW 2 WTP (MISCELLANEOUS) ×1 IMPLANT
KIT BASIN OR (CUSTOM PROCEDURE TRAY) ×1 IMPLANT
KIT CLEAN ENDO COMPLIANCE (KITS) IMPLANT
KIT TURNOVER KIT B (KITS) IMPLANT
NS IRRIG 1000ML POUR BTL (IV SOLUTION) ×1 IMPLANT
RELOAD ENDO STITCH 2.0 (ENDOMECHANICALS) ×2
RELOAD STAPLE 60 3.6 BLU REG (STAPLE) IMPLANT
RELOAD STAPLER BLUE 60MM (STAPLE) ×3 IMPLANT
RELOAD SUT SNGL STCH BLK 2-0 (ENDOMECHANICALS) IMPLANT
SCISSORS LAP 5X45 EPIX DISP (ENDOMECHANICALS) ×1 IMPLANT
SET TUBE SMOKE EVAC HIGH FLOW (TUBING) ×1 IMPLANT
SHEARS HARMONIC ACE PLUS 45CM (MISCELLANEOUS) ×1 IMPLANT
SLEEVE ADV FIXATION 5X100MM (TROCAR) IMPLANT
SLEEVE Z-THREAD 5X100MM (TROCAR) IMPLANT
STAPLE ECHEON FLEX 60 POW ENDO (STAPLE) IMPLANT
STAPLER RELOAD BLUE 60MM (STAPLE) ×3
SUT MNCRL AB 4-0 PS2 18 (SUTURE) ×1 IMPLANT
SUT RELOAD ENDO STITCH 2.0 (ENDOMECHANICALS) ×2
SUT SILK 2 0 SH (SUTURE) IMPLANT
SUT SURGIDAC NAB ES-9 0 48 120 (SUTURE) ×4 IMPLANT
SUTURE RELOAD ENDO STITCH 2.0 (ENDOMECHANICALS) ×2 IMPLANT
SYR 20ML LL LF (SYRINGE) IMPLANT
SYS BAG RETRIEVAL 10MM (BASKET) ×1
SYSTEM BAG RETRIEVAL 10MM (BASKET) IMPLANT
TOWEL GREEN STERILE FF (TOWEL DISPOSABLE) ×1 IMPLANT
TOWEL OR NON WOVEN STRL DISP B (DISPOSABLE) IMPLANT
TRAY FOL W/BAG SLVR 16FR STRL (SET/KITS/TRAYS/PACK) IMPLANT
TRAY FOLEY W/BAG SLVR 16FR LF (SET/KITS/TRAYS/PACK) ×1
TRAY LAPAROSCOPIC MC (CUSTOM PROCEDURE TRAY) ×1 IMPLANT
TROCAR ADV FIXATION 5X100MM (TROCAR) IMPLANT
TROCAR XCEL NON-BLD 5MMX100MML (ENDOMECHANICALS) IMPLANT
TROCAR Z THREAD OPTICAL 12X100 (TROCAR) IMPLANT
TROCAR Z-THREAD OPTICAL 5X100M (TROCAR) IMPLANT
TUBE CONNECTING 20X1/4 (TUBING) IMPLANT
TUBE GASTRO BOLUS 18FR ENFIT (TUBING) IMPLANT
TUBING ENDO SMARTCAP (MISCELLANEOUS) IMPLANT

## 2022-03-27 NOTE — Anesthesia Preprocedure Evaluation (Addendum)
Anesthesia Evaluation  Patient identified by MRN, date of birth, ID band Patient awake    Reviewed: Allergy & Precautions, NPO status , Patient's Chart, lab work & pertinent test results  Airway Mallampati: II  TM Distance: >3 FB Neck ROM: Full    Dental no notable dental hx.    Pulmonary neg pulmonary ROS   Pulmonary exam normal        Cardiovascular hypertension, Pt. on medications  Rhythm:Regular Rate:Normal     Neuro/Psych negative neurological ROS  negative psych ROS   GI/Hepatic Neg liver ROS, hiatal hernia,GERD  ,,  Endo/Other  negative endocrine ROS    Renal/GU negative Renal ROS  negative genitourinary   Musculoskeletal negative musculoskeletal ROS (+)    Abdominal Normal abdominal exam  (+)   Peds  Hematology  (+) Blood dyscrasia, anemia   Anesthesia Other Findings   Reproductive/Obstetrics                             Anesthesia Physical Anesthesia Plan  ASA: 2  Anesthesia Plan: General   Post-op Pain Management:    Induction: Intravenous  PONV Risk Score and Plan: 3 and Ondansetron, Dexamethasone and Treatment may vary due to age or medical condition  Airway Management Planned: Mask and Oral ETT  Additional Equipment: None  Intra-op Plan:   Post-operative Plan: Extubation in OR  Informed Consent: I have reviewed the patients History and Physical, chart, labs and discussed the procedure including the risks, benefits and alternatives for the proposed anesthesia with the patient or authorized representative who has indicated his/her understanding and acceptance.     Dental advisory given  Plan Discussed with: CRNA  Anesthesia Plan Comments: (Lab Results      Component                Value               Date                      WBC                      6.0                 03/26/2022                HGB                      13.0                03/26/2022                 HCT                      39.3                03/26/2022                MCV                      88.3                03/26/2022                PLT                      167  03/26/2022             Lab Results      Component                Value               Date                      NA                       138                 03/27/2022                K                        3.5                 03/27/2022                CO2                      25                  03/27/2022                GLUCOSE                  151 (H)             03/27/2022                BUN                      11                  03/27/2022                CREATININE               0.82                03/27/2022                CALCIUM                  9.3                 03/27/2022                EGFR                     76                  01/07/2022                GFRNONAA                 >60                 03/27/2022           )       Anesthesia Quick Evaluation

## 2022-03-27 NOTE — Progress Notes (Signed)
Nutrition Follow-up  DOCUMENTATION CODES:   Not applicable  INTERVENTION:  - Initiate TPN on 1/5 per pharmacy management.   NUTRITION DIAGNOSIS:   Altered GI function related to inability to eat, altered GI function, vomiting, poor appetite as evidenced by NPO status.  GOAL:   Provide needs based on ASPEN/SCCM guidelines  MONITOR:   Labs, I & O's, Diet advancement  REASON FOR ASSESSMENT:   Malnutrition Screening Tool    ASSESSMENT:   72 y.o. female admits related to abdominal pain and vomiting. PMH includes: anemia, GERD, HTN, inguinal hernia. Pt is currently receiving medical management related to hiatal hernia.  Meds reviewed: reglan, flagyl. Labs reviewed. IVF: D5 1/2 NS @ 125 mL/hr.   RD attempted to see pt on 1/3, however, when RD walked in the room pt was  requesting bag due to emesis. No significant wt loss per record. Pt in OR today. Pt underwent the following:   Laparoscopic lysis of adhesions x 75 minutes Laparoscopic Reduction of incarcerated giant paraesophageal hernia and detorsion of gastric volvulue Laparoscopic Wedge resection, fundus of stomach Laparoscopic primary repair of hiatal hernia Laparoscopic gastrostomy tube insertion Primary repair of umbilical hernia Upper endoscopy  RD discussed poor nutritional status with Surgeon. PEG will be used for venting purposes. Will plan to start TPN on 1/5, per pharmacy management. RD will continue to monitor POC.   NUTRITION - FOCUSED PHYSICAL EXAM:  Unable to assess, however pt appears very malnourished. Will attempt at follow up.   Diet Order:   Diet Order             Diet NPO time specified Except for: Ice Chips, Sips with Meds  Diet effective now                   EDUCATION NEEDS:   Not appropriate for education at this time  Skin:  Skin Assessment: Reviewed RN Assessment  Last BM:  03/25/22  Height:   Ht Readings from Last 1 Encounters:  03/27/22 5\' 3"  (1.6 m)    Weight:   Wt  Readings from Last 1 Encounters:  03/27/22 59 kg    Ideal Body Weight:     BMI:  Body mass index is 23.03 kg/m.  Estimated Nutritional Needs:   Kcal:  4801-6553 kcals  Protein:  85-100 gm  Fluid:  >/= 1.7 L  Thalia Bloodgood, RD, LDN, CNSC.

## 2022-03-27 NOTE — Anesthesia Postprocedure Evaluation (Signed)
Anesthesia Post Note  Patient: Shaylene Paganelli Arias  Procedure(s) Performed: LAPAROSCOPIC LYSIS OF ADHESIONS LAPAROSCOPIC REPAIR OF HIATAL HERNIA INSERTION OF GASTROSTOMY TUBE     Patient location during evaluation: PACU Anesthesia Type: General Level of consciousness: awake and alert Pain management: pain level controlled Vital Signs Assessment: post-procedure vital signs reviewed and stable Respiratory status: spontaneous breathing, nonlabored ventilation, respiratory function stable and patient connected to nasal cannula oxygen Cardiovascular status: blood pressure returned to baseline and stable Postop Assessment: no apparent nausea or vomiting Anesthetic complications: no   No notable events documented.  Last Vitals:  Vitals:   03/27/22 1415 03/27/22 1423  BP: 129/86 (!) 126/92  Pulse: 94 94  Resp: (!) 23 (!) 24  Temp:  (!) 36.2 C  SpO2: 95% 94%    Last Pain:  Vitals:   03/27/22 1423  TempSrc:   PainSc: 4                  Bernyce Brimley P Nasirah Sachs

## 2022-03-27 NOTE — Progress Notes (Signed)
Pharmacy Antibiotic Note  Alyssa Peterson is a 72 y.o. female admitted on 03/24/2022 with gastric outlet obstruction 2/2 hiatal hernia. S/p laparoscopic lysis of adhesions, reduction of hernia, detorsion of gastric volvulue, and wedge resection 1/4- found to have extensive intra-abdominal adhesions and gastric perforation. Pharmacy has been consulted for cefepime/flagyl dosing for intra-abdominal infection.  Plan: Cefepime 2g IV q12 hours Flagyl 500mg  IV q12 hours F/u antibiotic length of therapy, surgical path, renal function   Height: 5\' 3"  (160 cm) Weight: 59 kg (130 lb) IBW/kg (Calculated) : 52.4  Temp (24hrs), Avg:97.8 F (36.6 C), Min:96.5 F (35.8 C), Max:98.7 F (37.1 C)  Recent Labs  Lab 03/24/22 0447 03/24/22 0456 03/25/22 0309 03/26/22 0208 03/27/22 0231  WBC  --  5.9 7.2 6.0  --   CREATININE 0.80 1.00 0.79 0.81 0.82    Estimated Creatinine Clearance: 53.6 mL/min (by C-G formula based on SCr of 0.82 mg/dL).    Allergies  Allergen Reactions   Latex Itching    hands   Penicillins Hives and Rash    Antimicrobials this admission: Cefepime 1/4 >>  Flagyl 1/4 >>   Dose adjustments this admission:  Microbiology results: 1/4 Surgical path:   Thank you for allowing pharmacy to be a part of this patient's care.  Dimple Nanas, PharmD, BCPS 03/27/2022 2:23 PM

## 2022-03-27 NOTE — Transfer of Care (Signed)
Immediate Anesthesia Transfer of Care Note  Patient: Alyssa Peterson  Procedure(s) Performed: LAPAROSCOPIC LYSIS OF ADHESIONS LAPAROSCOPIC REPAIR OF HIATAL HERNIA INSERTION OF GASTROSTOMY TUBE  Patient Location: PACU  Anesthesia Type:General  Level of Consciousness: drowsy  Airway & Oxygen Therapy: Patient Spontanous Breathing and Patient connected to face mask oxygen  Post-op Assessment: Report given to RN, Post -op Vital signs reviewed and stable, and Patient moving all extremities X 4  Post vital signs: Reviewed and stable  Last Vitals:  Vitals Value Taken Time  BP 134/86 03/27/22 1330  Temp 35.8 C 03/27/22 1330  Pulse 94 03/27/22 1334  Resp 26 03/27/22 1334  SpO2 95 % 03/27/22 1334  Vitals shown include unvalidated device data.  Last Pain:  Vitals:   03/27/22 0810  TempSrc: Oral  PainSc: 1          Complications: No notable events documented.

## 2022-03-27 NOTE — Anesthesia Procedure Notes (Signed)
Procedure Name: Intubation Date/Time: 03/27/2022 9:07 AM  Performed by: Heide Scales, CRNAPre-anesthesia Checklist: Patient identified, Emergency Drugs available, Suction available and Patient being monitored Patient Re-evaluated:Patient Re-evaluated prior to induction Oxygen Delivery Method: Circle system utilized Preoxygenation: Pre-oxygenation with 100% oxygen Induction Type: IV induction, Rapid sequence and Cricoid Pressure applied Laryngoscope Size: Mac and 3 Grade View: Grade I Tube type: Oral Tube size: 7.0 mm Number of attempts: 1 Airway Equipment and Method: Stylet and Oral airway Placement Confirmation: ETT inserted through vocal cords under direct vision, positive ETCO2 and breath sounds checked- equal and bilateral Secured at: 22 cm Tube secured with: Tape Dental Injury: Teeth and Oropharynx as per pre-operative assessment

## 2022-03-27 NOTE — Progress Notes (Signed)
Day of Surgery  Subjective: Still with emesis overnight. NG insertion unsuccessful despite multiple attempts.  Objective: Vital signs in last 24 hours: Temp:  [97.4 F (36.3 C)-98.7 F (37.1 C)] 97.9 F (36.6 C) (01/04 0810) Pulse Rate:  [86-95] 88 (01/04 0810) Resp:  [16-20] 16 (01/04 0810) BP: (112-120)/(79-92) 118/85 (01/04 0810) SpO2:  [95 %-99 %] 95 % (01/04 0810) Weight:  [59 kg] 59 kg (01/04 0810) Last BM Date : 03/25/22  Intake/Output from previous day: 01/03 0701 - 01/04 0700 In: 0  Out: 150 [Urine:150] Intake/Output this shift: No intake/output data recorded.  PE: Gen: NAD Heart: regular Lungs: CTAB Abd: soft, mild epigastric tenderness, no distention  Lab Results:  Recent Labs    03/25/22 0309 03/26/22 0208  WBC 7.2 6.0  HGB 12.5 13.0  HCT 38.9 39.3  PLT 168 167    BMET Recent Labs    03/26/22 0208 03/27/22 0231  NA 138 138  K 3.3* 3.5  CL 103 103  CO2 25 25  GLUCOSE 163* 151*  BUN 10 11  CREATININE 0.81 0.82  CALCIUM 8.8* 9.3    PT/INR No results for input(s): "LABPROT", "INR" in the last 72 hours. CMP     Component Value Date/Time   NA 138 03/27/2022 0231   NA 142 01/07/2022 0940   K 3.5 03/27/2022 0231   CL 103 03/27/2022 0231   CO2 25 03/27/2022 0231   GLUCOSE 151 (H) 03/27/2022 0231   BUN 11 03/27/2022 0231   BUN 13 01/07/2022 0940   CREATININE 0.82 03/27/2022 0231   CALCIUM 9.3 03/27/2022 0231   PROT 6.9 03/24/2022 0456   PROT 7.0 01/07/2022 0940   ALBUMIN 3.8 03/24/2022 0456   ALBUMIN 4.0 01/07/2022 0940   AST 73 (H) 03/24/2022 0456   ALT 67 (H) 03/24/2022 0456   ALKPHOS 85 03/24/2022 0456   BILITOT 0.9 03/24/2022 0456   BILITOT <0.2 01/07/2022 0940   GFRNONAA >60 03/27/2022 0231   GFRAA >60 04/01/2018 2122   Lipase     Component Value Date/Time   LIPASE 27 03/24/2022 0456       Studies/Results: No results found.  Anti-infectives: Anti-infectives (From admission, onward)    Start     Dose/Rate  Route Frequency Ordered Stop   03/27/22 0600  [MAR Hold]  ceFAZolin (ANCEF) IVPB 2g/100 mL premix        (MAR Hold since Thu 03/27/2022 at 0746.Hold Reason: Transfer to a Procedural area)   2 g 200 mL/hr over 30 Minutes Intravenous On call to O.R. 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 I had an extensive discussion with the patient and her family yesterday regarding surgical plan, risks, benefits, alternatives as documented. Questions again welcomed and answered. Patient wishes to proceed.   FEN - NPO x IVFs VTE - Lovenox ID - none currently needed  HTN - hold home norvasc since NPO, schedule 5mg  of hydralazine q 6 hrs for now and lopressor prn Hypokalemia - replaced 1/3  I reviewed last 24 h vitals and pain scores, last 48 h intake and output, last 24 h labs and trends, and last 24 h imaging  results.   LOS: 3 days    Clovis Riley , MD [facility] 03/27/2022, 8:26 AM Please see Amion for pager number during day hours 7:00am-4:30pm or 7:00am -11:30am on weekends

## 2022-03-27 NOTE — Progress Notes (Signed)
PHARMACY - TOTAL PARENTERAL NUTRITION CONSULT NOTE   Indication:  gastric outlet obstruction, hiatal hernia, intra-abdominal adhesions, gastric perforation  Patient Measurements: Height: 5\' 3"  (160 cm) Weight: 59 kg (130 lb) IBW/kg (Calculated) : 52.4 TPN AdjBW (KG): 59 Body mass index is 23.03 kg/m.  Assessment: 72 yo female presented w/ abdominal pain and vomiting. Known to have large hiatal hernia and gastric volvulus but new gastric outlet obstruction. S/p laparoscopic lysis of adhesions, repair of hernia, and gastrostomy tube insertion. OR findings significant for extensive intra-abdominal adhesions, gastric perforation, and hepatomegaly. Pharmacy consulted to dose TPN.   Central access: consult placed 1/4 TPN start date: 1/5  RD Assessment: Estimated Needs Total Energy Estimated Needs: 1770-2065 kcals Total Protein Estimated Needs: 85-100 gm Total Fluid Estimated Needs: >/= 1.7 L  Current Nutrition:  NPO and TPN (to start 1/5)  Plan:  TPN to begin 1/5 as consult received after cutoff time of 12pm D5- 1/2NS currently running at 125/hr  Dimple Nanas, PharmD, BCPS 03/27/2022 4:01 PM

## 2022-03-27 NOTE — Plan of Care (Signed)

## 2022-03-27 NOTE — Op Note (Signed)
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  The abdomen was prepped and draped in the usual sterile fashion.  A left subcostal Veress needle was placed and the abdomen was insufflated to 15 mmHg without incident.  Optical entry was then used in the right hemiabdomen to place a 5 mm trocar.  The abdominal cavity was inspected and confirmed to be free of injury from our entry however, and extensive adhesions were noted to the anterior abdominal wall as well as surrounding the hiatus and in the epigastrium.  She was also noted to have a fairly enlarged liver extending down to the level of the umbilicus, as well as an approximately 1.5 cm umbilical hernia.   Under direct visualization, additional trocars were  placed including a 75mm trocar through the umbilical hernia.  A meticulous and tedious dissection ensued to free the abdominal wall of adhesions.  The fundus of the stomach was fused for a broad stretch to the anterior abdominal wall in the epigastrium and despite careful dissection and only thin transparent tissue, once the stomach was freed there was noted to be a defect of the gastric wall which was not unexpected.  Additional adhesions from the pyloric region to the anterior hiatus and surface of the left lobe of the liver were carefully divided until the hiatus could be visualized.  At this point a liver retractor was introduced through a subxiphoid incision in the left lobe of the liver retracted with a Nathanson.  At this point, we proceeded to dissect the hernia sac away from the crura and mediastinum.  The hernia sac was extremely thickened and densely adherent and so began this was a tedious process but ultimately we were able to circumferentially dissect the hernia sac and reduced this into the abdomen, allowing the stomach to be reduced in the abdominal cavity.  The hernia sac was excised with harmonic scalpel and ultimately retrieved in an Endo Catch bag.  We proceeded to place a Penrose around the gastroesophageal junction for aid in retraction and then mediastinal attachments of the esophagus were carefully divided in order to mobilize the esophagus into the abdominal cavity.  The esophagus was quite adherent in the upper mediastinum and so once we had about 2 cm of intra-abdominal length this process was stopped.  At this point, I performed an upper endoscopy.  There was bloody secretions in the oropharynx and upper esophagus from previous NG tube insertion attempts.  The thoracic esophagus was closely inspected and confirmed to be intact  with no full-thickness perforation or injury.  There was a small area on the right lateral aspect of the thoracic esophagus where the wall appeared slightly  thickened and more translucent than the rest of the esophagus however endoluminal inspection confirmed this to be intact and free of injury.  The GE junction was identified and the stomach entered.  Confirmed to be detorsed and the previously noted gastrotomy confirmed with no other evidence of full-thickness injury to the stomach or perforation.  The stomach and esophagus were desufflated and the endoscope was removed.   We proceeded to reapproximate the crura posteriorly with 3 simple interrupted 0 Ethibond secured with tie knots, narrowing the hiatus to approximately 2.5 cm.  The area of perforation along the greater curvature of the fundus of the stomach was excised with serial fires of the blue load 60 mm Echelon stapler.  The wedge gastrectomy was retrieved within an Endo Catch bag at the end of the case and handed off for pathology.  The abdomen was surveyed and hernia repair appeared appropriately intact, the field was hemostatic, and the wedge resection of the stomach appeared well-perfused and hemostatic with an intact staple line.  Gastric contents that had spilled from the gastric perforation were aspirated from throughout the abdomen which was also irrigated and the effluent was clear.  At this juncture an 51 French MIC gastrostomy tube was placed through the left paramedian trocar site.  2 gastropexy sutures of 2-0 silk were placed cephalad and caudad from the planned area of gastrostomy tube insertion, as well as a pursestring of 2-0 silk.  A gastrotomy was made and the G-tube was inserted and the balloon inflated with 8 cc of fluid.  The pursestring was cinched down and tied around this.  The gastropexy sutures were brought through the abdominal wall and transfascial fixation using the PMI device and then tied down, with excellent apposition of the stomach to the abdominal wall.  The flange was stunned and the tube was noted to sit at 2 cm at the skin, the top of the flanges at 3 cm.  The flange  is secured at 3 points with interrupted 2-0 nylon's.  The umbilical hernia which had been used as the 12 mm trocar site was closed primarily with 3 simple interrupted 0 Vicryl's.  Additional local was infiltrated at all the incisions.  The liver retractor was removed under direct visualization.  The abdomen was then desufflated and trocars removed.  The skin incisions were closed with subcuticular Monocryl and Dermabond.  The G-tube will be placed to gravity.  The patient was then awakened, extubated and taken to PACU in stable condition.    All counts were correct at the completion of the case.

## 2022-03-28 ENCOUNTER — Inpatient Hospital Stay (HOSPITAL_COMMUNITY): Payer: Medicare Other

## 2022-03-28 ENCOUNTER — Encounter (HOSPITAL_COMMUNITY): Payer: Self-pay | Admitting: Surgery

## 2022-03-28 LAB — COMPREHENSIVE METABOLIC PANEL
ALT: 45 U/L — ABNORMAL HIGH (ref 0–44)
AST: 40 U/L (ref 15–41)
Albumin: 2.6 g/dL — ABNORMAL LOW (ref 3.5–5.0)
Alkaline Phosphatase: 38 U/L (ref 38–126)
Anion gap: 7 (ref 5–15)
BUN: 14 mg/dL (ref 8–23)
CO2: 19 mmol/L — ABNORMAL LOW (ref 22–32)
Calcium: 8.4 mg/dL — ABNORMAL LOW (ref 8.9–10.3)
Chloride: 107 mmol/L (ref 98–111)
Creatinine, Ser: 0.89 mg/dL (ref 0.44–1.00)
GFR, Estimated: >60 mL/min (ref >60)
Glucose, Bld: 121 mg/dL — ABNORMAL HIGH (ref 70–99)
Potassium: 3.4 mmol/L — ABNORMAL LOW (ref 3.5–5.1)
Sodium: 133 mmol/L — ABNORMAL LOW (ref 135–145)
Total Bilirubin: 1.1 mg/dL (ref 0.3–1.2)
Total Protein: 4.7 g/dL — ABNORMAL LOW (ref 6.5–8.1)

## 2022-03-28 LAB — CBC
HCT: 42.1 % (ref 36.0–46.0)
Hemoglobin: 14 g/dL (ref 12.0–15.0)
MCH: 29 pg (ref 26.0–34.0)
MCHC: 33.3 g/dL (ref 30.0–36.0)
MCV: 87.3 fL (ref 80.0–100.0)
Platelets: 159 10*3/uL (ref 150–400)
RBC: 4.82 MIL/uL (ref 3.87–5.11)
RDW: 13.8 % (ref 11.5–15.5)
WBC: 4.1 10*3/uL (ref 4.0–10.5)
nRBC: 0 % (ref 0.0–0.2)

## 2022-03-28 LAB — MAGNESIUM: Magnesium: 1.2 mg/dL — ABNORMAL LOW (ref 1.7–2.4)

## 2022-03-28 LAB — GLUCOSE, CAPILLARY
Glucose-Capillary: 126 mg/dL — ABNORMAL HIGH (ref 70–99)
Glucose-Capillary: 135 mg/dL — ABNORMAL HIGH (ref 70–99)

## 2022-03-28 LAB — TRIGLYCERIDES: Triglycerides: 21 mg/dL (ref <150)

## 2022-03-28 LAB — PHOSPHORUS: Phosphorus: 2 mg/dL — ABNORMAL LOW (ref 2.5–4.6)

## 2022-03-28 LAB — SURGICAL PATHOLOGY

## 2022-03-28 MED ORDER — POTASSIUM CHLORIDE 10 MEQ/100ML IV SOLN
10.0000 meq | INTRAVENOUS | Status: DC
  Filled 2022-03-28: qty 100

## 2022-03-28 MED ORDER — INSULIN ASPART 100 UNIT/ML IJ SOLN
0.0000 [IU] | Freq: Four times a day (QID) | INTRAMUSCULAR | Status: DC
  Administered 2022-03-29: 2 [IU] via SUBCUTANEOUS
  Administered 2022-03-29 (×2): 1 [IU] via SUBCUTANEOUS
  Administered 2022-03-29: 2 [IU] via SUBCUTANEOUS
  Administered 2022-03-30 (×3): 1 [IU] via SUBCUTANEOUS
  Administered 2022-03-30 – 2022-03-31 (×3): 2 [IU] via SUBCUTANEOUS

## 2022-03-28 MED ORDER — SODIUM CHLORIDE 0.9% FLUSH
10.0000 mL | INTRAVENOUS | Status: DC | PRN
  Administered 2022-04-10: 10 mL

## 2022-03-28 MED ORDER — DEXTROSE-NACL 5-0.45 % IV SOLN: INTRAVENOUS | Status: AC

## 2022-03-28 MED ORDER — MAGNESIUM SULFATE 4 GM/100ML IV SOLN
4.0000 g | Freq: Once | INTRAVENOUS | Status: AC
  Administered 2022-03-28: 4 g via INTRAVENOUS
  Filled 2022-03-28: qty 100

## 2022-03-28 MED ORDER — SODIUM CHLORIDE 0.9% FLUSH
10.0000 mL | Freq: Two times a day (BID) | INTRAVENOUS | Status: DC
  Administered 2022-03-28 – 2022-04-06 (×17): 10 mL
  Administered 2022-04-06: 20 mL
  Administered 2022-04-07 – 2022-04-10 (×5): 10 mL

## 2022-03-28 MED ORDER — CHLORHEXIDINE GLUCONATE CLOTH 2 % EX PADS
6.0000 | MEDICATED_PAD | Freq: Every day | CUTANEOUS | Status: DC
  Administered 2022-03-28 – 2022-04-11 (×14): 6 via TOPICAL

## 2022-03-28 MED ORDER — POTASSIUM PHOSPHATES 15 MMOLE/5ML IV SOLN
30.0000 mmol | Freq: Once | INTRAVENOUS | Status: AC
  Administered 2022-03-28: 30 mmol via INTRAVENOUS
  Filled 2022-03-28 (×2): qty 10

## 2022-03-28 MED ORDER — TRAVASOL 10 % IV SOLN
INTRAVENOUS | Status: AC
  Filled 2022-03-28: qty 518.4

## 2022-03-28 NOTE — Progress Notes (Addendum)
[facility] Progress Note  1 Day Post-Op  Subjective: CC:  Resting comfortably. Reports heart palpitations earlier this morning, without chest pain, that have since resolved. Denies abdominal pain, just soreness. Denies any nausea or emesis. Foley remains in place and G-tube is to gravity.   Objective: Vital signs in last 24 hours: Temp:  [96.5 F (35.8 C)-98.5 F (36.9 C)] 98.3 F (36.8 C) (01/05 0601) Pulse Rate:  [91-108] 108 (01/05 0601) Resp:  [16-24] 16 (01/05 0601) BP: (120-140)/(80-96) 123/85 (01/05 0601) SpO2:  [94 %-98 %] 95 % (01/05 0601) Last BM Date : 03/25/22  Intake/Output from previous day: 01/04 0701 - 01/05 0700 In: 4899 [I.V.:3551.3; IV Piggyback:1347.7] Out: 2740 [Urine:2700; Blood:40] Intake/Output this shift: No intake/output data recorded.  PE: Gen:  Alert, NAD, pleasant and cooperative  Card:  Regular rate and rhythm,no lower extremity edema  Pulm:  Normal effort ORA Abd: Soft, appropriately tender, no significnat distention, laparoscopic incisions c/d/I with overlying skin glue - mild ecchymosis at umbilicus. G tube to gravity - bag appears empty. Foley in place with clear, dark yellow urine.  Skin: warm and dry, no rashes  Psych: A&Ox3   Lab Results:  Recent Labs    03/26/22 0208  WBC 6.0  HGB 13.0  HCT 39.3  PLT 167   BMET Recent Labs    03/27/22 0231 03/28/22 0413  NA 138 133*  K 3.5 3.4*  CL 103 107  CO2 25 19*  GLUCOSE 151* 121*  BUN 11 14  CREATININE 0.82 0.89  CALCIUM 9.3 8.4*   PT/INR No results for input(s): "LABPROT", "INR" in the last 72 hours. CMP     Component Value Date/Time   NA 133 (L) 03/28/2022 0413   NA 142 01/07/2022 0940   K 3.4 (L) 03/28/2022 0413   CL 107 03/28/2022 0413   CO2 19 (L) 03/28/2022 0413   GLUCOSE 121 (H) 03/28/2022 0413   BUN 14 03/28/2022 0413   BUN 13 01/07/2022 0940   CREATININE 0.89 03/28/2022 0413   CALCIUM 8.4 (L) 03/28/2022 0413   PROT 4.7 (L) 03/28/2022 0413    PROT 7.0 01/07/2022 0940   ALBUMIN 2.6 (L) 03/28/2022 0413   ALBUMIN 4.0 01/07/2022 0940   AST 40 03/28/2022 0413   ALT 45 (H) 03/28/2022 0413   ALKPHOS 38 03/28/2022 0413   BILITOT 1.1 03/28/2022 0413   BILITOT <0.2 01/07/2022 0940   GFRNONAA >60 03/28/2022 0413   GFRAA >60 04/01/2018 2122   Lipase     Component Value Date/Time   LIPASE 27 03/24/2022 0456       Studies/Results: [place] EKG SITE RITE  Result Date: 03/27/2022 If Site Rite image not attached, placement could not be confirmed due to current cardiac rhythm.   Anti-infectives: Anti-infectives (From admission, onward)    Start     Dose/Rate Route Frequency Ordered Stop   03/27/22 1600  ceFEPIme (MAXIPIME) 2 g in sodium chloride 0.9 % 100 mL IVPB        2 g 200 mL/hr over 30 Minutes Intravenous Every 12 hours 03/27/22 1501     03/27/22 1600  metroNIDAZOLE (FLAGYL) IVPB 500 mg        500 mg 100 mL/hr over 60 Minutes Intravenous Every 12 hours 03/27/22 1501     03/27/22 0600  ceFAZolin (ANCEF) IVPB 2g/100 mL premix        2 g 200 mL/hr over 30 Minutes Intravenous On call to O.R. 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 Kae Heller Laparoscopic lysis of adhesions x 75 minutes Laparoscopic Reduction of incarcerated giant paraesophageal hernia and detorsion of gastric volvulue Laparoscopic Wedge resection, fundus of stomach Laparoscopic primary repair of hiatal hernia Laparoscopic gastrostomy tube insertion Primary repair of umbilical hernia Upper endoscopy   - afebrile this AM, sinus tachycardia to 108 bpm, normotensive  - hgb 14 today from 13 yesterday, stable   FEN: sips with meds, ice chips, G-tube to gravity for today - ok to clamp temporarily for PO meds. Place PICC/start TPN today, monitor electrolytes   Hypokalemia - 3.4, give 40 mEq IV KCl Hypomagnesemia -  1.2, give 4 g Mg Sulfate   Hypophosphatemia - 2.0, monitor after TPN, may also need K phos  ID: cefepime/flagyl 1/4 >> continue for 5 days post-op due to spillage of gastric contents during surgery  VTE: SCD's, Lovenox Foley: D/C today, POD#1  Dispo: med-surg, tx to 6N surgical floor, await bowel function and plan for slow advancement of diet, will consider clamping G tube tomorrow AM.   HTN - on norvasc at home, hold for now, PRN IV metoprolol  GERD - PPI    LOS: 4 days   I reviewed nursing notes, last 24 h vitals and pain scores, last 48 h intake and output, last 24 h labs and trends, and last 24 h imaging results.   Obie Dredge, PA-C [facility] Please see Amion for pager number during day hours 7:00am-4:30pm

## 2022-03-28 NOTE — Progress Notes (Signed)
PHARMACY - TOTAL PARENTERAL NUTRITION CONSULT NOTE   Indication: Gastric outlet obstruction, hiatal hernia, intra-abdominal adhesions, gastric perforation  Patient Measurements: Height: 5\' 3"  (160 cm) Weight: 59 kg (130 lb) IBW/kg (Calculated) : 52.4 TPN AdjBW (KG): 59 Body mass index is 23.03 kg/m. Usual Weight: 60 kg  Assessment: 72 yo female presented w/ abdominal pain and vomiting. Known to have large hiatal hernia and gastric volvulus but new gastric outlet obstruction. S/p laparoscopic lysis of adhesions, repair of hernia, and gastrostomy tube insertion. OR findings significant for extensive intra-abdominal adhesions, gastric perforation, and hepatomegaly. Pharmacy consulted to dose TPN.  Glucose / Insulin: no hx DM, [name] 5.9, CBG 120-150 Electrolytes: Na 133, K 3.4, CO2 19, CoCa 9.5, Phos 2.0, Mg 1.2 Renal: Scr 0.89, BUN WNL Hepatic: LFTs WNL, albumin 2.6, tbili 1.1 Intake / Output; MIVF: UOP 1.9 mL/kg/hr, G-tube 0 mL, LBM 1/2, MIVF D5/0.45% NS @125  GI Imaging: none since TPN start GI Surgeries / Procedures: none since TPN start  Central access: 03/28/21 TPN start date: 03/28/21  Nutritional Goals: Goal TPN rate is 70 mL/hr (provides 97 g of protein and 1798 kcals per day)  RD Assessment: Estimated Needs Total Energy Estimated Needs: 1770-2065 kcals Total Protein Estimated Needs: 85-100 gm Total Fluid Estimated Needs: >/= 1.7 L  Current Nutrition:  NPO and TPN  Plan:  Start TPN at half goal rate of 40 mL/hr at 1800 Electrolytes in TPN: Na 72mEq/L, K 65mEq/L, Ca 1mEq/L, Mg 72mEq/L, and Phos 54mmol/L. Cl:Ac 1:1 Mg 4g IV once, Kphos 30 mmol IV once Add standard MVI and trace elements to TPN Initiate Sensitive q6h SSI and adjust as needed  Reduce MIVF to 85 mL/hr at 1800 Monitor TPN labs daily until stable then on Mon/Thurs  Merrilee Jansky, PharmD Clinical Pharmacist 03/28/2022,8:43 AM

## 2022-03-28 NOTE — Plan of Care (Signed)

## 2022-03-28 NOTE — Progress Notes (Signed)
Peripherally Inserted Central Catheter Placement  The IV Nurse has discussed with the patient and/or persons authorized to consent for the patient, the purpose of this procedure and the potential benefits and risks involved with this procedure.  The benefits include less needle sticks, lab draws from the catheter, and the patient may be discharged home with the catheter. Risks include, but not limited to, infection, bleeding, blood clot (thrombus formation), and puncture of an artery; nerve damage and irregular heartbeat and possibility to perform a PICC exchange if needed/ordered by physician.  Alternatives to this procedure were also discussed.  Bard Power PICC patient education guide, fact sheet on infection prevention and patient information card has been provided to patient /or left at bedside.    PICC Placement Documentation  PICC Double Lumen 03/28/22 Right Brachial 35 cm 2 cm (Active)  Indication for Insertion or Continuance of Line Administration of hyperosmolar/irritating solutions (i.e. TPN, Vancomycin, etc.) 03/28/22 0900  Exposed Catheter (cm) 2 cm 03/28/22 0900  Site Assessment Clean, Dry, Intact 03/28/22 0900  Lumen #1 Status Flushed;Saline locked;Blood return noted 03/28/22 0900  Lumen #2 Status Flushed;Saline locked;Blood return noted 03/28/22 0900  Dressing Type Transparent;Securing device 03/28/22 0900  Dressing Status Antimicrobial disc in place;Clean, Dry, Intact 03/28/22 0900  Safety Lock Not Applicable 03/50/09 3818  Line Care Connections checked and tightened 03/28/22 0900  Dressing Intervention New dressing 03/28/22 0900  Dressing Change Due 04/04/22 03/28/22 0900       Holley Bouche Renee 03/28/2022, 9:11 AM

## 2022-03-29 LAB — BASIC METABOLIC PANEL
Anion gap: 4 — ABNORMAL LOW (ref 5–15)
BUN: 12 mg/dL (ref 8–23)
CO2: 22 mmol/L (ref 22–32)
Calcium: 8.4 mg/dL — ABNORMAL LOW (ref 8.9–10.3)
Chloride: 108 mmol/L (ref 98–111)
Creatinine, Ser: 0.79 mg/dL (ref 0.44–1.00)
GFR, Estimated: >60 mL/min (ref >60)
Glucose, Bld: 127 mg/dL — ABNORMAL HIGH (ref 70–99)
Potassium: 3.4 mmol/L — ABNORMAL LOW (ref 3.5–5.1)
Sodium: 134 mmol/L — ABNORMAL LOW (ref 135–145)

## 2022-03-29 LAB — CBC
HCT: 32.4 % — ABNORMAL LOW (ref 36.0–46.0)
Hemoglobin: 11.1 g/dL — ABNORMAL LOW (ref 12.0–15.0)
MCH: 29.3 pg (ref 26.0–34.0)
MCHC: 34.3 g/dL (ref 30.0–36.0)
MCV: 85.5 fL (ref 80.0–100.0)
Platelets: 118 10*3/uL — ABNORMAL LOW (ref 150–400)
RBC: 3.79 MIL/uL — ABNORMAL LOW (ref 3.87–5.11)
RDW: 13.9 % (ref 11.5–15.5)
WBC: 7.2 10*3/uL (ref 4.0–10.5)
nRBC: 0 % (ref 0.0–0.2)

## 2022-03-29 LAB — GLUCOSE, CAPILLARY
Glucose-Capillary: 122 mg/dL — ABNORMAL HIGH (ref 70–99)
Glucose-Capillary: 131 mg/dL — ABNORMAL HIGH (ref 70–99)
Glucose-Capillary: 136 mg/dL — ABNORMAL HIGH (ref 70–99)
Glucose-Capillary: 138 mg/dL — ABNORMAL HIGH (ref 70–99)
Glucose-Capillary: 152 mg/dL — ABNORMAL HIGH (ref 70–99)
Glucose-Capillary: 155 mg/dL — ABNORMAL HIGH (ref 70–99)

## 2022-03-29 LAB — PHOSPHORUS: Phosphorus: 2 mg/dL — ABNORMAL LOW (ref 2.5–4.6)

## 2022-03-29 LAB — MAGNESIUM: Magnesium: 2.1 mg/dL (ref 1.7–2.4)

## 2022-03-29 MED ORDER — POTASSIUM CHLORIDE 10 MEQ/50ML IV SOLN
10.0000 meq | INTRAVENOUS | Status: AC
  Administered 2022-03-29 (×2): 10 meq via INTRAVENOUS
  Filled 2022-03-29 (×2): qty 50

## 2022-03-29 MED ORDER — POTASSIUM PHOSPHATES 15 MMOLE/5ML IV SOLN
30.0000 mmol | Freq: Once | INTRAVENOUS | Status: AC
  Administered 2022-03-29: 30 mmol via INTRAVENOUS
  Filled 2022-03-29: qty 10

## 2022-03-29 MED ORDER — ALBUTEROL SULFATE (2.5 MG/3ML) 0.083% IN NEBU: 2.5000 mg | INHALATION_SOLUTION | Freq: Four times a day (QID) | RESPIRATORY_TRACT | Status: DC | PRN

## 2022-03-29 MED ORDER — TRAVASOL 10 % IV SOLN
INTRAVENOUS | Status: AC
  Filled 2022-03-29: qty 518.4

## 2022-03-29 MED ORDER — FUROSEMIDE 10 MG/ML IJ SOLN
20.0000 mg | Freq: Once | INTRAMUSCULAR | Status: AC
  Administered 2022-03-29: 20 mg via INTRAVENOUS
  Filled 2022-03-29: qty 2

## 2022-03-29 MED ORDER — ALBUTEROL SULFATE (2.5 MG/3ML) 0.083% IN NEBU
2.5000 mg | INHALATION_SOLUTION | Freq: Once | RESPIRATORY_TRACT | Status: AC
  Administered 2022-03-29: 2.5 mg via RESPIRATORY_TRACT
  Filled 2022-03-29: qty 3

## 2022-03-29 NOTE — Progress Notes (Signed)
2 Days Post-Op   Subjective/Chief Complaint: Patient with some SOB/ wheezing this morning CXR - probable pleural fluid in previous [place] space Patient denies any nausea or significant abdominal pain No flatus or BM TPN initiated via PICC   Objective: Vital signs in last 24 hours: Temp:  [97.9 F (36.6 C)-98.7 F (37.1 C)] 98.6 F (37 C) (01/06 0809) Pulse Rate:  [99-109] 99 (01/06 0809) Resp:  [16-22] 18 (01/06 0809) BP: (101-153)/(67-88) 153/81 (01/06 0809) SpO2:  [94 %-95 %] 95 % (01/06 0809) Last BM Date : 03/26/22  Intake/Output from previous day: 01/05 0701 - 01/06 0700 In: 1224.5 [I.V.:803.6; IV Piggyback:420.9] Out: 401 [Urine:401] Intake/Output this shift: No intake/output data recorded.  WDWN in NAD Resp - inspiratory wheeze; mildly increased respiratory effort Abd - soft, minimal tenderness Incisions c/d/I G-tube to gravity - minimal output  Lab Results:  Recent Labs    03/28/22 0413 03/29/22 0300  WBC 4.1 7.2  HGB 14.0 11.1*  HCT 42.1 32.4*  PLT 159 118*   BMET Recent Labs    03/28/22 0413 03/29/22 0300  NA 133* 134*  K 3.4* 3.4*  CL 107 108  CO2 19* 22  GLUCOSE 121* 127*  BUN 14 12  CREATININE 0.89 0.79  CALCIUM 8.4* 8.4*   PT/INR No results for input(s): "LABPROT", "INR" in the last 72 hours. ABG No results for input(s): "PHART", "HCO3" in the last 72 hours.  Invalid input(s): "PCO2", "PO2"  Studies/Results: DG CHEST PORT 1 VIEW  Result Date: 03/29/2022 CLINICAL DATA:  Shortness of breath. Laparoscopic repair of hiatal hernia. EXAM: PORTABLE CHEST 1 VIEW COMPARISON:  CT abdomen and pelvis 03/24/2022 FINDINGS: There is opacification in the inferior right hemithorax which may be related to combination of pleural fluid, atelectasis/airspace disease or residual hernia. The left lung is clear. There is no pneumothorax or mediastinal shift. The cardiomediastinal silhouette is within normal limits. Right-sided central venous catheter tip  projects over the SVC. No acute fractures are seen. IMPRESSION: Opacification in the inferior right hemithorax may be related to combination of pleural fluid, atelectasis/airspace disease or residual hernia. No pneumothorax or mediastinal shift. Electronically Signed   By: Ronney Asters M.D.   On: 03/29/2022 00:00   [place] EKG SITE RITE  Result Date: 03/27/2022 If Site Rite image not attached, placement could not be confirmed due to current cardiac rhythm.   Anti-infectives: Anti-infectives (From admission, onward)    Start     Dose/Rate Route Frequency Ordered Stop   03/27/22 1600  ceFEPIme (MAXIPIME) 2 g in sodium chloride 0.9 % 100 mL IVPB        2 g 200 mL/hr over 30 Minutes Intravenous Every 12 hours 03/27/22 1501     03/27/22 1600  metroNIDAZOLE (FLAGYL) IVPB 500 mg        500 mg 100 mL/hr over 60 Minutes Intravenous Every 12 hours 03/27/22 1501     03/27/22 0600  ceFAZolin (ANCEF) IVPB 2g/100 mL premix        2 g 200 mL/hr over 30 Minutes Intravenous On call to O.R. 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 Kae Heller Laparoscopic lysis of adhesions x 75 minutes Laparoscopic Reduction of incarcerated giant paraesophageal hernia and detorsion of gastric volvulue Laparoscopic Wedge resection, fundus of stomach Laparoscopic primary repair of hiatal hernia Laparoscopic gastrostomy tube insertion Primary repair of umbilical hernia Upper endoscopy    Wheezing - decrease IV  fluids, PRN albuterol nebs, consider diuresis FEN: sips with meds, ice chips, G-tube to gravity for today - ok to clamp temporarily for PO meds. TPN   ID: cefepime/flagyl 1/4 >> continue for 5 days post-op due to spillage of gastric contents during surgery  VTE: SCD's, Lovenox Foley: D/C on POD #1 - [place] Dispo: med-surg, tx to 6N surgical floor, await bowel function and plan for slow advancement of diet,  G-tube to gravity for now   HTN - on norvasc at home, hold for now, PRN IV metoprolol  GERD - PPI     LOS: 5 days    Alyssa Peterson 03/29/2022

## 2022-03-29 NOTE — Progress Notes (Signed)
Patient c/o SOB and chest tightness. Reached out to on call MD. Obtained new orders for CXR  Portable, results pending, patient is also being transferred to 6N surgical progressive Unit.

## 2022-03-29 NOTE — Progress Notes (Signed)
Patient c/o SOB and says it started yesterday after receiving hydralazine. Charge nurse notified and on call MD paged. Awaiting new orders for possible breathing treatment. Patient currently on 3L of oxygen. Scheduled 0300 hydralazine refused by patient.

## 2022-03-29 NOTE — Progress Notes (Addendum)
PHARMACY - TOTAL PARENTERAL NUTRITION CONSULT NOTE   Indication: Gastric outlet obstruction, hiatal hernia, intra-abdominal adhesions, gastric perforation  Patient Measurements: Height: 5\' 3"  (160 cm) Weight: 59 kg (130 lb) IBW/kg (Calculated) : 52.4 TPN AdjBW (KG): 59 Body mass index is 23.03 kg/m. Usual Weight: 60 kg  Assessment: 72 yo female presented w/ abdominal pain and vomiting. Known to have large hiatal hernia and gastric volvulus but new gastric outlet obstruction. S/p laparoscopic lysis of adhesions, repair of hernia, and gastrostomy tube insertion. OR findings significant for extensive intra-abdominal adhesions, gastric perforation, and hepatomegaly. Pharmacy consulted to dose TPN.  Glucose / Insulin: no hx DM, [name] 5.9, CBG 120-154 - 4units SSI Electrolytes: Na 134, K 3.4, CO2 22, CoCa 9.5, Phos 2.0, Mg 2.1 Renal: Scr 0.89, BUN WNL Hepatic: LFTs WNL, albumin 2.6, tbili 1.1 Intake / Output; MIVF: UOP 1.9 mL/kg/hr, G-tube 0 mL, LBM 1/2, MIVF D5/0.45% NS @125  GI Imaging: none since TPN start GI Surgeries / Procedures: none since TPN start  Central access: 03/28/21 TPN start date: 03/28/21  Nutritional Goals: Goal TPN rate is 70 mL/hr (provides 97 g of protein and 1798 kcals per day)  RD Assessment: Estimated Needs Total Energy Estimated Needs: 1770-2065 kcals Total Protein Estimated Needs: 85-100 gm Total Fluid Estimated Needs: >/= 1.7 L  Current Nutrition:  NPO and TPN  Plan:  Start TPN at half goal rate of 40 mL/hr at 1800 - will keep at half rate until electrolyte disturbances improve (likely from refeeding) Electrolytes in TPN: Na 59mEq/L, Incr K 65 mEq/L, Ca 67mEq/L, Mg 47mEq/L, and Incr Phos 79mmol/L. Cl:Ac 1:1 Kphos 30 mmol IV once Kcl 10 meq IV x 2 Add standard MVI and trace elements to TPN Initiate Sensitive q6h SSI and adjust as needed  Continue MIVF at 85 mL/hr at 1800 Monitor TPN labs daily until stable then on Mon/Thurs  Corinda Gubler,  PharmD Clinical Pharmacist 03/29/2022,7:47 AM

## 2022-03-30 LAB — BASIC METABOLIC PANEL
Anion gap: 6 (ref 5–15)
Anion gap: 6 (ref 5–15)
BUN: 11 mg/dL (ref 8–23)
BUN: 12 mg/dL (ref 8–23)
CO2: 26 mmol/L (ref 22–32)
CO2: 26 mmol/L (ref 22–32)
Calcium: 8.4 mg/dL — ABNORMAL LOW (ref 8.9–10.3)
Calcium: 8.4 mg/dL — ABNORMAL LOW (ref 8.9–10.3)
Chloride: 105 mmol/L (ref 98–111)
Chloride: 106 mmol/L (ref 98–111)
Creatinine, Ser: 0.68 mg/dL (ref 0.44–1.00)
Creatinine, Ser: 0.61 mg/dL (ref 0.44–1.00)
GFR, Estimated: >60 mL/min (ref >60)
GFR, Estimated: >60 mL/min (ref >60)
Glucose, Bld: 124 mg/dL — ABNORMAL HIGH (ref 70–99)
Glucose, Bld: 139 mg/dL — ABNORMAL HIGH (ref 70–99)
Potassium: 3.3 mmol/L — ABNORMAL LOW (ref 3.5–5.1)
Potassium: 3.7 mmol/L (ref 3.5–5.1)
Sodium: 137 mmol/L (ref 135–145)
Sodium: 138 mmol/L (ref 135–145)

## 2022-03-30 LAB — PHOSPHORUS
Phosphorus: 2.1 mg/dL — ABNORMAL LOW (ref 2.5–4.6)
Phosphorus: 2.6 mg/dL (ref 2.5–4.6)

## 2022-03-30 LAB — GLUCOSE, CAPILLARY
Glucose-Capillary: 139 mg/dL — ABNORMAL HIGH (ref 70–99)
Glucose-Capillary: 151 mg/dL — ABNORMAL HIGH (ref 70–99)
Glucose-Capillary: 138 mg/dL — ABNORMAL HIGH (ref 70–99)
Glucose-Capillary: 101 mg/dL — ABNORMAL HIGH (ref 70–99)

## 2022-03-30 LAB — MAGNESIUM
Magnesium: 1.8 mg/dL (ref 1.7–2.4)
Magnesium: 2.2 mg/dL (ref 1.7–2.4)

## 2022-03-30 MED ORDER — POTASSIUM PHOSPHATES 15 MMOLE/5ML IV SOLN
30.0000 mmol | Freq: Once | INTRAVENOUS | Status: AC
  Administered 2022-03-30: 30 mmol via INTRAVENOUS
  Filled 2022-03-30: qty 10

## 2022-03-30 MED ORDER — TRAVASOL 10 % IV SOLN
INTRAVENOUS | Status: AC
  Filled 2022-03-30: qty 518.4

## 2022-03-30 MED ORDER — MAGNESIUM SULFATE 2 GM/50ML IV SOLN
2.0000 g | Freq: Once | INTRAVENOUS | Status: AC
  Administered 2022-03-30: 2 g via INTRAVENOUS
  Filled 2022-03-30: qty 50

## 2022-03-30 MED ORDER — POTASSIUM PHOSPHATES 15 MMOLE/5ML IV SOLN
30.0000 mmol | Freq: Once | INTRAVENOUS | Status: DC
  Filled 2022-03-30: qty 10

## 2022-03-30 MED ORDER — POTASSIUM CHLORIDE 10 MEQ/50ML IV SOLN
10.0000 meq | INTRAVENOUS | Status: AC
  Administered 2022-03-30 (×4): 10 meq via INTRAVENOUS
  Filled 2022-03-30 (×4): qty 50

## 2022-03-30 NOTE — Progress Notes (Signed)
3 Days Post-Op   Subjective/Chief Complaint: Respiratory status much improved Large response to Lasix yesterday No further wheezing or SOB Patient reports some flatus States that she feels "great" TPN via PICC   Objective: Vital signs in last 24 hours: Temp:  [98.2 F (36.8 C)-98.7 F (37.1 C)] 98.7 F (37.1 C) (01/07 0459) Pulse Rate:  [91-104] 98 (01/07 0459) Resp:  [17-20] 18 (01/07 0459) BP: (109-132)/(75-82) 132/82 (01/07 0459) SpO2:  [94 %-99 %] 94 % (01/07 0459) Last BM Date : 03/26/22  Intake/Output from previous day: 01/06 0701 - 01/07 0700 In: 2056 [I.V.:1424.9; IV Piggyback:631.2] Out: 2490 [Urine:2450; Drains:40] Intake/Output this shift: No intake/output data recorded.  WDWN in NAD Resp - inspiratory wheeze; mildly increased respiratory effort Abd - soft, minimal tenderness Incisions c/d/I G-tube to gravity - minimal output  Lab Results:  Recent Labs    03/28/22 0413 03/29/22 0300  WBC 4.1 7.2  HGB 14.0 11.1*  HCT 42.1 32.4*  PLT 159 118*   BMET Recent Labs    03/29/22 0300 03/30/22 0332  NA 134* 137  K 3.4* 3.3*  CL 108 105  CO2 22 26  GLUCOSE 127* 124*  BUN 12 11  CREATININE 0.79 0.68  CALCIUM 8.4* 8.4*   PT/INR No results for input(s): "LABPROT", "INR" in the last 72 hours. ABG No results for input(s): "PHART", "HCO3" in the last 72 hours.  Invalid input(s): "PCO2", "PO2"  Studies/Results: DG CHEST PORT 1 VIEW  Result Date: 03/29/2022 CLINICAL DATA:  Shortness of breath. Laparoscopic repair of hiatal hernia. EXAM: PORTABLE CHEST 1 VIEW COMPARISON:  CT abdomen and pelvis 03/24/2022 FINDINGS: There is opacification in the inferior right hemithorax which may be related to combination of pleural fluid, atelectasis/airspace disease or residual hernia. The left lung is clear. There is no pneumothorax or mediastinal shift. The cardiomediastinal silhouette is within normal limits. Right-sided central venous catheter tip projects over the  SVC. No acute fractures are seen. IMPRESSION: Opacification in the inferior right hemithorax may be related to combination of pleural fluid, atelectasis/airspace disease or residual hernia. No pneumothorax or mediastinal shift. Electronically Signed   By: Darliss Cheney M.D.   On: 03/29/2022 00:00    Anti-infectives: Anti-infectives (From admission, onward)    Start     Dose/Rate Route Frequency Ordered Stop   03/27/22 1600  ceFEPIme (MAXIPIME) 2 g in sodium chloride 0.9 % 100 mL IVPB        2 g 200 mL/hr over 30 Minutes Intravenous Every 12 hours 03/27/22 1501 04/01/22 1559   03/27/22 1600  metroNIDAZOLE (FLAGYL) IVPB 500 mg        500 mg 100 mL/hr over 60 Minutes Intravenous Every 12 hours 03/27/22 1501 04/01/22 1559   03/27/22 0600  ceFAZolin (ANCEF) IVPB 2g/100 mL premix        2 g 200 mL/hr over 30 Minutes Intravenous On call to O.R. 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 Fredricka Bonine Laparoscopic lysis of adhesions x 75 minutes Laparoscopic Reduction of incarcerated giant paraesophageal hernia and detorsion of gastric volvulue Laparoscopic Wedge resection, fundus of stomach Laparoscopic primary repair of hiatal hernia Laparoscopic gastrostomy tube insertion Primary repair of umbilical hernia Upper endoscopy    Wheezing - improved after diuresis FEN: sips with meds, ice chips, G-tube clamped; may have sips of clears from floor stock; TPN   ID: cefepime/flagyl 1/4 >> continue for 5 days post-op due  to spillage of gastric contents during surgery  VTE: SCD's, Lovenox Foley: D/C on POD #1 - Purewick Dispo: med-surg, tx to 6N surgical floor, await bowel function and plan for slow advancement of diet, G-tube clamped   HTN - on norvasc at home, hold for now, PRN IV metoprolol  GERD - PPI   LOS: 6 days    Alyssa Peterson 03/30/2022

## 2022-03-30 NOTE — Progress Notes (Addendum)
Pharmacy Antibiotic Note  Alyssa Peterson is a 72 y.o. female admitted on 03/24/2022 with gastric outlet obstruction 2/2 hiatal hernia. S/p laparoscopic lysis of adhesions, reduction of hernia, detorsion of gastric volvulue, and wedge resection 1/4 - found to have extensive intra-abdominal adhesions and gastric perforation.  Pharmacy has been consulted for Cefepime and Flagyl dosing for intra-abdominal infection.   POD #3, afebrile, WBC wnl. Surgical path results: benign stomach with transmural defect and serosal adhesions; fibroadipose tissue consistent with hernia sac  Per surgery MD - patient to continue antibiotics for 5-days post-op due to spillage of gastric contents per surgery.  Plan: Cefepime 2 g IV q12h Flagyl 500 mg IV q12h F/u clinical course, renal function, surgery recs  Height: 5\' 3"  (160 cm) Weight: 59 kg (130 lb) IBW/kg (Calculated) : 52.4  Temp (24hrs), Avg:98.4 F (36.9 C), Min:98.2 F (36.8 C), Max:98.7 F (37.1 C)  Recent Labs  Lab 03/24/22 0456 03/25/22 0309 03/26/22 0208 03/27/22 0231 03/28/22 0413 03/29/22 0300 03/30/22 0332  WBC 5.9 7.2 6.0  --  4.1 7.2  --   CREATININE 1.00 0.79 0.81 0.82 0.89 0.79 0.68    Estimated Creatinine Clearance: 54.9 mL/min (by C-G formula based on SCr of 0.68 mg/dL).    Allergies  Allergen Reactions   Latex Itching    hands   Penicillins Hives and Rash    Antimicrobials this admission: Cefepime 1/4 >> (1/9) Flagyl 1/4 >> (1/9)   Thank you for allowing pharmacy to be a part of this patient's care.  Vance Peper, PharmD PGY-2 Pharmacy Resident Phone [phone] 03/30/2022 9:32 AM   Please check AMION for all [place] phone numbers After 10:00 PM, call [place] [phone]

## 2022-03-30 NOTE — Progress Notes (Addendum)
PHARMACY - TOTAL PARENTERAL NUTRITION CONSULT NOTE   Indication: Gastric outlet obstruction, hiatal hernia, intra-abdominal adhesions, gastric perforation  Patient Measurements: Height: 5\' 3"  (160 cm) Weight: 59 kg (130 lb) IBW/kg (Calculated) : 52.4 TPN AdjBW (KG): 59 Body mass index is 23.03 kg/m. Usual Weight: 60 kg  Assessment: 72 yo female presented w/ abdominal pain and vomiting. Known to have large hiatal hernia and gastric volvulus but new gastric outlet obstruction. S/p laparoscopic lysis of adhesions, repair of hernia, and gastrostomy tube insertion. OR findings significant for extensive intra-abdominal adhesions, gastric perforation, and hepatomegaly. Pharmacy consulted to dose TPN.  Glucose / Insulin: no hx DM, [name] 5.9, CBG 120-154 - 3units SSI Electrolytes: Na 134, K 3.3, CO2 26, CoCa 9.5, Phos 2.1, Mg 1.8 Renal: Scr 0.68, BUN WNL Hepatic: LFTs WNL, albumin 2.6, tbili 1.1 Intake / Output; MIVF: UOP 1.7 mL/kg/hr, G-tube 0 mL, LBM 1/2, MIVF D5/0.45% NS @50  GI Imaging: none since TPN start GI Surgeries / Procedures: none since TPN start  Central access: 03/28/21 TPN start date: 03/28/21  Nutritional Goals: Goal TPN rate is 70 mL/hr (provides 97 g of protein and 1798 kcals per day)  RD Assessment: Estimated Needs Total Energy Estimated Needs: 1770-2065 kcals Total Protein Estimated Needs: 85-100 gm Total Fluid Estimated Needs: >/= 1.7 L  Current Nutrition:  NPO and TPN  Plan:  Start TPN at half goal rate of 40 mL/hr at 1800 - will keep at half rate until electrolyte disturbances improve (likely from refeeding) Electrolytes in TPN: Na 73mEq/L, Incr K 75 mEq/L, Ca 60mEq/L, Incr Mg 6mEq/L, and Incr Phos 12mmol/L. Cl:Ac 1:1 Kphos 30 mmol IV once Kcl 10 meq IV x 4 Mag 2 gm IV x 1 Add standard MVI and trace elements to TPN Initiate Sensitive q6h SSI and adjust as needed  Continue MIVF at 50 mL/hr at 1800 Monitor TPN labs daily until stable then on Mon/Thurs  Corinda Gubler, PharmD Clinical Pharmacist 03/30/2022,7:27 AM

## 2022-03-31 ENCOUNTER — Inpatient Hospital Stay (HOSPITAL_COMMUNITY): Payer: Medicare Other

## 2022-03-31 LAB — COMPREHENSIVE METABOLIC PANEL
ALT: 21 U/L (ref 0–44)
AST: 15 U/L (ref 15–41)
Albumin: 1.8 g/dL — ABNORMAL LOW (ref 3.5–5.0)
Alkaline Phosphatase: 47 U/L (ref 38–126)
Anion gap: 4 — ABNORMAL LOW (ref 5–15)
BUN: 12 mg/dL (ref 8–23)
CO2: 25 mmol/L (ref 22–32)
Calcium: 8.1 mg/dL — ABNORMAL LOW (ref 8.9–10.3)
Chloride: 106 mmol/L (ref 98–111)
Creatinine, Ser: 0.6 mg/dL (ref 0.44–1.00)
GFR, Estimated: >60 mL/min (ref >60)
Glucose, Bld: 170 mg/dL — ABNORMAL HIGH (ref 70–99)
Potassium: 3.5 mmol/L (ref 3.5–5.1)
Sodium: 135 mmol/L (ref 135–145)
Total Bilirubin: 1 mg/dL (ref 0.3–1.2)
Total Protein: 4.7 g/dL — ABNORMAL LOW (ref 6.5–8.1)

## 2022-03-31 LAB — GLUCOSE, CAPILLARY
Glucose-Capillary: 154 mg/dL — ABNORMAL HIGH (ref 70–99)
Glucose-Capillary: 160 mg/dL — ABNORMAL HIGH (ref 70–99)
Glucose-Capillary: 161 mg/dL — ABNORMAL HIGH (ref 70–99)
Glucose-Capillary: 164 mg/dL — ABNORMAL HIGH (ref 70–99)
Glucose-Capillary: 167 mg/dL — ABNORMAL HIGH (ref 70–99)

## 2022-03-31 LAB — PHOSPHORUS: Phosphorus: 2.3 mg/dL — ABNORMAL LOW (ref 2.5–4.6)

## 2022-03-31 LAB — MAGNESIUM: Magnesium: 2 mg/dL (ref 1.7–2.4)

## 2022-03-31 LAB — TRIGLYCERIDES: Triglycerides: 61 mg/dL (ref <150)

## 2022-03-31 MED ORDER — INSULIN ASPART 100 UNIT/ML IJ SOLN
0.0000 [IU] | Freq: Four times a day (QID) | INTRAMUSCULAR | Status: DC
  Administered 2022-03-31 – 2022-04-02 (×9): 3 [IU] via SUBCUTANEOUS
  Administered 2022-04-03 (×3): 2 [IU] via SUBCUTANEOUS
  Administered 2022-04-03 – 2022-04-04 (×3): 3 [IU] via SUBCUTANEOUS
  Administered 2022-04-04 – 2022-04-08 (×5): 2 [IU] via SUBCUTANEOUS
  Administered 2022-04-09: 5 [IU] via SUBCUTANEOUS
  Administered 2022-04-10: 2 [IU] via SUBCUTANEOUS

## 2022-03-31 MED ORDER — TRAVASOL 10 % IV SOLN
INTRAVENOUS | Status: AC
  Filled 2022-03-31: qty 900

## 2022-03-31 MED ORDER — SODIUM CHLORIDE 0.9 % IV SOLN: INTRAVENOUS | Status: DC

## 2022-03-31 MED ORDER — POTASSIUM PHOSPHATES 15 MMOLE/5ML IV SOLN: 15.0000 mmol | Freq: Once | INTRAVENOUS | Status: DC

## 2022-03-31 MED ORDER — POTASSIUM PHOSPHATES 15 MMOLE/5ML IV SOLN
30.0000 mmol | Freq: Once | INTRAVENOUS | Status: AC
  Administered 2022-03-31: 30 mmol via INTRAVENOUS
  Filled 2022-03-31: qty 10

## 2022-03-31 MED ORDER — MAGNESIUM SULFATE IN D5W 1-5 GM/100ML-% IV SOLN
1.0000 g | Freq: Once | INTRAVENOUS | Status: AC
  Administered 2022-03-31: 1 g via INTRAVENOUS
  Filled 2022-03-31: qty 100

## 2022-03-31 NOTE — Plan of Care (Signed)

## 2022-03-31 NOTE — Care Management Important Message (Signed)
Important Message  Patient Details  Name: Alyssa Peterson MRN: [id number] Date of Birth: November 07, 1950   Medicare Important Message Given:  Yes     Shelda Altes 03/31/2022, 8:14 AM

## 2022-03-31 NOTE — Progress Notes (Signed)
4 Days Post-Op   Subjective/Chief Complaint: Feels ok.  Coughing more today.  Only 300cc of urine documented yesterday.  No nausea with g-tube clamped for 48 hrs and few sips of liquids so far.  Up in chair for 4-5 hrs yesterday   Objective: Vital signs in last 24 hours: Temp:  [98.6 F (37 C)-99.5 F (37.5 C)] 99.5 F (37.5 C) (01/08 0828) Pulse Rate:  [87-98] 87 (01/08 0828) Resp:  [16-18] 18 (01/08 0828) BP: (129-138)/(82-92) 138/92 (01/08 0828) SpO2:  [95 %-98 %] 98 % (01/08 0828) Last BM Date : 03/31/22  Intake/Output from previous day: 01/07 0701 - 01/08 0700 In: 2807.7 [I.V.:1597.7; IV Piggyback:1210] Out: 300 [Urine:300] Intake/Output this shift: No intake/output data recorded.  WDWN in NAD Resp - CTAB, but has a bit of tachypnea at times. Abd - soft, minimal tenderness Incisions c/d/I G-tube clamped  Lab Results:  Recent Labs    03/29/22 0300  WBC 7.2  HGB 11.1*  HCT 32.4*  PLT 118*   BMET Recent Labs    03/30/22 2139 03/31/22 0341  NA 138 135  K 3.7 3.5  CL 106 106  CO2 26 25  GLUCOSE 139* 170*  BUN 12 12  CREATININE 0.61 0.60  CALCIUM 8.4* 8.1*   PT/INR No results for input(s): "LABPROT", "INR" in the last 72 hours. ABG No results for input(s): "PHART", "HCO3" in the last 72 hours.  Invalid input(s): "PCO2", "PO2"  Studies/Results: No results found.  Anti-infectives: Anti-infectives (From admission, onward)    Start     Dose/Rate Route Frequency Ordered Stop   03/27/22 1600  ceFEPIme (MAXIPIME) 2 g in sodium chloride 0.9 % 100 mL IVPB        2 g 200 mL/hr over 30 Minutes Intravenous Every 12 hours 03/27/22 1501 04/01/22 1559   03/27/22 1600  metroNIDAZOLE (FLAGYL) IVPB 500 mg        500 mg 100 mL/hr over 60 Minutes Intravenous Every 12 hours 03/27/22 1501 04/01/22 1559   03/27/22 0600  ceFAZolin (ANCEF) IVPB 2g/100 mL premix        2 g 200 mL/hr over 30 Minutes Intravenous On call to O.R. 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 Kae Heller Laparoscopic lysis of adhesions x 75 minutes Laparoscopic Reduction of incarcerated giant paraesophageal hernia and detorsion of gastric volvulue Laparoscopic Wedge resection, fundus of stomach Laparoscopic primary repair of hiatal hernia Laparoscopic gastrostomy tube insertion Primary repair of umbilical hernia Upper endoscopy   -continue with g-tube clamped.  Will add CLD as she has had no nausea with g-tube clamped and sips of clears. -mobilize and pulm toilet.  Pulls 1250-1500 on IS  FEN: g-tube clamped, CLD; TPN   ID: cefepime/flagyl 1/4 >> continue for 5 days post-op due to spillage of gastric contents during surgery  VTE: SCD's, Lovenox Foley: D/C on POD #1 - Purewick Dispo: med-surg, await bowel function and plan for slow advancement of diet, G-tube clamped   HTN - on norvasc at home, hold for now, PRN IV metoprolol  GERD - PPI  Pleural effusion/cough - responded well to lasix initially.  Repeat CXR today due to coughing and some intermittent tachypnea   LOS: 7 days    Henreitta Cea PA-C 03/31/2022

## 2022-03-31 NOTE — Progress Notes (Signed)
PHARMACY - TOTAL PARENTERAL NUTRITION CONSULT NOTE  Indication: Gastric outlet obstruction, hiatal hernia, intra-abdominal adhesions, gastric perforation  Patient Measurements: Height: 5\' 3"  (160 cm) Weight: 59 kg (130 lb) IBW/kg (Calculated) : 52.4 TPN AdjBW (KG): 59 Body mass index is 23.03 kg/m. Usual Weight: 60 kg  Assessment:  [name] presented w/ abdominal pain and vomiting. Known to have large hiatal hernia and gastric volvulus but new gastric outlet obstruction. S/p laparoscopic LoA, repair of hernia, and gastrostomy tube insertion. OR findings significant for extensive intra-abdominal adhesions, gastric perforation, and hepatomegaly. Pharmacy consulted to dose TPN.  Glucose / Insulin: no hx DM, [name] 5.9%.  CBGs < 180. Used 5 units SSI in the past 24 hrs Electrolytes: refeeding - K 3.5 and Phos 2.3 post KPhos 93mmol and 4 runs KCL, Mag 2 post 2gm IV, others WNL Renal: SCr <1, BUN WNL Hepatic: LFTs / tbili / TG WNL, albumin 1.8 Intake / Output; MIVF: UOP 0.2 mL/kg/hr, drain 72mL, BM x4, D5/0.45%NS 50 ml/hr GI Imaging: none since TPN start GI Surgeries / Procedures: none since TPN start  Central access: 03/28/22 TPN start date: 03/28/22  Nutritional Goals:  RD Estimated Needs Total Energy Estimated Needs: 1770-2065 kcals Total Protein Estimated Needs: 85-100 gm Total Fluid Estimated Needs: >/= 1.7 L  Current Nutrition:  NPO and TPN  Plan:  Increase TPN to goal rate of 75 ml/hr at 1800 to provide 90g AA and 1787 kCal, meeting 100% of needs Electrolytes in TPN: Na 53mEq/L, K 69mEq/L, Ca 52mEq/L, Mg 44mEq/L, Phos 84mmol/L, Cl:Ac 1:1- all lytes increased with increasing TPN rate Add standard MVI and trace elements to TPN Increase SSI to moderate Q6H Reduce D51/2NS to 20 ml/hr when new TPN bag starts, change to NS Repeat KPhos 29mmol IV Mag sulfate 1gm IV Monitor TPN labs daily until stable then on Mon/Thurs  Zenda Herskowitz D. Mina Marble, PharmD, BCPS, [place] 03/31/2022, 8:25 AM

## 2022-04-01 ENCOUNTER — Inpatient Hospital Stay (HOSPITAL_COMMUNITY): Payer: Medicare Other

## 2022-04-01 LAB — BASIC METABOLIC PANEL
Anion gap: 9 (ref 5–15)
BUN: 11 mg/dL (ref 8–23)
CO2: 25 mmol/L (ref 22–32)
Calcium: 8.1 mg/dL — ABNORMAL LOW (ref 8.9–10.3)
Chloride: 105 mmol/L (ref 98–111)
Creatinine, Ser: 0.54 mg/dL (ref 0.44–1.00)
GFR, Estimated: >60 mL/min (ref >60)
Glucose, Bld: 164 mg/dL — ABNORMAL HIGH (ref 70–99)
Potassium: 3.6 mmol/L (ref 3.5–5.1)
Sodium: 139 mmol/L (ref 135–145)

## 2022-04-01 LAB — CBC
HCT: 33.2 % — ABNORMAL LOW (ref 36.0–46.0)
Hemoglobin: 10.9 g/dL — ABNORMAL LOW (ref 12.0–15.0)
MCH: 28.5 pg (ref 26.0–34.0)
MCHC: 32.8 g/dL (ref 30.0–36.0)
MCV: 86.9 fL (ref 80.0–100.0)
Platelets: 166 10*3/uL (ref 150–400)
RBC: 3.82 MIL/uL — ABNORMAL LOW (ref 3.87–5.11)
RDW: 14.6 % (ref 11.5–15.5)
WBC: 10.3 10*3/uL (ref 4.0–10.5)
nRBC: 0.3 % — ABNORMAL HIGH (ref 0.0–0.2)

## 2022-04-01 LAB — GLUCOSE, CAPILLARY
Glucose-Capillary: 189 mg/dL — ABNORMAL HIGH (ref 70–99)
Glucose-Capillary: 169 mg/dL — ABNORMAL HIGH (ref 70–99)
Glucose-Capillary: 170 mg/dL — ABNORMAL HIGH (ref 70–99)
Glucose-Capillary: 172 mg/dL — ABNORMAL HIGH (ref 70–99)

## 2022-04-01 LAB — MAGNESIUM: Magnesium: 2.1 mg/dL (ref 1.7–2.4)

## 2022-04-01 LAB — PHOSPHORUS: Phosphorus: 2.2 mg/dL — ABNORMAL LOW (ref 2.5–4.6)

## 2022-04-01 MED ORDER — POTASSIUM PHOSPHATES 15 MMOLE/5ML IV SOLN
45.0000 mmol | Freq: Once | INTRAVENOUS | Status: AC
  Administered 2022-04-01: 45 mmol via INTRAVENOUS
  Filled 2022-04-01: qty 15

## 2022-04-01 MED ORDER — LATANOPROST 0.005 % OP SOLN
1.0000 [drp] | Freq: Every day | OPHTHALMIC | Status: DC
  Administered 2022-04-02 – 2022-04-10 (×9): 1 [drp] via OPHTHALMIC
  Filled 2022-04-01: qty 2.5

## 2022-04-01 MED ORDER — IOHEXOL 300 MG/ML  SOLN
100.0000 mL | Freq: Once | INTRAMUSCULAR | Status: AC | PRN
  Administered 2022-04-01: 25 mL via ORAL

## 2022-04-01 MED ORDER — TRAVASOL 10 % IV SOLN
INTRAVENOUS | Status: AC
  Filled 2022-04-01: qty 900

## 2022-04-01 NOTE — Progress Notes (Signed)
Mobility Specialist - Progress Note   04/01/22 1144  Mobility  Activity Ambulated with assistance in hallway  Level of Assistance Standby assist, set-up cues, supervision of patient - no hands on  Assistive Device Front wheel walker  Distance Ambulated (ft) 200 ft  Activity Response Tolerated well  Mobility Referral Yes  $Mobility charge 1 Mobility    Pt received in bed agreeable to mobility. No complaints throughout, tolerated distance well. Left in bed w/ call bell in reach and all needs met.   [place] Specialist Please contact via SecureChat or Rehab office at [phone]

## 2022-04-01 NOTE — Progress Notes (Signed)
PHARMACY - TOTAL PARENTERAL NUTRITION CONSULT NOTE  Indication: Gastric outlet obstruction, hiatal hernia, intra-abdominal adhesions, gastric perforation  Patient Measurements: Height: 5\' 3"  (160 cm) Weight: 59 kg (130 lb) IBW/kg (Calculated) : 52.4 TPN AdjBW (KG): 59 Body mass index is 23.03 kg/m. Usual Weight: 60 kg  Assessment:  [name] presented w/ abdominal pain and vomiting. Known to have large hiatal hernia and gastric volvulus but new gastric outlet obstruction. S/p laparoscopic LoA, repair of hernia, and gastrostomy tube insertion. OR findings significant for extensive intra-abdominal adhesions, gastric perforation, and hepatomegaly. Pharmacy consulted to dose TPN.  Glucose / Insulin: no hx DM, [name] 5.9%.  CBGs acceptable Used 10 units SSI in the past 24 hrs Electrolytes: refeeding - K 3.6 and Phos 2.2 post KPhos 43mmol, Mag 2 post 1gm IV, others WNL Renal: SCr <1, BUN WNL Hepatic: LFTs / tbili / TG WNL, albumin 1.8 Intake / Output; MIVF: UOP not documented, drain 84mL, BM x3 GI Imaging: none since TPN start GI Surgeries / Procedures: none since TPN start  Central access: 03/28/22 TPN start date: 03/28/22  Nutritional Goals:  RD Estimated Needs Total Energy Estimated Needs: 1770-2065 kcals Total Protein Estimated Needs: 85-100 gm Total Fluid Estimated Needs: >/= 1.7 L  Current Nutrition:  TPN CLD started 1/8  Plan:  Continue TPN at goal rate of 75 ml/hr to provide 90g AA and 1787 kCal, meeting 100% of needs Electrolytes in TPN: Na 5mEq/L, K 18mEq/L, Ca 81mEq/L, Mg 38mEq/L, increase Phos to 65mmol/L, Cl:Ac 1:1 Add standard MVI and trace elements to TPN Continue moderate SSI Q6H KPhos 65mmol IV Monitor TPN labs daily until stable then on Mon/Thurs F/u PO intake and ability to advance diet  Alyssa Peterson, PharmD, BCPS, [place] 04/01/2022, 7:49 AM

## 2022-04-01 NOTE — TOC CM/SW Note (Signed)
  Transition of Care (TOC) Screening Note   Patient Details  Name: Alyssa Peterson Date of Birth: Jul 22, 1950    Transition of Care Department [facility])  will continue to monitor patient advancement through interdisciplinary progression rounds. If new patient transition needs arise, please place a TOC consult.    Await bowel function and plan for slow advancement of diet, G-tube clamped , TPN

## 2022-04-01 NOTE — Progress Notes (Signed)
Spoke to husband and patient at bedside and answered all questions and reviewed her imaging and findings.  We will re-clamp her g-tube and continue clear liquids at this time.  Restart home eye drops for her glaucoma.  Henreitta Cea 2:14 PM 04/01/2022

## 2022-04-01 NOTE — Progress Notes (Signed)
PCCM Note  Case and CT reviewed.  Recurrent cough, tachypnea post incarcerated paraesophageal hernia repair with gastric outlet obstruction.  Eventually culminated in CT which is read as possible mucus plugging.  Personally reviewed: complex anatomy post repair, not sure what is pleural fluid vs. residual GI space: most of the atelectasis I think is just mechanical from GI tract.  Do not think bronch would help here at this point.  Try hypertonic, CPT and mobility.  If languishes, a chest CT with oral and IV contrast as well as potential TCTS consult may be helpful.  Please reach out if any questions.  Erskine Emery MD PCCM

## 2022-04-01 NOTE — Progress Notes (Addendum)
5 Days Post-Op   Subjective/Chief Complaint:  NAEO. Denies abd pain, nausea, or emesis.   Objective: Vital signs in last 24 hours: Temp:  [98 F (36.7 C)-98.8 F (37.1 C)] 98.8 F (37.1 C) (01/09 0752) Pulse Rate:  [73-96] 81 (01/09 0752) Resp:  [16-18] 16 (01/09 0752) BP: (135-144)/(86-97) 143/97 (01/09 0752) SpO2:  [97 %-98 %] 97 % (01/09 0752) Last BM Date : 03/31/22  Intake/Output from previous day: 01/08 0701 - 01/09 0700 In: 1327.4 [I.V.:927.4; IV Piggyback:400] Out: 0  Intake/Output this shift: No intake/output data recorded.  WDWN in NAD Resp - CTAB, but has a bit of tachypnea at times. Abd - soft, mild distention, minimal tenderness, G tube clamped Incisions c/d/I  Lab Results:  Recent Labs    04/01/22 0739  WBC 10.3  HGB 10.9*  HCT 33.2*  PLT 166   BMET Recent Labs    03/31/22 0341 04/01/22 0242  NA 135 139  K 3.5 3.6  CL 106 105  CO2 25 25  GLUCOSE 170* 164*  BUN 12 11  CREATININE 0.60 0.54  CALCIUM 8.1* 8.1*   PT/INR No results for input(s): "LABPROT", "INR" in the last 72 hours. ABG No results for input(s): "PHART", "HCO3" in the last 72 hours.  Invalid input(s): "PCO2", "PO2"  Studies/Results: CT CHEST WO CONTRAST  Result Date: 03/31/2022 CLINICAL DATA:  Pneumonia, complication suspected. X-ray done. Status post hiatal hernia repair. Coughing with fluid retention in the hernia sac. Evaluation for better idea if there is more going on the simple effusion. EXAM: CT CHEST WITHOUT CONTRAST TECHNIQUE: Multidetector CT imaging of the chest was performed following the standard protocol without IV contrast. RADIATION DOSE REDUCTION: This exam was performed according to the departmental dose-optimization program which includes automated exposure control, adjustment of the mA and/or kV according to patient size and/or use of iterative reconstruction technique. COMPARISON:  Radiographs earlier today and CT abdomen and pelvis 03/24/2022 FINDINGS:  Evaluation is limited by lack of IV contrast. Cardiovascular: Heart size upper limits of normal. Small pericardial effusion. Right PICC tip in the low SVC. Mediastinum/Nodes: No definite adenopathy contrast. Fluid level within the esophagus reaching near the thoracic inlet. The mid and distal esophagus are poorly visualized. Redemonstrated large hiatal hernia with intrathoracic stomach in the right hemithorax. Lungs/Pleura: Complete collapse of the right middle and lower lobes with mucous plugging. Small-moderate low-density right pleural effusion (Hounsfield units approximately 9). Small left pleural effusion and partial atelectasis of the left lower lobe with mucous plugging. Atelectasis in the lingula. Upper Abdomen: New surgical clips near the expected location of the gastroesophageal junction likely related to hernia repair on 03/27/2022. Partially visualized stranding and edema about the intraabdominal portion of the stomach. New herniation of the anterior left lobe of the liver along with fat and locules of gas into a subxiphoid hernia in the anterior chest/abdominal wall Musculoskeletal: No acute osseous abnormality. IMPRESSION: 1. Redemonstrated large hiatal hernia with intrathoracic portions of the stomach in the right hemithorax. 2. Complete collapse of the right middle and lower lobe and partial collapse of the left lower lobe with mucous plugging. This is favored due to aspiration given large hernia and fluid level in the esophagus. 3. Small-moderate right and small left pleural effusions are the density of simple fluid. 4. Marked stranding and edema in the upper abdomen about the intraabdominal portion of the stomach. Partial herniation of the right lobe of the liver along with fat and locules of gas into a subxiphoid hernia. These findings  may be postoperative in nature however if there is concern for perforation, CT chest and abdomen with IV and oral contrast is recommended. This case was discussed  by telephone at the time of interpretation on 03/31/2022 at 11:53 pm to provider Dr. Bobbye Morton, who verbally acknowledged these results. Electronically Signed   By: Placido Sou M.D.   On: 03/31/2022 23:54   DG CHEST PORT 1 VIEW  Result Date: 03/31/2022 CLINICAL DATA:  Pleural effusion.  Follow-up exam. EXAM: PORTABLE CHEST 1 VIEW COMPARISON:  03/28/2022. FINDINGS: Opacity obscures the lower half of the right hemithorax, unchanged from the prior exam. There is also opacity at the left lung base obscuring the hemidiaphragm, also unchanged. Remainder of the lungs is clear. No pneumothorax. Right PICC stable and well positioned. IMPRESSION: 1. No significant change from the most recent prior exam. 2. Right mid to lower hemithorax opacity and left lung base opacity consistent with a combination of pleural effusions and atelectasis and/or pneumonia. No evidence of pulmonary edema. Electronically Signed   By: Lajean Manes M.D.   On: 03/31/2022 14:15    Anti-infectives: Anti-infectives (From admission, onward)    Start     Dose/Rate Route Frequency Ordered Stop   03/27/22 1600  ceFEPIme (MAXIPIME) 2 g in sodium chloride 0.9 % 100 mL IVPB        2 g 200 mL/hr over 30 Minutes Intravenous Every 12 hours 03/27/22 1501 04/01/22 0427   03/27/22 1600  metroNIDAZOLE (FLAGYL) IVPB 500 mg        500 mg 100 mL/hr over 60 Minutes Intravenous Every 12 hours 03/27/22 1501 04/01/22 0456   03/27/22 0600  ceFAZolin (ANCEF) IVPB 2g/100 mL premix        2 g 200 mL/hr over 30 Minutes Intravenous On call to O.R. 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 Kae Heller Laparoscopic lysis of adhesions x 75 minutes Laparoscopic Reduction of incarcerated giant paraesophageal hernia and detorsion of gastric volvulue Laparoscopic Wedge resection, fundus of stomach Laparoscopic primary repair of hiatal  hernia Laparoscopic gastrostomy tube insertion Primary repair of umbilical hernia Upper endoscopy   recurrent hiatal hernia - pt w/ productive cough and tachypnea 1/8 prompting CXR >> R pleural effusion/consolidation >> CT chest ordered showing hiatal hernia and possible aspiration. - place G tube to gravity - UGI today to further evaluate  FEN: g-tube clamped, CLD; TPN   ID: cefepime/flagyl 1/4 >> continue for 5 days post-op due to spillage of gastric contents during surgery  VTE: SCD's, Lovenox Foley: D/C on POD #1 - Purewick Dispo: med-surg, await bowel function and plan for slow advancement of diet, G-tube clamped   HTN - on norvasc at home, hold for now, PRN IV metoprolol  GERD - PPI    LOS: 8 days    Jill Alexanders PA-C 04/01/2022

## 2022-04-02 LAB — BASIC METABOLIC PANEL
Anion gap: 5 (ref 5–15)
BUN: 11 mg/dL (ref 8–23)
CO2: 25 mmol/L (ref 22–32)
Calcium: 8.1 mg/dL — ABNORMAL LOW (ref 8.9–10.3)
Chloride: 104 mmol/L (ref 98–111)
Creatinine, Ser: 0.55 mg/dL (ref 0.44–1.00)
GFR, Estimated: >60 mL/min (ref >60)
Glucose, Bld: 207 mg/dL — ABNORMAL HIGH (ref 70–99)
Potassium: 4 mmol/L (ref 3.5–5.1)
Sodium: 134 mmol/L — ABNORMAL LOW (ref 135–145)

## 2022-04-02 LAB — PHOSPHORUS: Phosphorus: 2.9 mg/dL (ref 2.5–4.6)

## 2022-04-02 LAB — GLUCOSE, CAPILLARY
Glucose-Capillary: 203 mg/dL — ABNORMAL HIGH (ref 70–99)
Glucose-Capillary: 199 mg/dL — ABNORMAL HIGH (ref 70–99)
Glucose-Capillary: 180 mg/dL — ABNORMAL HIGH (ref 70–99)
Glucose-Capillary: 177 mg/dL — ABNORMAL HIGH (ref 70–99)

## 2022-04-02 MED ORDER — AMLODIPINE BESYLATE 10 MG PO TABS
10.0000 mg | ORAL_TABLET | Freq: Every day | ORAL | Status: DC
  Administered 2022-04-02 – 2022-04-10 (×8): 10 mg via ORAL
  Filled 2022-04-02 (×9): qty 1

## 2022-04-02 MED ORDER — HYDROMORPHONE HCL 1 MG/ML IJ SOLN: 0.5000 mg | INTRAMUSCULAR | Status: DC | PRN

## 2022-04-02 MED ORDER — TRAVASOL 10 % IV SOLN
INTRAVENOUS | Status: AC
  Filled 2022-04-02: qty 420

## 2022-04-02 MED ORDER — ENSURE ENLIVE PO LIQD
237.0000 mL | Freq: Two times a day (BID) | ORAL | Status: DC
  Administered 2022-04-02 – 2022-04-03 (×3): 237 mL via ORAL

## 2022-04-02 NOTE — Progress Notes (Signed)
Mobility Specialist - Progress Note   04/02/22 1133  Mobility  Activity Ambulated with assistance in hallway  Level of Assistance Standby assist, set-up cues, supervision of patient - no hands on  Assistive Device Front wheel walker  Distance Ambulated (ft) 300 ft  Activity Response Tolerated well  Mobility Referral Yes  $Mobility charge 1 Mobility    Pt received in bed agreeable to mobility. No complaints throughout, tolerated increased distance well. Left in bed w/ call bell in reach.   [place] Specialist Please contact via SecureChat or Rehab office at [phone]

## 2022-04-02 NOTE — Progress Notes (Signed)
Nutrition Follow-up  DOCUMENTATION CODES:   Severe malnutrition in context of chronic illness  INTERVENTION:  TPN management per pharmacy Ensure Enlive po BID, each supplement provides 350 kcal and 20 grams of protein. Request updated weight  NUTRITION DIAGNOSIS:   Severe Malnutrition related to chronic illness as evidenced by severe fat depletion, severe muscle depletion.  GOAL:   Patient will meet greater than or equal to 90% of their needs  MONITOR:   PO intake, Labs, Diet advancement, Weight trends, I & O's  REASON FOR ASSESSMENT:   Malnutrition Screening Tool    ASSESSMENT:   72 y.o. female admits related to abdominal pain and vomiting. PMH includes: anemia, GERD, HTN, inguinal hernia. Pt is currently receiving medical management related to hiatal hernia.  01/04: s/p lap lysis of adhesions, reduction of incarcerated giant paraesophageal hernia and detorsion of gastric volvulue, wedge resection, fundus of stomach, primary repair of hiatal hernia, G-tube insertion for venting, repair of umbilical hernia, upper endoscopy  Per Surgery, G-tube to gravity today, advance to full liquid diet, and half rate TPN. Plans to advance to soft diet as tolerated with Ensure for a couple weeks.   TPN decrease to 1/2 rate to 35 ml/hr to provide 42g AA and 834 kCal, meeting 50% of needs   Pt sitting up in chair at time of visit. She is in good spirits. Reports that she has been tolerating the clear liquid diet well yesterday without episodes of emesis and is looking forward to a more progressed diet with goal of solid foods again. She is familiar with ensure supplements as she received them during a prior admission and enjoys them greatly. Encouraged her to continue these at home after discharge until she is able to increase her PO intake.   She states that PTA she was eating very small portions such as kids meals. Her decreased PO intake began around summertime. She endorsed having  abdominal pain and belching which she was hopeful would improve but only noticed it to get worse.   Pt states that her daughters started becoming concerned with her weight loss. She reports a weight loss of about 4 lbs (128lbs down to 1254 lbs) within the last couple weeks. There is limited documentation of weight history within the last year. Pt's weight noted to be 57 kg on 08/16/21, down to 54.1 kg on 01/07/22. Her weight is now documented to be 59 kg on 03/27/22. Will request updated weight to assess.   Medications: SSI 0-15 units q6h, reglan, IV protonix  Labs: sodium 134, CBG's 169-203 x24 hours  NUTRITION - FOCUSED PHYSICAL EXAM:  Flowsheet Row Most Recent Value  Orbital Region Severe depletion  Upper Arm Region Severe depletion  Thoracic and Lumbar Region Moderate depletion  Buccal Region Severe depletion  Temple Region Severe depletion  Clavicle Bone Region Severe depletion  Clavicle and Acromion Bone Region Severe depletion  Scapular Bone Region Severe depletion  Dorsal Hand Severe depletion  Patellar Region Severe depletion  Anterior Thigh Region Severe depletion  Posterior Calf Region Severe depletion  Edema (RD Assessment) None  Hair Reviewed  Eyes Reviewed  Mouth Reviewed  Skin Reviewed  Nails Reviewed      Diet Order:   Diet Order             Diet full liquid Room service appropriate? Yes; Fluid consistency: Thin  Diet effective now                   EDUCATION NEEDS:  Education needs have been addressed  Skin:  Skin Assessment: Reviewed RN Assessment  Last BM:  03/25/22  Height:   Ht Readings from Last 1 Encounters:  03/27/22 5\' 3"  (1.6 m)    Weight:   Wt Readings from Last 1 Encounters:  03/27/22 59 kg   BMI:  Body mass index is 23.03 kg/m.  Estimated Nutritional Needs:   Kcal:  1700-1900  Protein:  85-100g  Fluid:  >/=1.7L  Alyssa Peterson, RDN, LDN Clinical Nutrition

## 2022-04-02 NOTE — Progress Notes (Signed)
6 Days Post-Op   Subjective/Chief Complaint: Feels fine, having bowel function, no issues breathing, tol clears without issue   Objective: Vital signs in last 24 hours: Temp:  [98.2 F (36.8 C)-98.5 F (36.9 C)] 98.2 F (36.8 C) (01/10 0438) Pulse Rate:  [80-86] 80 (01/10 0438) Resp:  [16] 16 (01/09 1646) BP: (134-136)/(81-97) 134/81 (01/10 0438) SpO2:  [97 %-98 %] 98 % (01/10 0438) Last BM Date : 04/01/22  Intake/Output from previous day: 01/09 0701 - 01/10 0700 In: 516.8 [IV Piggyback:516.8] Out: -  Intake/Output this shift: No intake/output data recorded.  NAD Resp - effort normal, no tachypnea, clear Abd - soft, nondistended, minimal tenderness, G tube clamped Incisions c/d/I    Lab Results:  Recent Labs    04/01/22 0739  WBC 10.3  HGB 10.9*  HCT 33.2*  PLT 166   BMET Recent Labs    04/01/22 0242 04/02/22 0310  NA 139 134*  K 3.6 4.0  CL 105 104  CO2 25 25  GLUCOSE 164* 207*  BUN 11 11  CREATININE 0.54 0.55  CALCIUM 8.1* 8.1*   PT/INR No results for input(s): "LABPROT", "INR" in the last 72 hours. ABG No results for input(s): "PHART", "HCO3" in the last 72 hours.  Invalid input(s): "PCO2", "PO2"  Studies/Results: DG UGI W SINGLE CM (SOL OR THIN BA)  Result Date: 04/01/2022 CLINICAL DATA:  Patient with incarcerated paraesophageal hernia status post lysis of adhesions, hernia repair, gastrostomy tube insertion on January 4th 2024. Request is for single upper GI for further evaluation possible hernia EXAM: DG UGI W SINGLE CM TECHNIQUE: Scout radiograph was obtained. Single contrast examination was performed using thin liquid barium. This exam was performed by Rushie Nyhan, and was supervised and interpreted by Dr. Kalman Jewels. FLUOROSCOPY: Radiation Exposure Index (as provided by the fluoroscopic device): 22.0 mGy Kerma COMPARISON:  None Available. FINDINGS: Scout Radiograph: Within normal limits. Esophagus:  Normal appearance. 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 Gastroesophageal reflux:  None visualized. Ingested 70mm barium tablet:  Not given Stomach: Postoperative changes from hiatal hernia repair. No recurrent hernia. Prior CT scan shows a large right pleural space complex fluid collection likely a postoperative hematoma. Gastric emptying: Normal. Duodenum:  Normal appearance. Other:  None. IMPRESSION: 1.  No mass, stricture, or gastroesophageal reflux noted 2. 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  No hiatal hernia. Electronically Signed   By: Marijo Sanes M.D.   On: 04/01/2022 12:27   CT CHEST WO CONTRAST  Result Date: 03/31/2022 CLINICAL DATA:  Pneumonia, complication suspected. X-ray done. Status post hiatal hernia repair. Coughing with fluid retention in the hernia sac. Evaluation for better idea if there is more going on the simple effusion. EXAM: CT CHEST WITHOUT CONTRAST TECHNIQUE: Multidetector CT imaging of the chest was performed following the standard protocol without IV contrast. RADIATION DOSE REDUCTION: This exam was performed according to the departmental dose-optimization program which includes automated exposure control, adjustment of the mA and/or kV according to patient size and/or use of iterative reconstruction technique. COMPARISON:  Radiographs earlier today and CT abdomen and pelvis 03/24/2022 FINDINGS: Evaluation is limited by lack of IV contrast. Cardiovascular: Heart size upper limits of normal. Small pericardial effusion. Right PICC tip in the low SVC. Mediastinum/Nodes: No definite adenopathy contrast. Fluid level within the esophagus reaching near the thoracic inlet. The mid and distal esophagus are poorly visualized. Redemonstrated large hiatal hernia with intrathoracic stomach in the right hemithorax. Lungs/Pleura: Complete  collapse of the right middle and lower lobes with mucous plugging. Small-moderate  low-density right pleural effusion (Hounsfield units approximately 9). Small left pleural effusion and partial atelectasis of the left lower lobe with mucous plugging. Atelectasis in the lingula. Upper Abdomen: New surgical clips near the expected location of the gastroesophageal junction likely related to hernia repair on 03/27/2022. Partially visualized stranding and edema about the intraabdominal portion of the stomach. New herniation of the anterior left lobe of the liver along with fat and locules of gas into a subxiphoid hernia in the anterior chest/abdominal wall Musculoskeletal: No acute osseous abnormality. IMPRESSION: 1. Redemonstrated large hiatal hernia with intrathoracic portions of the stomach in the right hemithorax. 2. Complete collapse of the right middle and lower lobe and partial collapse of the left lower lobe with mucous plugging. This is favored due to aspiration given large hernia and fluid level in the esophagus. 3. Small-moderate right and small left pleural effusions are the density of simple fluid. 4. Marked stranding and edema in the upper abdomen about the intraabdominal portion of the stomach. Partial herniation of the right lobe of the liver along with fat and locules of gas into a subxiphoid hernia. These findings may be postoperative in nature however if there is concern for perforation, CT chest and abdomen with IV and oral contrast is recommended. This case was discussed by telephone at the time of interpretation on 03/31/2022 at 11:53 pm to provider Dr. Bobbye Morton, who verbally acknowledged these results. Electronically Signed   By: Placido Sou M.D.   On: 03/31/2022 23:54   DG CHEST PORT 1 VIEW  Result Date: 03/31/2022 CLINICAL DATA:  Pleural effusion.  Follow-up exam. EXAM: PORTABLE CHEST 1 VIEW COMPARISON:  03/28/2022. FINDINGS: Opacity obscures the lower half of the right hemithorax, unchanged from the prior exam. There is also opacity at the left lung base obscuring the  hemidiaphragm, also unchanged. Remainder of the lungs is clear. No pneumothorax. Right PICC stable and well positioned. IMPRESSION: 1. No significant change from the most recent prior exam. 2. Right mid to lower hemithorax opacity and left lung base opacity consistent with a combination of pleural effusions and atelectasis and/or pneumonia. No evidence of pulmonary edema. Electronically Signed   By: Lajean Manes M.D.   On: 03/31/2022 14:15    Anti-infectives: Anti-infectives (From admission, onward)    Start     Dose/Rate Route Frequency Ordered Stop   03/27/22 1600  ceFEPIme (MAXIPIME) 2 g in sodium chloride 0.9 % 100 mL IVPB        2 g 200 mL/hr over 30 Minutes Intravenous Every 12 hours 03/27/22 1501 04/01/22 0427   03/27/22 1600  metroNIDAZOLE (FLAGYL) IVPB 500 mg        500 mg 100 mL/hr over 60 Minutes Intravenous Every 12 hours 03/27/22 1501 04/01/22 0456   03/27/22 0600  ceFAZolin (ANCEF) IVPB 2g/100 mL premix        2 g 200 mL/hr over 30 Minutes Intravenous On call to O.R. 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 Kae Heller Laparoscopic lysis of adhesions x 75 minutes Laparoscopic Reduction of incarcerated giant paraesophageal hernia and detorsion of gastric volvulue Laparoscopic Wedge resection, fundus of stomach Laparoscopic primary repair of hiatal hernia Laparoscopic gastrostomy tube insertion Primary repair of umbilical hernia Upper endoscopy  - pt w/ productive cough and tachypnea 1/8 prompting CXR >> R pleural effusion/consolidation >>  CT chest ordered showing hiatal hernia and possible aspiration. But UGI looks fine so think this is mostly fluid, no evidence infection currently so will follow - place G tube to gravity -will give fulls today, half tpn and do ensure   FEN: g-tube clamped, fulls, half tpn, will plan to advance to soft as tolerated with ensure  for a couple weeks.  ID: cefepime/flagyl 1/4 >> continue for 5 days post-op due to spillage of gastric contents during surgery  VTE: SCD's, Lovenox Foley: D/C on POD #1 - [place] Dispo: med-surg   HTN - on norvasc at home, PRN IV metoprolol  GERD - PPI    Alyssa Peterson 04/02/2022

## 2022-04-02 NOTE — Progress Notes (Signed)
PHARMACY - TOTAL PARENTERAL NUTRITION CONSULT NOTE  Indication: Gastric outlet obstruction, hiatal hernia, intra-abdominal adhesions, gastric perforation  Patient Measurements: Height: 5\' 3"  (160 cm) Weight: 59 kg (130 lb) IBW/kg (Calculated) : 52.4 TPN AdjBW (KG): 59 Body mass index is 23.03 kg/m. Usual Weight: 60 kg  Assessment:  [name] presented w/ abdominal pain and vomiting. Known to have large hiatal hernia and gastric volvulus but new gastric outlet obstruction. S/p laparoscopic LoA, repair of hernia, and gastrostomy tube insertion. OR findings significant for extensive intra-abdominal adhesions, gastric perforation, and hepatomegaly. Pharmacy consulted to dose TPN.  1/10 TPN to 1/2 per surgery  Glucose / Insulin: no hx DM, [name] 5.9%.  CBGs 170-200 Used 12 units SSI in the past 24 hrs Electrolytes: refeeding - K 4 and Phos 2.9 s/p repletion, Mag 2.1, Na 134 Renal: SCr <1, BUN WNL Hepatic: LFTs / tbili / TG WNL, albumin 1.8 Intake / Output; MIVF: UOP not documented, drain 24mL, BM x5 GI Imaging: none since TPN start GI Surgeries / Procedures: none since TPN start  Central access: 03/28/22 TPN start date: 03/28/22  Nutritional Goals:  RD Estimated Needs Total Energy Estimated Needs: 1770-2065 kcals Total Protein Estimated Needs: 85-100 gm Total Fluid Estimated Needs: >/= 1.7 L  Current Nutrition:  TPN CLD started 1/8  Plan:  Decrease to 1/2 TPN rate to 35 ml/hr to provide 42g AA and 834 kCal, meeting 50% of needs Electrolytes in TPN: Na inc to 41mEq/L, K 30mEq/L, Ca 43mEq/L, Mg 63mEq/L, Phos 72mmol/L, Cl:Ac 1:1 Add standard MVI and trace elements to TPN Continue moderate SSI Q6H Monitor TPN labs daily until stable then on Mon/Thurs F/u diet advancement and toleration for further weaning  Bertis Ruddy, PharmD, [place] Pharmacist ED Pharmacist Phone # [phone] 04/02/2022 7:41 AM

## 2022-04-03 DIAGNOSIS — E43 Unspecified severe protein-calorie malnutrition: Secondary | ICD-10-CM | POA: Insufficient documentation

## 2022-04-03 LAB — CBC
HCT: 31.2 % — ABNORMAL LOW (ref 36.0–46.0)
Hemoglobin: 10.4 g/dL — ABNORMAL LOW (ref 12.0–15.0)
MCH: 29.4 pg (ref 26.0–34.0)
MCHC: 33.3 g/dL (ref 30.0–36.0)
MCV: 88.1 fL (ref 80.0–100.0)
Platelets: 219 10*3/uL (ref 150–400)
RBC: 3.54 MIL/uL — ABNORMAL LOW (ref 3.87–5.11)
RDW: 14.6 % (ref 11.5–15.5)
WBC: 13.3 10*3/uL — ABNORMAL HIGH (ref 4.0–10.5)
nRBC: 0.2 % (ref 0.0–0.2)

## 2022-04-03 LAB — COMPREHENSIVE METABOLIC PANEL
ALT: 19 U/L (ref 0–44)
AST: 19 U/L (ref 15–41)
Albumin: 1.8 g/dL — ABNORMAL LOW (ref 3.5–5.0)
Alkaline Phosphatase: 73 U/L (ref 38–126)
Anion gap: 8 (ref 5–15)
BUN: 9 mg/dL (ref 8–23)
CO2: 25 mmol/L (ref 22–32)
Calcium: 8.2 mg/dL — ABNORMAL LOW (ref 8.9–10.3)
Chloride: 103 mmol/L (ref 98–111)
Creatinine, Ser: 0.57 mg/dL (ref 0.44–1.00)
GFR, Estimated: >60 mL/min (ref >60)
Glucose, Bld: 125 mg/dL — ABNORMAL HIGH (ref 70–99)
Potassium: 4.2 mmol/L (ref 3.5–5.1)
Sodium: 136 mmol/L (ref 135–145)
Total Bilirubin: 0.9 mg/dL (ref 0.3–1.2)
Total Protein: 5 g/dL — ABNORMAL LOW (ref 6.5–8.1)

## 2022-04-03 LAB — MAGNESIUM: Magnesium: 2 mg/dL (ref 1.7–2.4)

## 2022-04-03 LAB — GLUCOSE, CAPILLARY
Glucose-Capillary: 131 mg/dL — ABNORMAL HIGH (ref 70–99)
Glucose-Capillary: 133 mg/dL — ABNORMAL HIGH (ref 70–99)
Glucose-Capillary: 135 mg/dL — ABNORMAL HIGH (ref 70–99)
Glucose-Capillary: 136 mg/dL — ABNORMAL HIGH (ref 70–99)

## 2022-04-03 LAB — PHOSPHORUS: Phosphorus: 3.5 mg/dL (ref 2.5–4.6)

## 2022-04-03 LAB — TRIGLYCERIDES: Triglycerides: 65 mg/dL (ref <150)

## 2022-04-03 MED ORDER — NEPRO/CARBSTEADY PO LIQD
237.0000 mL | Freq: Two times a day (BID) | ORAL | Status: DC
  Administered 2022-04-05 – 2022-04-11 (×10): 237 mL via ORAL

## 2022-04-03 MED ORDER — TRAVASOL 10 % IV SOLN
INTRAVENOUS | Status: AC
  Filled 2022-04-03: qty 420

## 2022-04-03 NOTE — Progress Notes (Addendum)
7 Days Post-Op   Subjective/Chief Complaint: Feels well.  Having bowel function.  Tolerated FLD yesterday.  Had soup and some pudding.  Did not get Ensure that was ordered for her.  Mobilizing with walker with mobility tech.   Objective: Vital signs in last 24 hours: Temp:  [98.4 F (36.9 C)-98.9 F (37.2 C)] 98.9 F (37.2 C) (01/11 0856) Pulse Rate:  [80-87] 87 (01/11 0856) Resp:  [16-20] 16 (01/11 0856) BP: (112-131)/(77-83) 120/80 (01/11 0856) SpO2:  [95 %-98 %] 98 % (01/11 0856) Weight:  [61 kg] 61 kg (01/11 0500) Last BM Date : 04/02/22  Intake/Output from previous day: 01/10 0701 - 01/11 0700 In: 1768.6 [I.V.:1768.6] Out: -  Intake/Output this shift: No intake/output data recorded.  NAD Resp - effort normal, no tachypnea, clear Abd - soft, nondistended, minimal tenderness, G tube clamped Incisions c/d/I    Lab Results:  Recent Labs    04/01/22 0739 04/03/22 0330  WBC 10.3 13.3*  HGB 10.9* 10.4*  HCT 33.2* 31.2*  PLT 166 219   BMET Recent Labs    04/02/22 0310 04/03/22 0330  NA 134* 136  K 4.0 4.2  CL 104 103  CO2 25 25  GLUCOSE 207* 125*  BUN 11 9  CREATININE 0.55 0.57  CALCIUM 8.1* 8.2*   PT/INR No results for input(s): "LABPROT", "INR" in the last 72 hours. ABG No results for input(s): "PHART", "HCO3" in the last 72 hours.  Invalid input(s): "PCO2", "PO2"  Studies/Results: DG UGI W SINGLE CM (SOL OR THIN BA)  Result Date: 04/01/2022 CLINICAL DATA:  Patient with incarcerated paraesophageal hernia status post lysis of adhesions, hernia repair, gastrostomy tube insertion on January 4th 2024. Request is for single upper GI for further evaluation possible hernia EXAM: DG UGI W SINGLE CM TECHNIQUE: Scout radiograph was obtained. Single contrast examination was performed using thin liquid barium. This exam was performed by Rushie Nyhan, and was supervised and interpreted by Dr. Kalman Jewels. FLUOROSCOPY: Radiation Exposure Index (as provided  by the fluoroscopic device): 22.0 mGy Kerma COMPARISON:  None Available. FINDINGS: Scout Radiograph: Within normal limits. Esophagus:  Normal appearance. 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 Gastroesophageal reflux:  None visualized. Ingested 18mm barium tablet:  Not given Stomach: Postoperative changes from hiatal hernia repair. No recurrent hernia. Prior CT scan shows a large right pleural space complex fluid collection likely a postoperative hematoma. Gastric emptying: Normal. Duodenum:  Normal appearance. Other:  None. IMPRESSION: 1.  No mass, stricture, or gastroesophageal reflux noted 2. 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  No hiatal hernia. Electronically Signed   By: Marijo Sanes M.D.   On: 04/01/2022 12:27    Anti-infectives: Anti-infectives (From admission, onward)    Start     Dose/Rate Route Frequency Ordered Stop   03/27/22 1600  ceFEPIme (MAXIPIME) 2 g in sodium chloride 0.9 % 100 mL IVPB        2 g 200 mL/hr over 30 Minutes Intravenous Every 12 hours 03/27/22 1501 04/01/22 0427   03/27/22 1600  metroNIDAZOLE (FLAGYL) IVPB 500 mg        500 mg 100 mL/hr over 60 Minutes Intravenous Every 12 hours 03/27/22 1501 04/01/22 0456   03/27/22 0600  ceFAZolin (ANCEF) IVPB 2g/100 mL premix        2 g 200 mL/hr over 30 Minutes Intravenous On call to O.R. 03/26/22 0845 03/27/22 0935       Assessment/Plan: POD 7, Chronically incarcerated paraesophageal  hernia with gastric volvulus  S/P below procedures 1/4 Dr. Kae Heller Laparoscopic lysis of adhesions x 75 minutes Laparoscopic Reduction of incarcerated giant paraesophageal hernia and detorsion of gastric volvulue Laparoscopic Wedge resection, fundus of stomach Laparoscopic primary repair of hiatal hernia Laparoscopic gastrostomy tube insertion Primary repair of umbilical hernia Upper endoscopy  - pt w/ productive cough and tachypnea 1/8  prompting CXR >> R pleural effusion/consolidation >> CT chest ordered showing hiatal hernia and possible aspiration. But UGI looks fine so think this is mostly fluid, no evidence infection currently so will follow -WBC slightly up today to 13K.  Will closely monitor this.  Patient looks well and completely asymptomatic of any illness -adv to soft diet today, cont BID Ensure.  If she tolerates this well today, will adv to TID Ensure tomorrow -cont 1/2 rate TNA.  If oral intake increases today, will look to stop TNA over the next 1-2 days. -will need to remain on soft diet for at least 2 weeks post op. -ambulating with mobility tech 300 feet with walker.  Will have PT eval to assure no recs for home    FEN: g-tube clamped, soft, half tpn, Ensure ID: cefepime/flagyl 1/4 >> continue for 5 days post-op due to spillage of gastric contents during surgery  VTE: SCD's, Lovenox Foley: D/C on POD #1  Dispo: med-surg   HTN - on norvasc at home, PRN IV metoprolol  GERD - PPI    Henreitta Cea, PA-C 04/03/2022

## 2022-04-03 NOTE — Progress Notes (Signed)
Mobility Specialist - Progress Note   04/03/22 1522  Mobility  Activity Ambulated with assistance in hallway  Level of Assistance Standby assist, set-up cues, supervision of patient - no hands on  Assistive Device Front wheel walker  Distance Ambulated (ft) 200 ft  Activity Response Tolerated well  Mobility Referral Yes  $Mobility charge 1 Mobility    Pt received in bed agreeable to mobility. No complaints throughout. Left in bed w/ all needs met.  [place] Specialist Please contact via SecureChat or Rehab office at [phone]

## 2022-04-03 NOTE — Evaluation (Signed)
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  PMH positive for prior hernia repair and gastrorrhaphy 07/04/20, and L femoral hernia repair 06/2016.  Clinical Impression  Patient mobilizing well with RW and has been doing well with assistance of mobility specialists.  Previously not using assistive device, but feel she will work her way back to no device once nutrition is back to normal and she is healed from the surgery.  Did suggest she lay flat some in the hospital bed as tolerated to allow incision to heal without losing ROM.  She was able to negotiate steps multiple techniques appropriate for home entry and will have assistance.  PT will sign off as no further follow up needs and ambulating with mobility specialist.        Recommendations for follow up therapy are one component of a multi-disciplinary discharge planning process, led by the attending physician.  Recommendations may be updated based on patient status, additional functional criteria and insurance authorization.  Follow Up Recommendations No PT follow up      Assistance Recommended at Discharge    Patient can return home with the following  Assistance with cooking/housework;Assist for transportation;Help with stairs or ramp for entrance    Equipment Recommendations None recommended by PT  Recommendations for Other Services       Functional Status Assessment Patient has had a recent decline in their functional status and demonstrates the ability to make significant improvements in function in a reasonable and predictable amount of time.     Precautions / Restrictions Precautions Precautions:  Fall      Mobility  Bed Mobility Overal bed mobility: Modified Independent             General bed mobility comments: used good technique for abdominal incision protection going down to flat bed sidelying    Transfers Overall transfer level: Modified independent Equipment used: Rolling walker (2 wheels)                    Ambulation/Gait Ambulation/Gait assistance: Modified independent (Device/Increase time) Gait Distance (Feet): 150 Feet Assistive device: Rolling walker (2 wheels) Gait Pattern/deviations: Step-through pattern, Trunk flexed       General Gait Details: mild trunk flexion, assisted with IV pole only  Stairs Stairs: Yes Stairs assistance: Min assist Stair Management: No rails, Step to pattern, Forwards, Backwards, With walker (hand hold A) Number of Stairs: 3 (x 2) General stair comments: performed first going forward with hand hold A only, then performed second time with RW and reverse technique assist for walker stabilization.  Wheelchair Mobility    Modified Rankin (Stroke Patients Only)       Balance Overall balance assessment: Needs assistance   Sitting balance-Leahy Scale: Good       Standing balance-Leahy Scale: Good Standing balance comment: using walker for ambulation in hallway, able to stand statically and move some over BOS without UE support                             Pertinent Vitals/Pain Pain Assessment Pain Assessment: No/denies pain    Home Living Family/patient expects to be discharged to:: Private residence Living Arrangements: Spouse/significant  other Available Help at Discharge: Family;Available PRN/intermittently Type of Home: House Home Access: Stairs to enter Entrance Stairs-Rails: None Entrance Stairs-Number of Steps: 3   Home Layout: One level Home Equipment: Grab bars - tub/shower;Rolling Walker (2 wheels)      Prior Function Prior Level of Function : Independent/Modified  Independent               ADLs Comments: cooking, cleaning, driving, etc; works at [employer]: Right    Extremity/Trunk Assessment   Upper Extremity Assessment Upper Extremity Assessment: Overall WFL for tasks assessed    Lower Extremity Assessment Lower Extremity Assessment: Overall WFL for tasks assessed       Communication   Communication: No difficulties  Cognition Arousal/Alertness: Awake/alert Behavior During Therapy: WFL for tasks assessed/performed Overall Cognitive Status: Within Functional Limits for tasks assessed                                          General Comments      Exercises     Assessment/Plan    PT Assessment Patient does not need any further PT services  PT Problem List         PT Treatment Interventions      PT Goals (Current goals can be found in the Care Plan section)  Acute Rehab PT Goals PT Goal Formulation: All assessment and education complete, DC therapy    Frequency       Co-evaluation               AM-PAC PT "6 Clicks" Mobility  Outcome Measure Help needed turning from your back to your side while in a flat bed without using bedrails?: None Help needed moving from lying on your back to sitting on the side of a flat bed without using bedrails?: None Help needed moving to and from a bed to a chair (including a wheelchair)?: A Little Help needed standing up from a chair using your arms (e.g., wheelchair or bedside chair)?: None Help needed to walk in hospital room?: None Help needed climbing 3-5 steps with a railing? : A Little 6 Click Score: 22    End of Session   Activity Tolerance: Patient tolerated treatment well Patient left: in bed;with call bell/phone within reach   PT Visit Diagnosis: Muscle weakness (generalized) (M62.81)    Time: 0092-3300 PT Time Calculation (min) (ACUTE ONLY): 29 min   Charges:   PT Evaluation $PT Eval Low Complexity: 1  Low PT Treatments $Gait Training: 8-22 mins        Magda Kiel, PT Acute Rehabilitation Services [phone] 04/03/2022   Reginia Naas 04/03/2022, 5:36 PM

## 2022-04-03 NOTE — Progress Notes (Signed)
PHARMACY - TOTAL PARENTERAL NUTRITION CONSULT NOTE  Indication: Gastric outlet obstruction, hiatal hernia, intra-abdominal adhesions, gastric perforation  Patient Measurements: Height: 5\' 3"  (160 cm) Weight: 61 kg (134 lb 7.7 oz) IBW/kg (Calculated) : 52.4 TPN AdjBW (KG): 59 Body mass index is 23.82 kg/m. Usual Weight: 60 kg  Assessment:  [name] presented w/ abdominal pain and vomiting. Known to have large hiatal hernia and gastric volvulus but new gastric outlet obstruction. S/p laparoscopic LoA, repair of hernia, and gastrostomy tube insertion. OR findings significant for extensive intra-abdominal adhesions, gastric perforation, and hepatomegaly. Pharmacy consulted to dose TPN.  Glucose / Insulin: no hx DM, [name] 5.9%.  CBGs controlled with 1/2 rate TPN. Used 9 units SSI in the past 24 hrs Electrolytes: all WNL Renal: SCr <1, BUN WNL Hepatic: LFTs / tbili / TG WNL, albumin 1.8 Intake / Output; MIVF: UOP not documented (6 occurrences), BM x1 GI Imaging:  1/9 UGI - mod to severe esophageal dysmotility  GI Surgeries / Procedures: none since TPN start  Central access: 03/28/22 TPN start date: 03/28/22  Nutritional Goals:  RD Estimated Needs Total Energy Estimated Needs: 1700-1900 Total Protein Estimated Needs: 85-100g Total Fluid Estimated Needs: >/=1.7L  Current Nutrition:  TPN Soft diet started 1/11 Ensure BID - 2 charted given yesterday  Plan:  Continue half TPN and advance to soft diet per Surgery Continue TPN at 35 ml/hr (goal rate 75 ml/hr) to provide 42g AA and 834 kCal, meeting 50% of needs Electrolytes in TPN: Na increased to 32mEq/L on 1/10, K 27mEq/L, Ca 3mEq/L, Mg 55mEq/L, Phos 88mmol/L, Cl:Ac 1:1 Add standard MVI and trace elements to TPN Continue moderate SSI Q6H Monitor TPN labs on Mon/Thurs - labs in AM given TPN rate reduction and less lytes in TPN F/u PO intake to stop TPN  Alyssa Peterson, PharmD, BCPS, [place] 04/03/2022, 9:14 AM

## 2022-04-04 ENCOUNTER — Inpatient Hospital Stay (HOSPITAL_COMMUNITY): Payer: Medicare Other

## 2022-04-04 LAB — MAGNESIUM: Magnesium: 1.9 mg/dL (ref 1.7–2.4)

## 2022-04-04 LAB — BASIC METABOLIC PANEL
Anion gap: 6 (ref 5–15)
BUN: 10 mg/dL (ref 8–23)
CO2: 25 mmol/L (ref 22–32)
Calcium: 8.2 mg/dL — ABNORMAL LOW (ref 8.9–10.3)
Chloride: 103 mmol/L (ref 98–111)
Creatinine, Ser: 0.63 mg/dL (ref 0.44–1.00)
GFR, Estimated: >60 mL/min (ref >60)
Glucose, Bld: 140 mg/dL — ABNORMAL HIGH (ref 70–99)
Potassium: 3.9 mmol/L (ref 3.5–5.1)
Sodium: 134 mmol/L — ABNORMAL LOW (ref 135–145)

## 2022-04-04 LAB — CBC
HCT: 31.8 % — ABNORMAL LOW (ref 36.0–46.0)
Hemoglobin: 10.6 g/dL — ABNORMAL LOW (ref 12.0–15.0)
MCH: 29.4 pg (ref 26.0–34.0)
MCHC: 33.3 g/dL (ref 30.0–36.0)
MCV: 88.1 fL (ref 80.0–100.0)
Platelets: 273 10*3/uL (ref 150–400)
RBC: 3.61 MIL/uL — ABNORMAL LOW (ref 3.87–5.11)
RDW: 14.7 % (ref 11.5–15.5)
WBC: 14.7 10*3/uL — ABNORMAL HIGH (ref 4.0–10.5)
nRBC: 0 % (ref 0.0–0.2)

## 2022-04-04 LAB — GLUCOSE, CAPILLARY
Glucose-Capillary: 109 mg/dL — ABNORMAL HIGH (ref 70–99)
Glucose-Capillary: 129 mg/dL — ABNORMAL HIGH (ref 70–99)
Glucose-Capillary: 137 mg/dL — ABNORMAL HIGH (ref 70–99)
Glucose-Capillary: 155 mg/dL — ABNORMAL HIGH (ref 70–99)
Glucose-Capillary: 163 mg/dL — ABNORMAL HIGH (ref 70–99)
Glucose-Capillary: 89 mg/dL (ref 70–99)

## 2022-04-04 LAB — PHOSPHORUS: Phosphorus: 3.1 mg/dL (ref 2.5–4.6)

## 2022-04-04 MED ORDER — IOHEXOL 9 MG/ML PO SOLN: 500.0000 mL | ORAL | Status: AC

## 2022-04-04 MED ORDER — MIDAZOLAM HCL 2 MG/2ML IJ SOLN
INTRAMUSCULAR | Status: AC | PRN
  Administered 2022-04-04: 1 mg via INTRAVENOUS

## 2022-04-04 MED ORDER — TRAVASOL 10 % IV SOLN
INTRAVENOUS | Status: DC
  Filled 2022-04-04: qty 420

## 2022-04-04 MED ORDER — FENTANYL CITRATE (PF) 100 MCG/2ML IJ SOLN
INTRAMUSCULAR | Status: AC | PRN
  Administered 2022-04-04: 50 ug via INTRAVENOUS

## 2022-04-04 MED ORDER — LIDOCAINE HCL (PF) 1 % IJ SOLN
10.0000 mL | Freq: Once | INTRAMUSCULAR | Status: AC
  Administered 2022-04-04: 10 mL

## 2022-04-04 MED ORDER — MIDAZOLAM HCL 2 MG/2ML IJ SOLN
INTRAMUSCULAR | Status: AC
  Filled 2022-04-04: qty 2

## 2022-04-04 MED ORDER — IOHEXOL 350 MG/ML SOLN
75.0000 mL | Freq: Once | INTRAVENOUS | Status: AC | PRN
  Administered 2022-04-04: 75 mL via INTRAVENOUS

## 2022-04-04 MED ORDER — IOHEXOL 9 MG/ML PO SOLN
500.0000 mL | ORAL | Status: AC
  Administered 2022-04-04 (×2): 500 mL via ORAL

## 2022-04-04 MED ORDER — FENTANYL CITRATE (PF) 100 MCG/2ML IJ SOLN
INTRAMUSCULAR | Status: AC
  Filled 2022-04-04: qty 2

## 2022-04-04 NOTE — Progress Notes (Signed)
8 Days Post-Op   Subjective/Chief Complaint: Feels well.  Having bowel function.  Tolerated soft yesterday and had one Ensure as well. No nausea or vomiting.  No new abdominal pain.  No dysuria, no cough, no fever   Objective: Vital signs in last 24 hours: Temp:  [98 F (36.7 C)-98.7 F (37.1 C)] 98.5 F (36.9 C) (01/12 0752) Pulse Rate:  [83-84] 83 (01/12 0752) Resp:  [17] 17 (01/12 0752) BP: (108-128)/(80-82) 108/81 (01/12 0752) SpO2:  [97 %-98 %] 98 % (01/12 0752) Last BM Date : 04/04/22  Intake/Output from previous day: 01/11 0701 - 01/12 0700 In: 1551.2 [P.O.:440; I.V.:1111.2] Out: -  Intake/Output this shift: No intake/output data recorded.  NAD Resp - effort normal, no tachypnea, clear Abd - soft, nondistended, minimal tenderness, G tube clamped Incisions c/d/I    Lab Results:  Recent Labs    04/03/22 0330 04/04/22 0230  WBC 13.3* 14.7*  HGB 10.4* 10.6*  HCT 31.2* 31.8*  PLT 219 273   BMET Recent Labs    04/03/22 0330 04/04/22 0230  NA 136 134*  K 4.2 3.9  CL 103 103  CO2 25 25  GLUCOSE 125* 140*  BUN 9 10  CREATININE 0.57 0.63  CALCIUM 8.2* 8.2*   PT/INR No results for input(s): "LABPROT", "INR" in the last 72 hours. ABG No results for input(s): "PHART", "HCO3" in the last 72 hours.  Invalid input(s): "PCO2", "PO2"  Studies/Results: No results found.  Anti-infectives: Anti-infectives (From admission, onward)    Start     Dose/Rate Route Frequency Ordered Stop   03/27/22 1600  ceFEPIme (MAXIPIME) 2 g in sodium chloride 0.9 % 100 mL IVPB        2 g 200 mL/hr over 30 Minutes Intravenous Every 12 hours 03/27/22 1501 04/01/22 0427   03/27/22 1600  metroNIDAZOLE (FLAGYL) IVPB 500 mg        500 mg 100 mL/hr over 60 Minutes Intravenous Every 12 hours 03/27/22 1501 04/01/22 0456   03/27/22 0600  ceFAZolin (ANCEF) IVPB 2g/100 mL premix        2 g 200 mL/hr over 30 Minutes Intravenous On call to O.R. 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 Kae Heller Laparoscopic lysis of adhesions x 75 minutes Laparoscopic Reduction of incarcerated giant paraesophageal hernia and detorsion of gastric volvulue Laparoscopic Wedge resection, fundus of stomach Laparoscopic primary repair of hiatal hernia Laparoscopic gastrostomy tube insertion Primary repair of umbilical hernia Upper endoscopy  - pt w/ productive cough and tachypnea 1/8 prompting CXR >> R pleural effusion/consolidation >> CT chest ordered showing hiatal hernia and possible aspiration. But UGI looks fine so think this is mostly fluid, no evidence infection currently so will follow  -WBC slightly up today to 14K.  Despite no infectious symptoms, given contamination and fluid noted in chest as above, will CT CAP today. -cont soft diet today, cont BID Ensure.   -DC TNA today -will need to remain on soft diet for at least 2 weeks post op. -ambulating with mobility tech 300 feet with walker. No PT follow up   FEN: g-tube clamped, soft, TNA off, Ensure ID: cefepime/flagyl 1/4 >> continue for 5 days post-op due to spillage of gastric contents during surgery  VTE: SCD's, Lovenox Foley: D/C on POD #1  Dispo: med-surg   HTN - on norvasc at home, PRN IV metoprolol  GERD - PPI  Henreitta Cea, PA-C 04/04/2022

## 2022-04-04 NOTE — Progress Notes (Signed)
Mobility Specialist - Progress Note   04/04/22 1314  Mobility  Activity Transferred to/from [facility]  Level of Assistance Minimal assist, patient does 75% or more  Assistive Device BSC  Distance Ambulated (ft) 2 ft  Activity Response Tolerated well  Mobility Referral Yes  $Mobility charge 1 Mobility    Pt received in bed requesting to use BSC. Declined hallway ambulation d/t upcoming procedure. MinA to move BLE into bed. Left in bed w/ call bell in reach and all needs met.   [place] Specialist Please contact via SecureChat or Rehab office at [phone]

## 2022-04-04 NOTE — Progress Notes (Signed)
First Omnipaque oral solution 9mg  iodine/ml given to patient

## 2022-04-04 NOTE — Procedures (Signed)
Interventional Radiology Procedure Note  Procedure: CT DRAIN PELVIC POST OP COLLECTION    Complications: None  Estimated Blood Loss:  MIN  Findings: BLOOD TINGED THIN EXUDATIVE FLD CX SENT    Tamera Punt, MD

## 2022-04-04 NOTE — Progress Notes (Signed)
Second Omnipaque oral solution 9mg  iodine/ml given to patient

## 2022-04-04 NOTE — Progress Notes (Signed)
Chief Complaint: Patient was seen in consultation today for abdominal fluid collection   Referring Physician(s): Barnetta Chapel PA-C  Supervising Physician: Ruel Favors  Patient Status: [facility] - In-pt  History of Present Illness: Alyssa Peterson is a 72 y.o. female who underwent complex hiatal hernia repair about 8 days ago. She has been recovering pretty well. Has developed mild rise in WBC past 2 days. CT was performed today showing pelvic fluid collection with rim enhancement. It also finds collection in chest felt to represent fluid/hematoma in residual hernia sac on the right side of the chest. Prior imaging reviewed from 1/8 and this appears stable and consistent with hematoma. UGI was performed showing no evidence of leak.  IR is asked to eval for perc drainage.  PMHx, meds, labs, imaging, allergies reviewed. Feels well otherwise Has been NPO today    Past Medical History:  Diagnosis Date   Anemia    GERD (gastroesophageal reflux disease)    Hypertension    Inguinal hernia    left    Past Surgical History:  Procedure Laterality Date   CYST REMOVAL NECK     DENTAL SURGERY     FEMORAL HERNIA REPAIR Left 07/17/2016   Procedure: LEFT FEMORAL  HERNIA REPAIR WITH MESH;  Surgeon: Manus Rudd, MD;  Location: [facility];  Service: General;  Laterality: Left;   GASTROJEJUNOSTOMY N/A 07/04/2020   Procedure: GASTROSTOMY TUBE;  Surgeon: Berna Bue, MD;  Location: [facility] OR;  Service: General;  Laterality: N/A;   GASTROSTOMY N/A 03/27/2022   Procedure: INSERTION OF GASTROSTOMY TUBE;  Surgeon: Berna Bue, MD;  Location: [facility] OR;  Service: General;  Laterality: N/A;   HIATAL HERNIA REPAIR N/A 03/27/2022   Procedure: LAPAROSCOPIC REPAIR OF HIATAL HERNIA;  Surgeon: Berna Bue, MD;  Location: [facility] OR;  Service: General;  Laterality: N/A;   LAPAROSCOPIC LYSIS OF ADHESIONS N/A 03/27/2022   Procedure: LAPAROSCOPIC LYSIS OF ADHESIONS;  Surgeon: Berna Bue, MD;  Location: [facility] OR;  Service: General;  Laterality: N/A;   LAPAROTOMY N/A 07/04/2020   Procedure: EXPLORATORY LAPAROTOMY;  Surgeon: Berna Bue, MD;  Location: [facility] OR;  Service: General;  Laterality: N/A;    Allergies: Latex and Penicillins  Medications:  Current Facility-Administered Medications:    0.9 %  sodium chloride infusion, , Intravenous, Continuous, Dang, Thuy D, RPH, Last Rate: 20 mL/hr at 03/31/22 1748, New Bag at 03/31/22 1748   acetaminophen (TYLENOL) tablet 650 mg, 650 mg, Oral, Q6H PRN **OR** acetaminophen (TYLENOL) suppository 650 mg, 650 mg, Rectal, Q6H PRN, Fredricka Bonine, Chelsea A, MD   albuterol (PROVENTIL) (2.5 MG/3ML) 0.083% nebulizer solution 2.5 mg, 2.5 mg, Nebulization, Q6H PRN, Manus Rudd, MD   amLODipine (NORVASC) tablet 10 mg, 10 mg, Oral, QHS, Emelia Loron, MD, 10 mg at 04/03/22 2145   Chlorhexidine Gluconate Cloth 2 % PADS 6 each, 6 each, Topical, Daily, Phylliss Blakes A, MD, 6 each at 04/04/22 0941   diphenhydrAMINE (BENADRYL) 12.5 MG/5ML elixir 12.5 mg, 12.5 mg, Oral, Q6H PRN **OR** diphenhydrAMINE (BENADRYL) injection 12.5 mg, 12.5 mg, Intravenous, Q6H PRN, Fredricka Bonine, Chelsea A, MD   enoxaparin (LOVENOX) injection 40 mg, 40 mg, Subcutaneous, Q24H, Connor, Chelsea A, MD, 40 mg at 04/03/22 1305   feeding supplement (NEPRO CARB STEADY) liquid 237 mL, 237 mL, Oral, BID BM, Barnetta Chapel, PA-C   HYDROmorphone (DILAUDID) injection 0.5 mg, 0.5 mg, Intravenous, Q4H PRN, Emelia Loron, MD   influenza vaccine adjuvanted (FLUAD) injection 0.5 mL, 0.5 mL, Intramuscular,  Prior to discharge, Quintella Baton, MD   insulin aspart (novoLOG) injection 0-15 Units, 0-15 Units, Subcutaneous, Q6H, Dang, Thuy D, RPH, 2 Units at 04/04/22 1248   latanoprost (XALATAN) 0.005 % ophthalmic solution 1 drop, 1 drop, Both Eyes, QHS, Saverio Danker, PA-C, 1 drop at 04/03/22 2152   metoCLOPramide (REGLAN) injection 10 mg, 10 mg, Intravenous, Q6H, Connor, Chelsea A, MD,  10 mg at 04/04/22 1246   metoprolol tartrate (LOPRESSOR) injection 5 mg, 5 mg, Intravenous, Q6H PRN, Romana Juniper A, MD   ondansetron (ZOFRAN-ODT) disintegrating tablet 4 mg, 4 mg, Oral, Q6H PRN **OR** ondansetron (ZOFRAN) injection 4 mg, 4 mg, Intravenous, Q6H PRN, Romana Juniper A, MD, 4 mg at 03/26/22 2202   oxyCODONE (Oxy IR/ROXICODONE) immediate release tablet 5 mg, 5 mg, Oral, Q4H PRN, Romana Juniper A, MD, 5 mg at 04/02/22 1104   pantoprazole (PROTONIX) injection 40 mg, 40 mg, Intravenous, Q12H, Romana Juniper A, MD, 40 mg at 04/04/22 0949   pneumococcal 20-valent conjugate vaccine (PREVNAR 20) injection 0.5 mL, 0.5 mL, Intramuscular, Prior to discharge, Romana Juniper A, MD   sodium chloride flush (NS) 0.9 % injection 10-40 mL, 10-40 mL, Intracatheter, Q12H, Romana Juniper A, MD, 10 mL at 04/04/22 0949   sodium chloride flush (NS) 0.9 % injection 10-40 mL, 10-40 mL, Intracatheter, PRN, Clovis Riley, MD   TPN ADULT (ION), , Intravenous, Continuous TPN, Tyrone Apple, RPH, Last Rate: 35 mL/hr at 04/04/22 0219, Infusion Verify at 04/04/22 9983    History reviewed. No pertinent family history.  Social History   Socioeconomic History   Marital status: Divorced    Spouse name: Not on file   Number of children: Not on file   Years of education: Not on file   Highest education level: Not on file  Occupational History   Not on file  Tobacco Use   Smoking status: Never   Smokeless tobacco: Never  Vaping Use   Vaping Use: Never used  Substance and Sexual Activity   Alcohol use: No    Alcohol/week: 0.0 standard drinks of alcohol   Drug use: No   Sexual activity: Not on file  Other Topics Concern   Not on file  Social History Narrative   Not on file   Social Determinants of Health   Financial Resource Strain: Not on file  Food Insecurity: No Food Insecurity (03/24/2022)   Hunger Vital Sign    Worried About Running Out of Food in the Last Year: Never true    Ran Out of  Food in the Last Year: Never true  Transportation Needs: No Transportation Needs (03/24/2022)   PRAPARE - Hydrologist (Medical): No    Lack of Transportation (Non-Medical): No  Physical Activity: Not on file  Stress: Not on file  Social Connections: Not on file    Review of Systems: A 12 point ROS discussed and pertinent positives are indicated in the HPI above.  All other systems are negative.  Review of Systems  Vital Signs: BP 108/81 (BP Location: Right Arm)   Pulse 83   Temp 98.5 F (36.9 C) (Oral)   Resp 17   Ht 5\' 3"  (1.6 m)   Wt 134 lb 7.7 oz (61 kg)   SpO2 98%   BMI 23.82 kg/m   Physical Exam Constitutional:      Appearance: She is not ill-appearing or toxic-appearing.  HENT:     Mouth/Throat:     Mouth: Mucous membranes are moist.  Pharynx: Oropharynx is clear.  Cardiovascular:     Rate and Rhythm: Normal rate and regular rhythm.  Pulmonary:     Effort: Pulmonary effort is normal. No respiratory distress.     Breath sounds: Normal breath sounds.  Abdominal:     General: There is no distension.     Palpations: Abdomen is soft.     Tenderness: There is no abdominal tenderness.  Skin:    General: Skin is warm and dry.  Neurological:     General: No focal deficit present.     Mental Status: She is alert and oriented to person, place, and time.     Imaging: CT CHEST ABDOMEN PELVIS W CONTRAST  Addendum Date: 04/04/2022   ADDENDUM REPORT: 04/04/2022 11:30 ADDENDUM: Correction: IMPRESSION: 1. A 11.0 x 9.1 centimeters rim enhancing fluid collection in the RIGHT mid chest, consistent with isolated hernia sac. Locules of gas are identified within this collection. These results will be called to the ordering clinician or representative by the Radiologist Assistant, and communication documented in the PACS or [employer]. Electronically Signed   By: Nolon Nations M.D.   On: 04/04/2022 11:30   Result Date: 04/04/2022 CLINICAL  DATA:  Rule out intra-abdominal abscess. Re-evaluate fluid collection in the chest from hiatal hernia sac. Patient had hernia repair, gastrostomy tube insertion, lysis of adhesions for incarcerated paraesophageal hernia on 03/27/2022. EXAM: CT CHEST, ABDOMEN, AND PELVIS WITH CONTRAST TECHNIQUE: Multidetector CT imaging of the chest, abdomen and pelvis was performed following the standard protocol during bolus administration of intravenous contrast. RADIATION DOSE REDUCTION: This exam was performed according to the departmental dose-optimization program which includes automated exposure control, adjustment of the mA and/or kV according to patient size and/or use of iterative reconstruction technique. CONTRAST:  31mL OMNIPAQUE IOHEXOL 350 MG/ML SOLN COMPARISON:  CT of the abdomen and pelvis on 03/24/2022, UPPER GI on 04/01/2022 and CT chest on 03/31/2022 FINDINGS: CT CHEST FINDINGS Cardiovascular: Heart size is normal. No significant coronary artery calcifications or atherosclerosis of the thoracic aorta. Patient has RIGHT PICC line, tip to the superior vena cava. Mediastinum/Nodes: Scattered calcifications and small low-attenuation lesions are identified within the thyroid, largest measuring 7 millimeters in the LEFT lobe (image 31 of series 3). Esophagus is unremarkable. Oral contrast empties into the stomach. Lungs/Pleura: Within the RIGHT mid chest, there is a rim enhancing fluid collection containing locules of gas, consistent with isolated hernia sac. Collection measures 11.0 x 9.1 centimeters on image 33 of series 3. Locules of gas are identified within this collection. In addition, there is RIGHT pleural effusion similar in volume compared to prior study. RIGHT LOWER and RIGHT middle lobe consolidation/atelectasis again noted. There is no pneumothorax. Airways are patent. Musculoskeletal: No chest wall mass or suspicious bone lesions identified. CT ABDOMEN PELVIS FINDINGS Hepatobiliary: Benign hepatic cyst  measuring 1.4 centimeters in the RIGHT hepatic dome. No further imaging is recommended. Otherwise the liver is unremarkable. Gallbladder is present. Pancreas: Unremarkable. No pancreatic ductal dilatation or surrounding inflammatory changes. Spleen: Normal in size without focal abnormality. Adrenals/Urinary Tract: Adrenal glands are normal. Kidneys are symmetric in size and enhancement/excretion. No hydronephrosis. Ureters are unremarkable. The bladder and visualized portion of the urethra are normal. Stomach/Bowel: The gastroesophageal junction is patent. There are surgical clips along the proximal greater curvature of the stomach. There is postoperative thickening of the proximal stomach wall. No residual hernia is identified. Interval placement of gastrostomy tube in the appropriate location. Small bowel loops are normal in appearance. There are  numerous colonic diverticula, many of which contain contrast. There is mild mesenteric edema, limiting evaluation for acute diverticulitis, though none is seen. The appendix is well seen and normal in appearance. Vascular/Lymphatic: No significant vascular findings are present. No enlarged abdominal or pelvic lymph nodes. Reproductive: Uterus is present and contains numerous fibroids. There has been interval development rim enhancing pelvic fluid collections. The largest is in the cul-de-sac and extends into the RIGHT adnexal region, measuring 7.9 x 3.8 centimeters. A second in the LEFT adnexal region is 2.9 x 2.7 centimeters on image 97 of series 3. This collection is posterior to the LEFT ovary. Other: Postoperative changes in the anterior abdominal wall. Mild body wall edema. Musculoskeletal: Mild degenerative changes in the lumbosacral spine. IMPRESSION: 1. A 1.0 x 9.1 centimeters rim enhancing fluid collection in the RIGHT mid chest, consistent with isolated hernia sac. Locules of gas are identified within this collection. 2. Stable RIGHT pleural effusion and RIGHT  LOWER lobe and RIGHT middle lobe consolidation/atelectasis. 3. Interval placement of gastrostomy tube in appropriate location. 4. Interval development of rim enhancing pelvic fluid collections, largest in the cul-de-sac and extends into the RIGHT adnexal region, measuring 7.9 x 3.8 centimeters. Findings are suspicious for abscesses. 5. Colonic diverticulosis. 6. Fibroid uterus. 7. Mild body wall edema. 8. Subcentimeter incidental left thyroid nodule. No follow-up imaging is recommended. Reference: J Am Coll Radiol. 2015 Feb;12(2): 143-50 Electronically Signed: By: Nolon Nations M.D. On: 04/04/2022 11:19   DG UGI W SINGLE CM (SOL OR THIN BA)  Result Date: 04/01/2022 CLINICAL DATA:  Patient with incarcerated paraesophageal hernia status post lysis of adhesions, hernia repair, gastrostomy tube insertion on January 4th 2024. Request is for single upper GI for further evaluation possible hernia EXAM: DG UGI W SINGLE CM TECHNIQUE: Scout radiograph was obtained. Single contrast examination was performed using thin liquid barium. This exam was performed by Rushie Nyhan, and was supervised and interpreted by Dr. Kalman Jewels. FLUOROSCOPY: Radiation Exposure Index (as provided by the fluoroscopic device): 22.0 mGy Kerma COMPARISON:  None Available. FINDINGS: Scout Radiograph: Within normal limits. Esophagus:  Normal appearance. 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 Gastroesophageal reflux:  None visualized. Ingested 12mm barium tablet:  Not given Stomach: Postoperative changes from hiatal hernia repair. No recurrent hernia. Prior CT scan shows a large right pleural space complex fluid collection likely a postoperative hematoma. Gastric emptying: Normal. Duodenum:  Normal appearance. Other:  None. IMPRESSION: 1.  No mass, stricture, or gastroesophageal reflux noted 2. 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  No  hiatal hernia. Electronically Signed   By: Marijo Sanes M.D.   On: 04/01/2022 12:27   CT CHEST WO CONTRAST  Result Date: 03/31/2022 CLINICAL DATA:  Pneumonia, complication suspected. X-ray done. Status post hiatal hernia repair. Coughing with fluid retention in the hernia sac. Evaluation for better idea if there is more going on the simple effusion. EXAM: CT CHEST WITHOUT CONTRAST TECHNIQUE: Multidetector CT imaging of the chest was performed following the standard protocol without IV contrast. RADIATION DOSE REDUCTION: This exam was performed according to the departmental dose-optimization program which includes automated exposure control, adjustment of the mA and/or kV according to patient size and/or use of iterative reconstruction technique. COMPARISON:  Radiographs earlier today and CT abdomen and pelvis 03/24/2022 FINDINGS: Evaluation is limited by lack of IV contrast. Cardiovascular: Heart size upper limits of normal. Small pericardial effusion. Right PICC tip in the low SVC. Mediastinum/Nodes: No definite adenopathy contrast.  Fluid level within the esophagus reaching near the thoracic inlet. The mid and distal esophagus are poorly visualized. Redemonstrated large hiatal hernia with intrathoracic stomach in the right hemithorax. Lungs/Pleura: Complete collapse of the right middle and lower lobes with mucous plugging. Small-moderate low-density right pleural effusion (Hounsfield units approximately 9). Small left pleural effusion and partial atelectasis of the left lower lobe with mucous plugging. Atelectasis in the lingula. Upper Abdomen: New surgical clips near the expected location of the gastroesophageal junction likely related to hernia repair on 03/27/2022. Partially visualized stranding and edema about the intraabdominal portion of the stomach. New herniation of the anterior left lobe of the liver along with fat and locules of gas into a subxiphoid hernia in the anterior chest/abdominal wall  Musculoskeletal: No acute osseous abnormality. IMPRESSION: 1. Redemonstrated large hiatal hernia with intrathoracic portions of the stomach in the right hemithorax. 2. Complete collapse of the right middle and lower lobe and partial collapse of the left lower lobe with mucous plugging. This is favored due to aspiration given large hernia and fluid level in the esophagus. 3. Small-moderate right and small left pleural effusions are the density of simple fluid. 4. Marked stranding and edema in the upper abdomen about the intraabdominal portion of the stomach. Partial herniation of the right lobe of the liver along with fat and locules of gas into a subxiphoid hernia. These findings may be postoperative in nature however if there is concern for perforation, CT chest and abdomen with IV and oral contrast is recommended. This case was discussed by telephone at the time of interpretation on 03/31/2022 at 11:53 pm to provider Dr. Bobbye Morton, who verbally acknowledged these results. Electronically Signed   By: Placido Sou M.D.   On: 03/31/2022 23:54   DG CHEST PORT 1 VIEW  Result Date: 03/31/2022 CLINICAL DATA:  Pleural effusion.  Follow-up exam. EXAM: PORTABLE CHEST 1 VIEW COMPARISON:  03/28/2022. FINDINGS: Opacity obscures the lower half of the right hemithorax, unchanged from the prior exam. There is also opacity at the left lung base obscuring the hemidiaphragm, also unchanged. Remainder of the lungs is clear. No pneumothorax. Right PICC stable and well positioned. IMPRESSION: 1. No significant change from the most recent prior exam. 2. Right mid to lower hemithorax opacity and left lung base opacity consistent with a combination of pleural effusions and atelectasis and/or pneumonia. No evidence of pulmonary edema. Electronically Signed   By: Lajean Manes M.D.   On: 03/31/2022 14:15   DG CHEST PORT 1 VIEW  Result Date: 03/29/2022 CLINICAL DATA:  Shortness of breath. Laparoscopic repair of hiatal hernia. EXAM:  PORTABLE CHEST 1 VIEW COMPARISON:  CT abdomen and pelvis 03/24/2022 FINDINGS: There is opacification in the inferior right hemithorax which may be related to combination of pleural fluid, atelectasis/airspace disease or residual hernia. The left lung is clear. There is no pneumothorax or mediastinal shift. The cardiomediastinal silhouette is within normal limits. Right-sided central venous catheter tip projects over the SVC. No acute fractures are seen. IMPRESSION: Opacification in the inferior right hemithorax may be related to combination of pleural fluid, atelectasis/airspace disease or residual hernia. No pneumothorax or mediastinal shift. Electronically Signed   By: Ronney Asters M.D.   On: 03/29/2022 00:00   [place] EKG SITE RITE  Result Date: 03/27/2022 If Site Rite image not attached, placement could not be confirmed due to current cardiac rhythm.  CT ABDOMEN PELVIS W CONTRAST  Result Date: 03/24/2022 CLINICAL DATA:  72 year old female with history of acute onset of  nonlocalized abdominal pain. Vomiting. EXAM: CT ABDOMEN AND PELVIS WITH CONTRAST TECHNIQUE: Multidetector CT imaging of the abdomen and pelvis was performed using the standard protocol following bolus administration of intravenous contrast. RADIATION DOSE REDUCTION: This exam was performed according to the departmental dose-optimization program which includes automated exposure control, adjustment of the mA and/or kV according to patient size and/or use of iterative reconstruction technique. CONTRAST:  9mL OMNIPAQUE IOHEXOL 350 MG/ML SOLN COMPARISON:  CT of the abdomen and pelvis 06/16/2021. FINDINGS: Lower chest: Large hiatal hernia partially imaged, with greater than 50% of the stomach intrathoracic in position. Hepatobiliary: 1.4 cm low-attenuation lesion in segment 8 of the liver, compatible with a simple cyst (no imaging follow-up recommended). No other suspicious appearing hepatic lesions. No intra or extrahepatic biliary ductal  dilatation. Gallbladder is unremarkable in appearance Pancreas: No pancreatic mass. No pancreatic ductal dilatation. No pancreatic or peripancreatic fluid collections or inflammatory changes. Spleen: Unremarkable. Adrenals/Urinary Tract: Bilateral kidneys and adrenal glands are normal in appearance. No hydroureteronephrosis. Urinary bladder is nearly completely decompressed, but otherwise unremarkable in appearance. Stomach/Bowel: Large hiatal hernia with greater than 50% of the stomach in the intrathoracic position. Stomach appears rotated along its long axis (i.e., this appears to represent an organo-axial volvulus), similar to prior studies. Intrathoracic and intra-abdominal portions of the stomach are both distended, and there is a large air-fluid level in the intrathoracic portion of the stomach. Compression and narrowing of the antral pre-pyloric region of the stomach in the proximal duodenum is noted, which may suggest gastric outlet obstruction. No pathologic dilatation of other portions of small bowel or colon. A few scattered colonic diverticula are noted, without surrounding inflammatory changes to indicate an acute diverticulitis at this time. Normal appendix. Vascular/Lymphatic: No significant atherosclerotic disease, aneurysm or dissection noted in the abdominal or pelvic vasculature. Circumaortic left renal vein (normal anatomical variant) incidentally noted. No lymphadenopathy noted in the abdomen or pelvis. Reproductive: Retroverted uterus which is heterogeneous in appearance with multiple lesions, some of which are calcified, largest of which is located in the posterior aspect of the body of the uterus, encroaching upon the endometrial canal measuring up to 2.2 cm in diameter (image sagittal image 66 of series 8), most compatible with fibroids. Ovaries are unremarkable in appearance. Other: No significant volume of ascites.  No pneumoperitoneum. Musculoskeletal: There are no aggressive appearing  lytic or blastic lesions noted in the visualized portions of the skeleton. IMPRESSION: 1. 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 Intrathoracic and intra-abdominal portions of the stomach are distended with a large air-fluid level, and the pyloric region and proximal duodenum appear compressed secondary to this altered anatomy, which may suggest associated gastric outlet obstruction. Surgical consultation is recommended. 2. No other acute findings are noted in the abdomen or pelvis on today's examination. 3. Fibroid uterus. Electronically Signed   By: Vinnie Langton M.D.   On: 03/24/2022 06:17    Labs:  CBC: Recent Labs    03/29/22 0300 04/01/22 0739 04/03/22 0330 04/04/22 0230  WBC 7.2 10.3 13.3* 14.7*  HGB 11.1* 10.9* 10.4* 10.6*  HCT 32.4* 33.2* 31.2* 31.8*  PLT 118* 166 219 273    COAGS: No results for input(s): "INR", "APTT" in the last 8760 hours.  BMP: Recent Labs    04/01/22 0242 04/02/22 0310 04/03/22 0330 04/04/22 0230  NA 139 134* 136 134*  K 3.6 4.0 4.2 3.9  CL 105 104 103 103  CO2 25 25 25 25   GLUCOSE 164*  207* 125* 140*  BUN 11 11 9 10   CALCIUM 8.1* 8.1* 8.2* 8.2*  CREATININE 0.54 0.55 0.57 0.63  GFRNONAA >60 >60 >60 >60    LIVER FUNCTION TESTS: Recent Labs    03/24/22 0456 03/28/22 0413 03/31/22 0341 04/03/22 0330  BILITOT 0.9 1.1 1.0 0.9  AST 73* 40 15 19  ALT 67* 45* 21 19  ALKPHOS 85 38 47 73  PROT 6.9 4.7* 4.7* 5.0*  ALBUMIN 3.8 2.6* 1.8* 1.8*     Assessment and Plan: Pelvic fluid collection with rim enhancement concerning for infection. Amenable to perc drain via transgluteal approach. The chest collection likely represents hematoma and is in a difficult location for access. Concern for leakage into pleural space if attempt at percutaneous access. Would also expect limited output if hematoma. Consider CT surgery eval for opinion on management of this collection. Discussed plan for perc  drain to pelvic collection with pt.  Risks and benefits discussed with the patient including bleeding, infection, damage to adjacent structures, bowel perforation/fistula connection, and sepsis.  All of the patient's questions were answered, patient is agreeable to proceed. Consent signed and in chart.    Electronically Signed: Ascencion Dike, PA-C 04/04/2022, 1:54 PM   I spent a total of 20 minutes in face to face in clinical consultation, greater than 50% of which was counseling/coordinating care for  perc drain

## 2022-04-04 NOTE — Progress Notes (Addendum)
PHARMACY - TOTAL PARENTERAL NUTRITION CONSULT NOTE  Indication: Gastric outlet obstruction, hiatal hernia, intra-abdominal adhesions, gastric perforation  Patient Measurements: Height: 5\' 3"  (160 cm) Weight: 61 kg (134 lb 7.7 oz) IBW/kg (Calculated) : 52.4 TPN AdjBW (KG): 59 Body mass index is 23.82 kg/m. Usual Weight: 60 kg  Assessment:  [name] presented w/ abdominal pain and vomiting. Known to have large hiatal hernia and gastric volvulus but new gastric outlet obstruction. S/p laparoscopic LoA, repair of hernia, and gastrostomy tube insertion. OR findings significant for extensive intra-abdominal adhesions, gastric perforation, and hepatomegaly. Pharmacy consulted to dose TPN.  Glucose / Insulin: no hx DM, [name] 5.9%.  CBGs controlled with 1/2 rate TPN. Used 9 units SSI in the past 24 hrs Electrolytes: all WNL except low Na Renal: SCr <1, BUN WNL Hepatic: LFTs / tbili / TG WNL, albumin 1.8 Intake / Output; MIVF: UOP not documented (4 occurrences), LBM 1/11 GI Imaging:  1/9 UGI - mod to severe esophageal dysmotility  GI Surgeries / Procedures: none since TPN start  Central access: 03/28/22 TPN start date: 03/28/22  Nutritional Goals:  RD Estimated Needs Total Energy Estimated Needs: 1700-1900 Total Protein Estimated Needs: 85-100g Total Fluid Estimated Needs: >/=1.7L  Current Nutrition:  TPN Nepro Carb Steady BID - none charted given Soft diet started 1/11 - consume 100% of lunch and was full so only at 25-30% of dinner  Plan:  Continue half TPN and soft diet per Surgery Continue TPN at 35 ml/hr (goal rate 75 ml/hr) to provide 42g AA and 834 kCal, meeting 50% of needs Electrolytes in TPN: increase Na to 132mEq/L, K to 27mEq/L, Mg to 28mEq/L and Phos to 27mmol/L, Ca 61mEq/L, Cl:Ac 1:1 Add standard MVI and trace elements to TPN Continue moderate SSI Q6H Monitor TPN labs on Mon/Thurs, labs on 1/14 if still on TPN F/u PO intake to stop TPN, CT result  Alyssa Peterson, PharmD, BCPS,  [place] 04/04/2022, 9:55 AM   ============================  Addendum: D/C TPN per Surgery Reduce TPN to 20 ml/hr, then stop at 1800 D/C TPN and nursing care orders  Alyssa Peterson, PharmD, BCPS, [place] 04/04/2022, 12:20 PM

## 2022-04-05 LAB — CBC
HCT: 31.9 % — ABNORMAL LOW (ref 36.0–46.0)
Hemoglobin: 10.7 g/dL — ABNORMAL LOW (ref 12.0–15.0)
MCH: 29.4 pg (ref 26.0–34.0)
MCHC: 33.5 g/dL (ref 30.0–36.0)
MCV: 87.6 fL (ref 80.0–100.0)
Platelets: 300 10*3/uL (ref 150–400)
RBC: 3.64 MIL/uL — ABNORMAL LOW (ref 3.87–5.11)
RDW: 14.7 % (ref 11.5–15.5)
WBC: 14.6 10*3/uL — ABNORMAL HIGH (ref 4.0–10.5)
nRBC: 0 % (ref 0.0–0.2)

## 2022-04-05 LAB — GLUCOSE, CAPILLARY
Glucose-Capillary: 108 mg/dL — ABNORMAL HIGH (ref 70–99)
Glucose-Capillary: 110 mg/dL — ABNORMAL HIGH (ref 70–99)
Glucose-Capillary: 114 mg/dL — ABNORMAL HIGH (ref 70–99)
Glucose-Capillary: 118 mg/dL — ABNORMAL HIGH (ref 70–99)

## 2022-04-05 MED ORDER — ALTEPLASE 2 MG IJ SOLR
2.0000 mg | Freq: Once | INTRAMUSCULAR | Status: AC
  Administered 2022-04-05: 2 mg
  Filled 2022-04-05: qty 2

## 2022-04-05 NOTE — Progress Notes (Signed)
9 Days Post-Op   Subjective/Chief Complaint: Comfortable this morning No complaints Denies SOB Tolerated IR drain placement yesterday   Objective: Vital signs in last 24 hours: Temp:  [98.6 F (37 C)-99.4 F (37.4 C)] 99.4 F (37.4 C) (01/13 0559) Pulse Rate:  [84-91] 84 (01/13 0559) Resp:  [16-22] 18 (01/13 0559) BP: (100-123)/(63-88) 118/79 (01/13 0559) SpO2:  [97 %-100 %] 100 % (01/13 0559) Last BM Date : 04/03/22  Intake/Output from previous day: 01/12 0701 - 01/13 0700 In: 180 [P.O.:180] Out: -  Intake/Output this shift: No intake/output data recorded.  Exam: Awake and alert Comfortable No respiratory distress Abdomen soft, NT Drain serosang  Lab Results:  Recent Labs    04/04/22 0230 04/05/22 0309  WBC 14.7* 14.6*  HGB 10.6* 10.7*  HCT 31.8* 31.9*  PLT 273 300   BMET Recent Labs    04/03/22 0330 04/04/22 0230  NA 136 134*  K 4.2 3.9  CL 103 103  CO2 25 25  GLUCOSE 125* 140*  BUN 9 10  CREATININE 0.57 0.63  CALCIUM 8.2* 8.2*   PT/INR No results for input(s): "LABPROT", "INR" in the last 72 hours. ABG No results for input(s): "PHART", "HCO3" in the last 72 hours.  Invalid input(s): "PCO2", "PO2"  Studies/Results: CT GUIDED PERITONEAL/RETROPERITONEAL FLUID DRAIN BY PERC CATH  Result Date: 04/04/2022 INDICATION: Postop pelvic fluid collection, concern for abscess EXAM: RIGHT TRANS GLUTEAL CT PELVIC FLUID COLLECTION DRAIN TECHNIQUE: Multidetector CT imaging of the pelvis was performed following the standard protocol without IV contrast. RADIATION DOSE REDUCTION: This exam was performed according to the departmental dose-optimization program which includes automated exposure control, adjustment of the mA and/or kV according to patient size and/or use of iterative reconstruction technique. MEDICATIONS: The patient is currently admitted to the hospital and receiving intravenous antibiotics. The antibiotics were administered within an appropriate time  frame prior to the initiation of the procedure. ANESTHESIA/SEDATION: Moderate (conscious) sedation was employed during this procedure. A total of Versed 1.0 mg and Fentanyl 50 mcg was administered intravenously by the radiology nurse. Total intra-service moderate Sedation Time: 10 minutes. The patient's level of consciousness and vital signs were monitored continuously by radiology nursing throughout the procedure under my direct supervision. COMPLICATIONS: None immediate. PROCEDURE: Informed written consent was obtained from the patient after a thorough discussion of the procedural risks, benefits and alternatives. All questions were addressed. Maximal Sterile Barrier Technique was utilized including caps, mask, sterile gowns, sterile gloves, sterile drape, hand hygiene and skin antiseptic. A timeout was performed prior to the initiation of the procedure. Previous imaging reviewed. Patient positioned prone. Noncontrast localization CT performed. The posterior pelvic cul-de-sac fluid collection was localized and marked for a right trans gluteal approach. Under sterile conditions and local anesthesia, an 18 gauge 10 cm access needle was advanced from a right trans gluteal approach into the fluid collection. Needle position confirmed with CT. Syringe aspiration yielded thin blood tinged fluid. Guidewire inserted followed by tract dilatation insert a 10 French drain. Drain catheter position confirmed with CT. Syringe aspiration yielded 30 cc thin blood tinged slightly exudative fluid. Sample sent for culture. Catheter secured with Prolene suture and connected to external suction bulb. Sterile dressing applied. No immediate complication. Patient tolerated the procedure well. IMPRESSION: Successful CT-guided right transgluteal pelvic fluid collection drain placement Electronically Signed   By: Jerilynn Mages.  Shick M.D.   On: 04/04/2022 16:30   CT CHEST ABDOMEN PELVIS W CONTRAST  Addendum Date: 04/04/2022   ADDENDUM REPORT:  04/04/2022 11:30 ADDENDUM:  Correction: IMPRESSION: 1. A 11.0 x 9.1 centimeters rim enhancing fluid collection in the RIGHT mid chest, consistent with isolated hernia sac. Locules of gas are identified within this collection. These results will be called to the ordering clinician or representative by the Radiologist Assistant, and communication documented in the PACS or [employer]. Electronically Signed   By: Norva Pavlov M.D.   On: 04/04/2022 11:30   Result Date: 04/04/2022 CLINICAL DATA:  Rule out intra-abdominal abscess. Re-evaluate fluid collection in the chest from hiatal hernia sac. Patient had hernia repair, gastrostomy tube insertion, lysis of adhesions for incarcerated paraesophageal hernia on 03/27/2022. EXAM: CT CHEST, ABDOMEN, AND PELVIS WITH CONTRAST TECHNIQUE: Multidetector CT imaging of the chest, abdomen and pelvis was performed following the standard protocol during bolus administration of intravenous contrast. RADIATION DOSE REDUCTION: This exam was performed according to the departmental dose-optimization program which includes automated exposure control, adjustment of the mA and/or kV according to patient size and/or use of iterative reconstruction technique. CONTRAST:  56mL OMNIPAQUE IOHEXOL 350 MG/ML SOLN COMPARISON:  CT of the abdomen and pelvis on 03/24/2022, UPPER GI on 04/01/2022 and CT chest on 03/31/2022 FINDINGS: CT CHEST FINDINGS Cardiovascular: Heart size is normal. No significant coronary artery calcifications or atherosclerosis of the thoracic aorta. Patient has RIGHT PICC line, tip to the superior vena cava. Mediastinum/Nodes: Scattered calcifications and small low-attenuation lesions are identified within the thyroid, largest measuring 7 millimeters in the LEFT lobe (image 31 of series 3). Esophagus is unremarkable. Oral contrast empties into the stomach. Lungs/Pleura: Within the RIGHT mid chest, there is a rim enhancing fluid collection containing locules of gas,  consistent with isolated hernia sac. Collection measures 11.0 x 9.1 centimeters on image 33 of series 3. Locules of gas are identified within this collection. In addition, there is RIGHT pleural effusion similar in volume compared to prior study. RIGHT LOWER and RIGHT middle lobe consolidation/atelectasis again noted. There is no pneumothorax. Airways are patent. Musculoskeletal: No chest wall mass or suspicious bone lesions identified. CT ABDOMEN PELVIS FINDINGS Hepatobiliary: Benign hepatic cyst measuring 1.4 centimeters in the RIGHT hepatic dome. No further imaging is recommended. Otherwise the liver is unremarkable. Gallbladder is present. Pancreas: Unremarkable. No pancreatic ductal dilatation or surrounding inflammatory changes. Spleen: Normal in size without focal abnormality. Adrenals/Urinary Tract: Adrenal glands are normal. Kidneys are symmetric in size and enhancement/excretion. No hydronephrosis. Ureters are unremarkable. The bladder and visualized portion of the urethra are normal. Stomach/Bowel: The gastroesophageal junction is patent. There are surgical clips along the proximal greater curvature of the stomach. There is postoperative thickening of the proximal stomach wall. No residual hernia is identified. Interval placement of gastrostomy tube in the appropriate location. Small bowel loops are normal in appearance. There are numerous colonic diverticula, many of which contain contrast. There is mild mesenteric edema, limiting evaluation for acute diverticulitis, though none is seen. The appendix is well seen and normal in appearance. Vascular/Lymphatic: No significant vascular findings are present. No enlarged abdominal or pelvic lymph nodes. Reproductive: Uterus is present and contains numerous fibroids. There has been interval development rim enhancing pelvic fluid collections. The largest is in the cul-de-sac and extends into the RIGHT adnexal region, measuring 7.9 x 3.8 centimeters. A second in  the LEFT adnexal region is 2.9 x 2.7 centimeters on image 97 of series 3. This collection is posterior to the LEFT ovary. Other: Postoperative changes in the anterior abdominal wall. Mild body wall edema. Musculoskeletal: Mild degenerative changes in the lumbosacral spine. IMPRESSION: 1. A  1.0 x 9.1 centimeters rim enhancing fluid collection in the RIGHT mid chest, consistent with isolated hernia sac. Locules of gas are identified within this collection. 2. Stable RIGHT pleural effusion and RIGHT LOWER lobe and RIGHT middle lobe consolidation/atelectasis. 3. Interval placement of gastrostomy tube in appropriate location. 4. Interval development of rim enhancing pelvic fluid collections, largest in the cul-de-sac and extends into the RIGHT adnexal region, measuring 7.9 x 3.8 centimeters. Findings are suspicious for abscesses. 5. Colonic diverticulosis. 6. Fibroid uterus. 7. Mild body wall edema. 8. Subcentimeter incidental left thyroid nodule. No follow-up imaging is recommended. Reference: J Am Coll Radiol. 2015 Feb;12(2): 143-50 Electronically Signed: By: Nolon Nations M.D. On: 04/04/2022 11:19    Anti-infectives: Anti-infectives (From admission, onward)    Start     Dose/Rate Route Frequency Ordered Stop   03/27/22 1600  ceFEPIme (MAXIPIME) 2 g in sodium chloride 0.9 % 100 mL IVPB        2 g 200 mL/hr over 30 Minutes Intravenous Every 12 hours 03/27/22 1501 04/01/22 0427   03/27/22 1600  metroNIDAZOLE (FLAGYL) IVPB 500 mg        500 mg 100 mL/hr over 60 Minutes Intravenous Every 12 hours 03/27/22 1501 04/01/22 0456   03/27/22 0600  ceFAZolin (ANCEF) IVPB 2g/100 mL premix        2 g 200 mL/hr over 30 Minutes Intravenous On call to O.R. 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  WBC still at 14K.  Continue antibiotics Soft diet Thoracic surgery to review chest  fluid collection on Monday.    Coralie Keens MD 04/05/2022

## 2022-04-05 NOTE — Progress Notes (Signed)
Chief Complaint: Patient was seen today for pelvic abscess drain   Supervising Physician: Ruel Favors  Patient Status: [facility] - In-pt  Subjective: S/p TG drain to pelvic abscess yesterday Feels ok. Some soreness at drain site.  Objective: Physical Exam: BP 110/75 (BP Location: Left Arm)   Pulse 81   Temp 98.3 F (36.8 C) (Oral)   Resp 16   Ht 5\' 3"  (1.6 m)   Wt 134 lb 7.7 oz (61 kg)   SpO2 98%   BMI 23.82 kg/m  Drain intact , site clean, NT Output hazy serosanguinous   Current Facility-Administered Medications:    0.9 %  sodium chloride infusion, , Intravenous, Continuous, Dang, Thuy D, RPH, Last Rate: 20 mL/hr at 03/31/22 1748, New Bag at 03/31/22 1748   acetaminophen (TYLENOL) tablet 650 mg, 650 mg, Oral, Q6H PRN **OR** acetaminophen (TYLENOL) suppository 650 mg, 650 mg, Rectal, Q6H PRN, 05/30/22, Chelsea A, MD   albuterol (PROVENTIL) (2.5 MG/3ML) 0.083% nebulizer solution 2.5 mg, 2.5 mg, Nebulization, Q6H PRN, Fredricka Bonine, MD   amLODipine (NORVASC) tablet 10 mg, 10 mg, Oral, QHS, Manus Rudd, MD, 10 mg at 04/03/22 2145   Chlorhexidine Gluconate Cloth 2 % PADS 6 each, 6 each, Topical, Daily, 2146 A, MD, 6 each at 04/05/22 1014   diphenhydrAMINE (BENADRYL) 12.5 MG/5ML elixir 12.5 mg, 12.5 mg, Oral, Q6H PRN **OR** diphenhydrAMINE (BENADRYL) injection 12.5 mg, 12.5 mg, Intravenous, Q6H PRN, 04/07/22, Chelsea A, MD   enoxaparin (LOVENOX) injection 40 mg, 40 mg, Subcutaneous, Q24H, Connor, Chelsea A, MD, 40 mg at 04/03/22 1305   feeding supplement (NEPRO CARB STEADY) liquid 237 mL, 237 mL, Oral, BID BM, 06/02/22, PA-C, 237 mL at 04/05/22 1015   HYDROmorphone (DILAUDID) injection 0.5 mg, 0.5 mg, Intravenous, Q4H PRN, 04/07/22, MD   influenza vaccine adjuvanted (FLUAD) injection 0.5 mL, 0.5 mL, Intramuscular, Prior to discharge, Crosley, Debby, MD   insulin aspart (novoLOG) injection 0-15 Units, 0-15 Units, Subcutaneous, Q6H, Dang, Thuy D, RPH,  2 Units at 04/04/22 1833   latanoprost (XALATAN) 0.005 % ophthalmic solution 1 drop, 1 drop, Both Eyes, QHS, Osborne, Kelly, PA-C, 1 drop at 04/04/22 2328   metoCLOPramide (REGLAN) injection 10 mg, 10 mg, Intravenous, Q6H, Connor, Chelsea A, MD, 10 mg at 04/05/22 0554   metoprolol tartrate (LOPRESSOR) injection 5 mg, 5 mg, Intravenous, Q6H PRN, 04/07/22, Chelsea A, MD   ondansetron (ZOFRAN-ODT) disintegrating tablet 4 mg, 4 mg, Oral, Q6H PRN **OR** ondansetron (ZOFRAN) injection 4 mg, 4 mg, Intravenous, Q6H PRN, Fredricka Bonine A, MD, 4 mg at 03/26/22 2202   oxyCODONE (Oxy IR/ROXICODONE) immediate release tablet 5 mg, 5 mg, Oral, Q4H PRN, 2203 A, MD, 5 mg at 04/04/22 2138   pantoprazole (PROTONIX) injection 40 mg, 40 mg, Intravenous, Q12H, 2139 A, MD, 40 mg at 04/04/22 2138   pneumococcal 20-valent conjugate vaccine (PREVNAR 20) injection 0.5 mL, 0.5 mL, Intramuscular, Prior to discharge, 2139 A, MD   sodium chloride flush (NS) 0.9 % injection 10-40 mL, 10-40 mL, Intracatheter, Q12H, 09-13-1996, Chelsea A, MD, 10 mL at 04/05/22 1015   sodium chloride flush (NS) 0.9 % injection 10-40 mL, 10-40 mL, Intracatheter, PRN, 09-13-1996, MD  Labs: CBC Recent Labs    04/04/22 0230 04/05/22 0309  WBC 14.7* 14.6*  HGB 10.6* 10.7*  HCT 31.8* 31.9*  PLT 273 300   BMET Recent Labs    04/03/22 0330 04/04/22 0230  NA 136 134*  K 4.2 3.9  CL  103 103  CO2 25 25  GLUCOSE 125* 140*  BUN 9 10  CREATININE 0.57 0.63  CALCIUM 8.2* 8.2*   LFT Recent Labs    04/03/22 0330  PROT 5.0*  ALBUMIN 1.8*  AST 19  ALT 19  ALKPHOS 73  BILITOT 0.9   PT/INR No results for input(s): "LABPROT", "INR" in the last 72 hours.   Studies/Results: CT GUIDED PERITONEAL/RETROPERITONEAL FLUID DRAIN BY PERC CATH  Result Date: 04/04/2022 INDICATION: Postop pelvic fluid collection, concern for abscess EXAM: RIGHT TRANS GLUTEAL CT PELVIC FLUID COLLECTION DRAIN TECHNIQUE: Multidetector  CT imaging of the pelvis was performed following the standard protocol without IV contrast. RADIATION DOSE REDUCTION: This exam was performed according to the departmental dose-optimization program which includes automated exposure control, adjustment of the mA and/or kV according to patient size and/or use of iterative reconstruction technique. MEDICATIONS: The patient is currently admitted to the hospital and receiving intravenous antibiotics. The antibiotics were administered within an appropriate time frame prior to the initiation of the procedure. ANESTHESIA/SEDATION: Moderate (conscious) sedation was employed during this procedure. A total of Versed 1.0 mg and Fentanyl 50 mcg was administered intravenously by the radiology nurse. Total intra-service moderate Sedation Time: 10 minutes. The patient's level of consciousness and vital signs were monitored continuously by radiology nursing throughout the procedure under my direct supervision. COMPLICATIONS: None immediate. PROCEDURE: Informed written consent was obtained from the patient after a thorough discussion of the procedural risks, benefits and alternatives. All questions were addressed. Maximal Sterile Barrier Technique was utilized including caps, mask, sterile gowns, sterile gloves, sterile drape, hand hygiene and skin antiseptic. A timeout was performed prior to the initiation of the procedure. Previous imaging reviewed. Patient positioned prone. Noncontrast localization CT performed. The posterior pelvic cul-de-sac fluid collection was localized and marked for a right trans gluteal approach. Under sterile conditions and local anesthesia, an 18 gauge 10 cm access needle was advanced from a right trans gluteal approach into the fluid collection. Needle position confirmed with CT. Syringe aspiration yielded thin blood tinged fluid. Guidewire inserted followed by tract dilatation insert a 10 French drain. Drain catheter position confirmed with CT. Syringe  aspiration yielded 30 cc thin blood tinged slightly exudative fluid. Sample sent for culture. Catheter secured with Prolene suture and connected to external suction bulb. Sterile dressing applied. No immediate complication. Patient tolerated the procedure well. IMPRESSION: Successful CT-guided right transgluteal pelvic fluid collection drain placement Electronically Signed   By: Jerilynn Mages.  Shick M.D.   On: 04/04/2022 16:30   CT CHEST ABDOMEN PELVIS W CONTRAST  Addendum Date: 04/04/2022   ADDENDUM REPORT: 04/04/2022 11:30 ADDENDUM: Correction: IMPRESSION: 1. A 11.0 x 9.1 centimeters rim enhancing fluid collection in the RIGHT mid chest, consistent with isolated hernia sac. Locules of gas are identified within this collection. These results will be called to the ordering clinician or representative by the Radiologist Assistant, and communication documented in the PACS or [employer]. Electronically Signed   By: Nolon Nations M.D.   On: 04/04/2022 11:30   Result Date: 04/04/2022 CLINICAL DATA:  Rule out intra-abdominal abscess. Re-evaluate fluid collection in the chest from hiatal hernia sac. Patient had hernia repair, gastrostomy tube insertion, lysis of adhesions for incarcerated paraesophageal hernia on 03/27/2022. EXAM: CT CHEST, ABDOMEN, AND PELVIS WITH CONTRAST TECHNIQUE: Multidetector CT imaging of the chest, abdomen and pelvis was performed following the standard protocol during bolus administration of intravenous contrast. RADIATION DOSE REDUCTION: This exam was performed according to the departmental dose-optimization program which includes  automated exposure control, adjustment of the mA and/or kV according to patient size and/or use of iterative reconstruction technique. CONTRAST:  34mL OMNIPAQUE IOHEXOL 350 MG/ML SOLN COMPARISON:  CT of the abdomen and pelvis on 03/24/2022, UPPER GI on 04/01/2022 and CT chest on 03/31/2022 FINDINGS: CT CHEST FINDINGS Cardiovascular: Heart size is normal. No  significant coronary artery calcifications or atherosclerosis of the thoracic aorta. Patient has RIGHT PICC line, tip to the superior vena cava. Mediastinum/Nodes: Scattered calcifications and small low-attenuation lesions are identified within the thyroid, largest measuring 7 millimeters in the LEFT lobe (image 31 of series 3). Esophagus is unremarkable. Oral contrast empties into the stomach. Lungs/Pleura: Within the RIGHT mid chest, there is a rim enhancing fluid collection containing locules of gas, consistent with isolated hernia sac. Collection measures 11.0 x 9.1 centimeters on image 33 of series 3. Locules of gas are identified within this collection. In addition, there is RIGHT pleural effusion similar in volume compared to prior study. RIGHT LOWER and RIGHT middle lobe consolidation/atelectasis again noted. There is no pneumothorax. Airways are patent. Musculoskeletal: No chest wall mass or suspicious bone lesions identified. CT ABDOMEN PELVIS FINDINGS Hepatobiliary: Benign hepatic cyst measuring 1.4 centimeters in the RIGHT hepatic dome. No further imaging is recommended. Otherwise the liver is unremarkable. Gallbladder is present. Pancreas: Unremarkable. No pancreatic ductal dilatation or surrounding inflammatory changes. Spleen: Normal in size without focal abnormality. Adrenals/Urinary Tract: Adrenal glands are normal. Kidneys are symmetric in size and enhancement/excretion. No hydronephrosis. Ureters are unremarkable. The bladder and visualized portion of the urethra are normal. Stomach/Bowel: The gastroesophageal junction is patent. There are surgical clips along the proximal greater curvature of the stomach. There is postoperative thickening of the proximal stomach wall. No residual hernia is identified. Interval placement of gastrostomy tube in the appropriate location. Small bowel loops are normal in appearance. There are numerous colonic diverticula, many of which contain contrast. There is mild  mesenteric edema, limiting evaluation for acute diverticulitis, though none is seen. The appendix is well seen and normal in appearance. Vascular/Lymphatic: No significant vascular findings are present. No enlarged abdominal or pelvic lymph nodes. Reproductive: Uterus is present and contains numerous fibroids. There has been interval development rim enhancing pelvic fluid collections. The largest is in the cul-de-sac and extends into the RIGHT adnexal region, measuring 7.9 x 3.8 centimeters. A second in the LEFT adnexal region is 2.9 x 2.7 centimeters on image 97 of series 3. This collection is posterior to the LEFT ovary. Other: Postoperative changes in the anterior abdominal wall. Mild body wall edema. Musculoskeletal: Mild degenerative changes in the lumbosacral spine. IMPRESSION: 1. A 1.0 x 9.1 centimeters rim enhancing fluid collection in the RIGHT mid chest, consistent with isolated hernia sac. Locules of gas are identified within this collection. 2. Stable RIGHT pleural effusion and RIGHT LOWER lobe and RIGHT middle lobe consolidation/atelectasis. 3. Interval placement of gastrostomy tube in appropriate location. 4. Interval development of rim enhancing pelvic fluid collections, largest in the cul-de-sac and extends into the RIGHT adnexal region, measuring 7.9 x 3.8 centimeters. Findings are suspicious for abscesses. 5. Colonic diverticulosis. 6. Fibroid uterus. 7. Mild body wall edema. 8. Subcentimeter incidental left thyroid nodule. No follow-up imaging is recommended. Reference: J Am Coll Radiol. 2015 Feb;12(2): 143-50 Electronically Signed: By: Nolon Nations M.D. On: 04/04/2022 11:19    Assessment/Plan: S/p perc drain to pelvic fluid collection yesterday Cont drain care. IR following    LOS: 12 days   I spent a total of 20 minutes  in face to face in clinical consultation, greater than 50% of which was counseling/coordinating care for drain  Brayton El PA-C 04/05/2022 11:12 AM

## 2022-04-06 LAB — GLUCOSE, CAPILLARY
Glucose-Capillary: 103 mg/dL — ABNORMAL HIGH (ref 70–99)
Glucose-Capillary: 108 mg/dL — ABNORMAL HIGH (ref 70–99)
Glucose-Capillary: 118 mg/dL — ABNORMAL HIGH (ref 70–99)
Glucose-Capillary: 123 mg/dL — ABNORMAL HIGH (ref 70–99)

## 2022-04-06 LAB — CBC
HCT: 32.3 % — ABNORMAL LOW (ref 36.0–46.0)
Hemoglobin: 10.6 g/dL — ABNORMAL LOW (ref 12.0–15.0)
MCH: 29.1 pg (ref 26.0–34.0)
MCHC: 32.8 g/dL (ref 30.0–36.0)
MCV: 88.7 fL (ref 80.0–100.0)
Platelets: 353 10*3/uL (ref 150–400)
RBC: 3.64 MIL/uL — ABNORMAL LOW (ref 3.87–5.11)
RDW: 14.6 % (ref 11.5–15.5)
WBC: 16.5 10*3/uL — ABNORMAL HIGH (ref 4.0–10.5)
nRBC: 0 % (ref 0.0–0.2)

## 2022-04-06 NOTE — Progress Notes (Signed)
Mobility Specialist Progress Note:   04/06/22 1427  Mobility  Activity Ambulated with assistance in hallway  Level of Assistance Standby assist, set-up cues, supervision of patient - no hands on  Assistive Device Front wheel walker  Distance Ambulated (ft) 250 ft  Activity Response Tolerated well  Mobility Referral Yes  $Mobility charge 1 Mobility   Pt received in bed and agreeable. C/o weakness in BLE. Pt left in bed with all needs met and call bell in reach.    Andrey Campanile Mobility Specialist Please contact via SecureChat or  Rehab office at [phone]

## 2022-04-06 NOTE — Progress Notes (Signed)
10 Days Post-Op   Subjective/Chief Complaint: No complaints Feels well No chest pain or SOB Denies abdominal pain Tolerating po   Objective: Vital signs in last 24 hours: Temp:  [98.3 F (36.8 C)-98.6 F (37 C)] 98.6 F (37 C) (01/14 0804) Pulse Rate:  [81-89] 84 (01/14 0804) Resp:  [16-17] 17 (01/14 0804) BP: (99-113)/(68-76) 111/76 (01/14 0804) SpO2:  [97 %-98 %] 97 % (01/14 0804) Weight:  [53.7 kg] 53.7 kg (01/14 0626) Last BM Date : 04/03/22  Intake/Output from previous day: 01/13 0701 - 01/14 0700 In: 0 [I.V.:0] Out: 35 [Drains:35] Intake/Output this shift: No intake/output data recorded.  Exam: Awake and alert No respiratory distress Abdomen soft, NT, drain serosang  Lab Results:  Recent Labs    04/05/22 0309 04/06/22 0453  WBC 14.6* 16.5*  HGB 10.7* 10.6*  HCT 31.9* 32.3*  PLT 300 353   BMET Recent Labs    04/04/22 0230  NA 134*  K 3.9  CL 103  CO2 25  GLUCOSE 140*  BUN 10  CREATININE 0.63  CALCIUM 8.2*   PT/INR No results for input(s): "LABPROT", "INR" in the last 72 hours. ABG No results for input(s): "PHART", "HCO3" in the last 72 hours.  Invalid input(s): "PCO2", "PO2"  Studies/Results: CT GUIDED PERITONEAL/RETROPERITONEAL FLUID DRAIN BY PERC CATH  Result Date: 04/04/2022 INDICATION: Postop pelvic fluid collection, concern for abscess EXAM: RIGHT TRANS GLUTEAL CT PELVIC FLUID COLLECTION DRAIN TECHNIQUE: Multidetector CT imaging of the pelvis was performed following the standard protocol without IV contrast. RADIATION DOSE REDUCTION: This exam was performed according to the departmental dose-optimization program which includes automated exposure control, adjustment of the mA and/or kV according to patient size and/or use of iterative reconstruction technique. MEDICATIONS: The patient is currently admitted to the hospital and receiving intravenous antibiotics. The antibiotics were administered within an appropriate time frame prior to the  initiation of the procedure. ANESTHESIA/SEDATION: Moderate (conscious) sedation was employed during this procedure. A total of Versed 1.0 mg and Fentanyl 50 mcg was administered intravenously by the radiology nurse. Total intra-service moderate Sedation Time: 10 minutes. The patient's level of consciousness and vital signs were monitored continuously by radiology nursing throughout the procedure under my direct supervision. COMPLICATIONS: None immediate. PROCEDURE: Informed written consent was obtained from the patient after a thorough discussion of the procedural risks, benefits and alternatives. All questions were addressed. Maximal Sterile Barrier Technique was utilized including caps, mask, sterile gowns, sterile gloves, sterile drape, hand hygiene and skin antiseptic. A timeout was performed prior to the initiation of the procedure. Previous imaging reviewed. Patient positioned prone. Noncontrast localization CT performed. The posterior pelvic cul-de-sac fluid collection was localized and marked for a right trans gluteal approach. Under sterile conditions and local anesthesia, an 18 gauge 10 cm access needle was advanced from a right trans gluteal approach into the fluid collection. Needle position confirmed with CT. Syringe aspiration yielded thin blood tinged fluid. Guidewire inserted followed by tract dilatation insert a 10 French drain. Drain catheter position confirmed with CT. Syringe aspiration yielded 30 cc thin blood tinged slightly exudative fluid. Sample sent for culture. Catheter secured with Prolene suture and connected to external suction bulb. Sterile dressing applied. No immediate complication. Patient tolerated the procedure well. IMPRESSION: Successful CT-guided right transgluteal pelvic fluid collection drain placement Electronically Signed   By: Jerilynn Mages.  Shick M.D.   On: 04/04/2022 16:30   CT CHEST ABDOMEN PELVIS W CONTRAST  Addendum Date: 04/04/2022   ADDENDUM REPORT: 04/04/2022 11:30  ADDENDUM: Correction:  IMPRESSION: 1. A 11.0 x 9.1 centimeters rim enhancing fluid collection in the RIGHT mid chest, consistent with isolated hernia sac. Locules of gas are identified within this collection. These results will be called to the ordering clinician or representative by the Radiologist Assistant, and communication documented in the PACS or [employer]. Electronically Signed   By: Nolon Nations M.D.   On: 04/04/2022 11:30   Result Date: 04/04/2022 CLINICAL DATA:  Rule out intra-abdominal abscess. Re-evaluate fluid collection in the chest from hiatal hernia sac. Patient had hernia repair, gastrostomy tube insertion, lysis of adhesions for incarcerated paraesophageal hernia on 03/27/2022. EXAM: CT CHEST, ABDOMEN, AND PELVIS WITH CONTRAST TECHNIQUE: Multidetector CT imaging of the chest, abdomen and pelvis was performed following the standard protocol during bolus administration of intravenous contrast. RADIATION DOSE REDUCTION: This exam was performed according to the departmental dose-optimization program which includes automated exposure control, adjustment of the mA and/or kV according to patient size and/or use of iterative reconstruction technique. CONTRAST:  65mL OMNIPAQUE IOHEXOL 350 MG/ML SOLN COMPARISON:  CT of the abdomen and pelvis on 03/24/2022, UPPER GI on 04/01/2022 and CT chest on 03/31/2022 FINDINGS: CT CHEST FINDINGS Cardiovascular: Heart size is normal. No significant coronary artery calcifications or atherosclerosis of the thoracic aorta. Patient has RIGHT PICC line, tip to the superior vena cava. Mediastinum/Nodes: Scattered calcifications and small low-attenuation lesions are identified within the thyroid, largest measuring 7 millimeters in the LEFT lobe (image 31 of series 3). Esophagus is unremarkable. Oral contrast empties into the stomach. Lungs/Pleura: Within the RIGHT mid chest, there is a rim enhancing fluid collection containing locules of gas, consistent with  isolated hernia sac. Collection measures 11.0 x 9.1 centimeters on image 33 of series 3. Locules of gas are identified within this collection. In addition, there is RIGHT pleural effusion similar in volume compared to prior study. RIGHT LOWER and RIGHT middle lobe consolidation/atelectasis again noted. There is no pneumothorax. Airways are patent. Musculoskeletal: No chest wall mass or suspicious bone lesions identified. CT ABDOMEN PELVIS FINDINGS Hepatobiliary: Benign hepatic cyst measuring 1.4 centimeters in the RIGHT hepatic dome. No further imaging is recommended. Otherwise the liver is unremarkable. Gallbladder is present. Pancreas: Unremarkable. No pancreatic ductal dilatation or surrounding inflammatory changes. Spleen: Normal in size without focal abnormality. Adrenals/Urinary Tract: Adrenal glands are normal. Kidneys are symmetric in size and enhancement/excretion. No hydronephrosis. Ureters are unremarkable. The bladder and visualized portion of the urethra are normal. Stomach/Bowel: The gastroesophageal junction is patent. There are surgical clips along the proximal greater curvature of the stomach. There is postoperative thickening of the proximal stomach wall. No residual hernia is identified. Interval placement of gastrostomy tube in the appropriate location. Small bowel loops are normal in appearance. There are numerous colonic diverticula, many of which contain contrast. There is mild mesenteric edema, limiting evaluation for acute diverticulitis, though none is seen. The appendix is well seen and normal in appearance. Vascular/Lymphatic: No significant vascular findings are present. No enlarged abdominal or pelvic lymph nodes. Reproductive: Uterus is present and contains numerous fibroids. There has been interval development rim enhancing pelvic fluid collections. The largest is in the cul-de-sac and extends into the RIGHT adnexal region, measuring 7.9 x 3.8 centimeters. A second in the LEFT adnexal  region is 2.9 x 2.7 centimeters on image 97 of series 3. This collection is posterior to the LEFT ovary. Other: Postoperative changes in the anterior abdominal wall. Mild body wall edema. Musculoskeletal: Mild degenerative changes in the lumbosacral spine. IMPRESSION: 1. A 1.0  x 9.1 centimeters rim enhancing fluid collection in the RIGHT mid chest, consistent with isolated hernia sac. Locules of gas are identified within this collection. 2. Stable RIGHT pleural effusion and RIGHT LOWER lobe and RIGHT middle lobe consolidation/atelectasis. 3. Interval placement of gastrostomy tube in appropriate location. 4. Interval development of rim enhancing pelvic fluid collections, largest in the cul-de-sac and extends into the RIGHT adnexal region, measuring 7.9 x 3.8 centimeters. Findings are suspicious for abscesses. 5. Colonic diverticulosis. 6. Fibroid uterus. 7. Mild body wall edema. 8. Subcentimeter incidental left thyroid nodule. No follow-up imaging is recommended. Reference: J Am Coll Radiol. 2015 Feb;12(2): 143-50 Electronically Signed: By: Norva Pavlov M.D. On: 04/04/2022 11:19    Anti-infectives: Anti-infectives (From admission, onward)    Start     Dose/Rate Route Frequency Ordered Stop   03/27/22 1600  ceFEPIme (MAXIPIME) 2 g in sodium chloride 0.9 % 100 mL IVPB        2 g 200 mL/hr over 30 Minutes Intravenous Every 12 hours 03/27/22 1501 04/01/22 0427   03/27/22 1600  metroNIDAZOLE (FLAGYL) IVPB 500 mg        500 mg 100 mL/hr over 60 Minutes Intravenous Every 12 hours 03/27/22 1501 04/01/22 0456   03/27/22 0600  ceFAZolin (ANCEF) IVPB 2g/100 mL premix        2 g 200 mL/hr over 30 Minutes Intravenous On call to O.R. 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 WBC up more Thoracic surgery to review CT/chest collection for opinion May require drainage given  increasing WBC  Abigail Miyamoto MD 04/06/2022

## 2022-04-07 DIAGNOSIS — I10 Essential (primary) hypertension: Secondary | ICD-10-CM

## 2022-04-07 DIAGNOSIS — D649 Anemia, unspecified: Secondary | ICD-10-CM

## 2022-04-07 DIAGNOSIS — K551 Chronic vascular disorders of intestine: Secondary | ICD-10-CM

## 2022-04-07 LAB — CBC
HCT: 32.6 % — ABNORMAL LOW (ref 36.0–46.0)
Hemoglobin: 10.6 g/dL — ABNORMAL LOW (ref 12.0–15.0)
MCH: 29.1 pg (ref 26.0–34.0)
MCHC: 32.5 g/dL (ref 30.0–36.0)
MCV: 89.6 fL (ref 80.0–100.0)
Platelets: 409 10*3/uL — ABNORMAL HIGH (ref 150–400)
RBC: 3.64 MIL/uL — ABNORMAL LOW (ref 3.87–5.11)
RDW: 14.5 % (ref 11.5–15.5)
WBC: 14.9 10*3/uL — ABNORMAL HIGH (ref 4.0–10.5)
nRBC: 0 % (ref 0.0–0.2)

## 2022-04-07 LAB — GLUCOSE, CAPILLARY
Glucose-Capillary: 108 mg/dL — ABNORMAL HIGH (ref 70–99)
Glucose-Capillary: 127 mg/dL — ABNORMAL HIGH (ref 70–99)
Glucose-Capillary: 128 mg/dL — ABNORMAL HIGH (ref 70–99)
Glucose-Capillary: 91 mg/dL (ref 70–99)
Glucose-Capillary: 98 mg/dL (ref 70–99)

## 2022-04-07 LAB — PROTIME-INR
INR: 1.2 (ref 0.8–1.2)
Prothrombin Time: 14.7 seconds (ref 11.4–15.2)

## 2022-04-07 MED ORDER — ALTEPLASE 2 MG IJ SOLR
2.0000 mg | Freq: Once | INTRAMUSCULAR | Status: DC
  Filled 2022-04-07: qty 2

## 2022-04-07 NOTE — Progress Notes (Signed)
Supervising Physician: Ruthann Cancer  Patient Status:  [facility] - In-pt  Chief Complaint:  Pelvic fluid collection, s/p drainage on 04/04/22 with Dr. Annamaria Boots  Subjective:  Reclined in bed watching court tv.  Smiles as she speaks.  Says pain is much better.  Sore at site of drain tract.  Allergies: Latex and Penicillins  Medications: Prior to Admission medications   Medication Sig Start Date End Date Taking? Authorizing Provider  amLODipine (NORVASC) 10 MG tablet TAKE 1 TABLET(10 MG) BY MOUTH DAILY Patient taking differently: Take 10 mg by mouth at bedtime. TAKE 1 TABLET(10 MG) BY MOUTH DAILY 01/07/22  Yes Gildardo Pounds, NP  calcium carbonate (TUMS - DOSED IN MG ELEMENTAL CALCIUM) 500 MG chewable tablet Chew 1 tablet by mouth daily.   Yes [provider]  LUMIGAN 0.01 % SOLN Place 1 drop into both eyes at bedtime. 01/28/22  Yes [provider]     Vital Signs: BP 102/75 (BP Location: Left Arm)   Pulse 84   Temp 98.9 F (37.2 C) (Oral)   Resp 16   Ht 5\' 3"  (1.6 m)   Wt 112 lb 14 oz (51.2 kg)   SpO2 97%   BMI 20.00 kg/m   Physical Exam Vitals reviewed.  Constitutional:      General: She is not in acute distress. HENT:     Head: Normocephalic and atraumatic.  Cardiovascular:     Rate and Rhythm: Normal rate.  Pulmonary:     Effort: Pulmonary effort is normal. No respiratory distress.  Abdominal:     Palpations: Abdomen is soft.  Skin:    General: Skin is warm and dry.  Neurological:     General: No focal deficit present.     Mental Status: She is alert and oriented to person, place, and time.  Psychiatric:        Mood and Affect: Mood normal.        Behavior: Behavior normal.   Drain Location: transgluteal Size: Fr size: 10 Fr Date of placement: 04/04/22  Currently to: Drain collection device: suction bulb Output: approximately 10cc serosanguinous fluid in bulb  Current examination: Flushes/aspirates easily.  Insertion site  unremarkable. Suture and stat lock in place. Dressed appropriately.   Imaging: CT GUIDED PERITONEAL/RETROPERITONEAL FLUID DRAIN BY PERC CATH  Result Date: 04/04/2022 INDICATION: Postop pelvic fluid collection, concern for abscess EXAM: RIGHT TRANS GLUTEAL CT PELVIC FLUID COLLECTION DRAIN TECHNIQUE: Multidetector CT imaging of the pelvis was performed following the standard protocol without IV contrast. RADIATION DOSE REDUCTION: This exam was performed according to the departmental dose-optimization program which includes automated exposure control, adjustment of the mA and/or kV according to patient size and/or use of iterative reconstruction technique. MEDICATIONS: The patient is currently admitted to the hospital and receiving intravenous antibiotics. The antibiotics were administered within an appropriate time frame prior to the initiation of the procedure. ANESTHESIA/SEDATION: Moderate (conscious) sedation was employed during this procedure. A total of Versed 1.0 mg and Fentanyl 50 mcg was administered intravenously by the radiology nurse. Total intra-service moderate Sedation Time: 10 minutes. The patient's level of consciousness and vital signs were monitored continuously by radiology nursing throughout the procedure under my direct supervision. COMPLICATIONS: None immediate. PROCEDURE: Informed written consent was obtained from the patient after a thorough discussion of the procedural risks, benefits and alternatives. All questions were addressed. Maximal Sterile Barrier Technique was utilized including caps, mask, sterile gowns, sterile gloves, sterile drape, hand hygiene and skin antiseptic. A timeout was performed  prior to the initiation of the procedure. Previous imaging reviewed. Patient positioned prone. Noncontrast localization CT performed. The posterior pelvic cul-de-sac fluid collection was localized and marked for a right trans gluteal approach. Under sterile conditions and local anesthesia,  an 18 gauge 10 cm access needle was advanced from a right trans gluteal approach into the fluid collection. Needle position confirmed with CT. Syringe aspiration yielded thin blood tinged fluid. Guidewire inserted followed by tract dilatation insert a 10 French drain. Drain catheter position confirmed with CT. Syringe aspiration yielded 30 cc thin blood tinged slightly exudative fluid. Sample sent for culture. Catheter secured with Prolene suture and connected to external suction bulb. Sterile dressing applied. No immediate complication. Patient tolerated the procedure well. IMPRESSION: Successful CT-guided right transgluteal pelvic fluid collection drain placement Electronically Signed   By: Jerilynn Mages.  Shick M.D.   On: 04/04/2022 16:30   CT CHEST ABDOMEN PELVIS W CONTRAST  Addendum Date: 04/04/2022   ADDENDUM REPORT: 04/04/2022 11:30 ADDENDUM: Correction: IMPRESSION: 1. A 11.0 x 9.1 centimeters rim enhancing fluid collection in the RIGHT mid chest, consistent with isolated hernia sac. Locules of gas are identified within this collection. These results will be called to the ordering clinician or representative by the Radiologist Assistant, and communication documented in the PACS or [employer]. Electronically Signed   By: Nolon Nations M.D.   On: 04/04/2022 11:30   Result Date: 04/04/2022 CLINICAL DATA:  Rule out intra-abdominal abscess. Re-evaluate fluid collection in the chest from hiatal hernia sac. Patient had hernia repair, gastrostomy tube insertion, lysis of adhesions for incarcerated paraesophageal hernia on 03/27/2022. EXAM: CT CHEST, ABDOMEN, AND PELVIS WITH CONTRAST TECHNIQUE: Multidetector CT imaging of the chest, abdomen and pelvis was performed following the standard protocol during bolus administration of intravenous contrast. RADIATION DOSE REDUCTION: This exam was performed according to the departmental dose-optimization program which includes automated exposure control, adjustment of the mA  and/or kV according to patient size and/or use of iterative reconstruction technique. CONTRAST:  24mL OMNIPAQUE IOHEXOL 350 MG/ML SOLN COMPARISON:  CT of the abdomen and pelvis on 03/24/2022, UPPER GI on 04/01/2022 and CT chest on 03/31/2022 FINDINGS: CT CHEST FINDINGS Cardiovascular: Heart size is normal. No significant coronary artery calcifications or atherosclerosis of the thoracic aorta. Patient has RIGHT PICC line, tip to the superior vena cava. Mediastinum/Nodes: Scattered calcifications and small low-attenuation lesions are identified within the thyroid, largest measuring 7 millimeters in the LEFT lobe (image 31 of series 3). Esophagus is unremarkable. Oral contrast empties into the stomach. Lungs/Pleura: Within the RIGHT mid chest, there is a rim enhancing fluid collection containing locules of gas, consistent with isolated hernia sac. Collection measures 11.0 x 9.1 centimeters on image 33 of series 3. Locules of gas are identified within this collection. In addition, there is RIGHT pleural effusion similar in volume compared to prior study. RIGHT LOWER and RIGHT middle lobe consolidation/atelectasis again noted. There is no pneumothorax. Airways are patent. Musculoskeletal: No chest wall mass or suspicious bone lesions identified. CT ABDOMEN PELVIS FINDINGS Hepatobiliary: Benign hepatic cyst measuring 1.4 centimeters in the RIGHT hepatic dome. No further imaging is recommended. Otherwise the liver is unremarkable. Gallbladder is present. Pancreas: Unremarkable. No pancreatic ductal dilatation or surrounding inflammatory changes. Spleen: Normal in size without focal abnormality. Adrenals/Urinary Tract: Adrenal glands are normal. Kidneys are symmetric in size and enhancement/excretion. No hydronephrosis. Ureters are unremarkable. The bladder and visualized portion of the urethra are normal. Stomach/Bowel: The gastroesophageal junction is patent. There are surgical clips along the proximal  greater curvature  of the stomach. There is postoperative thickening of the proximal stomach wall. No residual hernia is identified. Interval placement of gastrostomy tube in the appropriate location. Small bowel loops are normal in appearance. There are numerous colonic diverticula, many of which contain contrast. There is mild mesenteric edema, limiting evaluation for acute diverticulitis, though none is seen. The appendix is well seen and normal in appearance. Vascular/Lymphatic: No significant vascular findings are present. No enlarged abdominal or pelvic lymph nodes. Reproductive: Uterus is present and contains numerous fibroids. There has been interval development rim enhancing pelvic fluid collections. The largest is in the cul-de-sac and extends into the RIGHT adnexal region, measuring 7.9 x 3.8 centimeters. A second in the LEFT adnexal region is 2.9 x 2.7 centimeters on image 97 of series 3. This collection is posterior to the LEFT ovary. Other: Postoperative changes in the anterior abdominal wall. Mild body wall edema. Musculoskeletal: Mild degenerative changes in the lumbosacral spine. IMPRESSION: 1. A 1.0 x 9.1 centimeters rim enhancing fluid collection in the RIGHT mid chest, consistent with isolated hernia sac. Locules of gas are identified within this collection. 2. Stable RIGHT pleural effusion and RIGHT LOWER lobe and RIGHT middle lobe consolidation/atelectasis. 3. Interval placement of gastrostomy tube in appropriate location. 4. Interval development of rim enhancing pelvic fluid collections, largest in the cul-de-sac and extends into the RIGHT adnexal region, measuring 7.9 x 3.8 centimeters. Findings are suspicious for abscesses. 5. Colonic diverticulosis. 6. Fibroid uterus. 7. Mild body wall edema. 8. Subcentimeter incidental left thyroid nodule. No follow-up imaging is recommended. Reference: J Am Coll Radiol. 2015 Feb;12(2): 143-50 Electronically Signed: By: Norva Pavlov M.D. On: 04/04/2022 11:19     Labs:  CBC: Recent Labs    04/04/22 0230 04/05/22 0309 04/06/22 0453 04/07/22 1238  WBC 14.7* 14.6* 16.5* 14.9*  HGB 10.6* 10.7* 10.6* 10.6*  HCT 31.8* 31.9* 32.3* 32.6*  PLT 273 300 353 409*   BMP: Recent Labs    04/01/22 0242 04/02/22 0310 04/03/22 0330 04/04/22 0230  NA 139 134* 136 134*  K 3.6 4.0 4.2 3.9  CL 105 104 103 103  CO2 25 25 25 25   GLUCOSE 164* 207* 125* 140*  BUN 11 11 9 10   CALCIUM 8.1* 8.1* 8.2* 8.2*  CREATININE 0.54 0.55 0.57 0.63  GFRNONAA >60 >60 >60 >60    LIVER FUNCTION TESTS: Recent Labs    03/24/22 0456 03/28/22 0413 03/31/22 0341 04/03/22 0330  BILITOT 0.9 1.1 1.0 0.9  AST 73* 40 15 19  ALT 67* 45* 21 19  ALKPHOS 85 38 47 73  PROT 6.9 4.7* 4.7* 5.0*  ALBUMIN 3.8 2.6* 1.8* 1.8*    Assessment and Plan:  Pelvic abscess --draining; uncertain amounts as they do not appear to have been documented --WBC 14.9 (from 16.5)  Plan: Continue TID flushes with 5 cc NS. Record output Q shift.  Dressing changes QD or PRN if soiled.   Call IR APP or on call IR MD if difficulty flushing or sudden change in drain output.  Repeat imaging/possible drain injection once output < 10 mL/QD (excluding flush material). Consideration for drain removal if output is < 10 mL/QD (excluding flush material), pending discussion with the providing surgical service.  Discharge planning: Please contact IR APP or on call IR MD prior to patient d/c to ensure appropriate follow up plans are in place. Typically patient will follow up with IR clinic 10-14 days post d/c for repeat imaging/possible drain injection. IR scheduler will contact  patient with date/time of appointment. Patient will need to flush drain QD with 5 cc NS, record output QD, dressing changes every 2-3 days or earlier if soiled.   IR will continue to follow - please call with questions or concerns.   Electronically Signed: Sheliah Plane, PA 04/07/2022, 3:51 PM   I spent a total of 15  Minutes at the the patient's bedside AND on the patient's hospital floor or unit, greater than 50% of which was counseling/coordinating care for pelvic abscess.

## 2022-04-07 NOTE — Progress Notes (Signed)
1/15 I spoke to patient via telephone and explained as IMM Letter was explained and acknowledged. Letter will be mailed to address on file.

## 2022-04-07 NOTE — Progress Notes (Signed)
11 Days Post-Op   Subjective/Chief Complaint: No complaints No chest pain or SOB Denies abdominal pain Tolerating po - 100% of breakfast.   Objective: Vital signs in last 24 hours: Temp:  [97.9 F (36.6 C)-99 F (37.2 C)] 98.9 F (37.2 C) (01/15 0750) Pulse Rate:  [82-97] 84 (01/15 0750) Resp:  [16-20] 16 (01/15 0750) BP: (100-115)/(61-79) 102/75 (01/15 0750) SpO2:  [97 %-99 %] 97 % (01/15 0750) Weight:  [51.2 kg] 51.2 kg (01/15 0445) Last BM Date : 04/03/22  Intake/Output from previous day: 01/14 0701 - 01/15 0700 In: 180 [P.O.:180] Out: -  Intake/Output this shift: No intake/output data recorded.  Exam: Awake and alert No respiratory distress Abdomen soft, NT, drain serosang  Lab Results:  Recent Labs    04/05/22 0309 04/06/22 0453  WBC 14.6* 16.5*  HGB 10.7* 10.6*  HCT 31.9* 32.3*  PLT 300 353   BMET No results for input(s): "NA", "K", "CL", "CO2", "GLUCOSE", "BUN", "CREATININE", "CALCIUM" in the last 72 hours.  PT/INR No results for input(s): "LABPROT", "INR" in the last 72 hours. ABG No results for input(s): "PHART", "HCO3" in the last 72 hours.  Invalid input(s): "PCO2", "PO2"  Studies/Results: No results found.  Anti-infectives: Anti-infectives (From admission, onward)    Start     Dose/Rate Route Frequency Ordered Stop   03/27/22 1600  ceFEPIme (MAXIPIME) 2 g in sodium chloride 0.9 % 100 mL IVPB        2 g 200 mL/hr over 30 Minutes Intravenous Every 12 hours 03/27/22 1501 04/01/22 0427   03/27/22 1600  metroNIDAZOLE (FLAGYL) IVPB 500 mg        500 mg 100 mL/hr over 60 Minutes Intravenous Every 12 hours 03/27/22 1501 04/01/22 0456   03/27/22 0600  ceFAZolin (ANCEF) IVPB 2g/100 mL premix        2 g 200 mL/hr over 30 Minutes Intravenous On call to O.R. 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 WBC up more Thoracic  surgery to review CT/chest collection for opinion May require drainage given increasing WBC, pending today.   FEN: SOFT, TPN off - D/C PICC if can get peripheral access ID: [email or web address] x 5d post-op complete  VTE: SCD's, lovenox Foley: none, removed POD#1 Dispo: med-surg  HTN - on norvasc at home, PRN IV metoprolol  GERD - PPI   Jill Alexanders PA-C 04/07/2022

## 2022-04-07 NOTE — Consult Note (Signed)
Reason for Consult: Fluid collection in chest with pleural effusion Referring Physician: Dr. Jimmy Footman Alyssa Peterson is an 72 y.o. female.  HPI: Alyssa Peterson is a 72 year old woman with a past history of hypertension, anemia, and reflux.  She presented on New Year's Day with abdominal pain and vomiting.  Workup revealed a giant hiatal hernia with gastric volvulus.  She underwent a laparoscopic repair with lysis of adhesions, reduction of the herniated contents, deep portion of the gastric volvulus, and repair of the hiatal hernia by Dr. Doylene Canard on 03/27/2022.  Of note the hernia sac in the chest was thickened and densely adherent.  She was treated empirically with cefepime and Flagyl for some spillage of gastric contents during the surgery due to adhesions.  Her diet was slowly advanced.  She did have a productive cough on 03/31/2022.  CT showed possible aspiration.  There was fluid in the mediastinum and roughly the same configuration as the previous hernia sac.    She has remained afebrile and feels relatively well.  However her white blood cell count remained elevated.  Repeat CT on 04/04/2022 showed rim-enhancing fluid collection again consistent with the previous hernia sac.  There also was some fluid in the pelvis and a drain was placed in that area by IR.  Past Medical History:  Diagnosis Date   Anemia    GERD (gastroesophageal reflux disease)    Hypertension    Inguinal hernia    left    Past Surgical History:  Procedure Laterality Date   CYST REMOVAL NECK     DENTAL SURGERY     FEMORAL HERNIA REPAIR Left 07/17/2016   Procedure: LEFT FEMORAL  HERNIA REPAIR WITH MESH;  Surgeon: Manus Rudd, MD;  Location: [facility];  Service: General;  Laterality: Left;   GASTROJEJUNOSTOMY N/A 07/04/2020   Procedure: GASTROSTOMY TUBE;  Surgeon: Berna Bue, MD;  Location: [facility] OR;  Service: General;  Laterality: N/A;   GASTROSTOMY N/A 03/27/2022   Procedure: INSERTION OF  GASTROSTOMY TUBE;  Surgeon: Berna Bue, MD;  Location: [facility] OR;  Service: General;  Laterality: N/A;   HIATAL HERNIA REPAIR N/A 03/27/2022   Procedure: LAPAROSCOPIC REPAIR OF HIATAL HERNIA;  Surgeon: Berna Bue, MD;  Location: [facility] OR;  Service: General;  Laterality: N/A;   LAPAROSCOPIC LYSIS OF ADHESIONS N/A 03/27/2022   Procedure: LAPAROSCOPIC LYSIS OF ADHESIONS;  Surgeon: Berna Bue, MD;  Location: [facility] OR;  Service: General;  Laterality: N/A;   LAPAROTOMY N/A 07/04/2020   Procedure: EXPLORATORY LAPAROTOMY;  Surgeon: Berna Bue, MD;  Location: [facility] OR;  Service: General;  Laterality: N/A;    History reviewed. No pertinent family history.  Social History:  reports that she has never smoked. She has never used smokeless tobacco. She reports that she does not drink alcohol and does not use drugs.  Allergies:  Allergies  Allergen Reactions   Latex Itching    hands   Penicillins Hives and Rash    Medications: Scheduled:  alteplase  2 mg Intracatheter Once   alteplase  2 mg Intracatheter Once   amLODipine  10 mg Oral QHS   Chlorhexidine Gluconate Cloth  6 each Topical Daily   enoxaparin (LOVENOX) injection  40 mg Subcutaneous Q24H   feeding supplement (NEPRO CARB STEADY)  237 mL Oral BID BM   insulin aspart  0-15 Units Subcutaneous Q6H   latanoprost  1 drop Both Eyes QHS   metoCLOPramide (REGLAN) injection  10 mg Intravenous Q6H  pantoprazole (PROTONIX) IV  40 mg Intravenous Q12H   sodium chloride flush  10-40 mL Intracatheter Q12H    Results for orders placed or performed during the hospital encounter of 03/24/22 (from the past 48 hour(s))  Glucose, capillary     Status: Abnormal   Collection Time: 04/05/22  5:41 PM  Result Value Ref Range   Glucose-Capillary 118 (H) 70 - 99 mg/dL    Comment: Glucose reference range applies only to samples taken after fasting for at least 8 hours.  Glucose, capillary     Status: Abnormal   Collection Time: 04/05/22 11:44 PM   Result Value Ref Range   Glucose-Capillary 110 (H) 70 - 99 mg/dL    Comment: Glucose reference range applies only to samples taken after fasting for at least 8 hours.  CBC     Status: Abnormal   Collection Time: 04/06/22  4:53 AM  Result Value Ref Range   WBC 16.5 (H) 4.0 - 10.5 K/uL   RBC 3.64 (L) 3.87 - 5.11 MIL/uL   Hemoglobin 10.6 (L) 12.0 - 15.0 g/dL   HCT 32.3 (L) 36.0 - 46.0 %   MCV 88.7 80.0 - 100.0 fL   MCH 29.1 26.0 - 34.0 pg   MCHC 32.8 30.0 - 36.0 g/dL   RDW 14.6 11.5 - 15.5 %   Platelets 353 150 - 400 K/uL   nRBC 0.0 0.0 - 0.2 %    Comment: Performed at [facility], [place] [address], [place], [place] [address]  Glucose, capillary     Status: Abnormal   Collection Time: 04/06/22  5:18 AM  Result Value Ref Range   Glucose-Capillary 103 (H) 70 - 99 mg/dL    Comment: Glucose reference range applies only to samples taken after fasting for at least 8 hours.  Glucose, capillary     Status: Abnormal   Collection Time: 04/06/22 11:39 AM  Result Value Ref Range   Glucose-Capillary 123 (H) 70 - 99 mg/dL    Comment: Glucose reference range applies only to samples taken after fasting for at least 8 hours.  Glucose, capillary     Status: Abnormal   Collection Time: 04/06/22  5:38 PM  Result Value Ref Range   Glucose-Capillary 118 (H) 70 - 99 mg/dL    Comment: Glucose reference range applies only to samples taken after fasting for at least 8 hours.  Glucose, capillary     Status: Abnormal   Collection Time: 04/06/22 11:32 PM  Result Value Ref Range   Glucose-Capillary 108 (H) 70 - 99 mg/dL    Comment: Glucose reference range applies only to samples taken after fasting for at least 8 hours.  Glucose, capillary     Status: None   Collection Time: 04/07/22 12:47 AM  Result Value Ref Range   Glucose-Capillary 98 70 - 99 mg/dL    Comment: Glucose reference range applies only to samples taken after fasting for at least 8 hours.  Glucose, capillary     Status: Abnormal    Collection Time: 04/07/22  5:09 AM  Result Value Ref Range   Glucose-Capillary 108 (H) 70 - 99 mg/dL    Comment: Glucose reference range applies only to samples taken after fasting for at least 8 hours.  Glucose, capillary     Status: Abnormal   Collection Time: 04/07/22 11:42 AM  Result Value Ref Range   Glucose-Capillary 127 (H) 70 - 99 mg/dL    Comment: Glucose reference range applies only to samples taken after  fasting for at least 8 hours.   Comment 1 Notify RN   CBC     Status: Abnormal   Collection Time: 04/07/22 12:38 PM  Result Value Ref Range   WBC 14.9 (H) 4.0 - 10.5 K/uL   RBC 3.64 (L) 3.87 - 5.11 MIL/uL   Hemoglobin 10.6 (L) 12.0 - 15.0 g/dL   HCT 32.6 (L) 36.0 - 46.0 %   MCV 89.6 80.0 - 100.0 fL   MCH 29.1 26.0 - 34.0 pg   MCHC 32.5 30.0 - 36.0 g/dL   RDW 14.5 11.5 - 15.5 %   Platelets 409 (H) 150 - 400 K/uL   nRBC 0.0 0.0 - 0.2 %    Comment: Performed at [facility], [place] [address] [place], [place] [address]  Protime-INR     Status: None   Collection Time: 04/07/22  4:25 PM  Result Value Ref Range   Prothrombin Time 14.7 11.4 - 15.2 seconds   INR 1.2 0.8 - 1.2    Comment: (NOTE) INR goal varies based on device and disease states. Performed at [facility], [place] [address], [place], [place] [address]     No results found.  Review of Systems Blood pressure 119/80, pulse (!) 108, temperature 98.9 F (37.2 C), temperature source Oral, resp. rate 16, height 5\' 3"  (1.6 m), weight 51.2 kg, SpO2 98 %. Physical Exam Vitals reviewed.  Constitutional:      General: She is not in acute distress. HENT:     Head: Normocephalic and atraumatic.  Eyes:     Pupils: Pupils are equal, round, and reactive to light.  Cardiovascular:     Rate and Rhythm: Regular rhythm.     Pulses: Normal pulses.     Heart sounds: Normal heart sounds. No murmur heard.    No friction rub. No gallop.  Pulmonary:     Effort: Pulmonary effort is normal. No respiratory distress.      Breath sounds: No wheezing.     Comments: Diminished breath sounds right base Abdominal:     General: Abdomen is flat. A surgical scar is present.     Palpations: Abdomen is soft.  Musculoskeletal:     Cervical back: Neck supple.  Lymphadenopathy:     Cervical: No cervical adenopathy.  Skin:    General: Skin is warm and dry.  Neurological:     General: No focal deficit present.     Mental Status: She is alert and oriented to person, place, and time.     Cranial Nerves: No cranial nerve deficit.     Motor: No weakness.     Assessment/Plan: Dominic Rhome is a 72 year old woman who presented with an incarcerated paraesophageal hernia with a gastric volvulus.  She underwent laparoscopic repair on 03/27/2022.  In the postoperative period she has had a mildly elevated white blood cell count.  Workup is revealed a pelvic fluid collection which was drained.  Also revealed a fluid collection in the mediastinum consistent with fluid within the hernia sac.  There is a small pleural effusion adjacent to that.  She does not look toxic at all.  She is afebrile off antibiotics.  I doubt that fluid is infected as she would likely be septic if it was.  However there is a significant amount of fluid there which is causing some mild displacement of the heart and volume loss for the right lung.  I think the best option for her would be if that fluid collection could  be drained percutaneously.  It would cross into the pleural space but that is not a significant concern.  If IR is unable to drain percutaneously would need a VATS, which would obviously significantly prolong her hospital stay and recovery time.  Will follow-up  Loreli Slot 04/07/2022, 5:04 PM

## 2022-04-07 NOTE — Care Management Important Message (Signed)
Important Message  Patient Details  Name: Alyssa Peterson MRN: [id number] Date of Birth: 06-03-1950   Medicare Important Message Given:  Other (see comment)     Hannah Beat 04/07/2022, 10:30 AM

## 2022-04-07 NOTE — Progress Notes (Signed)
Mobility Specialist - Progress Note   04/07/22 1622  Mobility  Activity Ambulated with assistance in hallway  Level of Assistance Standby assist, set-up cues, supervision of patient - no hands on  Assistive Device Front wheel walker  Distance Ambulated (ft) 210 ft  Activity Response Tolerated well  Mobility Referral Yes  $Mobility charge 1 Mobility    Pt received in bed agreeable to mobility. No complaints throughout. Left in bed w/ call bell in reach.   [place] Specialist Please contact via SecureChat or Rehab office at [phone]

## 2022-04-08 ENCOUNTER — Inpatient Hospital Stay (HOSPITAL_COMMUNITY): Payer: Medicare Other

## 2022-04-08 DIAGNOSIS — K651 Peritoneal abscess: Secondary | ICD-10-CM | POA: Diagnosis not present

## 2022-04-08 LAB — GLUCOSE, CAPILLARY
Glucose-Capillary: 102 mg/dL — ABNORMAL HIGH (ref 70–99)
Glucose-Capillary: 86 mg/dL (ref 70–99)
Glucose-Capillary: 130 mg/dL — ABNORMAL HIGH (ref 70–99)
Glucose-Capillary: 160 mg/dL — ABNORMAL HIGH (ref 70–99)

## 2022-04-08 MED ORDER — ALTEPLASE 2 MG IJ SOLR: 2.0000 mg | Freq: Once | INTRAMUSCULAR | Status: DC

## 2022-04-08 MED ORDER — FENTANYL CITRATE (PF) 100 MCG/2ML IJ SOLN
INTRAMUSCULAR | Status: AC | PRN
  Administered 2022-04-08: 50 ug via INTRAVENOUS

## 2022-04-08 MED ORDER — POLYETHYLENE GLYCOL 3350 17 G PO PACK: 17.0000 g | PACK | Freq: Every day | ORAL | Status: DC | PRN

## 2022-04-08 MED ORDER — FLUCONAZOLE IN SODIUM CHLORIDE 400-0.9 MG/200ML-% IV SOLN
400.0000 mg | INTRAVENOUS | Status: DC
  Administered 2022-04-08 – 2022-04-09 (×2): 400 mg via INTRAVENOUS
  Filled 2022-04-08 (×2): qty 200

## 2022-04-08 MED ORDER — SODIUM CHLORIDE 0.9% FLUSH
10.0000 mL | Freq: Three times a day (TID) | INTRAVENOUS | Status: DC
  Administered 2022-04-08 – 2022-04-11 (×10): 10 mL via INTRAPLEURAL

## 2022-04-08 MED ORDER — MIDAZOLAM HCL 2 MG/2ML IJ SOLN
INTRAMUSCULAR | Status: AC
  Filled 2022-04-08: qty 2

## 2022-04-08 MED ORDER — DOCUSATE SODIUM 100 MG PO CAPS
100.0000 mg | ORAL_CAPSULE | Freq: Two times a day (BID) | ORAL | Status: DC
  Administered 2022-04-08 – 2022-04-11 (×7): 100 mg via ORAL
  Filled 2022-04-08 (×7): qty 1

## 2022-04-08 MED ORDER — SODIUM CHLORIDE 0.9% FLUSH
5.0000 mL | Freq: Three times a day (TID) | INTRAVENOUS | Status: DC
  Administered 2022-04-08 – 2022-04-11 (×10): 5 mL

## 2022-04-08 MED ORDER — SODIUM CHLORIDE 0.9 % IV SOLN
2.0000 g | INTRAVENOUS | Status: DC
  Administered 2022-04-08 – 2022-04-09 (×2): 2 g via INTRAVENOUS
  Filled 2022-04-08 (×2): qty 20

## 2022-04-08 MED ORDER — LIDOCAINE HCL 1 % IJ SOLN
10.0000 mL | Freq: Once | INTRAMUSCULAR | Status: DC
  Filled 2022-04-08: qty 10

## 2022-04-08 MED ORDER — METRONIDAZOLE 500 MG PO TABS
500.0000 mg | ORAL_TABLET | Freq: Two times a day (BID) | ORAL | Status: DC
  Administered 2022-04-08 – 2022-04-10 (×4): 500 mg via ORAL
  Filled 2022-04-08 (×5): qty 1

## 2022-04-08 MED ORDER — MIDAZOLAM HCL 2 MG/2ML IJ SOLN
INTRAMUSCULAR | Status: AC | PRN
  Administered 2022-04-08: 1 mg via INTRAVENOUS

## 2022-04-08 MED ORDER — FENTANYL CITRATE (PF) 100 MCG/2ML IJ SOLN
INTRAMUSCULAR | Status: AC
  Filled 2022-04-08: qty 2

## 2022-04-08 NOTE — Procedures (Signed)
Interventional Radiology Procedure Note  Procedure: CT guided right pleural drainage catheter insertion  Complications: None  Estimated Blood Loss: None  Findings: 12 Fr drain placed in right subpulmonic pleural collection yielding dark, bloody fluid. Sample sent for culture. Tube connected to [place] pleur-evac which will be connected to wall suction in room at -20 cm H2O.  Will follow.  Venetia Night. Kathlene Cote, M.D Pager:  [phone]

## 2022-04-08 NOTE — Consult Note (Addendum)
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 Completed course of cefepime and metronidazole post-operatively and not currently on antibiotics. Start Ceftriaxone/metronidazole and fluconazole while awaiting additional cultures. Ideally drains will help with source control. Route/duration of therapy to be determined pending culture results and source control. Remaining medical and supportive care per General Surgery   PLAN:  Start fluconazole and ceftriaxone/metronidazole.  Post surgical wound care and drain management per IR and General Surgery. Monitor cultures for organism identification. Remaining medical and supportive care per Primary Team.    Principal Problem:   Hiatal hernia Active Problems:   Protein-calorie malnutrition, severe    alteplase  2 mg Intracatheter Once   alteplase  2 mg Intracatheter Once   alteplase  2 mg Intracatheter Once   alteplase  2 mg Intracatheter Once   amLODipine  10 mg Oral QHS   Chlorhexidine Gluconate Cloth  6 each Topical Daily   docusate sodium  100 mg Oral BID   enoxaparin (LOVENOX) injection  40 mg Subcutaneous Q24H   feeding supplement (NEPRO CARB STEADY)  237 mL Oral BID BM   insulin aspart  0-15 Units Subcutaneous Q6H   latanoprost  1 drop Both Eyes QHS    metoCLOPramide (REGLAN) injection  10 mg Intravenous Q6H   pantoprazole (PROTONIX) IV  40 mg Intravenous Q12H   sodium chloride flush  10-40 mL Intracatheter Q12H     HPI: Alyssa Peterson is a 72 y.o. female with previous medical history of GERD and hypertension admitted from home on 03/24/22 with generalized abdominal pain, nausea and vomiting.   Alyssa Peterson initially underwent surgery in April 2022 for incarcerated paraesophageal hernia with gastric volvulus and secondary perforation x 2 with diffuse feculent peritenonitis. Has been doing well until 03/24/22 when she was having nausea, vomiting and generalized abdominal pain. CT abdomen/pelvis with large hiatal hernia with chronic organ-oaxial gastric volvulus similar to prior studies. 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 Following surgery was started on Cefepime and Metronidazole. Increased levels of coughing on 1/8  with chest x-ray showing right mid to lower hemithorax opacity and left lung base opacity consistent with pleural effusion or atelectasis. Follow up CT with complete collapse of the righ middle and lower lobe and partial collapse of the left lower lobe with mucus plugging with concern for aspiration; and marked stranding and edema in the upper abdomen with partial herniation of the right lobe of the liver concerning for perforation with upper GI study without concern. Continued to have increasing WBC count with CT chest/abdomen/pelvis showing a 1.0 x 9.1 cm rim enhancing fluid collection in the right mid chest consistent with isolated hernia sac; and interval development of rim enhancing pelvic fluid collections with the largest measuring 7.9 x 2.8  cm and findings suspicious for abscess. IR placed right transgluteal pelvic fluid collection drain. Today placed percutaneous pigtail drain into the  loculated subpulmonic right pleural fluid collection. Dark bloody fluid was returned and sent for culture.   Ms. Mom has remained afebrile since admission with stable leukocytosis with last WBC Ccount of 14.9. She completed 5 day course of cefepime and metronidazole post-surgery. Drain fluid from 04/04/22 has grown candida albicans. Additional culture data is pending.     Review of Systems: Review of Systems  Constitutional:  Negative for chills, fever and weight loss.  Respiratory:  Negative for cough, shortness of breath and wheezing.   Cardiovascular:  Negative for chest pain and leg swelling.  Skin:  Negative for rash.     Past Medical History:  Diagnosis Date   Anemia    GERD (gastroesophageal reflux disease)    Hypertension    Inguinal hernia    left    Social History   Tobacco Use   Smoking status: Never   Smokeless tobacco: Never  Vaping Use   Vaping Use: Never used  Substance Use Topics   Alcohol use: No    Alcohol/week: 0.0 standard drinks of alcohol   Drug use: No    History reviewed. No pertinent family history.  Allergies  Allergen Reactions   Latex Itching    hands   Penicillins Hives and Rash    OBJECTIVE: Blood pressure 103/74, pulse 93, temperature 98.4 F (36.9 C), temperature source Oral, resp. rate 18, height 5\' 3"  (1.6 m), weight 50.7 kg, SpO2 97 %.  Physical Exam Constitutional:      General: She is not in acute distress.    Appearance: She is well-developed.  Cardiovascular:     Rate and Rhythm: Normal rate and regular rhythm.     Heart sounds: Normal heart sounds.  Pulmonary:     Effort: Pulmonary effort is normal.     Breath sounds: Normal breath sounds.  Skin:    General: Skin is warm and dry.  Neurological:     Mental Status: She is alert and oriented to person, place, and time.  Psychiatric:        Behavior: Behavior normal.        Thought Content: Thought content normal.        Judgment: Judgment normal.     Lab  Results Lab Results  Component Value Date   WBC 14.9 (H) 04/07/2022   HGB 10.6 (L) 04/07/2022   HCT 32.6 (L) 04/07/2022   MCV 89.6 04/07/2022   PLT 409 (H) 04/07/2022    Lab Results  Component Value Date   CREATININE 0.63 04/04/2022   BUN 10 04/04/2022   NA 134 (L) 04/04/2022   K 3.9 04/04/2022   CL 103 04/04/2022   CO2 25 04/04/2022    Lab Results  Component Value Date   ALT 19 04/03/2022   AST 19 04/03/2022   ALKPHOS 73 04/03/2022   BILITOT 0.9 04/03/2022     Microbiology: Recent Results (from the past 240 hour(s))  Aerobic/Anaerobic Culture w Gram Stain (surgical/deep wound)     Status: None (Preliminary result)   Collection Time: 04/04/22  3:57 PM   Specimen: Abscess  Result Value Ref Range Status   Specimen Description ABSCESS PELVIS  Final   Special Requests NONE  Final   Gram Stain   Final    ABUNDANT WBC PRESENT, PREDOMINANTLY PMN NO ORGANISMS SEEN Performed at [facility], [address] [place], [place]  [place]    Culture   Final    RARE CANDIDA ALBICANS NO ANAEROBES ISOLATED; CULTURE IN PROGRESS FOR 5 DAYS    Report Status PENDING  Incomplete     Terri Piedra, NP [facility]  04/08/2022  12:43 PM

## 2022-04-08 NOTE — Progress Notes (Signed)
Nutrition Follow-up  DOCUMENTATION CODES:  Severe malnutrition in context of chronic illness  INTERVENTION:  Encourage adequate PO intake Continuing Nepro Shakes po BID, each supplement provides 425 kcal and 19 grams protein MVI with minerals daily  NUTRITION DIAGNOSIS:  Severe Malnutrition related to chronic illness as evidenced by severe fat depletion, severe muscle depletion. - Remains applicable  GOAL:  Patient will meet greater than or equal to 90% of their needs -progressing via diet advancement and nutrition supplements  MONITOR:   PO intake, Labs, Diet advancement, Weight trends, I & O's  REASON FOR ASSESSMENT:   Malnutrition Screening Tool    ASSESSMENT:   72 y.o. female admits related to abdominal pain and vomiting. PMH includes: anemia, GERD, HTN, inguinal hernia. Pt is currently receiving medical management related to hiatal hernia.  01/04: s/p lap lysis of adhesions, reduction of incarcerated giant paraesophageal hernia and detorsion of gastric volvulue, wedge resection, fundus of stomach, primary repair of hiatal hernia, G-tube insertion for venting, repair of umbilical hernia, upper endoscopy   01/12: TPN discontinued 01/16: s/p R pleural drain placement  Pt off unit at time of visit for drain placement. NPO this morning for that reason.  No documented meal completions since 01/14. Ate 75% of that meal. Nepro shakes ordered BID. Per MAR, pt has received 6 supplements thus far. None given today d/t NPO status.   Medications: colace, SSI 0-15 units q6h, reglan, protonix  Labs reviewed  CBG's 91-128 x24 hours  Diet Order:   Diet Order             DIET SOFT Room service appropriate? Yes; Fluid consistency: Thin  Diet effective now                   EDUCATION NEEDS:   Education needs have been addressed  Skin:  Skin Assessment: Reviewed RN Assessment  Last BM:  1/13 (type 5)  Height:   Ht Readings from Last 1 Encounters:  03/27/22 5\' 3"   (1.6 m)    Weight:   Wt Readings from Last 1 Encounters:  04/08/22 50.7 kg   BMI:  Body mass index is 19.8 kg/m.  Estimated Nutritional Needs:   Kcal:  1700-1900  Protein:  85-100g  Fluid:  >/=1.7L  Clayborne Dana, RDN, LDN Clinical Nutrition'

## 2022-04-08 NOTE — Progress Notes (Addendum)
[address]       [place] [address]             [phone]       12 Days Post-Op Procedure(s) (LRB): LAPAROSCOPIC LYSIS OF ADHESIONS (N/A) LAPAROSCOPIC REPAIR OF HIATAL HERNIA (N/A) INSERTION OF GASTROSTOMY TUBE (N/A)  Subjective: Patient in good spirits this am. She has no specific complaint  Objective: Vital signs in last 24 hours: Temp:  [98.6 F (37 C)-98.9 F (37.2 C)] 98.6 F (37 C) (01/16 0403) Pulse Rate:  [78-108] 78 (01/16 0403) Resp:  [16-17] 17 (01/16 0403) BP: (105-119)/(70-80) 107/76 (01/16 0403) SpO2:  [98 %-100 %] 100 % (01/16 0403) Weight:  [50.7 kg] 50.7 kg (01/16 0500)     Intake/Output from previous day: 01/15 0701 - 01/16 0700 In: -  Out: 15 [Drains:15]   Physical Exam:  Cardiovascular: RRR Pulmonary: Clear to auscultation bilaterally Abdomen: Soft, non tender, bowel sounds present. Extremities: No  lower extremity edema. Wound: Pelvic drain dressing is clean and dry.   Lab Results: CBC: Recent Labs    04/06/22 0453 04/07/22 1238  WBC 16.5* 14.9*  HGB 10.6* 10.6*  HCT 32.3* 32.6*  PLT 353 409*   BMET: No results for input(s): "NA", "K", "CL", "CO2", "GLUCOSE", "BUN", "CREATININE", "CALCIUM" in the last 72 hours.  PT/INR:  Recent Labs    04/07/22 1625  LABPROT 14.7  INR 1.2   ABG:  INR: Will add last result for INR, ABG once components are confirmed Will add last 4 CBG results once components are confirmed  Assessment/Plan:  1. CV - SR. On Amlodipine 10 mg at hs 2.  Pulmonary - On room air. 3. IR to place drain into hernia sac collection;if unsuccessful, would need VATS 4. GI-NPO for procedure this am;per general surgery  Jlynn Langille M ZimmermanPA-C 04/08/2022,8:14 AM

## 2022-04-08 NOTE — Consult Note (Signed)
Chief Complaint: Patient was seen in consultation today for right chest collection drain placement Chief Complaint  Patient presents with   Abdominal Pain   at the request of Dr Leonarda Salon  Supervising Physician: Ruthann Cancer  Patient Status: [facility] - In-pt  History of Present Illness: Alyssa Peterson is a 72 y.o. female   HTN; Anemia; reflux Giant hiatal hernia and gastric volvulus Laparoscopic repair in IR with Dr Georgiann Cocker 03/27/22 Post op leukocytosis : hernial sac collection and pelvic abscess IR drain placed 04/04/22  Dr Roxan Hockey note 1/15: She underwent laparoscopic repair on 03/27/2022.  In the postoperative period she has had a mildly elevated white blood cell count.  Workup is revealed a pelvic fluid collection which was drained.  Also revealed a fluid collection in the mediastinum consistent with fluid within the hernia sac.  There is a small pleural effusion adjacent to that. She does not look toxic at all.  She is afebrile off antibiotics.  I doubt that fluid is infected as she would likely be septic if it was.  However there is a significant amount of fluid there which is causing some mild displacement of the heart and volume loss for the right lung. I think the best option for her would be if that fluid collection could be drained percutaneously.  It would cross into the pleural space but that is not a significant concern. If IR is unable to drain percutaneously would need a VATS, which would obviously significantly prolong her hospital stay and recovery time.  Dr Serafina Royals has reviewed imaging and approves procedure Scheduled now for Rt chest collection drain placement  Past Medical History:  Diagnosis Date   Anemia    GERD (gastroesophageal reflux disease)    Hypertension    Inguinal hernia    left    Past Surgical History:  Procedure Laterality Date   CYST REMOVAL NECK     DENTAL SURGERY     FEMORAL HERNIA REPAIR Left 07/17/2016   Procedure: LEFT  FEMORAL  HERNIA REPAIR WITH MESH;  Surgeon: Donnie Mesa, MD;  Location: [place];  Service: General;  Laterality: Left;   GASTROJEJUNOSTOMY N/A 07/04/2020   Procedure: GASTROSTOMY TUBE;  Surgeon: Clovis Riley, MD;  Location: [place];  Service: General;  Laterality: N/A;   GASTROSTOMY N/A 03/27/2022   Procedure: INSERTION OF GASTROSTOMY TUBE;  Surgeon: Clovis Riley, MD;  Location: [place];  Service: General;  Laterality: N/A;   HIATAL HERNIA REPAIR N/A 03/27/2022   Procedure: LAPAROSCOPIC REPAIR OF HIATAL HERNIA;  Surgeon: Clovis Riley, MD;  Location: [place];  Service: General;  Laterality: N/A;   LAPAROSCOPIC LYSIS OF ADHESIONS N/A 03/27/2022   Procedure: LAPAROSCOPIC LYSIS OF ADHESIONS;  Surgeon: Clovis Riley, MD;  Location: [place];  Service: General;  Laterality: N/A;   LAPAROTOMY N/A 07/04/2020   Procedure: EXPLORATORY LAPAROTOMY;  Surgeon: Clovis Riley, MD;  Location: [place];  Service: General;  Laterality: N/A;    Allergies: Latex and Penicillins  Medications: Prior to Admission medications   Medication Sig Start Date End Date Taking? Authorizing Provider  amLODipine (NORVASC) 10 MG tablet TAKE 1 TABLET(10 MG) BY MOUTH DAILY Patient taking differently: Take 10 mg by mouth at bedtime. TAKE 1 TABLET(10 MG) BY MOUTH DAILY 01/07/22  Yes Gildardo Pounds, NP  calcium carbonate (TUMS - DOSED IN MG ELEMENTAL CALCIUM) 500 MG chewable tablet Chew 1 tablet by mouth daily.   Yes [provider]  Andrew Au  0.01 % SOLN Place 1 drop into both eyes at bedtime. 01/28/22  Yes [provider]     History reviewed. No pertinent family history.  Social History   Socioeconomic History   Marital status: Divorced    Spouse name: Not on file   Number of children: Not on file   Years of education: Not on file   Highest education level: Not on file  Occupational History   Not on file  Tobacco Use   Smoking status: Never   Smokeless tobacco: Never  Vaping  Use   Vaping Use: Never used  Substance and Sexual Activity   Alcohol use: No    Alcohol/week: 0.0 standard drinks of alcohol   Drug use: No   Sexual activity: Not on file  Other Topics Concern   Not on file  Social History Narrative   Not on file   Social Determinants of Health   Financial Resource Strain: Not on file  Food Insecurity: No Food Insecurity (03/24/2022)   Hunger Vital Sign    Worried About Running Out of Food in the Last Year: Never true    Ran Out of Food in the Last Year: Never true  Transportation Needs: No Transportation Needs (03/24/2022)   PRAPARE - Hydrologist (Medical): No    Lack of Transportation (Non-Medical): No  Physical Activity: Not on file  Stress: Not on file  Social Connections: Not on file    Review of Systems: A 12 point ROS discussed and pertinent positives are indicated in the HPI above.  All other systems are negative.  Review of Systems  Constitutional:  Positive for activity change. Negative for fever.  Respiratory:  Negative for cough and shortness of breath.   Cardiovascular:  Negative for chest pain.  Gastrointestinal:  Positive for abdominal pain.  Neurological:  Positive for weakness.  Psychiatric/Behavioral:  Negative for behavioral problems and confusion.     Vital Signs: BP 107/76 (BP Location: Left Arm)   Pulse 78   Temp 98.6 F (37 C) (Oral)   Resp 17   Ht 5\' 3"  (1.6 m)   Wt 111 lb 12.4 oz (50.7 kg)   SpO2 100%   BMI 19.80 kg/m    Physical Exam Cardiovascular:     Rate and Rhythm: Normal rate and regular rhythm.  Pulmonary:     Breath sounds: Normal breath sounds.  Abdominal:     Tenderness: There is abdominal tenderness.  Skin:    General: Skin is warm.  Neurological:     Mental Status: She is oriented to person, place, and time.  Psychiatric:        Behavior: Behavior normal.     Imaging: CT GUIDED PERITONEAL/RETROPERITONEAL FLUID DRAIN BY PERC CATH  Result Date:  04/04/2022 INDICATION: Postop pelvic fluid collection, concern for abscess EXAM: RIGHT TRANS GLUTEAL CT PELVIC FLUID COLLECTION DRAIN TECHNIQUE: Multidetector CT imaging of the pelvis was performed following the standard protocol without IV contrast. RADIATION DOSE REDUCTION: This exam was performed according to the departmental dose-optimization program which includes automated exposure control, adjustment of the mA and/or kV according to patient size and/or use of iterative reconstruction technique. MEDICATIONS: The patient is currently admitted to the hospital and receiving intravenous antibiotics. The antibiotics were administered within an appropriate time frame prior to the initiation of the procedure. ANESTHESIA/SEDATION: Moderate (conscious) sedation was employed during this procedure. A total of Versed 1.0 mg and Fentanyl 50 mcg was administered intravenously by the radiology nurse.  Total intra-service moderate Sedation Time: 10 minutes. The patient's level of consciousness and vital signs were monitored continuously by radiology nursing throughout the procedure under my direct supervision. COMPLICATIONS: None immediate. PROCEDURE: Informed written consent was obtained from the patient after a thorough discussion of the procedural risks, benefits and alternatives. All questions were addressed. Maximal Sterile Barrier Technique was utilized including caps, mask, sterile gowns, sterile gloves, sterile drape, hand hygiene and skin antiseptic. A timeout was performed prior to the initiation of the procedure. Previous imaging reviewed. Patient positioned prone. Noncontrast localization CT performed. The posterior pelvic cul-de-sac fluid collection was localized and marked for a right trans gluteal approach. Under sterile conditions and local anesthesia, an 18 gauge 10 cm access needle was advanced from a right trans gluteal approach into the fluid collection. Needle position confirmed with CT. Syringe aspiration  yielded thin blood tinged fluid. Guidewire inserted followed by tract dilatation insert a 10 French drain. Drain catheter position confirmed with CT. Syringe aspiration yielded 30 cc thin blood tinged slightly exudative fluid. Sample sent for culture. Catheter secured with Prolene suture and connected to external suction bulb. Sterile dressing applied. No immediate complication. Patient tolerated the procedure well. IMPRESSION: Successful CT-guided right transgluteal pelvic fluid collection drain placement Electronically Signed   By: Judie Petit.  Shick M.D.   On: 04/04/2022 16:30   CT CHEST ABDOMEN PELVIS W CONTRAST  Addendum Date: 04/04/2022   ADDENDUM REPORT: 04/04/2022 11:30 ADDENDUM: Correction: IMPRESSION: 1. A 11.0 x 9.1 centimeters rim enhancing fluid collection in the RIGHT mid chest, consistent with isolated hernia sac. Locules of gas are identified within this collection. These results will be called to the ordering clinician or representative by the Radiologist Assistant, and communication documented in the PACS or [employer]. Electronically Signed   By: Norva Pavlov M.D.   On: 04/04/2022 11:30   Result Date: 04/04/2022 CLINICAL DATA:  Rule out intra-abdominal abscess. Re-evaluate fluid collection in the chest from hiatal hernia sac. Patient had hernia repair, gastrostomy tube insertion, lysis of adhesions for incarcerated paraesophageal hernia on 03/27/2022. EXAM: CT CHEST, ABDOMEN, AND PELVIS WITH CONTRAST TECHNIQUE: Multidetector CT imaging of the chest, abdomen and pelvis was performed following the standard protocol during bolus administration of intravenous contrast. RADIATION DOSE REDUCTION: This exam was performed according to the departmental dose-optimization program which includes automated exposure control, adjustment of the mA and/or kV according to patient size and/or use of iterative reconstruction technique. CONTRAST:  49mL OMNIPAQUE IOHEXOL 350 MG/ML SOLN COMPARISON:  CT of the  abdomen and pelvis on 03/24/2022, UPPER GI on 04/01/2022 and CT chest on 03/31/2022 FINDINGS: CT CHEST FINDINGS Cardiovascular: Heart size is normal. No significant coronary artery calcifications or atherosclerosis of the thoracic aorta. Patient has RIGHT PICC line, tip to the superior vena cava. Mediastinum/Nodes: Scattered calcifications and small low-attenuation lesions are identified within the thyroid, largest measuring 7 millimeters in the LEFT lobe (image 31 of series 3). Esophagus is unremarkable. Oral contrast empties into the stomach. Lungs/Pleura: Within the RIGHT mid chest, there is a rim enhancing fluid collection containing locules of gas, consistent with isolated hernia sac. Collection measures 11.0 x 9.1 centimeters on image 33 of series 3. Locules of gas are identified within this collection. In addition, there is RIGHT pleural effusion similar in volume compared to prior study. RIGHT LOWER and RIGHT middle lobe consolidation/atelectasis again noted. There is no pneumothorax. Airways are patent. Musculoskeletal: No chest wall mass or suspicious bone lesions identified. CT ABDOMEN PELVIS FINDINGS Hepatobiliary: Benign hepatic cyst  measuring 1.4 centimeters in the RIGHT hepatic dome. No further imaging is recommended. Otherwise the liver is unremarkable. Gallbladder is present. Pancreas: Unremarkable. No pancreatic ductal dilatation or surrounding inflammatory changes. Spleen: Normal in size without focal abnormality. Adrenals/Urinary Tract: Adrenal glands are normal. Kidneys are symmetric in size and enhancement/excretion. No hydronephrosis. Ureters are unremarkable. The bladder and visualized portion of the urethra are normal. Stomach/Bowel: The gastroesophageal junction is patent. There are surgical clips along the proximal greater curvature of the stomach. There is postoperative thickening of the proximal stomach wall. No residual hernia is identified. Interval placement of gastrostomy tube in the  appropriate location. Small bowel loops are normal in appearance. There are numerous colonic diverticula, many of which contain contrast. There is mild mesenteric edema, limiting evaluation for acute diverticulitis, though none is seen. The appendix is well seen and normal in appearance. Vascular/Lymphatic: No significant vascular findings are present. No enlarged abdominal or pelvic lymph nodes. Reproductive: Uterus is present and contains numerous fibroids. There has been interval development rim enhancing pelvic fluid collections. The largest is in the cul-de-sac and extends into the RIGHT adnexal region, measuring 7.9 x 3.8 centimeters. A second in the LEFT adnexal region is 2.9 x 2.7 centimeters on image 97 of series 3. This collection is posterior to the LEFT ovary. Other: Postoperative changes in the anterior abdominal wall. Mild body wall edema. Musculoskeletal: Mild degenerative changes in the lumbosacral spine. IMPRESSION: 1. A 1.0 x 9.1 centimeters rim enhancing fluid collection in the RIGHT mid chest, consistent with isolated hernia sac. Locules of gas are identified within this collection. 2. Stable RIGHT pleural effusion and RIGHT LOWER lobe and RIGHT middle lobe consolidation/atelectasis. 3. Interval placement of gastrostomy tube in appropriate location. 4. Interval development of rim enhancing pelvic fluid collections, largest in the cul-de-sac and extends into the RIGHT adnexal region, measuring 7.9 x 3.8 centimeters. Findings are suspicious for abscesses. 5. Colonic diverticulosis. 6. Fibroid uterus. 7. Mild body wall edema. 8. Subcentimeter incidental left thyroid nodule. No follow-up imaging is recommended. Reference: J Am Coll Radiol. 2015 Feb;12(2): 143-50 Electronically Signed: By: Nolon Nations M.D. On: 04/04/2022 11:19   DG UGI W SINGLE CM (SOL OR THIN BA)  Result Date: 04/01/2022 CLINICAL DATA:  Patient with incarcerated paraesophageal hernia status post lysis of adhesions, hernia  repair, gastrostomy tube insertion on January 4th 2024. Request is for single upper GI for further evaluation possible hernia EXAM: DG UGI W SINGLE CM TECHNIQUE: Scout radiograph was obtained. Single contrast examination was performed using thin liquid barium. This exam was performed by Rushie Nyhan, and was supervised and interpreted by Dr. Kalman Jewels. FLUOROSCOPY: Radiation Exposure Index (as provided by the fluoroscopic device): 22.0 mGy Kerma COMPARISON:  None Available. FINDINGS: Scout Radiograph: Within normal limits. Esophagus:  Normal appearance. 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 Gastroesophageal reflux:  None visualized. Ingested 92mm barium tablet:  Not given Stomach: Postoperative changes from hiatal hernia repair. No recurrent hernia. Prior CT scan shows a large right pleural space complex fluid collection likely a postoperative hematoma. Gastric emptying: Normal. Duodenum:  Normal appearance. Other:  None. IMPRESSION: 1.  No mass, stricture, or gastroesophageal reflux noted 2. 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  No hiatal hernia. Electronically Signed   By: Marijo Sanes M.D.   On: 04/01/2022 12:27   CT CHEST WO CONTRAST  Result Date: 03/31/2022 CLINICAL DATA:  Pneumonia, complication suspected. X-ray done. Status post hiatal hernia repair. Coughing with  fluid retention in the hernia sac. Evaluation for better idea if there is more going on the simple effusion. EXAM: CT CHEST WITHOUT CONTRAST TECHNIQUE: Multidetector CT imaging of the chest was performed following the standard protocol without IV contrast. RADIATION DOSE REDUCTION: This exam was performed according to the departmental dose-optimization program which includes automated exposure control, adjustment of the mA and/or kV according to patient size and/or use of iterative reconstruction technique. COMPARISON:  Radiographs  earlier today and CT abdomen and pelvis 03/24/2022 FINDINGS: Evaluation is limited by lack of IV contrast. Cardiovascular: Heart size upper limits of normal. Small pericardial effusion. Right PICC tip in the low SVC. Mediastinum/Nodes: No definite adenopathy contrast. Fluid level within the esophagus reaching near the thoracic inlet. The mid and distal esophagus are poorly visualized. Redemonstrated large hiatal hernia with intrathoracic stomach in the right hemithorax. Lungs/Pleura: Complete collapse of the right middle and lower lobes with mucous plugging. Small-moderate low-density right pleural effusion (Hounsfield units approximately 9). Small left pleural effusion and partial atelectasis of the left lower lobe with mucous plugging. Atelectasis in the lingula. Upper Abdomen: New surgical clips near the expected location of the gastroesophageal junction likely related to hernia repair on 03/27/2022. Partially visualized stranding and edema about the intraabdominal portion of the stomach. New herniation of the anterior left lobe of the liver along with fat and locules of gas into a subxiphoid hernia in the anterior chest/abdominal wall Musculoskeletal: No acute osseous abnormality. IMPRESSION: 1. Redemonstrated large hiatal hernia with intrathoracic portions of the stomach in the right hemithorax. 2. Complete collapse of the right middle and lower lobe and partial collapse of the left lower lobe with mucous plugging. This is favored due to aspiration given large hernia and fluid level in the esophagus. 3. Small-moderate right and small left pleural effusions are the density of simple fluid. 4. Marked stranding and edema in the upper abdomen about the intraabdominal portion of the stomach. Partial herniation of the right lobe of the liver along with fat and locules of gas into a subxiphoid hernia. These findings may be postoperative in nature however if there is concern for perforation, CT chest and abdomen with  IV and oral contrast is recommended. This case was discussed by telephone at the time of interpretation on 03/31/2022 at 11:53 pm to provider Dr. Bobbye Morton, who verbally acknowledged these results. Electronically Signed   By: Placido Sou M.D.   On: 03/31/2022 23:54   DG CHEST PORT 1 VIEW  Result Date: 03/31/2022 CLINICAL DATA:  Pleural effusion.  Follow-up exam. EXAM: PORTABLE CHEST 1 VIEW COMPARISON:  03/28/2022. FINDINGS: Opacity obscures the lower half of the right hemithorax, unchanged from the prior exam. There is also opacity at the left lung base obscuring the hemidiaphragm, also unchanged. Remainder of the lungs is clear. No pneumothorax. Right PICC stable and well positioned. IMPRESSION: 1. No significant change from the most recent prior exam. 2. Right mid to lower hemithorax opacity and left lung base opacity consistent with a combination of pleural effusions and atelectasis and/or pneumonia. No evidence of pulmonary edema. Electronically Signed   By: Lajean Manes M.D.   On: 03/31/2022 14:15   DG CHEST PORT 1 VIEW  Result Date: 03/29/2022 CLINICAL DATA:  Shortness of breath. Laparoscopic repair of hiatal hernia. EXAM: PORTABLE CHEST 1 VIEW COMPARISON:  CT abdomen and pelvis 03/24/2022 FINDINGS: There is opacification in the inferior right hemithorax which may be related to combination of pleural fluid, atelectasis/airspace disease or residual hernia. The left lung  is clear. There is no pneumothorax or mediastinal shift. The cardiomediastinal silhouette is within normal limits. Right-sided central venous catheter tip projects over the SVC. No acute fractures are seen. IMPRESSION: Opacification in the inferior right hemithorax may be related to combination of pleural fluid, atelectasis/airspace disease or residual hernia. No pneumothorax or mediastinal shift. Electronically Signed   By: Ronney Asters M.D.   On: 03/29/2022 00:00   [place] EKG SITE RITE  Result Date: 03/27/2022 If Site Rite image not  attached, placement could not be confirmed due to current cardiac rhythm.  CT ABDOMEN PELVIS W CONTRAST  Result Date: 03/24/2022 CLINICAL DATA:  72 year old female with history of acute onset of nonlocalized abdominal pain. Vomiting. EXAM: CT ABDOMEN AND PELVIS WITH CONTRAST TECHNIQUE: Multidetector CT imaging of the abdomen and pelvis was performed using the standard protocol following bolus administration of intravenous contrast. RADIATION DOSE REDUCTION: This exam was performed according to the departmental dose-optimization program which includes automated exposure control, adjustment of the mA and/or kV according to patient size and/or use of iterative reconstruction technique. CONTRAST:  34mL OMNIPAQUE IOHEXOL 350 MG/ML SOLN COMPARISON:  CT of the abdomen and pelvis 06/16/2021. FINDINGS: Lower chest: Large hiatal hernia partially imaged, with greater than 50% of the stomach intrathoracic in position. Hepatobiliary: 1.4 cm low-attenuation lesion in segment 8 of the liver, compatible with a simple cyst (no imaging follow-up recommended). No other suspicious appearing hepatic lesions. No intra or extrahepatic biliary ductal dilatation. Gallbladder is unremarkable in appearance Pancreas: No pancreatic mass. No pancreatic ductal dilatation. No pancreatic or peripancreatic fluid collections or inflammatory changes. Spleen: Unremarkable. Adrenals/Urinary Tract: Bilateral kidneys and adrenal glands are normal in appearance. No hydroureteronephrosis. Urinary bladder is nearly completely decompressed, but otherwise unremarkable in appearance. Stomach/Bowel: Large hiatal hernia with greater than 50% of the stomach in the intrathoracic position. Stomach appears rotated along its long axis (i.e., this appears to represent an organo-axial volvulus), similar to prior studies. Intrathoracic and intra-abdominal portions of the stomach are both distended, and there is a large air-fluid level in the intrathoracic portion of  the stomach. Compression and narrowing of the antral pre-pyloric region of the stomach in the proximal duodenum is noted, which may suggest gastric outlet obstruction. No pathologic dilatation of other portions of small bowel or colon. A few scattered colonic diverticula are noted, without surrounding inflammatory changes to indicate an acute diverticulitis at this time. Normal appendix. Vascular/Lymphatic: No significant atherosclerotic disease, aneurysm or dissection noted in the abdominal or pelvic vasculature. Circumaortic left renal vein (normal anatomical variant) incidentally noted. No lymphadenopathy noted in the abdomen or pelvis. Reproductive: Retroverted uterus which is heterogeneous in appearance with multiple lesions, some of which are calcified, largest of which is located in the posterior aspect of the body of the uterus, encroaching upon the endometrial canal measuring up to 2.2 cm in diameter (image sagittal image 66 of series 8), most compatible with fibroids. Ovaries are unremarkable in appearance. Other: No significant volume of ascites.  No pneumoperitoneum. Musculoskeletal: There are no aggressive appearing lytic or blastic lesions noted in the visualized portions of the skeleton. IMPRESSION: 1. 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 Intrathoracic and intra-abdominal portions of the stomach are distended with a large air-fluid level, and the pyloric region and proximal duodenum appear compressed secondary to this altered anatomy, which may suggest associated gastric outlet obstruction. Surgical consultation is recommended. 2. No other acute findings are noted in the abdomen or pelvis on today's examination. 3.  Fibroid uterus. Electronically Signed   By: Vinnie Langton M.D.   On: 03/24/2022 06:17    Labs:  CBC: Recent Labs    04/04/22 0230 04/05/22 0309 04/06/22 0453 04/07/22 1238  WBC 14.7* 14.6* 16.5* 14.9*  HGB 10.6* 10.7* 10.6*  10.6*  HCT 31.8* 31.9* 32.3* 32.6*  PLT 273 300 353 409*    COAGS: Recent Labs    04/07/22 1625  INR 1.2    BMP: Recent Labs    04/01/22 0242 04/02/22 0310 04/03/22 0330 04/04/22 0230  NA 139 134* 136 134*  K 3.6 4.0 4.2 3.9  CL 105 104 103 103  CO2 25 25 25 25   GLUCOSE 164* 207* 125* 140*  BUN 11 11 9 10   CALCIUM 8.1* 8.1* 8.2* 8.2*  CREATININE 0.54 0.55 0.57 0.63  GFRNONAA >60 >60 >60 >60    LIVER FUNCTION TESTS: Recent Labs    03/24/22 0456 03/28/22 0413 03/31/22 0341 04/03/22 0330  BILITOT 0.9 1.1 1.0 0.9  AST 73* 40 15 19  ALT 67* 45* 21 19  ALKPHOS 85 38 47 73  PROT 6.9 4.7* 4.7* 5.0*  ALBUMIN 3.8 2.6* 1.8* 1.8*    TUMOR MARKERS: No results for input(s): "AFPTM", "CEA", "CA199", "CHROMGRNA" in the last 8760 hours.  Assessment and Plan:  Right cjest collection Scheduled for drain placement per Dr Roxan Hockey request Planned for IR today Risks and benefits discussed with the patient including bleeding, infection, damage to adjacent structures, and sepsis.  All of the patient's questions were answered, patient is agreeable to proceed. Consent signed and in chart.  Thank you for this interesting consult.  I greatly enjoyed meeting Alyssa Peterson and look forward to participating in their care.  A copy of this report was sent to the requesting provider on this date.  Electronically Signed: Lavonia Drafts, PA-C 04/08/2022, 6:56 AM   I spent a total of 20 Minutes    in face to face in clinical consultation, greater than 50% of which was counseling/coordinating care for right chest drain placement

## 2022-04-08 NOTE — Progress Notes (Signed)
12 Days Post-Op   Subjective/Chief Complaint: NAEO. Tolerating PO. IR saw the pt and plans to place drain into right pleural space today.   Objective: Vital signs in last 24 hours: Temp:  [98.6 F (37 C)-98.9 F (37.2 C)] 98.6 F (37 C) (01/16 0403) Pulse Rate:  [78-108] 78 (01/16 0403) Resp:  [16-17] 17 (01/16 0403) BP: (105-119)/(70-80) 107/76 (01/16 0403) SpO2:  [98 %-100 %] 100 % (01/16 0403) Weight:  [50.7 kg] 50.7 kg (01/16 0500) Last BM Date : 04/03/22  Intake/Output from previous day: 01/15 0701 - 01/16 0700 In: -  Out: 15 [Drains:15] Intake/Output this shift: No intake/output data recorded.  Exam: Awake and alert No respiratory distress Abdomen soft, NT, drain serosang  Lab Results:  Recent Labs    04/06/22 0453 04/07/22 1238  WBC 16.5* 14.9*  HGB 10.6* 10.6*  HCT 32.3* 32.6*  PLT 353 409*   BMET No results for input(s): "NA", "K", "CL", "CO2", "GLUCOSE", "BUN", "CREATININE", "CALCIUM" in the last 72 hours.  PT/INR Recent Labs    04/07/22 1625  LABPROT 14.7  INR 1.2   ABG No results for input(s): "PHART", "HCO3" in the last 72 hours.  Invalid input(s): "PCO2", "PO2"  Studies/Results: No results found.  Anti-infectives: Anti-infectives (From admission, onward)    Start     Dose/Rate Route Frequency Ordered Stop   03/27/22 1600  ceFEPIme (MAXIPIME) 2 g in sodium chloride 0.9 % 100 mL IVPB        2 g 200 mL/hr over 30 Minutes Intravenous Every 12 hours 03/27/22 1501 04/01/22 0427   03/27/22 1600  metroNIDAZOLE (FLAGYL) IVPB 500 mg        500 mg 100 mL/hr over 60 Minutes Intravenous Every 12 hours 03/27/22 1501 04/01/22 0456   03/27/22 0600  ceFAZolin (ANCEF) IVPB 2g/100 mL premix        2 g 200 mL/hr over 30 Minutes Intravenous On call to O.R. 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 This has not been treated. Will review with ID but likely needs a course of anti-fungal tx - Reviewed pt case with Dr. Roxan Hockey and IR yesterday 1/15, IR place to place drain into right thoracic fluid collection today. Appreciate there care.   FEN: NPO for procedure, then resume soft diet - D/C PICC if can get peripheral access ID: [email or web address]/flagyl x 5d post-op complete VTE: SCD's, lovenox Foley: none, removed POD#1 Dispo: med-surg  HTN - on norvasc at home, PRN IV metoprolol  GERD - PPI   Jill Alexanders PA-C 04/08/2022

## 2022-04-09 ENCOUNTER — Ambulatory Visit: Payer: Self-pay | Admitting: Nurse Practitioner

## 2022-04-09 DIAGNOSIS — K651 Peritoneal abscess: Secondary | ICD-10-CM | POA: Diagnosis not present

## 2022-04-09 LAB — AEROBIC/ANAEROBIC CULTURE W GRAM STAIN (SURGICAL/DEEP WOUND)

## 2022-04-09 LAB — BASIC METABOLIC PANEL
Anion gap: 6 (ref 5–15)
BUN: 11 mg/dL (ref 8–23)
CO2: 25 mmol/L (ref 22–32)
Calcium: 8.5 mg/dL — ABNORMAL LOW (ref 8.9–10.3)
Chloride: 106 mmol/L (ref 98–111)
Creatinine, Ser: 0.66 mg/dL (ref 0.44–1.00)
GFR, Estimated: >60 mL/min (ref >60)
Glucose, Bld: 112 mg/dL — ABNORMAL HIGH (ref 70–99)
Potassium: 3.4 mmol/L — ABNORMAL LOW (ref 3.5–5.1)
Sodium: 137 mmol/L (ref 135–145)

## 2022-04-09 LAB — CBC
HCT: 33.8 % — ABNORMAL LOW (ref 36.0–46.0)
Hemoglobin: 11.2 g/dL — ABNORMAL LOW (ref 12.0–15.0)
MCH: 29.1 pg (ref 26.0–34.0)
MCHC: 33.1 g/dL (ref 30.0–36.0)
MCV: 87.8 fL (ref 80.0–100.0)
Platelets: 408 10*3/uL — ABNORMAL HIGH (ref 150–400)
RBC: 3.85 MIL/uL — ABNORMAL LOW (ref 3.87–5.11)
RDW: 14.5 % (ref 11.5–15.5)
WBC: 9.5 10*3/uL (ref 4.0–10.5)
nRBC: 0 % (ref 0.0–0.2)

## 2022-04-09 LAB — GLUCOSE, CAPILLARY
Glucose-Capillary: 100 mg/dL — ABNORMAL HIGH (ref 70–99)
Glucose-Capillary: 106 mg/dL — ABNORMAL HIGH (ref 70–99)
Glucose-Capillary: 201 mg/dL — ABNORMAL HIGH (ref 70–99)
Glucose-Capillary: 96 mg/dL (ref 70–99)

## 2022-04-09 MED ORDER — EPINEPHRINE 0.3 MG/0.3ML IJ SOAJ: 0.3000 mg | Freq: Once | INTRAMUSCULAR | Status: DC | PRN

## 2022-04-09 MED ORDER — AMOXICILLIN 500 MG PO CAPS
500.0000 mg | ORAL_CAPSULE | Freq: Once | ORAL | Status: AC
  Administered 2022-04-09: 500 mg via ORAL
  Filled 2022-04-09 (×2): qty 1

## 2022-04-09 MED ORDER — DIPHENHYDRAMINE HCL 50 MG/ML IJ SOLN: 25.0000 mg | Freq: Once | INTRAMUSCULAR | Status: DC | PRN

## 2022-04-09 NOTE — Progress Notes (Addendum)
[address]       [place] [address]             [phone]       13 Days Post-Op Procedure(s) (LRB): LAPAROSCOPIC LYSIS OF ADHESIONS (N/A) LAPAROSCOPIC REPAIR OF HIATAL HERNIA (N/A) INSERTION OF GASTROSTOMY TUBE (N/A)  Subjective: Patient in good spirits this am. She has no specific complaint  Objective: Vital signs in last 24 hours: Temp:  [97.5 F (36.4 C)-99.1 F (37.3 C)] 98.9 F (37.2 C) (01/17 0740) Pulse Rate:  [80-97] 80 (01/17 0740) Cardiac Rhythm: Normal sinus rhythm (01/16 1234) Resp:  [16-21] 16 (01/17 0740) BP: (101-131)/(67-85) 101/71 (01/17 0740) SpO2:  [96 %-100 %] 97 % (01/17 0740) Weight:  [51.9 kg] 51.9 kg (01/17 0500)     Intake/Output from previous day: 01/16 0701 - 01/17 0700 In: 600 [P.O.:480; IV Piggyback:100] Out: 480 [Urine:400; Drains:20; Chest Tube:60]   Physical Exam:  Cardiovascular: RRR Pulmonary: Clear to auscultation bilaterally Abdomen: Soft, non tender, bowel sounds present. Extremities: No  lower extremity edema. Wound: Pelvic drain dressing is clean and dry. Right chest drain: to suction, dark bloody drainage  Lab Results: CBC: Recent Labs    04/07/22 1238  WBC 14.9*  HGB 10.6*  HCT 32.6*  PLT 409*    BMET: No results for input(s): "NA", "K", "CL", "CO2", "GLUCOSE", "BUN", "CREATININE", "CALCIUM" in the last 72 hours.  PT/INR:  Recent Labs    04/07/22 1625  LABPROT 14.7  INR 1.2    ABG:  INR: Will add last result for INR, ABG once components are confirmed Will add last 4 CBG results once components are confirmed  Assessment/Plan:  1. CV - SR. On Amlodipine 10 mg at hs 2.  Pulmonary - On room air. 3. S/p 28 French drain by IR into hernia sac collection. Drain to Pleura Vac with suction. 60 cc of output recorded (dark bloody) last 12 hours. Pleura Vac mark at 120 this am. Right pleural fluid shows no organisms or growth < 24 hours. 4. Management per General Surgery  Donielle M  ZimmermanPA-C 04/09/2022,8:39 AM  Looks good Has drained about 110 ml since drain placed. Will check CXR in AM.  May consider sending home with drain in place to a [facility]  Remo Lipps C. Roxan Hockey, MD [facility] [phone]

## 2022-04-09 NOTE — Progress Notes (Signed)
[facility]  Date of Admission:  03/24/2022     Abx: 1/16-c ceftriaxone 1/16-c fluconazole 400 1/16-c flagyl  ASSESSMENT: 72 yo female hiatal hernia s/p repair 08/24/67, complicated by gastric nicking and intraabd/thoracic abscess  Ir drain placement 1/12 pelvic abscess -- cx (in setting of almost a week empiric abx cefepime/flagyl) grew candida Ir place thoracic abscess drain 1/16 and cx in progress  Abx restarted with ceftriaxone/flagyl/fluconazole  She has pcn allergy 40 years ago and doesn't appear anaphylaxis. Will do oral pcn challenge  PLAN: Pcn oral challenge Continue current abx F/u drain culture Anticipate several 4-6 weeks abx; oral abx potentially option if culture indicates and repeat ct showed improved size abscess Discuss with primary team   I spent 50 minute reviewing data/chart, and coordinating care and >50% direct face to face time providing counseling/discussing diagnostics/treatment plan with patient   Principal Problem:   Hiatal hernia Active Problems:   Protein-calorie malnutrition, severe   Intra-abdominal abscess (HCC)   Allergies  Allergen Reactions   Latex Itching    hands   Penicillins Hives and Rash    Scheduled Meds:  alteplase  2 mg Intracatheter Once   alteplase  2 mg Intracatheter Once   alteplase  2 mg Intracatheter Once   alteplase  2 mg Intracatheter Once   amLODipine  10 mg Oral QHS   Chlorhexidine Gluconate Cloth  6 each Topical Daily   docusate sodium  100 mg Oral BID   enoxaparin (LOVENOX) injection  40 mg Subcutaneous Q24H   feeding supplement (NEPRO CARB STEADY)  237 mL Oral BID BM   insulin aspart  0-15 Units Subcutaneous Q6H   latanoprost  1 drop Both Eyes QHS   lidocaine  10 mL Intradermal Once   metoCLOPramide (REGLAN) injection  10 mg Intravenous Q6H   metroNIDAZOLE  500 mg Oral Q12H   pantoprazole (PROTONIX) IV  40 mg Intravenous Q12H   sodium chloride flush  10 mL  Intrapleural Q8H   sodium chloride flush  10-40 mL Intracatheter Q12H   sodium chloride flush  5 mL Intracatheter Q8H   Continuous Infusions:  cefTRIAXone (ROCEPHIN)  IV 2 g (04/08/22 2021)   fluconazole (DIFLUCAN) IV 400 mg (04/08/22 1811)   PRN Meds:.acetaminophen **OR** acetaminophen, albuterol, diphenhydrAMINE **OR** diphenhydrAMINE, HYDROmorphone (DILAUDID) injection, influenza vaccine adjuvanted, metoprolol tartrate, ondansetron **OR** ondansetron (ZOFRAN) IV, oxyCODONE, pneumococcal 20-valent conjugate vaccine, polyethylene glycol, sodium chloride flush   SUBJECTIVE: No complaint No pain Ir placed thoracic abscess drain  Review of Systems: ROS All other ROS was negative, except mentioned above     OBJECTIVE: Vitals:   04/08/22 1952 04/09/22 0415 04/09/22 0500 04/09/22 0740  BP: 130/74 131/67  101/71  Pulse: 86 82  80  Resp: 18 18  16   Temp: 98.8 F (37.1 C) 98.2 F (36.8 C)  98.9 F (37.2 C)  TempSrc: Oral Oral  Oral  SpO2: 97% 97%  97%  Weight:   51.9 kg   Height:       Body mass index is 20.27 kg/m.  Physical Exam  General/constitutional: no distress, pleasant HEENT: Normocephalic, PER, Conj Clear, EOMI, Oropharynx clear Neck supple CV: rrr no mrg Lungs: clear to auscultation, normal respiratory effort -- chest tube serosanguinous fluid Abd: Soft, Nontender -- abd jp drain with serosanguinous fluid Ext: no edema Skin: No Rash Neuro: nonfocal MSK: no peripheral joint swelling/tenderness/warmth; back spines nontender   Central line presence: no  Lab Results Lab  Results  Component Value Date   WBC 9.5 04/09/2022   HGB 11.2 (L) 04/09/2022   HCT 33.8 (L) 04/09/2022   MCV 87.8 04/09/2022   PLT 408 (H) 04/09/2022    Lab Results  Component Value Date   CREATININE 0.66 04/09/2022   BUN 11 04/09/2022   NA 137 04/09/2022   K 3.4 (L) 04/09/2022   CL 106 04/09/2022   CO2 25 04/09/2022    Lab Results  Component Value Date   ALT 19 04/03/2022    AST 19 04/03/2022   ALKPHOS 73 04/03/2022   BILITOT 0.9 04/03/2022      Microbiology: Recent Results (from the past 240 hour(s))  Aerobic/Anaerobic Culture w Gram Stain (surgical/deep wound)     Status: None   Collection Time: 04/04/22  3:57 PM   Specimen: Abscess  Result Value Ref Range Status   Specimen Description ABSCESS PELVIS  Final   Special Requests NONE  Final   Gram Stain   Final    ABUNDANT WBC PRESENT, PREDOMINANTLY PMN NO ORGANISMS SEEN    Culture   Final    RARE CANDIDA ALBICANS NO ANAEROBES ISOLATED Performed at [facility], [place] [address] [place], [place] [address]    Report Status 04/09/2022 FINAL  Final  Aerobic/Anaerobic Culture w Gram Stain (surgical/deep wound)     Status: None (Preliminary result)   Collection Time: 04/08/22 12:37 PM   Specimen: Pleural Fluid  Result Value Ref Range Status   Specimen Description PLEURAL  Final   Special Requests  RIGHT  Final   Gram Stain   Final    RARE WBC PRESENT,BOTH PMN AND MONONUCLEAR NO ORGANISMS SEEN    Culture   Final    NO GROWTH < 24 HOURS Performed at [facility], [place] [address] [place], [place] [address]    Report Status PENDING  Incomplete     Serology:   Imaging: If present, new imagings (plain films, ct scans, and mri) have been personally visualized and interpreted; radiology reports have been reviewed. Decision making incorporated into the Impression / Recommendations.   Jabier Mutton, [facility] [place] [phone] pager    04/09/2022, 1:42 PM

## 2022-04-09 NOTE — Progress Notes (Signed)
Miscellaneous Pharmacy Note  ASSESSMENT: 59 YOF with multiple intrabdominal abscess. She has a penicillin allergy listed in her chart. She reports this allergy occurred ~40 years ago after the birth of her second daughter and manifested as a maculopapular rash that did not involve the mucosa or cause desquamation. She is unaware if the medication was administered as IV or PO. She stated she received a dose of IV diphenhydramine and the reaction resolved. She did not require an Epi-Pen, and she did not have issues breathing. After discussion of the risks and benefits she has agreed to proceed with an amoxicillin oral challenge.    History of allergy:  Low Risk- Calculated PEN-FAST Score=1   Type of intervention (select all that apply):  Amoxicillin Oral Challenge  Impact on therapy (select all that apply): Potential for Penicillin allergy removed   PLAN: - amoxicillin 500 mg PO x1  - Q15 mins vitals monitoring x 1 hour  - IV diphenhydramine PRN - Epi-Pen PRN  - Plan communicated to Primary RN   ADDENDUM: Patient reports she had no reaction to the amoxicillin challenge and did not receive diphenhydramine before or after the dose.   - Will de-label penicillin allergy and notify her PCP and Pharmacy.   Adria Dill, PharmD PGY-2 Infectious Diseases Resident  04/10/2022 1:30 PM

## 2022-04-09 NOTE — Progress Notes (Signed)
Mobility Specialist - Progress Note   04/09/22 1021  Mobility  Activity Ambulated with assistance in hallway  Level of Assistance Standby assist, set-up cues, supervision of patient - no hands on  Assistive Device Front wheel walker  Distance Ambulated (ft) 110 ft  Activity Response Tolerated well  Mobility Referral Yes  $Mobility charge 1 Mobility    Pt received in bed agreeable to mobility. No complaints throughout, tolerated hallway distance well. Left in bed w/ call bell in reach.  [place] Specialist Please contact via SecureChat or Rehab office at [phone]

## 2022-04-09 NOTE — Progress Notes (Signed)
13 Days Post-Op   Subjective/Chief Complaint: NAEO. Tolerating PO. IR saw the pt and plans to place drain into right pleural space today.   Objective: Vital signs in last 24 hours: Temp:  [97.5 F (36.4 C)-99.1 F (37.3 C)] 98.9 F (37.2 C) (01/17 0740) Pulse Rate:  [80-97] 80 (01/17 0740) Resp:  [16-21] 16 (01/17 0740) BP: (101-131)/(67-85) 101/71 (01/17 0740) SpO2:  [96 %-100 %] 97 % (01/17 0740) Weight:  [51.9 kg] 51.9 kg (01/17 0500) Last BM Date : 04/03/22  Intake/Output from previous day: 01/16 0701 - 01/17 0700 In: 600 [P.O.:480; IV Piggyback:100] Out: 480 [Urine:400; Drains:20; Chest Tube:60] Intake/Output this shift: No intake/output data recorded.  Exam: Awake and alert No respiratory distress Abdomen soft, NT, drain serosang  Lab Results:  Recent Labs    04/07/22 1238  WBC 14.9*  HGB 10.6*  HCT 32.6*  PLT 409*   BMET No results for input(s): "NA", "K", "CL", "CO2", "GLUCOSE", "BUN", "CREATININE", "CALCIUM" in the last 72 hours.  PT/INR Recent Labs    04/07/22 1625  LABPROT 14.7  INR 1.2   ABG No results for input(s): "PHART", "HCO3" in the last 72 hours.  Invalid input(s): "PCO2", "PO2"  Studies/Results: CT GUIDED VISCERAL FLUID DRAIN BY PERC CATH  Result Date: 04/08/2022 CLINICAL DATA:  Loculated subpulmonic pleural fluid collection at the level of a prior large hiatal hernia sac after prior surgical repair. EXAM: CT GUIDED CATHETER DRAINAGE OF LOCULATED PLEURAL FLUID COLLECTION ANESTHESIA/SEDATION: Moderate (conscious) sedation was employed during this procedure. A total of Versed 1.0 mg and Fentanyl 50 mcg was administered intravenously by radiology nursing. Moderate Sedation Time: 41 minutes. The patient's level of consciousness and vital signs were monitored continuously by radiology nursing throughout the procedure under my direct supervision. PROCEDURE: The procedure, risks, benefits, and alternatives were explained to the patient. Questions  regarding the procedure were encouraged and answered. The patient understands and consents to the procedure. A time out was performed prior to initiating the procedure. CT was performed in multiple body positions through the chest. Ultimately, the procedure was performed in the right lateral decubitus position with the right side down. The right posterior chest wall was prepped with chlorhexidine in a sterile fashion, and a sterile drape was applied covering the operative field. A sterile gown and sterile gloves were used for the procedure. Local anesthesia was provided with 1% Lidocaine. An 18 gauge trocar needle was advanced through the posterior chest wall to the level of a right loculated pleural fluid collection. A small amount of fluid was aspirated after confirming needle tip position. A guidewire was advanced through the needle and the needle removed. The percutaneous tract was dilated over the wire and a 12 French pigtail drainage catheter advanced over the wire. The pigtail portion of the catheter was formed and the wire removed. Final catheter position was confirmed by CT. A fluid sample was withdrawn and sent for culture analysis. The catheter was then connected to a [employer] Pleur-evac device and secured at the skin with a Prolene retention suture and adhesive StatLock device. RADIATION DOSE REDUCTION: This exam was performed according to the departmental dose-optimization program which includes automated exposure control, adjustment of the mA and/or kV according to patient size and/or use of iterative reconstruction technique. COMPLICATIONS: None FINDINGS: Aspiration at the level of the loculated subpulmonic fluid collection yielded dark bloody fluid. There is good return of fluid after catheter placement. The Pleur-evac system will be connected to wall suction at -20 cm of water.  IMPRESSION: Placement percutaneous pigtail drainage catheter into the loculated subpulmonic right pleural fluid collection  yielding dark bloody fluid. A 12 French catheter was placed and connected to a Pleur-evac device which will be connected to wall suction. A fluid sample was sent for culture analysis. Electronically Signed   By: Aletta Edouard M.D.   On: 04/08/2022 13:32    Anti-infectives: Anti-infectives (From admission, onward)    Start     Dose/Rate Route Frequency Ordered Stop   04/08/22 1800  cefTRIAXone (ROCEPHIN) 2 g in sodium chloride 0.9 % 100 mL IVPB        2 g 200 mL/hr over 30 Minutes Intravenous Every 24 hours 04/08/22 1441     04/08/22 1800  metroNIDAZOLE (FLAGYL) tablet 500 mg        500 mg Oral Every 12 hours 04/08/22 1441     04/08/22 1600  fluconazole (DIFLUCAN) IVPB 400 mg        400 mg 100 mL/hr over 120 Minutes Intravenous Every 24 hours 04/08/22 1441     03/27/22 1600  ceFEPIme (MAXIPIME) 2 g in sodium chloride 0.9 % 100 mL IVPB        2 g 200 mL/hr over 30 Minutes Intravenous Every 12 hours 03/27/22 1501 04/01/22 0427   03/27/22 1600  metroNIDAZOLE (FLAGYL) IVPB 500 mg        500 mg 100 mL/hr over 60 Minutes Intravenous Every 12 hours 03/27/22 1501 04/01/22 0456   03/27/22 0600  ceFAZolin (ANCEF) IVPB 2g/100 mL premix        2 g 200 mL/hr over 30 Minutes Intravenous On call to O.R. 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 Drain currently to wall suction. Will discuss long term drain care with IR team so we can plan for discharge - mobilize and IS  FEN: SOFT, SLIV; PICC removed 1/16.  ID: cefepime/flagyl x 5d post-op complete. Drain cx 1/12 w/ candida albicans, drain cx from 1/17 pending (NGTD); appreciate ID reccomendations for abx. Rocephin/flagyl/diflucan started  1/16.  VTE: SCD's, lovenox Foley: none, removed POD#1 Dispo: med-surg, monitor drains, discharge planning. Still requires inpatient for IV antibiotics and R chest drain to suction.  HTN - on norvasc at home, PRN IV metoprolol  GERD - PPI   Jill Alexanders PA-C 04/09/2022

## 2022-04-09 NOTE — Progress Notes (Signed)
Referring Physician(s): Dr. Ninfa Linden  Supervising Physician: Ruthann Cancer  Patient Status:  [facility] - In-pt  Chief Complaint: Right chest tube Right TG drain   Subjective: Patient resting comfortably in bed.   Right chest tube in place with bloody output.  Right TG drain in place with cloudy appearing output.  No complaints related to drains.   Allergies: Latex and Penicillins  Medications: Prior to Admission medications   Medication Sig Start Date End Date Taking? Authorizing Provider  amLODipine (NORVASC) 10 MG tablet TAKE 1 TABLET(10 MG) BY MOUTH DAILY Patient taking differently: Take 10 mg by mouth at bedtime. TAKE 1 TABLET(10 MG) BY MOUTH DAILY 01/07/22  Yes Gildardo Pounds, NP  calcium carbonate (TUMS - DOSED IN MG ELEMENTAL CALCIUM) 500 MG chewable tablet Chew 1 tablet by mouth daily.   Yes [provider]  LUMIGAN 0.01 % SOLN Place 1 drop into both eyes at bedtime. 01/28/22  Yes [provider]     Vital Signs: BP 101/71 (BP Location: Left Arm)   Pulse 80   Temp 98.9 F (37.2 C) (Oral)   Resp 16   Ht 5\' 3"  (1.6 m)   Wt 114 lb 6.7 oz (51.9 kg)   SpO2 97%   BMI 20.27 kg/m   Physical Exam NAD, alert Chest: R chest tube in place. 140 mL bloody output in Pleurvac. No air leak.  Abdomen: R TG drain in place.  Insertion site intact.  Flushes and aspirates easily.  Small amount of cloudy fluid in bulb, aspiration of drain yields fluid and debris.   Imaging: CT GUIDED VISCERAL FLUID DRAIN BY PERC CATH  Result Date: 04/08/2022 CLINICAL DATA:  Loculated subpulmonic pleural fluid collection at the level of a prior large hiatal hernia sac after prior surgical repair. EXAM: CT GUIDED CATHETER DRAINAGE OF LOCULATED PLEURAL FLUID COLLECTION ANESTHESIA/SEDATION: Moderate (conscious) sedation was employed during this procedure. A total of Versed 1.0 mg and Fentanyl 50 mcg was administered intravenously by radiology nursing. Moderate Sedation Time: 41  minutes. The patient's level of consciousness and vital signs were monitored continuously by radiology nursing throughout the procedure under my direct supervision. PROCEDURE: The procedure, risks, benefits, and alternatives were explained to the patient. Questions regarding the procedure were encouraged and answered. The patient understands and consents to the procedure. A time out was performed prior to initiating the procedure. CT was performed in multiple body positions through the chest. Ultimately, the procedure was performed in the right lateral decubitus position with the right side down. The right posterior chest wall was prepped with chlorhexidine in a sterile fashion, and a sterile drape was applied covering the operative field. A sterile gown and sterile gloves were used for the procedure. Local anesthesia was provided with 1% Lidocaine. An 18 gauge trocar needle was advanced through the posterior chest wall to the level of a right loculated pleural fluid collection. A small amount of fluid was aspirated after confirming needle tip position. A guidewire was advanced through the needle and the needle removed. The percutaneous tract was dilated over the wire and a 12 French pigtail drainage catheter advanced over the wire. The pigtail portion of the catheter was formed and the wire removed. Final catheter position was confirmed by CT. A fluid sample was withdrawn and sent for culture analysis. The catheter was then connected to a [place] Pleur-evac device and secured at the skin with a Prolene retention suture and adhesive StatLock device. RADIATION DOSE REDUCTION: This exam was performed according  to the departmental dose-optimization program which includes automated exposure control, adjustment of the mA and/or kV according to patient size and/or use of iterative reconstruction technique. COMPLICATIONS: None FINDINGS: Aspiration at the level of the loculated subpulmonic fluid collection yielded dark  bloody fluid. There is good return of fluid after catheter placement. The Pleur-evac system will be connected to wall suction at -20 cm of water. IMPRESSION: Placement percutaneous pigtail drainage catheter into the loculated subpulmonic right pleural fluid collection yielding dark bloody fluid. A 12 French catheter was placed and connected to a Pleur-evac device which will be connected to wall suction. A fluid sample was sent for culture analysis. Electronically Signed   By: Aletta Edouard M.D.   On: 04/08/2022 13:32    Labs:  CBC: Recent Labs    04/05/22 0309 04/06/22 0453 04/07/22 1238 04/09/22 0904  WBC 14.6* 16.5* 14.9* 9.5  HGB 10.7* 10.6* 10.6* 11.2*  HCT 31.9* 32.3* 32.6* 33.8*  PLT 300 353 409* 408*    COAGS: Recent Labs    04/07/22 1625  INR 1.2    BMP: Recent Labs    04/02/22 0310 04/03/22 0330 04/04/22 0230 04/09/22 0904  NA 134* 136 134* 137  K 4.0 4.2 3.9 3.4*  CL 104 103 103 106  CO2 25 25 25 25   GLUCOSE 207* 125* 140* 112*  BUN 11 9 10 11   CALCIUM 8.1* 8.2* 8.2* 8.5*  CREATININE 0.55 0.57 0.63 0.66  GFRNONAA >60 >60 >60 >60    LIVER FUNCTION TESTS: Recent Labs    03/24/22 0456 03/28/22 0413 03/31/22 0341 04/03/22 0330  BILITOT 0.9 1.1 1.0 0.9  AST 73* 40 15 19  ALT 67* 45* 21 19  ALKPHOS 85 38 47 73  PROT 6.9 4.7* 4.7* 5.0*  ALBUMIN 3.8 2.6* 1.8* 1.8*    Assessment and Plan: S/p operative repair of a 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 Annamaria Boots.  S/p R chest collection drain placement 04/08/22 by Dr. Kathlene Cote  Chest tube in place, to 46mmHg. No air leak.  140 ml bloody output.  Patient reports she feels better since placement.  Culture NGTD.  WBC improved from 14 to 9.5 overnight.  TG drain in place with 20 mL output recorded yesterday.  Flushes and aspirates easily with return of cloudy fluid and debris.  Suction replace to bulb by squeezing the middle--  do not recommend inverting the bottom of the bulb as this is not effective.  Culture with rare candida, otherwise NG.   Continue current management.  Chest tube per surgery team.  IR following.   Electronically Signed: Docia Barrier, PA 04/09/2022, 3:13 PM   I spent a total of 15 Minutes at the the patient's bedside AND on the patient's hospital floor or unit, greater than 50% of which was counseling/coordinating care for incarcerated paraesophageal hernia with gastric volvulus

## 2022-04-09 NOTE — Plan of Care (Signed)

## 2022-04-10 ENCOUNTER — Other Ambulatory Visit: Payer: Self-pay | Admitting: Thoracic Surgery (Cardiothoracic Vascular Surgery)

## 2022-04-10 ENCOUNTER — Inpatient Hospital Stay (HOSPITAL_COMMUNITY): Payer: Medicare Other

## 2022-04-10 DIAGNOSIS — K651 Peritoneal abscess: Secondary | ICD-10-CM | POA: Diagnosis not present

## 2022-04-10 DIAGNOSIS — K449 Diaphragmatic hernia without obstruction or gangrene: Secondary | ICD-10-CM

## 2022-04-10 LAB — GLUCOSE, CAPILLARY
Glucose-Capillary: 111 mg/dL — ABNORMAL HIGH (ref 70–99)
Glucose-Capillary: 119 mg/dL — ABNORMAL HIGH (ref 70–99)
Glucose-Capillary: 121 mg/dL — ABNORMAL HIGH (ref 70–99)
Glucose-Capillary: 95 mg/dL (ref 70–99)
Glucose-Capillary: 96 mg/dL (ref 70–99)

## 2022-04-10 MED ORDER — POTASSIUM CHLORIDE 20 MEQ PO PACK
40.0000 meq | PACK | Freq: Once | ORAL | Status: AC
  Administered 2022-04-10: 40 meq via ORAL
  Filled 2022-04-10: qty 2

## 2022-04-10 MED ORDER — FLUCONAZOLE 100 MG PO TABS
400.0000 mg | ORAL_TABLET | Freq: Every day | ORAL | Status: DC
  Administered 2022-04-10 – 2022-04-11 (×2): 400 mg via ORAL
  Filled 2022-04-10 (×2): qty 4

## 2022-04-10 MED ORDER — AMOXICILLIN-POT CLAVULANATE 875-125 MG PO TABS
1.0000 | ORAL_TABLET | Freq: Two times a day (BID) | ORAL | Status: DC
  Administered 2022-04-10 – 2022-04-11 (×2): 1 via ORAL
  Filled 2022-04-10 (×2): qty 1

## 2022-04-10 NOTE — Progress Notes (Signed)
[facility] for Infectious Disease  Date of Admission:  03/24/2022     Total days of antibiotics 3         ASSESSMENT:  Ms. Killmer's culture from 04/08/22 have been without growth since drain placement. Overall feeling better and appears source control has been achieved with drains and chest tube. She passed her amoxicillin challenge and has been de-labeled from penicillin allergy. Change antibiotics to Augmentin and will continue antibiotics for [redacted] weeks along with fluconazole and arrange follow up in ID office. Recommend CT scan prior to discharge to ensure continued improvements. Please re-consult if worsening. Remaining medical and supportive care per General Surgery.   PLAN:  Change antibiotic to Augmentin and continue current dose of fluconazole. Drain and chest tube management per IR / General Surgery. Will monitor cultures for any new organisms that may present.  Follow up in ID office in 2-3 weeks. Recommend CT scan prior to discharge to ensure continued source control Remaining medical and supportive care per General Surgery.   ID will sign off. Please re-consult if needed.   Principal Problem:   Hiatal hernia Active Problems:   Protein-calorie malnutrition, severe   Intra-abdominal abscess (HCC)    alteplase  2 mg Intracatheter Once   alteplase  2 mg Intracatheter Once   alteplase  2 mg Intracatheter Once   alteplase  2 mg Intracatheter Once   amLODipine  10 mg Oral QHS   amoxicillin-clavulanate  1 tablet Oral Q12H   Chlorhexidine Gluconate Cloth  6 each Topical Daily   docusate sodium  100 mg Oral BID   enoxaparin (LOVENOX) injection  40 mg Subcutaneous Q24H   feeding supplement (NEPRO CARB STEADY)  237 mL Oral BID BM   fluconazole  400 mg Oral Daily   insulin aspart  0-15 Units Subcutaneous Q6H   latanoprost  1 drop Both Eyes QHS   lidocaine  10 mL Intradermal Once   metoCLOPramide (REGLAN) injection  10 mg Intravenous Q6H   pantoprazole (PROTONIX) IV   40 mg Intravenous Q12H   sodium chloride flush  10 mL Intrapleural Q8H   sodium chloride flush  10-40 mL Intracatheter Q12H   sodium chloride flush  5 mL Intracatheter Q8H    SUBJECTIVE:  Afebrile overnight with no acute events. No new concerns/complaints. Tolerating antibiotics.  Allergies  Allergen Reactions   Latex Itching    hands     Review of Systems: Review of Systems  Constitutional:  Negative for chills, fever and weight loss.  Respiratory:  Negative for cough, shortness of breath and wheezing.   Cardiovascular:  Negative for chest pain and leg swelling.  Gastrointestinal:  Negative for abdominal pain, constipation, diarrhea, nausea and vomiting.  Skin:  Negative for rash.      OBJECTIVE: Vitals:   04/09/22 2050 04/10/22 0454 04/10/22 0500 04/10/22 0959  BP: 108/76 109/79  108/86  Pulse: 78 85  79  Resp:    17  Temp: 97.7 F (36.5 C) 97.9 F (36.6 C)  98.5 F (36.9 C)  TempSrc: Oral Oral  Oral  SpO2: 99% 99%  98%  Weight:   52.3 kg   Height:       Body mass index is 20.42 kg/m.  Physical Exam Constitutional:      General: She is not in acute distress.    Appearance: She is well-developed.  Cardiovascular:     Rate and Rhythm: Normal rate and regular rhythm.     Heart sounds: Normal heart sounds.  Pulmonary:     Effort: Pulmonary effort is normal.     Breath sounds: Normal breath sounds.  Skin:    General: Skin is warm and dry.  Neurological:     Mental Status: She is alert and oriented to person, place, and time.  Psychiatric:        Behavior: Behavior normal.        Thought Content: Thought content normal.        Judgment: Judgment normal.     Lab Results Lab Results  Component Value Date   WBC 9.5 04/09/2022   HGB 11.2 (L) 04/09/2022   HCT 33.8 (L) 04/09/2022   MCV 87.8 04/09/2022   PLT 408 (H) 04/09/2022    Lab Results  Component Value Date   CREATININE 0.66 04/09/2022   BUN 11 04/09/2022   NA 137 04/09/2022   K 3.4 (L)  04/09/2022   CL 106 04/09/2022   CO2 25 04/09/2022    Lab Results  Component Value Date   ALT 19 04/03/2022   AST 19 04/03/2022   ALKPHOS 73 04/03/2022   BILITOT 0.9 04/03/2022     Microbiology: Recent Results (from the past 240 hour(s))  Aerobic/Anaerobic Culture w Gram Stain (surgical/deep wound)     Status: None   Collection Time: 04/04/22  3:57 PM   Specimen: Abscess  Result Value Ref Range Status   Specimen Description ABSCESS PELVIS  Final   Special Requests NONE  Final   Gram Stain   Final    ABUNDANT WBC PRESENT, PREDOMINANTLY PMN NO ORGANISMS SEEN    Culture   Final    RARE CANDIDA ALBICANS NO ANAEROBES ISOLATED Performed at [facility], [place] [address], [place], [place] [address]    Report Status 04/09/2022 FINAL  Final  Aerobic/Anaerobic Culture w Gram Stain (surgical/deep wound)     Status: None (Preliminary result)   Collection Time: 04/08/22 12:37 PM   Specimen: Pleural Fluid  Result Value Ref Range Status   Specimen Description PLEURAL  Final   Special Requests  RIGHT  Final   Gram Stain   Final    RARE WBC PRESENT,BOTH PMN AND MONONUCLEAR NO ORGANISMS SEEN    Culture   Final    NO GROWTH 2 DAYS NO ANAEROBES ISOLATED; CULTURE IN PROGRESS FOR 5 DAYS Performed at [facility], [place] [address] [place], [place] [address]    Report Status PENDING  Incomplete     Terri Piedra, [place] for Infectious Disease [facility]  04/10/2022  2:19 PM

## 2022-04-10 NOTE — Plan of Care (Signed)

## 2022-04-10 NOTE — Progress Notes (Addendum)
[address]       [place] [address]             [phone]       14 Days Post-Op Procedure(s) (LRB): LAPAROSCOPIC LYSIS OF ADHESIONS (N/A) LAPAROSCOPIC REPAIR OF HIATAL HERNIA (N/A) INSERTION OF GASTROSTOMY TUBE (N/A)  Subjective: Patient feels fairly well this am  Objective: Vital signs in last 24 hours: Temp:  [97.7 F (36.5 C)-98.5 F (36.9 C)] 97.9 F (36.6 C) (01/18 0454) Pulse Rate:  [78-94] 85 (01/18 0454) Resp:  [16-17] 16 (01/17 1831) BP: (101-109)/(68-79) 109/79 (01/18 0454) SpO2:  [95 %-99 %] 99 % (01/18 0454) Weight:  [52.3 kg] 52.3 kg (01/18 0500)     Intake/Output from previous day: 01/17 0701 - 01/18 0700 In: 480.1 [IV Piggyback:470.1] Out: 45 [Drains:20; Chest Tube:25]   Physical Exam:  Cardiovascular: RRR Pulmonary: Clear to auscultation bilaterally Abdomen: Soft, non tender, bowel sounds present. Extremities: No  lower extremity edema. Wound: Pelvic drain dressing is clean and dry. Right chest drain: to suction, dark bloody drainage  Lab Results: CBC: Recent Labs    04/07/22 1238 04/09/22 0904  WBC 14.9* 9.5  HGB 10.6* 11.2*  HCT 32.6* 33.8*  PLT 409* 408*    BMET:  Recent Labs    04/09/22 0904  NA 137  K 3.4*  CL 106  CO2 25  GLUCOSE 112*  BUN 11  CREATININE 0.66  CALCIUM 8.5*    PT/INR:  Recent Labs    04/07/22 1625  LABPROT 14.7  INR 1.2    ABG:  INR: Will add last result for INR, ABG once components are confirmed Will add last 4 CBG results once components are confirmed  Assessment/Plan:  1. CV - SR. On Amlodipine 10 mg at hs 2.  Pulmonary - On room air. 3. S/p 56 French drain by IR into hernia sac collection. Drain to Pleura Vac with suction. 25 cc of output recorded (dark bloody) last 24 hours. Pleura Vac mark at 140 this am. I placed drain to water seal. As discussed with Dr. Roxan Hockey, place to mini express. Right pleural fluid shows no organisms or growth < 24 hours. 4.  Management per General Surgery;will arrange CT and follow up with Dr. Roxan Hockey in 1-2 weeks  Donielle M [facility] 04/10/2022,7:54 AM  Patient seen and examined, agree with above Feels well and hopes to go home soon Not much output from CT. Would leave in place and may drain more as hematoma liquifies IS ok from our standpoint o go home with tube and we can see her back as an outpatient  Remo Lipps C. Roxan Hockey, MD [facility] [phone]

## 2022-04-10 NOTE — TOC Initial Note (Addendum)
Transition of Care (TOC) - Initial/Assessment Note   Spoke to patient at bedside. Patient lives with her ex husband who assists her. She also has sisters with can assist.   Patient on mini express. Explained that nurses at hospital will teach her and family mini express care and provide syringe for home. Spoke to Laurena Spies regarding same. Nurses will also teach patient and family pelvic drain care  Patient will have a [place] , however [facility] does  not visit daily .   Patient voiced understanding    Tommi Rumps with [facility] accepted referral for [facility]   Will need order and face to face for [facility]   Has PCP , insurance and can get medications  Patient Details  Name: Alyssa Peterson MRN: [id number] Date of Birth: 1950/12/20  Transition of Care [facility]) CM/SW Contact:    Marilu Favre, RN Phone Number: 04/10/2022, 10:49 AM  Clinical Narrative:                         Patient Goals and CMS Choice            Expected Discharge Plan and Services                                              Prior Living Arrangements/Services                       Activities of Daily Living Home Assistive Devices/Equipment: None ADL Screening (condition at time of admission) Patient's cognitive ability adequate to safely complete daily activities?: Yes Is the patient deaf or have difficulty hearing?: No Does the patient have difficulty seeing, even when wearing glasses/contacts?: No Does the patient have difficulty concentrating, remembering, or making decisions?: No Patient able to express need for assistance with ADLs?: Yes Does the patient have difficulty dressing or bathing?: No Independently performs ADLs?: Yes (appropriate for developmental age) Does the patient have difficulty walking or climbing stairs?: No Weakness of Legs: None Weakness of Arms/Hands: None  Permission Sought/Granted                  Emotional Assessment              Admission  diagnosis:  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 [address] Phone: [phone] Fax: [phone]     Social Determinants of Health (SDOH) Social History: SDOH Screenings   Food Insecurity: No Food Insecurity (03/24/2022)  Housing: Low Risk  (03/24/2022)  Transportation Needs: No Transportation Needs (03/24/2022)  Utilities: Not At Risk (03/24/2022)  Depression (PHQ2-9): Low Risk  (01/07/2022)  Tobacco Use: Low Risk  (03/28/2022)   SDOH Interventions:     Readmission Risk Interventions    07/09/2020    4:07 PM  Readmission Risk Prevention Plan  Post Dischage Appt Complete  Medication Screening Complete  Transportation Screening Complete

## 2022-04-10 NOTE — Progress Notes (Signed)
Mobility Specialist - Progress Note   04/10/22 1155  Mobility  Activity Ambulated with assistance in hallway  Level of Assistance Standby assist, set-up cues, supervision of patient - no hands on  Assistive Device Front wheel walker  Distance Ambulated (ft) 300 ft  Activity Response Tolerated well  Mobility Referral Yes  $Mobility charge 1 Mobility    Pt received in bed agreeable to mobility. No complaints throughout, stated she was feeling much better today. Tolerated increased distance well. Left in bed w/ call bell in reach and all needs met.   [place] Specialist Please contact via SecureChat or Rehab office at [phone]

## 2022-04-10 NOTE — Progress Notes (Signed)
14 Days Post-Op   Subjective/Chief Complaint: Overall feels well. Pain controlled. Tolerating PO. Had a BM last night. Walked in hall last night.  Objective: Vital signs in last 24 hours: Temp:  [97.7 F (36.5 C)-98.5 F (36.9 C)] 97.9 F (36.6 C) (01/18 0454) Pulse Rate:  [78-94] 85 (01/18 0454) Resp:  [16-17] 16 (01/17 1831) BP: (101-109)/(68-79) 109/79 (01/18 0454) SpO2:  [95 %-99 %] 99 % (01/18 0454) Weight:  [52.3 kg] 52.3 kg (01/18 0500) Last BM Date : 04/03/22  Intake/Output from previous day: 01/17 0701 - 01/18 0700 In: 480.1 [IV Piggyback:470.1] Out: 45 [Drains:20; Chest Tube:25] Intake/Output this shift: No intake/output data recorded.  Exam: Awake and alert No respiratory distress  Chest tube - placed to WS this AM by TCTS, no air leak, 25 mL documented last 24h - dark thin bloody fluid Abdomen soft, NT, drain serosang  JP drain - 20 mL SS cloudy fluid   G-tube - clamped, sutures removed by me without complication Lab Results:  Recent Labs    04/07/22 1238 04/09/22 0904  WBC 14.9* 9.5  HGB 10.6* 11.2*  HCT 32.6* 33.8*  PLT 409* 408*   BMET Recent Labs    04/09/22 0904  NA 137  K 3.4*  CL 106  CO2 25  GLUCOSE 112*  BUN 11  CREATININE 0.66  CALCIUM 8.5*    PT/INR Recent Labs    04/07/22 1625  LABPROT 14.7  INR 1.2   ABG No results for input(s): "PHART", "HCO3" in the last 72 hours.  Invalid input(s): "PCO2", "PO2"  Studies/Results: CT GUIDED VISCERAL FLUID DRAIN BY PERC CATH  Result Date: 04/08/2022 CLINICAL DATA:  Loculated subpulmonic pleural fluid collection at the level of a prior large hiatal hernia sac after prior surgical repair. EXAM: CT GUIDED CATHETER DRAINAGE OF LOCULATED PLEURAL FLUID COLLECTION ANESTHESIA/SEDATION: Moderate (conscious) sedation was employed during this procedure. A total of Versed 1.0 mg and Fentanyl 50 mcg was administered intravenously by radiology nursing. Moderate Sedation Time: 41 minutes. The patient's  level of consciousness and vital signs were monitored continuously by radiology nursing throughout the procedure under my direct supervision. PROCEDURE: The procedure, risks, benefits, and alternatives were explained to the patient. Questions regarding the procedure were encouraged and answered. The patient understands and consents to the procedure. A time out was performed prior to initiating the procedure. CT was performed in multiple body positions through the chest. Ultimately, the procedure was performed in the right lateral decubitus position with the right side down. The right posterior chest wall was prepped with chlorhexidine in a sterile fashion, and a sterile drape was applied covering the operative field. A sterile gown and sterile gloves were used for the procedure. Local anesthesia was provided with 1% Lidocaine. An 18 gauge trocar needle was advanced through the posterior chest wall to the level of a right loculated pleural fluid collection. A small amount of fluid was aspirated after confirming needle tip position. A guidewire was advanced through the needle and the needle removed. The percutaneous tract was dilated over the wire and a 12 French pigtail drainage catheter advanced over the wire. The pigtail portion of the catheter was formed and the wire removed. Final catheter position was confirmed by CT. A fluid sample was withdrawn and sent for culture analysis. The catheter was then connected to a Sahara Pleur-evac device and secured at the skin with a Prolene retention suture and adhesive StatLock device. RADIATION DOSE REDUCTION: This exam was performed according to the departmental dose-optimization  program which includes automated exposure control, adjustment of the mA and/or kV according to patient size and/or use of iterative reconstruction technique. COMPLICATIONS: None FINDINGS: Aspiration at the level of the loculated subpulmonic fluid collection yielded dark bloody fluid. There is good  return of fluid after catheter placement. The Pleur-evac system will be connected to wall suction at -20 cm of water. IMPRESSION: Placement percutaneous pigtail drainage catheter into the loculated subpulmonic right pleural fluid collection yielding dark bloody fluid. A 12 French catheter was placed and connected to a Pleur-evac device which will be connected to wall suction. A fluid sample was sent for culture analysis. Electronically Signed   By: Aletta Edouard M.D.   On: 04/08/2022 13:32    Anti-infectives: Anti-infectives (From admission, onward)    Start     Dose/Rate Route Frequency Ordered Stop   04/09/22 1600  amoxicillin (AMOXIL) capsule 500 mg        500 mg Oral  Once 04/09/22 1506 04/09/22 1820   04/08/22 1800  cefTRIAXone (ROCEPHIN) 2 g in sodium chloride 0.9 % 100 mL IVPB        2 g 200 mL/hr over 30 Minutes Intravenous Every 24 hours 04/08/22 1441     04/08/22 1800  metroNIDAZOLE (FLAGYL) tablet 500 mg        500 mg Oral Every 12 hours 04/08/22 1441     04/08/22 1600  fluconazole (DIFLUCAN) IVPB 400 mg        400 mg 100 mL/hr over 120 Minutes Intravenous Every 24 hours 04/08/22 1441     03/27/22 1600  ceFEPIme (MAXIPIME) 2 g in sodium chloride 0.9 % 100 mL IVPB        2 g 200 mL/hr over 30 Minutes Intravenous Every 12 hours 03/27/22 1501 04/01/22 0427   03/27/22 1600  metroNIDAZOLE (FLAGYL) IVPB 500 mg        500 mg 100 mL/hr over 60 Minutes Intravenous Every 12 hours 03/27/22 1501 04/01/22 0456   03/27/22 0600  ceFAZolin (ANCEF) IVPB 2g/100 mL premix        2 g 200 mL/hr over 30 Minutes Intravenous On call to O.R. 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  IR was able to place a drain 1/16, follow cultures. Drain placed  to water seal and TCTS planning to attach to mini express. Trace R PTX on CXR today. - mobilize and IS - G tube sutures removed today   FEN: SOFT, SLIV; PICC removed 1/16.  ID: cefepime/flagyl x 5d post-op complete. Drain cx 1/12 w/ candida albicans, drain cx from 1/17 pending (NGTD); appreciate ID reccomendations for abx.Rocephin 1/16-1/18, Flagyl 1/126 >> Diflucan 1/16 >>  PO challenge of amoxicillin yesterday by ID.   VTE: SCD's, lovenox Foley: none, removed POD#1 Dispo: med-surg, monitor drains - need to observe chest tube drainage for at least another 24 hours prior to considering discharge. Repeat CXR in AM to make sure trace PTX stable. Will discuss possible repeat CT imaging prior to discharge as well with MD.   HTN - on norvasc at home, PRN IV metoprolol  GERD - PPI   Jill Alexanders PA-C 04/10/2022

## 2022-04-10 NOTE — Progress Notes (Signed)
Pt stated that her ex husband and one of her daughters could be here at 0900 tomorrow am for education on the JP drain and the chest tube drain. Laurena Spies agreed to be available for education on the Mini-Express for home and the 6N nurse will do the education on the JP drain for home.

## 2022-04-11 ENCOUNTER — Inpatient Hospital Stay (HOSPITAL_COMMUNITY): Payer: Medicare Other

## 2022-04-11 ENCOUNTER — Other Ambulatory Visit (HOSPITAL_COMMUNITY): Payer: Self-pay

## 2022-04-11 LAB — GLUCOSE, CAPILLARY
Glucose-Capillary: 101 mg/dL — ABNORMAL HIGH (ref 70–99)
Glucose-Capillary: 102 mg/dL — ABNORMAL HIGH (ref 70–99)
Glucose-Capillary: 118 mg/dL — ABNORMAL HIGH (ref 70–99)

## 2022-04-11 LAB — BASIC METABOLIC PANEL
Anion gap: 8 (ref 5–15)
BUN: 10 mg/dL (ref 8–23)
CO2: 25 mmol/L (ref 22–32)
Calcium: 8.6 mg/dL — ABNORMAL LOW (ref 8.9–10.3)
Chloride: 105 mmol/L (ref 98–111)
Creatinine, Ser: 0.86 mg/dL (ref 0.44–1.00)
GFR, Estimated: >60 mL/min (ref >60)
Glucose, Bld: 115 mg/dL — ABNORMAL HIGH (ref 70–99)
Potassium: 3.6 mmol/L (ref 3.5–5.1)
Sodium: 138 mmol/L (ref 135–145)

## 2022-04-11 MED ORDER — SALINE FLUSH 0.9 % IV SOLN
5.0000 mL | Freq: Two times a day (BID) | INTRAVENOUS | 0 refills | Status: DC
  Filled 2022-04-11: qty 300, 15d supply, fill #0

## 2022-04-11 MED ORDER — FLUCONAZOLE 200 MG PO TABS
400.0000 mg | ORAL_TABLET | Freq: Every day | ORAL | 0 refills | Status: AC
  Filled 2022-04-11: qty 78, 39d supply, fill #0

## 2022-04-11 MED ORDER — ACETAMINOPHEN 325 MG PO TABS: 650.0000 mg | ORAL_TABLET | Freq: Four times a day (QID) | ORAL | Status: DC | PRN

## 2022-04-11 MED ORDER — FLUCONAZOLE 200 MG PO TABS
400.0000 mg | ORAL_TABLET | Freq: Every day | ORAL | 0 refills | Status: DC
  Filled 2022-04-11: qty 42, 21d supply, fill #0

## 2022-04-11 MED ORDER — AMOXICILLIN-POT CLAVULANATE 875-125 MG PO TABS
1.0000 | ORAL_TABLET | Freq: Two times a day (BID) | ORAL | 0 refills | Status: AC
  Filled 2022-04-11 (×2): qty 78, 39d supply, fill #0

## 2022-04-11 MED ORDER — AMOXICILLIN-POT CLAVULANATE 875-125 MG PO TABS
1.0000 | ORAL_TABLET | Freq: Two times a day (BID) | ORAL | 0 refills | Status: DC
  Filled 2022-04-11: qty 42, 21d supply, fill #0

## 2022-04-11 MED ORDER — POLYETHYLENE GLYCOL 3350 17 GM/SCOOP PO POWD
17.0000 g | Freq: Every day | ORAL | 0 refills | Status: DC | PRN
  Filled 2022-04-11: qty 238, 14d supply, fill #0

## 2022-04-11 NOTE — Progress Notes (Signed)
[address]       [place] [address]             [phone]       15 Days Post-Op Procedure(s) (LRB): LAPAROSCOPIC LYSIS OF ADHESIONS (N/A) LAPAROSCOPIC REPAIR OF HIATAL HERNIA (N/A) INSERTION OF GASTROSTOMY TUBE (N/A)  Subjective: Patient discussing discharge plans with General Surgery/Trauma PA  Objective: Vital signs in last 24 hours: Temp:  [97.9 F (36.6 C)-98.8 F (37.1 C)] 98.8 F (37.1 C) (01/19 0551) Pulse Rate:  [75-90] 90 (01/19 0551) Resp:  [17-18] 17 (01/19 0551) BP: (104-121)/(73-86) 121/83 (01/19 0551) SpO2:  [97 %-98 %] 97 % (01/19 0551)     Intake/Output from previous day: 01/18 0701 - 01/19 0700 In: 250 [P.O.:240] Out: 50 [Drains:15; Chest Tube:35]   Physical Exam:  Cardiovascular: RRR Pulmonary: Clear to auscultation bilaterally Abdomen: Soft, non tender, bowel sounds present. Extremities: No  lower extremity edema. Wound: Pelvic drain dressing is clean and dry. Right chest drain: to mini express, dark bloody drainage  Lab Results: CBC: Recent Labs    04/09/22 0904  WBC 9.5  HGB 11.2*  HCT 33.8*  PLT 408*    BMET:  Recent Labs    04/09/22 0904 04/11/22 0340  NA 137 138  K 3.4* 3.6  CL 106 105  CO2 25 25  GLUCOSE 112* 115*  BUN 11 10  CREATININE 0.66 0.86  CALCIUM 8.5* 8.6*     PT/INR:  No results for input(s): "LABPROT", "INR" in the last 72 hours.  ABG:  INR: Will add last result for INR, ABG once components are confirmed Will add last 4 CBG results once components are confirmed  Assessment/Plan:  1. CV - SR. On Amlodipine 10 mg at hs 2.  Pulmonary - On room air. 3. S/p 63 French drain by IR into hernia sac collection. Drain to Pleura Vac with suction. 35 cc of output recorded (dark bloody) last 12 hours. Pleura Vac mark at 140 this am. CXR this am shows no pneumothorax, bilateral pleural effusions R>L. As discussed with Dr. Roxan Hockey, place to mini express. Right pleural fluid shows no  organisms or growth 2 days 4. Management per General Surgery;will arrange CT and follow up arranged with Dr. Roxan Hockey in 2 weeks  Haleema Vanderheyden M [facility] 04/11/2022,8:28 AM

## 2022-04-11 NOTE — Progress Notes (Signed)
Referring Physician(s): Dr. Magnus Ivan  Supervising Physician: Richarda Overlie  Patient Status:  Alyssa Peterson - In-pt  Chief Complaint: Right chest tube Right TG drain   Subjective: Lying on left side due to discomfort from right chest tube. Otherwise no complaints.   Going home today with both drains in place.   Allergies: Latex  Medications: Prior to Admission medications   Medication Sig Start Date End Date Taking? Authorizing Provider  amLODipine (NORVASC) 10 MG tablet TAKE 1 TABLET(10 MG) BY MOUTH DAILY Patient taking differently: Take 10 mg by mouth at bedtime. TAKE 1 TABLET(10 MG) BY MOUTH DAILY 01/07/22  Yes Claiborne Rigg, NP  calcium carbonate (TUMS - DOSED IN MG ELEMENTAL CALCIUM) 500 MG chewable tablet Chew 1 tablet by mouth daily.   Yes [provider]  LUMIGAN 0.01 % SOLN Place 1 drop into both eyes at bedtime. 01/28/22  Yes [provider]     Vital Signs: BP 114/89 (BP Location: Left Arm)   Pulse 69   Temp 98.1 F (36.7 C) (Oral)   Resp 16   Ht 5\' 3"  (1.6 m)   Wt 115 lb 4.8 oz (52.3 kg)   SpO2 98%   BMI 20.42 kg/m   Physical Exam NAD, alert Chest: Right chest tube in place with bloody output in miniexpress.  Abdomen: R TG drain in place.  No concerns with site.  Flushed and aspirated with return of turbid fluid with debris.   Imaging: DG CHEST PORT 1 VIEW  Result Date: 04/11/2022 CLINICAL DATA:  04/13/2022 Pneumothorax, right [id number] EXAM: PORTABLE CHEST 1 VIEW COMPARISON:  Chest radiograph from one day prior. FINDINGS: Stable pigtail medial basilar right chest drain. Stable cardiomediastinal silhouette with normal heart size. No pneumothorax. Small right greater than left pleural effusions are stable. Stable hazy right greater than left lung base opacities. No pulmonary edema. IMPRESSION: 1. No pneumothorax. Stable pigtail medial basilar right chest drain. 2. Stable small right greater than left pleural effusions. 3. Stable hazy right greater than  left lung base opacities, favor atelectasis. Electronically Signed   By: [id number] M.D.   On: 04/11/2022 08:16   DG Chest Port 1 View  Result Date: 04/10/2022 CLINICAL DATA:  Pleural effusion, chest tube EXAM: PORTABLE CHEST 1 VIEW COMPARISON:  Portable exam 0540 hours compared to 03/31/2022 FINDINGS: Pigtail RIGHT thoracostomy tube. Upper normal heart size. Mediastinal contours and pulmonary vascularity normal. Moderate RIGHT pleural effusion and basilar atelectasis. Smaller LEFT pleural effusion and basilar atelectasis. Upper lungs clear. Trace RIGHT pneumothorax. IMPRESSION: BILATERAL pleural effusions and basilar atelectasis, greater on RIGHT. Trace RIGHT pneumothorax in patient with RIGHT chest tube. Electronically Signed   By: 05/30/2022 M.D.   On: 04/10/2022 08:37   CT GUIDED VISCERAL FLUID DRAIN BY PERC CATH  Result Date: 04/08/2022 CLINICAL DATA:  Loculated subpulmonic pleural fluid collection at the level of a prior large hiatal hernia sac after prior surgical repair. EXAM: CT GUIDED CATHETER DRAINAGE OF LOCULATED PLEURAL FLUID COLLECTION ANESTHESIA/SEDATION: Moderate (conscious) sedation was employed during this procedure. A total of Versed 1.0 mg and Fentanyl 50 mcg was administered intravenously by radiology nursing. Moderate Sedation Time: 41 minutes. The patient's level of consciousness and vital signs were monitored continuously by radiology nursing throughout the procedure under my direct supervision. PROCEDURE: The procedure, risks, benefits, and alternatives were explained to the patient. Questions regarding the procedure were encouraged and answered. The patient understands and consents to the procedure. A time out was performed prior to initiating  the procedure. CT was performed in multiple body positions through the chest. Ultimately, the procedure was performed in the right lateral decubitus position with the right side down. The right posterior chest wall was prepped with  chlorhexidine in a sterile fashion, and a sterile drape was applied covering the operative field. A sterile gown and sterile gloves were used for the procedure. Local anesthesia was provided with 1% Lidocaine. An 18 gauge trocar needle was advanced through the posterior chest wall to the level of a right loculated pleural fluid collection. A small amount of fluid was aspirated after confirming needle tip position. A guidewire was advanced through the needle and the needle removed. The percutaneous tract was dilated over the wire and a 12 French pigtail drainage catheter advanced over the wire. The pigtail portion of the catheter was formed and the wire removed. Final catheter position was confirmed by CT. A fluid sample was withdrawn and sent for culture analysis. The catheter was then connected to a Sahara Pleur-evac device and secured at the skin with a Prolene retention suture and adhesive StatLock device. RADIATION DOSE REDUCTION: This exam was performed according to the departmental dose-optimization program which includes automated exposure control, adjustment of the mA and/or kV according to patient size and/or use of iterative reconstruction technique. COMPLICATIONS: None FINDINGS: Aspiration at the level of the loculated subpulmonic fluid collection yielded dark bloody fluid. There is good return of fluid after catheter placement. The Pleur-evac system will be connected to wall suction at -20 cm of water. IMPRESSION: Placement percutaneous pigtail drainage catheter into the loculated subpulmonic right pleural fluid collection yielding dark bloody fluid. A 12 French catheter was placed and connected to a Pleur-evac device which will be connected to wall suction. A fluid sample was sent for culture analysis. Electronically Signed   By: Aletta Edouard M.D.   On: 04/08/2022 13:32    Labs:  CBC: Recent Labs    04/05/22 0309 04/06/22 0453 04/07/22 1238 04/09/22 0904  WBC 14.6* 16.5* 14.9* 9.5  HGB  10.7* 10.6* 10.6* 11.2*  HCT 31.9* 32.3* 32.6* 33.8*  PLT 300 353 409* 408*     COAGS: Recent Labs    04/07/22 1625  INR 1.2     BMP: Recent Labs    04/03/22 0330 04/04/22 0230 04/09/22 0904 04/11/22 0340  NA 136 134* 137 138  K 4.2 3.9 3.4* 3.6  CL 103 103 106 105  CO2 25 25 25 25   GLUCOSE 125* 140* 112* 115*  BUN 9 10 11 10   CALCIUM 8.2* 8.2* 8.5* 8.6*  CREATININE 0.57 0.63 0.66 0.86  GFRNONAA >60 >60 >60 >60     LIVER FUNCTION TESTS: Recent Labs    03/24/22 0456 03/28/22 0413 03/31/22 0341 04/03/22 0330  BILITOT 0.9 1.1 1.0 0.9  AST 73* 40 15 19  ALT 67* 45* 21 19  ALKPHOS 85 38 47 73  PROT 6.9 4.7* 4.7* 5.0*  ALBUMIN 3.8 2.6* 1.8* 1.8*     Assessment and Plan: S/p operative repair of a 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 Annamaria Boots.  S/p R chest collection drain placement 04/08/22 by Dr. Kathlene Cote  Chest tube in place, to 4mmHg. No air leak.  140 ml bloody output. Plans per TCTS-- appears patient will discharge home to miniexpress.   TG drain in place with 15 mL output recorded yesterday.  Flushes and aspirates easily with return of blood-tinged turbid fluid and debris.  Suction replace to  bulb by squeezing the middle-- do not recommend inverting the bottom of the bulb as this is not effective.  Culture with rare candida, otherwise NG.  Followed by Infectious Disease.   Patient will discharge home with drain in place.  Flush drain daily with 5-10 mL sterile saline. Dressing changes Q3days or PRN if soiled/wet and record output once daily. Appreciate RN providing education on flushing/drain care. She will see [place] in clinic in 7-10 days for follow up CT/possible drain injection. I have placed drain care instructions and follow up contact information in her discharge summary today.    Electronically Signed: Docia Barrier, PA 04/11/2022, 10:41 AM   I spent a total  of 15 Minutes at the the patient's bedside AND on the patient's hospital floor or unit, greater than 50% of which was counseling/coordinating care for incarcerated paraesophageal hernia with gastric volvulus

## 2022-04-11 NOTE — Progress Notes (Signed)
Provided education on care of Express Mini 500 chest tube drainage system. Education handout also provided with QR code to video on this device and how to move around with drainage system (straps for wearing like a purse), how to remove drainage from system if needed. Crystal (patient's daughter from [place]) and Francee Piccolo (patient's ex-husband) present for education. Roger able to successfully return demonstrate removing drainage from Express Mini 500.

## 2022-04-11 NOTE — Progress Notes (Signed)
Pt discharged to home late in shift. DC instructions given with ex-husband at bedside. Education regarding management and flushing of drain given to patient and husband at bedside. Pt and  husband demonstrated understanding. Left unit in wheelchair pushed by NT. Left in stable condition.

## 2022-04-11 NOTE — TOC Progression Note (Signed)
Transition of Care (TOC) - Progression Note   Nurse has provided teaching and supplies.   Tommi Rumps with [facility] aware discharge is today  Patient Details  Name: Alyssa Peterson MRN: [id number] Date of Birth: 1950/05/20  Transition of Care [facility]) CM/SW Contact  Artha Chiasson, Edson Snowball, RN Phone Number: 04/11/2022, 2:14 PM  Clinical Narrative:            Expected Discharge Plan and Services         Expected Discharge Date: 04/11/22                                     Social Determinants of Health (SDOH) Interventions SDOH Screenings   Food Insecurity: No Food Insecurity (03/24/2022)  Housing: Low Risk  (03/24/2022)  Transportation Needs: No Transportation Needs (03/24/2022)  Utilities: Not At Risk (03/24/2022)  Depression (PHQ2-9): Low Risk  (01/07/2022)  Tobacco Use: Low Risk  (03/28/2022)    Readmission Risk Interventions    07/09/2020    4:07 PM  Readmission Risk Prevention Plan  Post Dischage Appt Complete  Medication Screening Complete  Transportation Screening Complete

## 2022-04-11 NOTE — Progress Notes (Signed)
Mobility Specialist - Progress Note   04/11/22 1422  Mobility  Activity Ambulated with assistance in hallway  Level of Assistance Standby assist, set-up cues, supervision of patient - no hands on  Assistive Device Front wheel walker  Distance Ambulated (ft) 200 ft  Activity Response Tolerated well  Mobility Referral Yes  $Mobility charge 1 Mobility    Pt received in bed agreeable to mobility. No complaints throughout. Left in bed w/ call bell within reach.  [place] Specialist Please contact via SecureChat or Rehab office at [phone]

## 2022-04-11 NOTE — Discharge Summary (Addendum)
[facility] Discharge Summary   Patient ID: Alyssa Peterson MRN: [id number] DOB/AGE: Jul 03, 1950 72 y.o.  Admit date: 03/24/2022 Discharge date: 04/11/2022  Admitting Diagnosis: Hiatal hernia    Discharge Diagnosis Patient Active Problem List   Diagnosis Date Noted   Intra-abdominal abscess ([place]) 04/08/2022   Protein-calorie malnutrition, severe 04/03/2022   Hiatal hernia 03/24/2022   Essential hypertension 11/21/2020   Perforated abdominal viscus 07/04/2020   Family history of colonic polyps 04/04/2016    Consultants Interventional radiology Cardiothoracic surgery Infectious disease   Imaging: DG CHEST PORT 1 VIEW  Result Date: 04/11/2022 CLINICAL DATA:  [id number] Pneumothorax, right [id number] EXAM: PORTABLE CHEST 1 VIEW COMPARISON:  Chest radiograph from one day prior. FINDINGS: Stable pigtail medial basilar right chest drain. Stable cardiomediastinal silhouette with normal heart size. No pneumothorax. Small right greater than left pleural effusions are stable. Stable hazy right greater than left lung base opacities. No pulmonary edema. IMPRESSION: 1. No pneumothorax. Stable pigtail medial basilar right chest drain. 2. Stable small right greater than left pleural effusions. 3. Stable hazy right greater than left lung base opacities, favor atelectasis. Electronically Signed   By: Ilona Sorrel M.D.   On: 04/11/2022 08:16   DG Chest Port 1 View  Result Date: 04/10/2022 CLINICAL DATA:  Pleural effusion, chest tube EXAM: PORTABLE CHEST 1 VIEW COMPARISON:  Portable exam 0540 hours compared to 03/31/2022 FINDINGS: Pigtail RIGHT thoracostomy tube. Upper normal heart size. Mediastinal contours and pulmonary vascularity normal. Moderate RIGHT pleural effusion and basilar atelectasis. Smaller LEFT pleural effusion and basilar atelectasis. Upper lungs clear. Trace RIGHT pneumothorax. IMPRESSION: BILATERAL pleural effusions and basilar atelectasis, greater on RIGHT. Trace RIGHT  pneumothorax in patient with RIGHT chest tube. Electronically Signed   By: Lavonia Dana M.D.   On: 04/10/2022 08:37    Procedures Dr. Romana Juniper 03/27/2021  Procedure performed:  Laparoscopic lysis of adhesions x 75 minutes Laparoscopic Reduction of incarcerated giant paraesophageal hernia and detorsion of gastric volvulue Laparoscopic Wedge resection, fundus of stomach Laparoscopic primary repair of hiatal hernia Laparoscopic gastrostomy tube insertion Primary repair of umbilical hernia Upper endoscopy    Hospital Course:  72 y/o F with PMH previous laparotomy, repair of gastric perforation x2, and placement of G tube in 2022 who presented with nausea, vomiting, and abdominal pain. 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 She was admitted and underwent the above operation by Dr Kae Heller. Tolerated the procedure well and was admitted to the floor. She was started on TPN in anticipation of poor gastric emptying and slow advancement of diet. Diet was advanced as tolerated.  On POD#3 she had increased coughing and some shortness of breath. Chest x-ray was ordered and showed some opacification of the right lung field so CT was ordered to better evaluate. CT raised concern for recurrent hiatal hernia so UGI was ordered which was normal. After this it was felt that the opacification in the right chest cavity was likely just fluid/blood in the space previously occupied by hiatal hernia. The CT scan of the abdomen did show a pelvic fluid collection. The patient had an IR drain placed into the pelvic fluid collection on 1/12 (cx - candida albicans) and into the pleural fluid on 1/16 (NGTD 1/19). Cardiothoracic surgery is following pleural drain to ensure that the patient improves with non-operative management. The IR pleural drain was to suction for 24 hours and then water sealed and placed to a mini express. The patient completed 5 days of IV antibiotics  after surgery and then, after drain culture and CT scan results, ID was consulted for assistance with antibiotics.      On POD#15, the patient was voiding well, tolerating diet, ambulating well, pain well controlled without any medications, vital signs stable, incisions c/d/i and felt stable for discharge home.  Patient will follow up as below and knows to call with questions or concerns. G  G tube sutures removed 1/18, clamped.  On day of discharge: Pelvic drain is SS  Pleural drain is dark sanguinous  Antibiotics: Augmentin, fluconazole x 3 weeks until ID appointment   Physical Exam: General:  Alert, NAD, pleasant, comfortable Pulm: normal rate and effort on room air CV: RRR Abd:  Soft, ND, nontender, incisions C/D/I  Allergies as of 04/11/2022       Reactions   Latex Itching   hands        Medication List     TAKE these medications    acetaminophen 325 MG tablet Commonly known as: TYLENOL Take 2 tablets (650 mg total) by mouth every 6 (six) hours as needed for mild pain (or temp > 100).   amLODipine 10 MG tablet Commonly known as: NORVASC TAKE 1 TABLET(10 MG) BY MOUTH DAILY What changed:  how much to take how to take this when to take this   amoxicillin-clavulanate 875-125 MG tablet Commonly known as: AUGMENTIN Take 1 tablet by mouth 2 (two) times daily.   BD PosiFlush 0.9 % Soln injection Generic drug: sodium chloride flush Flush drainage catheter with 5-10 ml once to two times daily.   calcium carbonate 500 MG chewable tablet Commonly known as: TUMS - dosed in mg elemental calcium Chew 1 tablet by mouth daily.   fluconazole 200 MG tablet Commonly known as: DIFLUCAN Take 2 tablets (400 mg total) by mouth daily.   Lumigan 0.01 % Soln Generic drug: bimatoprost Place 1 drop into both eyes at bedtime.   polyethylene glycol powder 17 GM/SCOOP powder Commonly known as: GLYCOLAX/MIRALAX Take 17 g by mouth daily as needed for mild constipation.                Discharge Care Instructions  (From admission, onward)           Start     Ordered   04/11/22 0000  Change dressing (specify)       Comments: Dressing change: as needed to keep site clean and dry.   04/11/22 1041              Follow-up Information     Loreli Slot, MD. Go on 04/30/2022.   Specialty: Cardiothoracic Surgery Why: Appointment time is at 4:30 pm. CT chest, abdomen, pelvis will be arranged after discharge and prior to this appointment Contact information: [address] [place] [address] [phone]         Berna Bue, MD. Go on 04/24/2022.   Specialty: General Surgery Why: 9:10 AM for follow up with your surgeon. please arrive 15-20 minutes early. Contact information: [address] [place] [address] [phone]         Raymondo Band, MD Follow up.   Specialty: Infectious Diseases Why: call and confirm appointment date/time. Contact information: [address] [place] [address] [phone]         Berdine Dance, MD Follow up.   Specialties: Interventional Radiology, Radiology Why: Schedulers will contact you with date and time of follow-up appointment. Contact information: [address] [place] [address] [phone]                 Signed: Obie Dredge, [facility] 04/11/2022, 1:18 PM

## 2022-04-11 NOTE — Discharge Instructions (Signed)
[facility] LAPAROSCOPIC SURGERY: POST OP INSTRUCTIONS Always review your discharge instruction sheet given to you by the facility where your surgery was performed. IF YOU HAVE DISABILITY OR FAMILY LEAVE FORMS, YOU MUST BRING THEM TO THE OFFICE FOR PROCESSING.   DO NOT GIVE THEM TO YOUR DOCTOR.  PAIN CONTROL  First take acetaminophen (Tylenol) AND/or ibuprofen (Advil) to control your pain after surgery.  Follow directions on package.  Taking acetaminophen (Tylenol) and/or ibuprofen (Advil) regularly after surgery will help to control your pain and lower the amount of prescription pain medication you may need.  You should not take more than 3,000 mg (3 grams) of acetaminophen (Tylenol) in 24 hours.  You should not take ibuprofen (Advil), aleve, motrin, naprosyn or other NSAIDS if you have a history of stomach ulcers or chronic kidney disease.  A prescription for pain medication may be given to you upon discharge.  Take your pain medication as prescribed, if you still have uncontrolled pain after taking acetaminophen (Tylenol) or ibuprofen (Advil). Use ice packs to help control pain. If you need a refill on your pain medication, please contact your pharmacy.  They will contact our office to request authorization. Prescriptions will not be filled after 5pm or on week-ends.  HOME MEDICATIONS Take your usually prescribed medications unless otherwise directed.  DIET You should follow a light diet the first few days after arrival home.  Be sure to include lots of fluids daily. Avoid fatty, fried foods.   CONSTIPATION It is common to experience some constipation after surgery and if you are taking pain medication.  Increasing fluid intake and taking a stool softener (such as Colace) will usually help or prevent this problem from occurring.  A mild laxative (Milk of Magnesia or Miralax) should be taken according to package instructions if there are no bowel movements after 48  hours.  WOUND/INCISION CARE Most patients will experience some swelling and bruising in the area of the incisions.  Ice packs will help.  Swelling and bruising can take several days to resolve.  Unless discharge instructions indicate otherwise, follow guidelines below  STERI-STRIPS - you may remove your outer bandages 48 hours after surgery, and you may shower at that time.  You have steri-strips (small skin tapes) in place directly over the incision.  These strips should be left on the skin for 7-10 days.   DERMABOND/SKIN GLUE - you may shower in 24 hours.  The glue will flake off over the next 2-3 weeks. Any sutures or staples will be removed at the office during your follow-up visit.  ACTIVITIES You may resume regular (light) daily activities beginning the next day--such as daily self-care, walking, climbing stairs--gradually increasing activities as tolerated.  You may have sexual intercourse when it is comfortable.  Refrain from any heavy lifting or straining until approved by your doctor. You may drive when you are no longer taking prescription pain medication, you can comfortably wear a seatbelt, and you can safely maneuver your car and apply brakes.  FOLLOW-UP You should see your doctor in the office for a follow-up appointment approximately 2-3 weeks after your surgery.  You should have been given your post-op/follow-up appointment when your surgery was scheduled.  If you did not receive a post-op/follow-up appointment, make sure that you call for this appointment within a day or two after you arrive home to insure a convenient appointment time.   WHEN TO CALL YOUR DOCTOR: Fever over 101.0 Inability to urinate Continued bleeding from incision.   Increased pain, redness, or drainage from the incision. Increasing abdominal pain  The clinic staff is available to answer your questions during regular business hours.  Please don't hesitate to call and ask to speak to one of the nurses for  clinical concerns.  If you have a medical emergency, go to the nearest emergency room or call 911.  A surgeon from [facility] is always on call at the hospital. [address], [address], [place], [place]  [address] ? [address] [address], [place], [place]   [address] [phone] ? [phone] ? FAX [phone] Web site: [email or web address]  

## 2022-04-13 LAB — AEROBIC/ANAEROBIC CULTURE W GRAM STAIN (SURGICAL/DEEP WOUND): Culture: NO GROWTH

## 2022-04-15 ENCOUNTER — Other Ambulatory Visit (HOSPITAL_COMMUNITY): Payer: Self-pay | Admitting: Surgery

## 2022-04-15 DIAGNOSIS — K651 Peritoneal abscess: Secondary | ICD-10-CM

## 2022-04-25 ENCOUNTER — Ambulatory Visit (HOSPITAL_COMMUNITY)
Admission: RE | Admit: 2022-04-25 | Discharge: 2022-04-25 | Disposition: A | Discharge status: Home / Self Care | Payer: Medicare Other | Source: Ambulatory Visit | Attending: Radiology | Admitting: Radiology

## 2022-04-25 ENCOUNTER — Ambulatory Visit
Admission: RE | Admit: 2022-04-25 | Discharge: 2022-04-25 | Disposition: A | Discharge status: Home / Self Care | Payer: Medicare Other | Source: Ambulatory Visit | Attending: Surgery | Admitting: Surgery

## 2022-04-25 ENCOUNTER — Ambulatory Visit (HOSPITAL_COMMUNITY)
Admission: RE | Admit: 2022-04-25 | Discharge: 2022-04-25 | Disposition: A | Discharge status: Home / Self Care | Payer: Medicare Other | Source: Ambulatory Visit | Attending: Interventional Radiology | Admitting: Interventional Radiology

## 2022-04-25 ENCOUNTER — Other Ambulatory Visit (HOSPITAL_COMMUNITY): Payer: Self-pay | Admitting: Interventional Radiology

## 2022-04-25 ENCOUNTER — Other Ambulatory Visit (HOSPITAL_COMMUNITY): Payer: Self-pay | Admitting: Surgery

## 2022-04-25 ENCOUNTER — Ambulatory Visit
Admission: RE | Admit: 2022-04-25 | Discharge: 2022-04-25 | Disposition: A | Discharge status: Home / Self Care | Payer: Medicare Other | Source: Ambulatory Visit | Attending: Student | Admitting: Student

## 2022-04-25 DIAGNOSIS — K449 Diaphragmatic hernia without obstruction or gangrene: Secondary | ICD-10-CM | POA: Diagnosis not present

## 2022-04-25 DIAGNOSIS — I10 Essential (primary) hypertension: Secondary | ICD-10-CM | POA: Insufficient documentation

## 2022-04-25 DIAGNOSIS — J90 Pleural effusion, not elsewhere classified: Secondary | ICD-10-CM

## 2022-04-25 DIAGNOSIS — K651 Peritoneal abscess: Secondary | ICD-10-CM

## 2022-04-25 DIAGNOSIS — K219 Gastro-esophageal reflux disease without esophagitis: Secondary | ICD-10-CM | POA: Diagnosis not present

## 2022-04-25 DIAGNOSIS — Z4803 Encounter for change or removal of drains: Secondary | ICD-10-CM | POA: Diagnosis not present

## 2022-04-25 DIAGNOSIS — Z4682 Encounter for fitting and adjustment of non-vascular catheter: Secondary | ICD-10-CM | POA: Diagnosis not present

## 2022-04-25 DIAGNOSIS — I2699 Other pulmonary embolism without acute cor pulmonale: Secondary | ICD-10-CM | POA: Insufficient documentation

## 2022-04-25 MED ORDER — LIDOCAINE HCL 1 % IJ SOLN
INTRAMUSCULAR | Status: AC
  Administered 2022-04-25: 12 mL
  Filled 2022-04-25: qty 20

## 2022-04-25 MED ORDER — IOPAMIDOL (ISOVUE-300) INJECTION 61%
100.0000 mL | Freq: Once | INTRAVENOUS | Status: AC | PRN
  Administered 2022-04-25: 100 mL via INTRAVENOUS

## 2022-04-25 MED ORDER — IOPAMIDOL (ISOVUE-300) INJECTION 61%
30.0000 mL | Freq: Once | INTRAVENOUS | Status: AC | PRN
  Administered 2022-04-25: 30 mL

## 2022-04-25 NOTE — Procedures (Signed)
Ultrasound-guided  therapeutic right thoracentesis performed yielding 1.2 liters of hazy,amber colored fluid. No immediate complications. Follow-up chest x-ray pending. EBL none. Pt's preexisting right chest tube was removed afterwards in its entirety without immediate complications. Gauze dressing applied to site.

## 2022-04-25 NOTE — Progress Notes (Signed)
Patient ID: Alyssa Peterson, female   DOB: [birth year], 72 y.o.   MRN: [id number]        Chief Complaint: Drainage catheter evaluation and management  Referring Physician(s): Fredricka Bonine  History of Present Illness: Alyssa Peterson is a 72 y.o. 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   The above complex operative intervention was complicated by development of postoperative pelvic and subpulmonic fluid collections requiring percutaneous drainage of the pelvic fluid collection on 04/04/2022 and percutaneous drainage of the subpulmonic fluid collection on 04/08/2022.  She presents today to the interventional radiology drain clinic for drainage catheter evaluation and management.  She presents today to the interventional radiology drain clinic for drainage catheter evaluation and management.  The reports little to no out from either of her drainage catheters.  She denies worsening shortness of breath.  No fever or chills.  She reports bilateral lower extremity swelling but no asymmetric pain or erythema.     Past Medical History:  Diagnosis Date   Anemia    GERD (gastroesophageal reflux disease)    Hypertension    Inguinal hernia    left    Past Surgical History:  Procedure Laterality Date   CYST REMOVAL NECK     DENTAL SURGERY     FEMORAL HERNIA REPAIR Left 07/17/2016   Procedure: LEFT FEMORAL  HERNIA REPAIR WITH MESH;  Surgeon: Manus Rudd, MD;  Location: [facility];  Service: General;  Laterality: Left;   GASTROJEJUNOSTOMY N/A 07/04/2020   Procedure: GASTROSTOMY TUBE;  Surgeon: Berna Bue, MD;  Location: [facility] OR;  Service: General;  Laterality: N/A;   GASTROSTOMY N/A 03/27/2022   Procedure: INSERTION OF GASTROSTOMY TUBE;   Surgeon: Berna Bue, MD;  Location: [facility] OR;  Service: General;  Laterality: N/A;   HIATAL HERNIA REPAIR N/A 03/27/2022   Procedure: LAPAROSCOPIC REPAIR OF HIATAL HERNIA;  Surgeon: Berna Bue, MD;  Location: [facility] OR;  Service: General;  Laterality: N/A;   LAPAROSCOPIC LYSIS OF ADHESIONS N/A 03/27/2022   Procedure: LAPAROSCOPIC LYSIS OF ADHESIONS;  Surgeon: Berna Bue, MD;  Location: [facility] OR;  Service: General;  Laterality: N/A;   LAPAROTOMY N/A 07/04/2020   Procedure: EXPLORATORY LAPAROTOMY;  Surgeon: Berna Bue, MD;  Location: [facility] OR;  Service: General;  Laterality: N/A;    Allergies: Latex  Medications: Prior to Admission medications   Medication Sig Start Date End Date Taking? Authorizing Provider  acetaminophen (TYLENOL) 325 MG tablet Take 2 tablets (650 mg total) by mouth every 6 (six) hours as needed for mild pain (or temp > 100). 04/11/22   Adam Phenix, PA-C  amLODipine (NORVASC) 10 MG tablet TAKE 1 TABLET(10 MG) BY MOUTH DAILY Patient taking differently: Take 10 mg by mouth at bedtime. TAKE 1 TABLET(10 MG) BY MOUTH DAILY 01/07/22   Claiborne Rigg, NP  amoxicillin-clavulanate (AUGMENTIN) 875-125 MG tablet Take 1 tablet by mouth 2 (two) times daily. 04/11/22 05/20/22  Vu, Tonita Phoenix, MD  calcium carbonate (TUMS - DOSED IN MG ELEMENTAL CALCIUM) 500 MG chewable tablet Chew 1 tablet by mouth daily.    [provider]  fluconazole (DIFLUCAN) 200 MG tablet Take 2 tablets (400 mg total) by mouth daily. 04/11/22 05/20/22  Vu, Gershon Mussel T, MD  LUMIGAN 0.01 % SOLN Place 1 drop into both eyes  at bedtime. 01/28/22   [provider]  polyethylene glycol powder (GLYCOLAX/MIRALAX) 17 GM/SCOOP powder Take 17 g by mouth daily as needed for mild constipation. 04/11/22   Jill Alexanders, PA-C  Sodium Chloride Flush (SALINE FLUSH) 0.9 % SOLN Flush drainage catheter with 5-10 ml once to two times daily. 04/11/22 05/11/22  Docia Barrier, PA     No family  history on file.  Social History   Socioeconomic History   Marital status: Divorced    Spouse name: Not on file   Number of children: Not on file   Years of education: Not on file   Highest education level: Not on file  Occupational History   Not on file  Tobacco Use   Smoking status: Never   Smokeless tobacco: Never  Vaping Use   Vaping Use: Never used  Substance and Sexual Activity   Alcohol use: No    Alcohol/week: 0.0 standard drinks of alcohol   Drug use: No   Sexual activity: Not on file  Other Topics Concern   Not on file  Social History Narrative   Not on file   Social Determinants of Health   Financial Resource Strain: Not on file  Food Insecurity: No Food Insecurity (03/24/2022)   Hunger Vital Sign    Worried About Running Out of Food in the Last Year: Never true    Ran Out of Food in the Last Year: Never true  Transportation Needs: No Transportation Needs (03/24/2022)   PRAPARE - Hydrologist (Medical): No    Lack of Transportation (Non-Medical): No  Physical Activity: Not on file  Stress: Not on file  Social Connections: Not on file    ECOG Status: 2 - Symptomatic, <50% confined to bed  Review of Systems: A 12 point ROS discussed and pertinent positives are indicated in the HPI above.  All other systems are negative.  Review of Systems  Vital Signs: There were no vitals taken for this visit.   Physical Exam  Mallampati Score:     Imaging: DG CHEST PORT 1 VIEW  Result Date: 04/11/2022 CLINICAL DATA:  [id number] Pneumothorax, right [id number] EXAM: PORTABLE CHEST 1 VIEW COMPARISON:  Chest radiograph from one day prior. FINDINGS: Stable pigtail medial basilar right chest drain. Stable cardiomediastinal silhouette with normal heart size. No pneumothorax. Small right greater than left pleural effusions are stable. Stable hazy right greater than left lung base opacities. No pulmonary edema. IMPRESSION: 1. No pneumothorax. Stable  pigtail medial basilar right chest drain. 2. Stable small right greater than left pleural effusions. 3. Stable hazy right greater than left lung base opacities, favor atelectasis. Electronically Signed   By: Ilona Sorrel M.D.   On: 04/11/2022 08:16   DG Chest Port 1 View  Result Date: 04/10/2022 CLINICAL DATA:  Pleural effusion, chest tube EXAM: PORTABLE CHEST 1 VIEW COMPARISON:  Portable exam 0540 hours compared to 03/31/2022 FINDINGS: Pigtail RIGHT thoracostomy tube. Upper normal heart size. Mediastinal contours and pulmonary vascularity normal. Moderate RIGHT pleural effusion and basilar atelectasis. Smaller LEFT pleural effusion and basilar atelectasis. Upper lungs clear. Trace RIGHT pneumothorax. IMPRESSION: BILATERAL pleural effusions and basilar atelectasis, greater on RIGHT. Trace RIGHT pneumothorax in patient with RIGHT chest tube. Electronically Signed   By: Lavonia Dana M.D.   On: 04/10/2022 08:37   CT GUIDED VISCERAL FLUID DRAIN BY PERC CATH  Result Date: 04/08/2022 CLINICAL DATA:  Loculated subpulmonic pleural fluid collection at the level of a prior large hiatal  hernia sac after prior surgical repair. EXAM: CT GUIDED CATHETER DRAINAGE OF LOCULATED PLEURAL FLUID COLLECTION ANESTHESIA/SEDATION: Moderate (conscious) sedation was employed during this procedure. A total of Versed 1.0 mg and Fentanyl 50 mcg was administered intravenously by radiology nursing. Moderate Sedation Time: 41 minutes. The patient's level of consciousness and vital signs were monitored continuously by radiology nursing throughout the procedure under my direct supervision. PROCEDURE: The procedure, risks, benefits, and alternatives were explained to the patient. Questions regarding the procedure were encouraged and answered. The patient understands and consents to the procedure. A time out was performed prior to initiating the procedure. CT was performed in multiple body positions through the chest. Ultimately, the procedure  was performed in the right lateral decubitus position with the right side down. The right posterior chest wall was prepped with chlorhexidine in a sterile fashion, and a sterile drape was applied covering the operative field. A sterile gown and sterile gloves were used for the procedure. Local anesthesia was provided with 1% Lidocaine. An 18 gauge trocar needle was advanced through the posterior chest wall to the level of a right loculated pleural fluid collection. A small amount of fluid was aspirated after confirming needle tip position. A guidewire was advanced through the needle and the needle removed. The percutaneous tract was dilated over the wire and a 12 French pigtail drainage catheter advanced over the wire. The pigtail portion of the catheter was formed and the wire removed. Final catheter position was confirmed by CT. A fluid sample was withdrawn and sent for culture analysis. The catheter was then connected to a [employer] Pleur-evac device and secured at the skin with a Prolene retention suture and adhesive StatLock device. RADIATION DOSE REDUCTION: This exam was performed according to the departmental dose-optimization program which includes automated exposure control, adjustment of the mA and/or kV according to patient size and/or use of iterative reconstruction technique. COMPLICATIONS: None FINDINGS: Aspiration at the level of the loculated subpulmonic fluid collection yielded dark bloody fluid. There is good return of fluid after catheter placement. The Pleur-evac system will be connected to wall suction at -20 cm of water. IMPRESSION: Placement percutaneous pigtail drainage catheter into the loculated subpulmonic right pleural fluid collection yielding dark bloody fluid. A 12 French catheter was placed and connected to a Pleur-evac device which will be connected to wall suction. A fluid sample was sent for culture analysis. Electronically Signed   By: Irish LackGlenn  Yamagata M.D.   On: 04/08/2022 13:32    CT GUIDED PERITONEAL/RETROPERITONEAL FLUID DRAIN BY PERC CATH  Result Date: 04/04/2022 INDICATION: Postop pelvic fluid collection, concern for abscess EXAM: RIGHT TRANS GLUTEAL CT PELVIC FLUID COLLECTION DRAIN TECHNIQUE: Multidetector CT imaging of the pelvis was performed following the standard protocol without IV contrast. RADIATION DOSE REDUCTION: This exam was performed according to the departmental dose-optimization program which includes automated exposure control, adjustment of the mA and/or kV according to patient size and/or use of iterative reconstruction technique. MEDICATIONS: The patient is currently admitted to the hospital and receiving intravenous antibiotics. The antibiotics were administered within an appropriate time frame prior to the initiation of the procedure. ANESTHESIA/SEDATION: Moderate (conscious) sedation was employed during this procedure. A total of Versed 1.0 mg and Fentanyl 50 mcg was administered intravenously by the radiology nurse. Total intra-service moderate Sedation Time: 10 minutes. The patient's level of consciousness and vital signs were monitored continuously by radiology nursing throughout the procedure under my direct supervision. COMPLICATIONS: None immediate. PROCEDURE: Informed written consent was obtained from the patient  after a thorough discussion of the procedural risks, benefits and alternatives. All questions were addressed. Maximal Sterile Barrier Technique was utilized including caps, mask, sterile gowns, sterile gloves, sterile drape, hand hygiene and skin antiseptic. A timeout was performed prior to the initiation of the procedure. Previous imaging reviewed. Patient positioned prone. Noncontrast localization CT performed. The posterior pelvic cul-de-sac fluid collection was localized and marked for a right trans gluteal approach. Under sterile conditions and local anesthesia, an 18 gauge 10 cm access needle was advanced from a right trans gluteal  approach into the fluid collection. Needle position confirmed with CT. Syringe aspiration yielded thin blood tinged fluid. Guidewire inserted followed by tract dilatation insert a 10 French drain. Drain catheter position confirmed with CT. Syringe aspiration yielded 30 cc thin blood tinged slightly exudative fluid. Sample sent for culture. Catheter secured with Prolene suture and connected to external suction bulb. Sterile dressing applied. No immediate complication. Patient tolerated the procedure well. IMPRESSION: Successful CT-guided right transgluteal pelvic fluid collection drain placement Electronically Signed   By: Judie Petit.  Shick M.D.   On: 04/04/2022 16:30   CT CHEST ABDOMEN PELVIS W CONTRAST  Addendum Date: 04/04/2022   ADDENDUM REPORT: 04/04/2022 11:30 ADDENDUM: Correction: IMPRESSION: 1. A 11.0 x 9.1 centimeters rim enhancing fluid collection in the RIGHT mid chest, consistent with isolated hernia sac. Locules of gas are identified within this collection. These results will be called to the ordering clinician or representative by the Radiologist Assistant, and communication documented in the PACS or [employer]. Electronically Signed   By: Norva Pavlov M.D.   On: 04/04/2022 11:30   Result Date: 04/04/2022 CLINICAL DATA:  Rule out intra-abdominal abscess. Re-evaluate fluid collection in the chest from hiatal hernia sac. Patient had hernia repair, gastrostomy tube insertion, lysis of adhesions for incarcerated paraesophageal hernia on 03/27/2022. EXAM: CT CHEST, ABDOMEN, AND PELVIS WITH CONTRAST TECHNIQUE: Multidetector CT imaging of the chest, abdomen and pelvis was performed following the standard protocol during bolus administration of intravenous contrast. RADIATION DOSE REDUCTION: This exam was performed according to the departmental dose-optimization program which includes automated exposure control, adjustment of the mA and/or kV according to patient size and/or use of iterative  reconstruction technique. CONTRAST:  78mL OMNIPAQUE IOHEXOL 350 MG/ML SOLN COMPARISON:  CT of the abdomen and pelvis on 03/24/2022, UPPER GI on 04/01/2022 and CT chest on 03/31/2022 FINDINGS: CT CHEST FINDINGS Cardiovascular: Heart size is normal. No significant coronary artery calcifications or atherosclerosis of the thoracic aorta. Patient has RIGHT PICC line, tip to the superior vena cava. Mediastinum/Nodes: Scattered calcifications and small low-attenuation lesions are identified within the thyroid, largest measuring 7 millimeters in the LEFT lobe (image 31 of series 3). Esophagus is unremarkable. Oral contrast empties into the stomach. Lungs/Pleura: Within the RIGHT mid chest, there is a rim enhancing fluid collection containing locules of gas, consistent with isolated hernia sac. Collection measures 11.0 x 9.1 centimeters on image 33 of series 3. Locules of gas are identified within this collection. In addition, there is RIGHT pleural effusion similar in volume compared to prior study. RIGHT LOWER and RIGHT middle lobe consolidation/atelectasis again noted. There is no pneumothorax. Airways are patent. Musculoskeletal: No chest wall mass or suspicious bone lesions identified. CT ABDOMEN PELVIS FINDINGS Hepatobiliary: Benign hepatic cyst measuring 1.4 centimeters in the RIGHT hepatic dome. No further imaging is recommended. Otherwise the liver is unremarkable. Gallbladder is present. Pancreas: Unremarkable. No pancreatic ductal dilatation or surrounding inflammatory changes. Spleen: Normal in size without focal abnormality. Adrenals/Urinary Tract:  Adrenal glands are normal. Kidneys are symmetric in size and enhancement/excretion. No hydronephrosis. Ureters are unremarkable. The bladder and visualized portion of the urethra are normal. Stomach/Bowel: The gastroesophageal junction is patent. There are surgical clips along the proximal greater curvature of the stomach. There is postoperative thickening of the  proximal stomach wall. No residual hernia is identified. Interval placement of gastrostomy tube in the appropriate location. Small bowel loops are normal in appearance. There are numerous colonic diverticula, many of which contain contrast. There is mild mesenteric edema, limiting evaluation for acute diverticulitis, though none is seen. The appendix is well seen and normal in appearance. Vascular/Lymphatic: No significant vascular findings are present. No enlarged abdominal or pelvic lymph nodes. Reproductive: Uterus is present and contains numerous fibroids. There has been interval development rim enhancing pelvic fluid collections. The largest is in the cul-de-sac and extends into the RIGHT adnexal region, measuring 7.9 x 3.8 centimeters. A second in the LEFT adnexal region is 2.9 x 2.7 centimeters on image 97 of series 3. This collection is posterior to the LEFT ovary. Other: Postoperative changes in the anterior abdominal wall. Mild body wall edema. Musculoskeletal: Mild degenerative changes in the lumbosacral spine. IMPRESSION: 1. A 1.0 x 9.1 centimeters rim enhancing fluid collection in the RIGHT mid chest, consistent with isolated hernia sac. Locules of gas are identified within this collection. 2. Stable RIGHT pleural effusion and RIGHT LOWER lobe and RIGHT middle lobe consolidation/atelectasis. 3. Interval placement of gastrostomy tube in appropriate location. 4. Interval development of rim enhancing pelvic fluid collections, largest in the cul-de-sac and extends into the RIGHT adnexal region, measuring 7.9 x 3.8 centimeters. Findings are suspicious for abscesses. 5. Colonic diverticulosis. 6. Fibroid uterus. 7. Mild body wall edema. 8. Subcentimeter incidental left thyroid nodule. No follow-up imaging is recommended. Reference: J Am Coll Radiol. 2015 Feb;12(2): 143-50 Electronically Signed: By: Nolon Nations M.D. On: 04/04/2022 11:19   DG UGI W SINGLE CM (SOL OR THIN BA)  Result Date:  04/01/2022 CLINICAL DATA:  Patient with incarcerated paraesophageal hernia status post lysis of adhesions, hernia repair, gastrostomy tube insertion on January 4th 2024. Request is for single upper GI for further evaluation possible hernia EXAM: DG UGI W SINGLE CM TECHNIQUE: Scout radiograph was obtained. Single contrast examination was performed using thin liquid barium. This exam was performed by Rushie Nyhan, and was supervised and interpreted by Dr. Kalman Jewels. FLUOROSCOPY: Radiation Exposure Index (as provided by the fluoroscopic device): 22.0 mGy Kerma COMPARISON:  None Available. FINDINGS: Scout Radiograph: Within normal limits. Esophagus:  Normal appearance. 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 Gastroesophageal reflux:  None visualized. Ingested 79mm barium tablet:  Not given Stomach: Postoperative changes from hiatal hernia repair. No recurrent hernia. Prior CT scan shows a large right pleural space complex fluid collection likely a postoperative hematoma. Gastric emptying: Normal. Duodenum:  Normal appearance. Other:  None. IMPRESSION: 1.  No mass, stricture, or gastroesophageal reflux noted 2. 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  No hiatal hernia. Electronically Signed   By: Marijo Sanes M.D.   On: 04/01/2022 12:27   CT CHEST WO CONTRAST  Result Date: 03/31/2022 CLINICAL DATA:  Pneumonia, complication suspected. X-ray done. Status post hiatal hernia repair. Coughing with fluid retention in the hernia sac. Evaluation for better idea if there is more going on the simple effusion. EXAM: CT CHEST WITHOUT CONTRAST TECHNIQUE: Multidetector CT imaging of the chest was performed following the standard protocol without IV contrast.  RADIATION DOSE REDUCTION: This exam was performed according to the departmental dose-optimization program which includes automated exposure control, adjustment of the mA  and/or kV according to patient size and/or use of iterative reconstruction technique. COMPARISON:  Radiographs earlier today and CT abdomen and pelvis 03/24/2022 FINDINGS: Evaluation is limited by lack of IV contrast. Cardiovascular: Heart size upper limits of normal. Small pericardial effusion. Right PICC tip in the low SVC. Mediastinum/Nodes: No definite adenopathy contrast. Fluid level within the esophagus reaching near the thoracic inlet. The mid and distal esophagus are poorly visualized. Redemonstrated large hiatal hernia with intrathoracic stomach in the right hemithorax. Lungs/Pleura: Complete collapse of the right middle and lower lobes with mucous plugging. Small-moderate low-density right pleural effusion (Hounsfield units approximately 9). Small left pleural effusion and partial atelectasis of the left lower lobe with mucous plugging. Atelectasis in the lingula. Upper Abdomen: New surgical clips near the expected location of the gastroesophageal junction likely related to hernia repair on 03/27/2022. Partially visualized stranding and edema about the intraabdominal portion of the stomach. New herniation of the anterior left lobe of the liver along with fat and locules of gas into a subxiphoid hernia in the anterior chest/abdominal wall Musculoskeletal: No acute osseous abnormality. IMPRESSION: 1. Redemonstrated large hiatal hernia with intrathoracic portions of the stomach in the right hemithorax. 2. Complete collapse of the right middle and lower lobe and partial collapse of the left lower lobe with mucous plugging. This is favored due to aspiration given large hernia and fluid level in the esophagus. 3. Small-moderate right and small left pleural effusions are the density of simple fluid. 4. Marked stranding and edema in the upper abdomen about the intraabdominal portion of the stomach. Partial herniation of the right lobe of the liver along with fat and locules of gas into a subxiphoid hernia. These  findings may be postoperative in nature however if there is concern for perforation, CT chest and abdomen with IV and oral contrast is recommended. This case was discussed by telephone at the time of interpretation on 03/31/2022 at 11:53 pm to provider Dr. Bobbye Morton, who verbally acknowledged these results. Electronically Signed   By: Placido Sou M.D.   On: 03/31/2022 23:54   DG CHEST PORT 1 VIEW  Result Date: 03/31/2022 CLINICAL DATA:  Pleural effusion.  Follow-up exam. EXAM: PORTABLE CHEST 1 VIEW COMPARISON:  03/28/2022. FINDINGS: Opacity obscures the lower half of the right hemithorax, unchanged from the prior exam. There is also opacity at the left lung base obscuring the hemidiaphragm, also unchanged. Remainder of the lungs is clear. No pneumothorax. Right PICC stable and well positioned. IMPRESSION: 1. No significant change from the most recent prior exam. 2. Right mid to lower hemithorax opacity and left lung base opacity consistent with a combination of pleural effusions and atelectasis and/or pneumonia. No evidence of pulmonary edema. Electronically Signed   By: Lajean Manes M.D.   On: 03/31/2022 14:15   DG CHEST PORT 1 VIEW  Result Date: 03/29/2022 CLINICAL DATA:  Shortness of breath. Laparoscopic repair of hiatal hernia. EXAM: PORTABLE CHEST 1 VIEW COMPARISON:  CT abdomen and pelvis 03/24/2022 FINDINGS: There is opacification in the inferior right hemithorax which may be related to combination of pleural fluid, atelectasis/airspace disease or residual hernia. The left lung is clear. There is no pneumothorax or mediastinal shift. The cardiomediastinal silhouette is within normal limits. Right-sided central venous catheter tip projects over the SVC. No acute fractures are seen. IMPRESSION: Opacification in the inferior right hemithorax may be related  to combination of pleural fluid, atelectasis/airspace disease or residual hernia. No pneumothorax or mediastinal shift. Electronically Signed   By: Darliss Cheney M.D.   On: 03/29/2022 00:00   [place] EKG SITE RITE  Result Date: 03/27/2022 If Site Rite image not attached, placement could not be confirmed due to current cardiac rhythm.   Labs:  CBC: Recent Labs    04/05/22 0309 04/06/22 0453 04/07/22 1238 04/09/22 0904  WBC 14.6* 16.5* 14.9* 9.5  HGB 10.7* 10.6* 10.6* 11.2*  HCT 31.9* 32.3* 32.6* 33.8*  PLT 300 353 409* 408*    COAGS: Recent Labs    04/07/22 1625  INR 1.2    BMP: Recent Labs    04/03/22 0330 04/04/22 0230 04/09/22 0904 04/11/22 0340  NA 136 134* 137 138  K 4.2 3.9 3.4* 3.6  CL 103 103 106 105  CO2 25 25 25 25   GLUCOSE 125* 140* 112* 115*  BUN 9 10 11 10   CALCIUM 8.2* 8.2* 8.5* 8.6*  CREATININE 0.57 0.63 0.66 0.86  GFRNONAA >60 >60 >60 >60    LIVER FUNCTION TESTS: Recent Labs    03/24/22 0456 03/28/22 0413 03/31/22 0341 04/03/22 0330  BILITOT 0.9 1.1 1.0 0.9  AST 73* 40 15 19  ALT 67* 45* 21 19  ALKPHOS 85 38 47 73  PROT 6.9 4.7* 4.7* 5.0*  ALBUMIN 3.8 2.6* 1.8* 1.8*    TUMOR MARKERS: No results for input(s): "AFPTM", "CEA", "CA199", "CHROMGRNA" in the last 8760 hours.  Assessment and Plan:  Alyssa Peterson is a 72 y.o. 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  The above complex operative intervention was complicated by development of postoperative pelvic and subpulmonic fluid collections requiring percutaneous drainage of the pelvic fluid collection on 04/04/2022 and percutaneous drainage of the subpulmonic fluid collection on 04/08/2022  She presents today to the interventional radiology drain clinic for drainage catheter evaluation and management.  CT abdomen and pelvis performed today demonstrates the following: - Stable positioning of right trans  gluteal approach percutaneous drainage catheter with near complete resolution of dominant collection within the pelvic cul-de-sac.  Tiny collection within left hemipelvis measures approximately 2.1 x 1.4 cm.  - Stable positioning right sided approach percutaneous drainage catheter into residual subpulmonic fluid collection measuring 4.1 cm, previously, 11 cm though now with development of a moderate-to-large sized right-sided effusion with associated complete atelectasis/collapse of the right lower and middle lobes.  - Incidentally noted small burden pulmonary embolism within left lower lobe segmental pulmonary arteries.   Subsequent drainage catheter injection demonstrates the following:  - Injection of right trans gluteal approach percutaneous catheter is negative for the presence of enteric fistula.  As such, this drainage catheter was removed intact.  - Injection of the right posterior approach subpulmonic drainage catheter demonstrates opacification of the largely decompressed subpulmonic fluid collection with passage of contrast into the large right-sided pleural effusion.  Despite this, no significant fluid could be aspirated from the subpulmonic drainage catheter.   PLAN:   - The following the injection of the right posterior approach subpulmonic drainage catheter, the patient was arranged to undergo attempted right-sided thoracentesis, ultimately yielding 1.1 L with subsequent removal or right sided  chest tube at [facility] this afternoon.   As such, patient may follow-up at the IR drain clinic  on a PRN basis.  - The finding of incidentally discovered small burden left lower lobe pulmonary embolism was discussed with referring surgeon, Dr. Fredricka Bonineonnor, following this consultation.   A copy of this report was sent to the requesting provider on this date.  Electronically Signed: Simonne ComeJohn Kayliah Tindol 04/25/2022, 12:22 PM   I spent a total of 15 Minutes in face to face in clinical consultation,  greater than 50% of which was counseling/coordinating care for drainage catheter evaluation and management.

## 2022-04-28 ENCOUNTER — Ambulatory Visit (HOSPITAL_COMMUNITY)
Admission: RE | Admit: 2022-04-28 | Discharge: 2022-04-28 | Disposition: A | Discharge status: Home / Self Care | Payer: Medicare Other | Source: Ambulatory Visit | Attending: Thoracic Surgery (Cardiothoracic Vascular Surgery) | Admitting: Thoracic Surgery (Cardiothoracic Vascular Surgery)

## 2022-04-28 DIAGNOSIS — K449 Diaphragmatic hernia without obstruction or gangrene: Secondary | ICD-10-CM | POA: Insufficient documentation

## 2022-04-28 DIAGNOSIS — J90 Pleural effusion, not elsewhere classified: Secondary | ICD-10-CM | POA: Diagnosis not present

## 2022-04-28 DIAGNOSIS — J9811 Atelectasis: Secondary | ICD-10-CM | POA: Diagnosis not present

## 2022-04-28 MED ORDER — IOHEXOL 350 MG/ML SOLN
75.0000 mL | Freq: Once | INTRAVENOUS | Status: AC | PRN
  Administered 2022-04-28: 75 mL via INTRAVENOUS

## 2022-04-29 ENCOUNTER — Encounter: Payer: Self-pay | Admitting: Vascular Surgery

## 2022-04-29 ENCOUNTER — Ambulatory Visit (INDEPENDENT_AMBULATORY_CARE_PROVIDER_SITE_OTHER): Payer: Medicare Other | Admitting: Thoracic Surgery (Cardiothoracic Vascular Surgery)

## 2022-04-29 ENCOUNTER — Other Ambulatory Visit: Payer: Self-pay

## 2022-04-29 ENCOUNTER — Encounter: Payer: Self-pay | Admitting: Thoracic Surgery (Cardiothoracic Vascular Surgery)

## 2022-04-29 ENCOUNTER — Ambulatory Visit: Payer: Medicare Other | Admitting: Vascular Surgery

## 2022-04-29 VITALS — BP 105/74 | HR 110 | Temp 98.9°F | Resp 20 | Ht 63.0 in | Wt 114.0 lb

## 2022-04-29 VITALS — BP 114/77 | HR 124 | Resp 20 | Ht 63.0 in | Wt 114.0 lb

## 2022-04-29 DIAGNOSIS — I2699 Other pulmonary embolism without acute cor pulmonale: Secondary | ICD-10-CM | POA: Insufficient documentation

## 2022-04-29 DIAGNOSIS — I2782 Chronic pulmonary embolism: Secondary | ICD-10-CM

## 2022-04-29 DIAGNOSIS — Z86711 Personal history of pulmonary embolism: Secondary | ICD-10-CM | POA: Insufficient documentation

## 2022-04-29 DIAGNOSIS — J90 Pleural effusion, not elsewhere classified: Secondary | ICD-10-CM

## 2022-04-29 NOTE — H&P (View-Only) (Signed)
[facility] [place]  ASSESSMENT / PLAN: 72 y.o. female with history of DVT/PE on Eliquis. She is planned to undergo decortication with Dr. Roxan Hockey for loculated effusion.  Will plan to place an IVC filter in the Cath Lab soon as possible.  No need to hold Eliquis for IVC filter placement.   CHIEF COMPLAINT: History of DVT, planned thoracic surgery  HISTORY OF PRESENT ILLNESS: Alyssa Peterson is a 72 y.o. female referred to clinic for evaluation of possible IVC filter placement.  The patient is a very pleasant woman who underwent laparoscopic esophageal hernia repair.  This complicated by large pleural effusion which required tube thoracostomy.  The effusion reaccumulated, and is now loculated.  Dr. Roxan Hockey saw the patient earlier today, and recommended decortication.  The patient's history is also significant for pulmonary embolism, for which she is on Eliquis.  This was recently diagnosed, and the patient needs to complete a course of treatment before discontinuing her anticoagulation altogether.  For this reason the IVC filter was requested.  Past Medical History:  Diagnosis Date   Anemia    Diaphragmatic hernia with obstruction    Gastric volvulus    GERD (gastroesophageal reflux disease)    Hypertension    Inguinal hernia    left   Pulmonary embolism [facility])     Past Surgical History:  Procedure Laterality Date   CYST REMOVAL NECK     DENTAL SURGERY     FEMORAL HERNIA REPAIR Left 07/17/2016   Procedure: LEFT FEMORAL  HERNIA REPAIR WITH MESH;  Surgeon: Donnie Mesa, MD;  Location: [place];  Service: General;  Laterality: Left;   GASTROJEJUNOSTOMY N/A 07/04/2020   Procedure: GASTROSTOMY TUBE;  Surgeon: Clovis Riley, MD;  Location: [place];  Service: General;  Laterality: N/A;   GASTROSTOMY N/A 03/27/2022   Procedure: INSERTION OF GASTROSTOMY TUBE;  Surgeon: Clovis Riley, MD;  Location: [place];  Service: General;  Laterality: N/A;    HIATAL HERNIA REPAIR N/A 03/27/2022   Procedure: LAPAROSCOPIC REPAIR OF HIATAL HERNIA;  Surgeon: Clovis Riley, MD;  Location: [place];  Service: General;  Laterality: N/A;   LAPAROSCOPIC LYSIS OF ADHESIONS N/A 03/27/2022   Procedure: LAPAROSCOPIC LYSIS OF ADHESIONS;  Surgeon: Clovis Riley, MD;  Location: [place];  Service: General;  Laterality: N/A;   LAPAROTOMY N/A 07/04/2020   Procedure: EXPLORATORY LAPAROTOMY;  Surgeon: Clovis Riley, MD;  Location: [place];  Service: General;  Laterality: N/A;    History reviewed. No pertinent family history.  Social History   Socioeconomic History   Marital status: Divorced    Spouse name: Not on file   Number of children: Not on file   Years of education: Not on file   Highest education level: Not on file  Occupational History   Not on file  Tobacco Use   Smoking status: Never   Smokeless tobacco: Never  Vaping Use   Vaping Use: Never used  Substance and Sexual Activity   Alcohol use: No    Alcohol/week: 0.0 standard drinks of alcohol   Drug use: No   Sexual activity: Not on file  Other Topics Concern   Not on file  Social History Narrative   Not on file   Social Determinants of Health   Financial Resource Strain: Not on file  Food Insecurity: No Food Insecurity (03/24/2022)   Hunger Vital Sign    Worried About Running Out of Food in the Last Year: Never true  Ran Out of Food in the Last Year: Never true  Transportation Needs: No Transportation Needs (03/24/2022)   PRAPARE - Hydrologist (Medical): No    Lack of Transportation (Non-Medical): No  Physical Activity: Not on file  Stress: Not on file  Social Connections: Not on file  Intimate Partner Violence: Not At Risk (03/24/2022)   Humiliation, Afraid, Rape, and Kick questionnaire    Fear of Current or Ex-Partner: No    Emotionally Abused: No    Physically Abused: No    Sexually Abused: No    Allergies  Allergen Reactions   Latex Itching     hands    Current Outpatient Medications  Medication Sig Dispense Refill   acetaminophen (TYLENOL) 325 MG tablet Take 2 tablets (650 mg total) by mouth every 6 (six) hours as needed for mild pain (or temp > 100).     amLODipine (NORVASC) 10 MG tablet TAKE 1 TABLET(10 MG) BY MOUTH DAILY (Patient taking differently: Take 10 mg by mouth at bedtime. TAKE 1 TABLET(10 MG) BY MOUTH DAILY) 90 tablet 1   amoxicillin-clavulanate (AUGMENTIN) 875-125 MG tablet Take 1 tablet by mouth 2 (two) times daily. 78 tablet 0   calcium carbonate (TUMS - DOSED IN MG ELEMENTAL CALCIUM) 500 MG chewable tablet Chew 1 tablet by mouth daily.     fluconazole (DIFLUCAN) 200 MG tablet Take 2 tablets (400 mg total) by mouth daily. 78 tablet 0   LUMIGAN 0.01 % SOLN Place 1 drop into both eyes at bedtime.     polyethylene glycol powder (GLYCOLAX/MIRALAX) 17 GM/SCOOP powder Take 17 g by mouth daily as needed for mild constipation. 238 g 0   Sodium Chloride Flush (SALINE FLUSH) 0.9 % SOLN Flush drainage catheter with 5-10 ml once to two times daily. 300 mL 0   No current facility-administered medications for this visit.    PHYSICAL EXAM Vitals:   04/29/22 1504  BP: 105/74  Pulse: (!) 110  Resp: 20  Temp: 98.9 F (37.2 C)  SpO2: 95%  Weight: 114 lb (51.7 kg)  Height: 5' 3"$  (1.6 m)   Thin woman in no acute distress Regular rate and rhythm Unlabored breathing  PERTINENT LABORATORY AND RADIOLOGIC DATA  Most recent CBC    Latest Ref Rng & Units 04/09/2022    9:04 AM 04/07/2022   12:38 PM 04/06/2022    4:53 AM  CBC  WBC 4.0 - 10.5 K/uL 9.5  14.9  16.5   Hemoglobin 12.0 - 15.0 g/dL 11.2  10.6  10.6   Hematocrit 36.0 - 46.0 % 33.8  32.6  32.3   Platelets 150 - 400 K/uL 408  409  353      Most recent CMP    Latest Ref Rng & Units 04/11/2022    3:40 AM 04/09/2022    9:04 AM 04/04/2022    2:30 AM  CMP  Glucose 70 - 99 mg/dL 115  112  140   BUN 8 - 23 mg/dL 10  11  10   $ Creatinine 0.44 - 1.00 mg/dL 0.86  0.66   0.63   Sodium 135 - 145 mmol/L 138  137  134   Potassium 3.5 - 5.1 mmol/L 3.6  3.4  3.9   Chloride 98 - 111 mmol/L 105  106  103   CO2 22 - 32 mmol/L 25  25  25   $ Calcium 8.9 - 10.3 mg/dL 8.6  8.5  8.2     Renal function Estimated  Creatinine Clearance: 50.4 mL/min (by C-G formula based on SCr of 0.86 mg/dL).  Hgb [name] MFr Bld (%)  Date Value  01/07/2022 5.9 (H)    LDL Calculated  Date Value Ref Range Status  04/16/2016 43 0 - 99 mg/dL Final     Yevonne Aline. Stanford Breed, MD FACS [facility] Phone Number: [phone] 04/29/2022 9:46 PM   Total time spent on preparing this encounter including chart review, data review, collecting history, examining the patient, coordinating care for this new patient, 45 minutes.  Portions of this report may have been transcribed using voice recognition software.  Every effort has been made to ensure accuracy; however, inadvertent computerized transcription errors may still be present.

## 2022-04-29 NOTE — H&P (View-Only) (Signed)
[address]       [place] [address]             [phone]     HPI: Alyssa Peterson returns for a follow-up of a right pleural effusion after repair of an incarcerated diaphragmatic hernia.  Alyssa Peterson is a 72 year old woman with a history of hypertension, anemia, and gastroesophageal reflux.  She presented on New Year's Day with abdominal pain.  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  She underwent laparoscopic repair by Dr. Windle Guard.  During her postoperative follow-up she was noted to have a large right pleural effusion as well as fluid within the previous hernia sac.  A pigtail catheter was placed.  She had a repeat CT on 04/25/2022.  Her pigtail was not draining any significant amount.  It was removed.  There was still a loculated effusion.  She underwent thoracentesis which drained 1.2 L of fluid.  Repeat CT on 04/28/2022 showed persistent loculated pleural effusion with compressive atelectasis of the right lower lobe.  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  She gets short of breath with even relatively light activities.  Not short of breath at rest.  She is eating well.  Past Medical History:  Diagnosis Date   Anemia    Diaphragmatic hernia with obstruction    Gastric volvulus    GERD (gastroesophageal reflux disease)    Hypertension    Inguinal hernia    left   Pulmonary embolism ([place])    Past Surgical History:  Procedure Laterality Date   CYST REMOVAL NECK     DENTAL SURGERY     FEMORAL HERNIA REPAIR Left 07/17/2016   Procedure: LEFT FEMORAL  HERNIA REPAIR WITH MESH;  Surgeon: Donnie Mesa, MD;  Location: [place];  Service: General;  Laterality: Left;   GASTROJEJUNOSTOMY N/A 07/04/2020   Procedure: GASTROSTOMY TUBE;  Surgeon: Clovis Riley, MD;  Location: [place];  Service: General;  Laterality: N/A;   GASTROSTOMY N/A 03/27/2022   Procedure: INSERTION OF  GASTROSTOMY TUBE;  Surgeon: Clovis Riley, MD;  Location: [place];  Service: General;  Laterality: N/A;   HIATAL HERNIA REPAIR N/A 03/27/2022   Procedure: LAPAROSCOPIC REPAIR OF HIATAL HERNIA;  Surgeon: Clovis Riley, MD;  Location: [place];  Service: General;  Laterality: N/A;   LAPAROSCOPIC LYSIS OF ADHESIONS N/A 03/27/2022   Procedure: LAPAROSCOPIC LYSIS OF ADHESIONS;  Surgeon: Clovis Riley, MD;  Location: [place];  Service: General;  Laterality: N/A;   LAPAROTOMY N/A 07/04/2020   Procedure: EXPLORATORY LAPAROTOMY;  Surgeon: Clovis Riley, MD;  Location: [place];  Service: General;  Laterality: N/A;     Current Outpatient Medications  Medication Sig Dispense Refill   acetaminophen (TYLENOL) 325 MG tablet Take 2 tablets (650 mg total) by mouth every 6 (six) hours as needed for mild pain (or temp > 100).     amLODipine (NORVASC) 10 MG tablet TAKE 1 TABLET(10 MG) BY MOUTH DAILY (Patient taking differently: Take 10 mg by mouth at bedtime. TAKE 1 TABLET(10 MG) BY MOUTH DAILY) 90 tablet 1   amoxicillin-clavulanate (AUGMENTIN) 875-125 MG tablet Take 1 tablet by mouth 2 (two) times daily. 78 tablet 0   calcium carbonate (TUMS - DOSED IN MG ELEMENTAL CALCIUM) 500 MG chewable tablet Chew 1 tablet by mouth daily.     fluconazole (DIFLUCAN) 200 MG  tablet Take 2 tablets (400 mg total) by mouth daily. 78 tablet 0   LUMIGAN 0.01 % SOLN Place 1 drop into both eyes at bedtime.     polyethylene glycol powder (GLYCOLAX/MIRALAX) 17 GM/SCOOP powder Take 17 g by mouth daily as needed for mild constipation. 238 g 0   Sodium Chloride Flush (SALINE FLUSH) 0.9 % SOLN Flush drainage catheter with 5-10 ml once to two times daily. 300 mL 0   No current facility-administered medications for this visit.    Physical Exam BP 114/77 (BP Location: Left Arm, Patient Position: Sitting)   Pulse (!) 124   Resp 20   Ht 5' 3"$  (1.6 m)   Wt 114 lb (51.7 kg)   SpO2 97% Comment: RA  BMI 20.19 kg/m  Thin 72 year old woman  in no acute distress Alert and oriented x 3 with no focal deficits Trachea midline, no adenopathy Cardiac tachycardic, regular, no murmur Lungs absent breath sounds right base, otherwise clear Abdomen soft, incision healing well, tube in place 3+ edema right lower extremity, 2+ left lower extremity  Diagnostic Tests: CT ANGIOGRAPHY CHEST, ABDOMEN AND PELVIS   TECHNIQUE: Non-contrast CT of the chest was initially obtained.   Multidetector CT imaging through the chest, abdomen and pelvis was performed using the standard protocol during bolus administration of intravenous contrast. Multiplanar reconstructed images and MIPs were obtained and reviewed to evaluate the vascular anatomy.   RADIATION DOSE REDUCTION: This exam was performed according to the departmental dose-optimization program which includes automated exposure control, adjustment of the mA and/or kV according to patient size and/or use of iterative reconstruction technique.   CONTRAST:  29m OMNIPAQUE IOHEXOL 350 MG/ML SOLN   COMPARISON:  04/25/2022   FINDINGS: CTA CHEST FINDINGS   Cardiovascular: SVC patent. Heart size normal. Trace pericardial fluid. The RV is nondilated. There is good contrast opacification of the pulmonary arterial tree. Incompletely occlusive filling defect straddling bifurcation of left lower lobe pulmonary artery. This was present on 04/25/2022. No other definite filling defects are identified. Adequate contrast opacification of the thoracic aorta with no evidence of dissection, aneurysm, or stenosis. There is classic 3-vessel brachiocephalic arch anatomy without proximal stenosis.   Mediastinum/Nodes: No definite mass or adenopathy.   Lungs/Pleura: Resolution of left pleural effusion. There has been interval removal of the right subpulmonic pigtail drain catheter, and the previously identified medial fluid collection is indistinguishable from adjacent pleural effusion. Moderate  right pleural effusion, possibly loculated. No pneumothorax. Atelectasis/consolidation of right middle lobe and much of the right lower lobe save the superior segment. Some increase in atelectasis laterally in the left lower lobe.   Musculoskeletal: Spondylitic changes in the lower cervical and midthoracic spine. No acute findings.   Review of the MIP images confirms the above findings.   CTA ABDOMEN AND PELVIS FINDINGS   VASCULAR   Aorta: Normal caliber aorta without aneurysm, dissection, vasculitis or significant stenosis.   Celiac: Patent without evidence of aneurysm, dissection, vasculitis or significant stenosis.   SMA: Patent without evidence of aneurysm, dissection, vasculitis or significant stenosis.   Renals: Both renal arteries are patent without evidence of aneurysm, dissection, vasculitis, fibromuscular dysplasia or significant stenosis.   IMA: Patent without evidence of aneurysm, dissection, vasculitis or significant stenosis.   Inflow: Patent without evidence of aneurysm, dissection, vasculitis or significant stenosis.   Veins: Retroaortic left renal vein, an anatomic variant. No obvious venous abnormality within the limitations of this arterial phase study.   Review of the MIP images confirms the above  findings.   NON-VASCULAR   Hepatobiliary: Heterogenous arterial phase enhancement. Stable 1.4 cm benign cyst in hepatic segment 8. No new lesion or biliary ductal dilatation. Gallbladder nondistended.   Pancreas: Unremarkable. No pancreatic ductal dilatation or surrounding inflammatory changes.   Spleen: Normal in size without focal abnormality.   Adrenals/Urinary Tract: Adrenal glands are unremarkable. Kidneys are normal, without renal calculi, focal lesion, or hydronephrosis. Bladder is unremarkable.   Stomach/Bowel: Previous gastric surgery. Balloon retention gastrostomy catheter in the nondistended stomach. Small bowel is decompressed.  Appendix not discretely identified. The colon is incompletely distended, with scattered sigmoid diverticula suspected; no adjacent inflammatory change or abscess.   Lymphatic: No abdominal or pelvic adenopathy.   Reproductive: Calcified uterine fundal fibroids.  No adnexal mass.   Other: Bilateral pelvic phleboliths.  No ascites.  No free air.   Musculoskeletal: Interval removal of right transgluteal drain catheter, without residual or recurrent fluid collection.   Bilateral sacroiliitis. Degenerative disc disease L5-S1 with stable presumed Schmorl's node adjacent to the inferior endplate of L5, stable since 07/04/2020. No acute findings.   Review of the MIP images confirms the above findings.   IMPRESSION: 1. Chronic left lower lobe pulmonary embolus. No acute PE nor evidence of right heart strain. 2. Negative for acute aortic dissection or aneurysm. 3. Interval removal of right subpulmonic pigtail drain catheter, with persistent moderate right pleural effusion, possibly loculated. 4. Persistent right middle and lower lobe atelectasis/consolidation. 5. Sigmoid diverticulosis.     Electronically Signed   By: Lucrezia Europe M.D.   On: 04/28/2022 10:23   I personally reviewed the CT images.  Loculated right pleural effusion with atelectasis/consolidation of right lower and middle lobes.  Impression: Alyssa Peterson is a 72 year old woman with a history of hypertension, anemia, and gastroesophageal reflux.  She presented with an incarcerated paraesophageal hernia with gastric volvulus.  She underwent repair by Dr. Windle Guard of [facility].  Postoperatively she had a right pleural effusion.  Conservative management with pigtail drainage has been unsuccessful.  She has a persistent moderately large loculated right pleural effusion.  She has extensive compressive atelectasis of the right lower and middle lobes.  At this point I think her best option is VATS for decortication.  Complicating factor  is recently diagnosed pulmonary embolus.  Started on Eliquis.  Per radiology report this appeared to be chronic not acute.  No prior history, so likely occurred during her previous hospitalization.  Best option would probably be to place an IVC filter because she will have to be off of Eliquis for 2 days prior to surgery and then likely will need to be off it for several days afterwards.  I described the proposed operative procedure to her.  The plan would be to do a right VATS for drainage of the effusion and decortication of the lung.  I informed her of the general nature of the procedure including the need for general anesthesia, the incisions to be used, the use of drainage tubes postoperatively, the expected hospital stay, and the overall recovery.  I informed her of the indications, risks, benefits, and alternatives.  She understands the risks include, but not limited to death, MI, DVT, PE, bleeding, possible need for transfusion, prolonged air leak, cardiac arrhythmias, respiratory renal failure, as well as possibility of other unforeseeable complications.  She does wish to proceed with surgery.   Plan: Will consult vascular surgery for evaluation for IVC filter placement. Right VATS for drainage of pleural effusion and decortication Hold Eliquis 2 days prior  to surgery.  [name], MD [facility] [phone]

## 2022-04-29 NOTE — Progress Notes (Signed)
[facility] [place]  ASSESSMENT / PLAN: 72 y.o. female with history of DVT/PE on Eliquis. She is planned to undergo decortication with Dr. Roxan Hockey for loculated effusion.  Will plan to place an IVC filter in the Cath Lab soon as possible.  No need to hold Eliquis for IVC filter placement.   CHIEF COMPLAINT: History of DVT, planned thoracic surgery  HISTORY OF PRESENT ILLNESS: Alyssa Peterson is a 72 y.o. female referred to clinic for evaluation of possible IVC filter placement.  The patient is a very pleasant woman who underwent laparoscopic esophageal hernia repair.  This complicated by large pleural effusion which required tube thoracostomy.  The effusion reaccumulated, and is now loculated.  Dr. Roxan Hockey saw the patient earlier today, and recommended decortication.  The patient's history is also significant for pulmonary embolism, for which she is on Eliquis.  This was recently diagnosed, and the patient needs to complete a course of treatment before discontinuing her anticoagulation altogether.  For this reason the IVC filter was requested.  Past Medical History:  Diagnosis Date   Anemia    Diaphragmatic hernia with obstruction    Gastric volvulus    GERD (gastroesophageal reflux disease)    Hypertension    Inguinal hernia    left   Pulmonary embolism [facility])     Past Surgical History:  Procedure Laterality Date   CYST REMOVAL NECK     DENTAL SURGERY     FEMORAL HERNIA REPAIR Left 07/17/2016   Procedure: LEFT FEMORAL  HERNIA REPAIR WITH MESH;  Surgeon: Donnie Mesa, MD;  Location: [place];  Service: General;  Laterality: Left;   GASTROJEJUNOSTOMY N/A 07/04/2020   Procedure: GASTROSTOMY TUBE;  Surgeon: Clovis Riley, MD;  Location: [place];  Service: General;  Laterality: N/A;   GASTROSTOMY N/A 03/27/2022   Procedure: INSERTION OF GASTROSTOMY TUBE;  Surgeon: Clovis Riley, MD;  Location: [place];  Service: General;  Laterality: N/A;    HIATAL HERNIA REPAIR N/A 03/27/2022   Procedure: LAPAROSCOPIC REPAIR OF HIATAL HERNIA;  Surgeon: Clovis Riley, MD;  Location: [place];  Service: General;  Laterality: N/A;   LAPAROSCOPIC LYSIS OF ADHESIONS N/A 03/27/2022   Procedure: LAPAROSCOPIC LYSIS OF ADHESIONS;  Surgeon: Clovis Riley, MD;  Location: [place];  Service: General;  Laterality: N/A;   LAPAROTOMY N/A 07/04/2020   Procedure: EXPLORATORY LAPAROTOMY;  Surgeon: Clovis Riley, MD;  Location: [place];  Service: General;  Laterality: N/A;    History reviewed. No pertinent family history.  Social History   Socioeconomic History   Marital status: Divorced    Spouse name: Not on file   Number of children: Not on file   Years of education: Not on file   Highest education level: Not on file  Occupational History   Not on file  Tobacco Use   Smoking status: Never   Smokeless tobacco: Never  Vaping Use   Vaping Use: Never used  Substance and Sexual Activity   Alcohol use: No    Alcohol/week: 0.0 standard drinks of alcohol   Drug use: No   Sexual activity: Not on file  Other Topics Concern   Not on file  Social History Narrative   Not on file   Social Determinants of Health   Financial Resource Strain: Not on file  Food Insecurity: No Food Insecurity (03/24/2022)   Hunger Vital Sign    Worried About Running Out of Food in the Last Year: Never true  Ran Out of Food in the Last Year: Never true  Transportation Needs: No Transportation Needs (03/24/2022)   PRAPARE - Hydrologist (Medical): No    Lack of Transportation (Non-Medical): No  Physical Activity: Not on file  Stress: Not on file  Social Connections: Not on file  Intimate Partner Violence: Not At Risk (03/24/2022)   Humiliation, Afraid, Rape, and Kick questionnaire    Fear of Current or Ex-Partner: No    Emotionally Abused: No    Physically Abused: No    Sexually Abused: No    Allergies  Allergen Reactions   Latex Itching     hands    Current Outpatient Medications  Medication Sig Dispense Refill   acetaminophen (TYLENOL) 325 MG tablet Take 2 tablets (650 mg total) by mouth every 6 (six) hours as needed for mild pain (or temp > 100).     amLODipine (NORVASC) 10 MG tablet TAKE 1 TABLET(10 MG) BY MOUTH DAILY (Patient taking differently: Take 10 mg by mouth at bedtime. TAKE 1 TABLET(10 MG) BY MOUTH DAILY) 90 tablet 1   amoxicillin-clavulanate (AUGMENTIN) 875-125 MG tablet Take 1 tablet by mouth 2 (two) times daily. 78 tablet 0   calcium carbonate (TUMS - DOSED IN MG ELEMENTAL CALCIUM) 500 MG chewable tablet Chew 1 tablet by mouth daily.     fluconazole (DIFLUCAN) 200 MG tablet Take 2 tablets (400 mg total) by mouth daily. 78 tablet 0   LUMIGAN 0.01 % SOLN Place 1 drop into both eyes at bedtime.     polyethylene glycol powder (GLYCOLAX/MIRALAX) 17 GM/SCOOP powder Take 17 g by mouth daily as needed for mild constipation. 238 g 0   Sodium Chloride Flush (SALINE FLUSH) 0.9 % SOLN Flush drainage catheter with 5-10 ml once to two times daily. 300 mL 0   No current facility-administered medications for this visit.    PHYSICAL EXAM Vitals:   04/29/22 1504  BP: 105/74  Pulse: (!) 110  Resp: 20  Temp: 98.9 F (37.2 C)  SpO2: 95%  Weight: 114 lb (51.7 kg)  Height: 5' 3"$  (1.6 m)   Thin woman in no acute distress Regular rate and rhythm Unlabored breathing  PERTINENT LABORATORY AND RADIOLOGIC DATA  Most recent CBC    Latest Ref Rng & Units 04/09/2022    9:04 AM 04/07/2022   12:38 PM 04/06/2022    4:53 AM  CBC  WBC 4.0 - 10.5 K/uL 9.5  14.9  16.5   Hemoglobin 12.0 - 15.0 g/dL 11.2  10.6  10.6   Hematocrit 36.0 - 46.0 % 33.8  32.6  32.3   Platelets 150 - 400 K/uL 408  409  353      Most recent CMP    Latest Ref Rng & Units 04/11/2022    3:40 AM 04/09/2022    9:04 AM 04/04/2022    2:30 AM  CMP  Glucose 70 - 99 mg/dL 115  112  140   BUN 8 - 23 mg/dL 10  11  10   $ Creatinine 0.44 - 1.00 mg/dL 0.86  0.66   0.63   Sodium 135 - 145 mmol/L 138  137  134   Potassium 3.5 - 5.1 mmol/L 3.6  3.4  3.9   Chloride 98 - 111 mmol/L 105  106  103   CO2 22 - 32 mmol/L 25  25  25   $ Calcium 8.9 - 10.3 mg/dL 8.6  8.5  8.2     Renal function Estimated  Creatinine Clearance: 50.4 mL/min (by C-G formula based on SCr of 0.86 mg/dL).  Hgb [name] MFr Bld (%)  Date Value  01/07/2022 5.9 (H)    LDL Calculated  Date Value Ref Range Status  04/16/2016 43 0 - 99 mg/dL Final     Yevonne Aline. Stanford Breed, MD FACS [facility] Phone Number: [phone] 04/29/2022 9:46 PM   Total time spent on preparing this encounter including chart review, data review, collecting history, examining the patient, coordinating care for this new patient, 45 minutes.  Portions of this report may have been transcribed using voice recognition software.  Every effort has been made to ensure accuracy; however, inadvertent computerized transcription errors may still be present.

## 2022-04-29 NOTE — Progress Notes (Signed)
[address]       [place], [address]             [phone]     HPI: Alyssa Peterson returns for a follow-up of a right pleural effusion after repair of an incarcerated diaphragmatic hernia.  Alyssa Peterson is a 72 year old woman with a history of hypertension, anemia, and gastroesophageal reflux.  She presented on New Year's Day with abdominal pain.  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  She underwent laparoscopic repair by Dr. Windle Guard.  During her postoperative follow-up she was noted to have a large right pleural effusion as well as fluid within the previous hernia sac.  A pigtail catheter was placed.  She had a repeat CT on 04/25/2022.  Her pigtail was not draining any significant amount.  It was removed.  There was still a loculated effusion.  She underwent thoracentesis which drained 1.2 L of fluid.  Repeat CT on 04/28/2022 showed persistent loculated pleural effusion with compressive atelectasis of the right lower lobe.  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  She gets short of breath with even relatively light activities.  Not short of breath at rest.  She is eating well.  Past Medical History:  Diagnosis Date   Anemia    Diaphragmatic hernia with obstruction    Gastric volvulus    GERD (gastroesophageal reflux disease)    Hypertension    Inguinal hernia    left   Pulmonary embolism ([place])    Past Surgical History:  Procedure Laterality Date   CYST REMOVAL NECK     DENTAL SURGERY     FEMORAL HERNIA REPAIR Left 07/17/2016   Procedure: LEFT FEMORAL  HERNIA REPAIR WITH MESH;  Surgeon: Donnie Mesa, MD;  Location: [place];  Service: General;  Laterality: Left;   GASTROJEJUNOSTOMY N/A 07/04/2020   Procedure: GASTROSTOMY TUBE;  Surgeon: Clovis Riley, MD;  Location: [place];  Service: General;  Laterality: N/A;   GASTROSTOMY N/A 03/27/2022   Procedure: INSERTION OF  GASTROSTOMY TUBE;  Surgeon: Clovis Riley, MD;  Location: [place];  Service: General;  Laterality: N/A;   HIATAL HERNIA REPAIR N/A 03/27/2022   Procedure: LAPAROSCOPIC REPAIR OF HIATAL HERNIA;  Surgeon: Clovis Riley, MD;  Location: [place];  Service: General;  Laterality: N/A;   LAPAROSCOPIC LYSIS OF ADHESIONS N/A 03/27/2022   Procedure: LAPAROSCOPIC LYSIS OF ADHESIONS;  Surgeon: Clovis Riley, MD;  Location: [place];  Service: General;  Laterality: N/A;   LAPAROTOMY N/A 07/04/2020   Procedure: EXPLORATORY LAPAROTOMY;  Surgeon: Clovis Riley, MD;  Location: [place];  Service: General;  Laterality: N/A;     Current Outpatient Medications  Medication Sig Dispense Refill   acetaminophen (TYLENOL) 325 MG tablet Take 2 tablets (650 mg total) by mouth every 6 (six) hours as needed for mild pain (or temp > 100).     amLODipine (NORVASC) 10 MG tablet TAKE 1 TABLET(10 MG) BY MOUTH DAILY (Patient taking differently: Take 10 mg by mouth at bedtime. TAKE 1 TABLET(10 MG) BY MOUTH DAILY) 90 tablet 1   amoxicillin-clavulanate (AUGMENTIN) 875-125 MG tablet Take 1 tablet by mouth 2 (two) times daily. 78 tablet 0   calcium carbonate (TUMS - DOSED IN MG ELEMENTAL CALCIUM) 500 MG chewable tablet Chew 1 tablet by mouth daily.     fluconazole (DIFLUCAN) 200 MG  tablet Take 2 tablets (400 mg total) by mouth daily. 78 tablet 0   LUMIGAN 0.01 % SOLN Place 1 drop into both eyes at bedtime.     polyethylene glycol powder (GLYCOLAX/MIRALAX) 17 GM/SCOOP powder Take 17 g by mouth daily as needed for mild constipation. 238 g 0   Sodium Chloride Flush (SALINE FLUSH) 0.9 % SOLN Flush drainage catheter with 5-10 ml once to two times daily. 300 mL 0   No current facility-administered medications for this visit.    Physical Exam BP 114/77 (BP Location: Left Arm, Patient Position: Sitting)   Pulse (!) 124   Resp 20   Ht 5' 3"$  (1.6 m)   Wt 114 lb (51.7 kg)   SpO2 97% Comment: RA  BMI 20.19 kg/m  Thin 72 year old woman  in no acute distress Alert and oriented x 3 with no focal deficits Trachea midline, no adenopathy Cardiac tachycardic, regular, no murmur Lungs absent breath sounds right base, otherwise clear Abdomen soft, incision healing well, tube in place 3+ edema right lower extremity, 2+ left lower extremity  Diagnostic Tests: CT ANGIOGRAPHY CHEST, ABDOMEN AND PELVIS   TECHNIQUE: Non-contrast CT of the chest was initially obtained.   Multidetector CT imaging through the chest, abdomen and pelvis was performed using the standard protocol during bolus administration of intravenous contrast. Multiplanar reconstructed images and MIPs were obtained and reviewed to evaluate the vascular anatomy.   RADIATION DOSE REDUCTION: This exam was performed according to the departmental dose-optimization program which includes automated exposure control, adjustment of the mA and/or kV according to patient size and/or use of iterative reconstruction technique.   CONTRAST:  29m OMNIPAQUE IOHEXOL 350 MG/ML SOLN   COMPARISON:  04/25/2022   FINDINGS: CTA CHEST FINDINGS   Cardiovascular: SVC patent. Heart size normal. Trace pericardial fluid. The RV is nondilated. There is good contrast opacification of the pulmonary arterial tree. Incompletely occlusive filling defect straddling bifurcation of left lower lobe pulmonary artery. This was present on 04/25/2022. No other definite filling defects are identified. Adequate contrast opacification of the thoracic aorta with no evidence of dissection, aneurysm, or stenosis. There is classic 3-vessel brachiocephalic arch anatomy without proximal stenosis.   Mediastinum/Nodes: No definite mass or adenopathy.   Lungs/Pleura: Resolution of left pleural effusion. There has been interval removal of the right subpulmonic pigtail drain catheter, and the previously identified medial fluid collection is indistinguishable from adjacent pleural effusion. Moderate  right pleural effusion, possibly loculated. No pneumothorax. Atelectasis/consolidation of right middle lobe and much of the right lower lobe save the superior segment. Some increase in atelectasis laterally in the left lower lobe.   Musculoskeletal: Spondylitic changes in the lower cervical and midthoracic spine. No acute findings.   Review of the MIP images confirms the above findings.   CTA ABDOMEN AND PELVIS FINDINGS   VASCULAR   Aorta: Normal caliber aorta without aneurysm, dissection, vasculitis or significant stenosis.   Celiac: Patent without evidence of aneurysm, dissection, vasculitis or significant stenosis.   SMA: Patent without evidence of aneurysm, dissection, vasculitis or significant stenosis.   Renals: Both renal arteries are patent without evidence of aneurysm, dissection, vasculitis, fibromuscular dysplasia or significant stenosis.   IMA: Patent without evidence of aneurysm, dissection, vasculitis or significant stenosis.   Inflow: Patent without evidence of aneurysm, dissection, vasculitis or significant stenosis.   Veins: Retroaortic left renal vein, an anatomic variant. No obvious venous abnormality within the limitations of this arterial phase study.   Review of the MIP images confirms the above  findings.   NON-VASCULAR   Hepatobiliary: Heterogenous arterial phase enhancement. Stable 1.4 cm benign cyst in hepatic segment 8. No new lesion or biliary ductal dilatation. Gallbladder nondistended.   Pancreas: Unremarkable. No pancreatic ductal dilatation or surrounding inflammatory changes.   Spleen: Normal in size without focal abnormality.   Adrenals/Urinary Tract: Adrenal glands are unremarkable. Kidneys are normal, without renal calculi, focal lesion, or hydronephrosis. Bladder is unremarkable.   Stomach/Bowel: Previous gastric surgery. Balloon retention gastrostomy catheter in the nondistended stomach. Small bowel is decompressed.  Appendix not discretely identified. The colon is incompletely distended, with scattered sigmoid diverticula suspected; no adjacent inflammatory change or abscess.   Lymphatic: No abdominal or pelvic adenopathy.   Reproductive: Calcified uterine fundal fibroids.  No adnexal mass.   Other: Bilateral pelvic phleboliths.  No ascites.  No free air.   Musculoskeletal: Interval removal of right transgluteal drain catheter, without residual or recurrent fluid collection.   Bilateral sacroiliitis. Degenerative disc disease L5-S1 with stable presumed Schmorl's node adjacent to the inferior endplate of L5, stable since 07/04/2020. No acute findings.   Review of the MIP images confirms the above findings.   IMPRESSION: 1. Chronic left lower lobe pulmonary embolus. No acute PE nor evidence of right heart strain. 2. Negative for acute aortic dissection or aneurysm. 3. Interval removal of right subpulmonic pigtail drain catheter, with persistent moderate right pleural effusion, possibly loculated. 4. Persistent right middle and lower lobe atelectasis/consolidation. 5. Sigmoid diverticulosis.     Electronically Signed   By: Lucrezia Europe M.D.   On: 04/28/2022 10:23   I personally reviewed the CT images.  Loculated right pleural effusion with atelectasis/consolidation of right lower and middle lobes.  Impression: Alyssa Peterson is a 72 year old woman with a history of hypertension, anemia, and gastroesophageal reflux.  She presented with an incarcerated paraesophageal hernia with gastric volvulus.  She underwent repair by Dr. Windle Guard of [facility].  Postoperatively she had a right pleural effusion.  Conservative management with pigtail drainage has been unsuccessful.  She has a persistent moderately large loculated right pleural effusion.  She has extensive compressive atelectasis of the right lower and middle lobes.  At this point I think her best option is VATS for decortication.  Complicating factor  is recently diagnosed pulmonary embolus.  Started on Eliquis.  Per radiology report this appeared to be chronic not acute.  No prior history, so likely occurred during her previous hospitalization.  Best option would probably be to place an IVC filter because she will have to be off of Eliquis for 2 days prior to surgery and then likely will need to be off it for several days afterwards.  I described the proposed operative procedure to her.  The plan would be to do a right VATS for drainage of the effusion and decortication of the lung.  I informed her of the general nature of the procedure including the need for general anesthesia, the incisions to be used, the use of drainage tubes postoperatively, the expected hospital stay, and the overall recovery.  I informed her of the indications, risks, benefits, and alternatives.  She understands the risks include, but not limited to death, MI, DVT, PE, bleeding, possible need for transfusion, prolonged air leak, cardiac arrhythmias, respiratory renal failure, as well as possibility of other unforeseeable complications.  She does wish to proceed with surgery.   Plan: Will consult vascular surgery for evaluation for IVC filter placement. Right VATS for drainage of pleural effusion and decortication Hold Eliquis 2 days prior  to surgery.  [name], MD [facility] [phone]

## 2022-04-30 ENCOUNTER — Inpatient Hospital Stay: Payer: Medicare Other | Admitting: Internal Medicine

## 2022-04-30 ENCOUNTER — Other Ambulatory Visit: Payer: Self-pay | Admitting: *Deleted

## 2022-04-30 ENCOUNTER — Ambulatory Visit: Payer: Medicare Other | Admitting: Thoracic Surgery (Cardiothoracic Vascular Surgery)

## 2022-04-30 ENCOUNTER — Encounter: Payer: Self-pay | Admitting: *Deleted

## 2022-04-30 DIAGNOSIS — J90 Pleural effusion, not elsewhere classified: Secondary | ICD-10-CM

## 2022-05-01 ENCOUNTER — Encounter: Payer: Self-pay | Admitting: Internal Medicine

## 2022-05-01 ENCOUNTER — Ambulatory Visit: Payer: Medicare Other | Admitting: Internal Medicine

## 2022-05-01 ENCOUNTER — Other Ambulatory Visit: Payer: Self-pay

## 2022-05-01 VITALS — BP 120/86 | HR 108 | Temp 97.6°F | Ht 63.0 in | Wt 113.0 lb

## 2022-05-01 DIAGNOSIS — J869 Pyothorax without fistula: Secondary | ICD-10-CM

## 2022-05-01 DIAGNOSIS — Z9181 History of falling: Secondary | ICD-10-CM | POA: Diagnosis not present

## 2022-05-01 DIAGNOSIS — Z792 Long term (current) use of antibiotics: Secondary | ICD-10-CM | POA: Diagnosis not present

## 2022-05-01 DIAGNOSIS — B3789 Other sites of candidiasis: Secondary | ICD-10-CM | POA: Diagnosis not present

## 2022-05-01 DIAGNOSIS — T8143XA Infection following a procedure, organ and space surgical site, initial encounter: Secondary | ICD-10-CM | POA: Diagnosis not present

## 2022-05-01 DIAGNOSIS — Z0000 Encounter for general adult medical examination without abnormal findings: Secondary | ICD-10-CM

## 2022-05-01 DIAGNOSIS — Z4803 Encounter for change or removal of drains: Secondary | ICD-10-CM | POA: Diagnosis not present

## 2022-05-01 DIAGNOSIS — J9811 Atelectasis: Secondary | ICD-10-CM | POA: Diagnosis not present

## 2022-05-01 DIAGNOSIS — D630 Anemia in neoplastic disease: Secondary | ICD-10-CM | POA: Diagnosis not present

## 2022-05-01 DIAGNOSIS — E43 Unspecified severe protein-calorie malnutrition: Secondary | ICD-10-CM | POA: Diagnosis not present

## 2022-05-01 DIAGNOSIS — J90 Pleural effusion, not elsewhere classified: Secondary | ICD-10-CM | POA: Diagnosis not present

## 2022-05-01 DIAGNOSIS — K651 Peritoneal abscess: Secondary | ICD-10-CM | POA: Diagnosis not present

## 2022-05-01 DIAGNOSIS — K219 Gastro-esophageal reflux disease without esophagitis: Secondary | ICD-10-CM | POA: Diagnosis not present

## 2022-05-01 DIAGNOSIS — Z431 Encounter for attention to gastrostomy: Secondary | ICD-10-CM | POA: Diagnosis not present

## 2022-05-01 DIAGNOSIS — I10 Essential (primary) hypertension: Secondary | ICD-10-CM | POA: Diagnosis not present

## 2022-05-01 NOTE — Patient Instructions (Signed)
You'll have surgery to address your right side surrounding lung abscess on 2/14  Continue the antibiotics amox-clavulonate and fluconazole until at least 1 week beyond surgery   Please see me first week of march 2024. If somehow you remain in the hospital around this time still, please ask for the inpatient infectious disease team to see and assess for further antibiotics need

## 2022-05-01 NOTE — Progress Notes (Signed)
[facility] for Infectious Disease  Patient Active Problem List   Diagnosis Date Noted   Pulmonary embolus ([place]) 04/29/2022   Intra-abdominal abscess ([place]) 04/08/2022   Protein-calorie malnutrition, severe 04/03/2022   Hiatal hernia 03/24/2022   Essential hypertension 11/21/2020   Perforated abdominal viscus 07/04/2020   Family history of colonic polyps 04/04/2016      Subjective:    Patient ID: Alyssa Peterson, female    DOB: [birth year], 72 y.o.   MRN: [id number]  Cc - f/u intraabd and thoracic abscess  HPI:  Alyssa Peterson is a 72 y.o. female here for hospital f/u of above problem  Admitted 03/24/2022 to [facility] for hernia repair (hiatal hernia) 10/30/26 Course complicated by gastric nicking and intraabd/thoracic abscess Ir drain placement 1/12 pelvic abscess -- cx (in setting of almost a week empiric abx cefepime/flagyl) grew candida albican Ir place thoracic abscess drain 1/16 and cx ngtd  She has pcn allergy 40 years ago and doesn't appear anaphylaxis. Tolerated amox oral challenge  Discharge on amox-clav and fluconazole  --------- 05/01/22 id clinic f/u She has had ir f/u 04/25/22. Both thoracic/abd abscesses were smaller (subpulm size 4 cm and pelv size 2 cm). Incidental left lower lobe PE was found and she was started on eliquis. Also has 1.2 liters right thoracentesis performed by IR -- the right chest tube was removed She has pelv abscess drain study --> no fistula in the pelvic abscess so that was removed   She only has the abd drain (surgical drain) left now  Saw ct surgery on 04/29/22. Had another repat chest ct 2/5 that showed loculated effusion with compressive atelectasis right lobe. Due to this and dyspnea as mentioned below, VATS is planned for 2/14  She had short of breath with deep catching felt like its coming on the right lung. No pain though right side like before. She thinks her activity tolerance is improving  She is still taking  fluconazole and amox-clav No n/v/d No rash  Appetite is "pretty good"      Allergies  Allergen Reactions   Latex Itching    hands      Outpatient Medications Prior to Visit  Medication Sig Dispense Refill   acetaminophen (TYLENOL) 325 MG tablet Take 2 tablets (650 mg total) by mouth every 6 (six) hours as needed for mild pain (or temp > 100).     amLODipine (NORVASC) 10 MG tablet TAKE 1 TABLET(10 MG) BY MOUTH DAILY (Patient taking differently: Take 10 mg by mouth at bedtime.) 90 tablet 1   amoxicillin-clavulanate (AUGMENTIN) 875-125 MG tablet Take 1 tablet by mouth 2 (two) times daily. 78 tablet 0   apixaban (ELIQUIS) 5 MG TABS tablet Take 5 mg by mouth 2 (two) times daily.     calcium carbonate (TUMS - DOSED IN MG ELEMENTAL CALCIUM) 500 MG chewable tablet Chew 1 tablet by mouth daily as needed (Upset stomach).     fluconazole (DIFLUCAN) 200 MG tablet Take 2 tablets (400 mg total) by mouth daily. 78 tablet 0   LUMIGAN 0.01 % SOLN Place 1 drop into both eyes at bedtime.     polyethylene glycol powder (GLYCOLAX/MIRALAX) 17 GM/SCOOP powder Take 17 g by mouth daily as needed for mild constipation. 238 g 0   Sodium Chloride Flush (SALINE FLUSH) 0.9 % SOLN Flush drainage catheter with 5-10 ml once to two times daily. 300 mL 0   No facility-administered medications prior to visit.  Social History   Socioeconomic History   Marital status: Divorced    Spouse name: Not on file   Number of children: Not on file   Years of education: Not on file   Highest education level: Not on file  Occupational History   Not on file  Tobacco Use   Smoking status: Never   Smokeless tobacco: Never  Vaping Use   Vaping Use: Never used  Substance and Sexual Activity   Alcohol use: No    Alcohol/week: 0.0 standard drinks of alcohol   Drug use: No   Sexual activity: Not on file  Other Topics Concern   Not on file  Social History Narrative   Not on file   Social Determinants of Health    Financial Resource Strain: Not on file  Food Insecurity: No Food Insecurity (03/24/2022)   Hunger Vital Sign    Worried About Running Out of Food in the Last Year: Never true    Ran Out of Food in the Last Year: Never true  Transportation Needs: No Transportation Needs (03/24/2022)   PRAPARE - Hydrologist (Medical): No    Lack of Transportation (Non-Medical): No  Physical Activity: Not on file  Stress: Not on file  Social Connections: Not on file  Intimate Partner Violence: Not At Risk (03/24/2022)   Humiliation, Afraid, Rape, and Kick questionnaire    Fear of Current or Ex-Partner: No    Emotionally Abused: No    Physically Abused: No    Sexually Abused: No      Review of Systems     Objective:    Ht 5\' 3"  (1.6 m)   Wt 113 lb (51.3 kg)   BMI 20.02 kg/m  Nursing note and vital signs reviewed.  Physical Exam     General/constitutional: no distress, pleasant HEENT: Normocephalic, PER, Conj Clear, EOMI, Oropharynx clear Neck supple CV: rrr no mrg Lungs: clear to auscultation with decreased breath sound right posterior lower lung, normal respiratory effort Abd: Soft, Nontender -- abd perc drain remains/capped Ext: no edema Skin: No Rash Neuro: nonfocal MSK: no peripheral joint swelling/tenderness/warmth; back spines nontender   Labs: Lab Results  Component Value Date   WBC 9.5 04/09/2022   HGB 11.2 (L) 04/09/2022   HCT 33.8 (L) 04/09/2022   MCV 87.8 04/09/2022   PLT 408 (H) 76/73/4193   Last metabolic panel Lab Results  Component Value Date   GLUCOSE 115 (H) 04/11/2022   NA 138 04/11/2022   K 3.6 04/11/2022   CL 105 04/11/2022   CO2 25 04/11/2022   BUN 10 04/11/2022   CREATININE 0.86 04/11/2022   GFRNONAA >60 04/11/2022   CALCIUM 8.6 (L) 04/11/2022   PHOS 3.1 04/04/2022   PROT 5.0 (L) 04/03/2022   ALBUMIN 1.8 (L) 04/03/2022   LABGLOB 3.0 01/07/2022   AGRATIO 1.3 01/07/2022   BILITOT 0.9 04/03/2022   ALKPHOS 73  04/03/2022   AST 19 04/03/2022   ALT 19 04/03/2022   ANIONGAP 8 04/11/2022    Micro:  Serology:  Imaging: Reviewed  04/28/22 chest/abd/pelv cta 1. Chronic left lower lobe pulmonary embolus. No acute PE nor evidence of right heart strain. 2. Negative for acute aortic dissection or aneurysm. 3. Interval removal of right subpulmonic pigtail drain catheter, with persistent moderate right pleural effusion, possibly loculated. 4. Persistent right middle and lower lobe atelectasis/consolidation. 5. Sigmoid diverticulosis.   Assessment & Plan:   Problem List Items Addressed This Visit  Other   Intra-abdominal abscess ([place]) - Primary   Other Visit Diagnoses     Thoracic abscess ([place])       Empyema of pleural space without fistula ([place])       PE (physical exam), annual                                    Abx: 1/18-c amox-clav 1/16-c fluconazole 400  1/16-18 flagyl 1/16-18 ceftriaxone   ASSESSMENT: 72 yo female hiatal hernia s/p repair 04/25/00, complicated by gastric nicking and intraabd/thoracic abscess   Ir drain placement 1/12 pelvic abscess -- cx (in setting of almost a week empiric abx cefepime/flagyl) grew candida Ir place thoracic abscess drain 1/16 and cx in progress   Abx restarted with ceftriaxone/flagyl/fluconazole   Tolerated amox challenge; removed pcn allergy label. Discharged on oral amox-clav/fluc  ------ 05/01/22 assessment Since discharged has PE, and persistent symptomatic right empyema/thoracic abscess with compressive atelectasis. Right chest tube removed 2/02 prior to another repeat ct 2/5 Pelvic abscess had decreased significantly and drain removed 04/25/22  CTS to do VATS 05/07/2022  Continue amox-clav and fluconazole until 1 week after vats done (if clinically continues to improve) See me first week of march 2024.   If staying in hospital longer than march, ID team inpatient can be consulted   I have spent a total of 40 minutes of  face-to-face and non-face-to-face time, excluding clinical staff time, preparing to see patient, ordering tests and/or medications, and provide counseling the patient   Follow-up: Return in about 4 weeks (around 05/29/2022).      Jabier Mutton, [facility] 05/01/2022, 9:37 AM

## 2022-05-02 ENCOUNTER — Ambulatory Visit (HOSPITAL_COMMUNITY)
Admission: RE | Admit: 2022-05-02 | Discharge: 2022-05-02 | Disposition: A | Discharge status: Home / Self Care | Payer: Medicare Other | Attending: Vascular Surgery | Admitting: Vascular Surgery

## 2022-05-02 ENCOUNTER — Encounter (HOSPITAL_COMMUNITY)
Admission: RE | Disposition: A | Discharge status: Home / Self Care | Payer: Self-pay | Source: Home / Self Care | Attending: Vascular Surgery

## 2022-05-02 ENCOUNTER — Other Ambulatory Visit: Payer: Self-pay

## 2022-05-02 DIAGNOSIS — Z86718 Personal history of other venous thrombosis and embolism: Secondary | ICD-10-CM | POA: Insufficient documentation

## 2022-05-02 DIAGNOSIS — Z408 Encounter for other prophylactic surgery: Secondary | ICD-10-CM | POA: Insufficient documentation

## 2022-05-02 DIAGNOSIS — Z86711 Personal history of pulmonary embolism: Secondary | ICD-10-CM | POA: Diagnosis not present

## 2022-05-02 DIAGNOSIS — Z7901 Long term (current) use of anticoagulants: Secondary | ICD-10-CM | POA: Diagnosis not present

## 2022-05-02 DIAGNOSIS — J90 Pleural effusion, not elsewhere classified: Secondary | ICD-10-CM | POA: Diagnosis not present

## 2022-05-02 DIAGNOSIS — I2699 Other pulmonary embolism without acute cor pulmonale: Secondary | ICD-10-CM

## 2022-05-02 HISTORY — PX: IVC VENOGRAPHY: CATH118301

## 2022-05-02 HISTORY — PX: IVC FILTER INSERTION: CATH118245

## 2022-05-02 LAB — POCT I-STAT, CHEM 8
BUN: 6 mg/dL — ABNORMAL LOW (ref 8–23)
Calcium, Ion: 1.03 mmol/L — ABNORMAL LOW (ref 1.15–1.40)
Chloride: 101 mmol/L (ref 98–111)
Creatinine, Ser: 0.5 mg/dL (ref 0.44–1.00)
Glucose, Bld: 107 mg/dL — ABNORMAL HIGH (ref 70–99)
HCT: 35 % — ABNORMAL LOW (ref 36.0–46.0)
Hemoglobin: 11.9 g/dL — ABNORMAL LOW (ref 12.0–15.0)
Potassium: 3.1 mmol/L — ABNORMAL LOW (ref 3.5–5.1)
Sodium: 138 mmol/L (ref 135–145)
TCO2: 29 mmol/L (ref 22–32)

## 2022-05-02 SURGERY — IVC FILTER INSERTION
Anesthesia: LOCAL

## 2022-05-02 MED ORDER — ONDANSETRON HCL 4 MG/2ML IJ SOLN: 4.0000 mg | Freq: Four times a day (QID) | INTRAMUSCULAR | Status: DC | PRN

## 2022-05-02 MED ORDER — LIDOCAINE HCL (PF) 1 % IJ SOLN
INTRAMUSCULAR | Status: AC
  Filled 2022-05-02: qty 30

## 2022-05-02 MED ORDER — MIDAZOLAM HCL 2 MG/2ML IJ SOLN
INTRAMUSCULAR | Status: DC | PRN
  Administered 2022-05-02: 1 mg via INTRAVENOUS

## 2022-05-02 MED ORDER — LIDOCAINE HCL (PF) 1 % IJ SOLN
INTRAMUSCULAR | Status: DC | PRN
  Administered 2022-05-02: 15 mL

## 2022-05-02 MED ORDER — SODIUM CHLORIDE 0.9% FLUSH: 3.0000 mL | Freq: Two times a day (BID) | INTRAVENOUS | Status: DC

## 2022-05-02 MED ORDER — SODIUM CHLORIDE 0.9 % WEIGHT BASED INFUSION: 1.0000 mL/kg/h | INTRAVENOUS | Status: DC

## 2022-05-02 MED ORDER — FENTANYL CITRATE (PF) 100 MCG/2ML IJ SOLN
INTRAMUSCULAR | Status: AC
  Filled 2022-05-02: qty 2

## 2022-05-02 MED ORDER — LABETALOL HCL 5 MG/ML IV SOLN: 10.0000 mg | INTRAVENOUS | Status: DC | PRN

## 2022-05-02 MED ORDER — SODIUM CHLORIDE 0.9 % IV SOLN: 250.0000 mL | INTRAVENOUS | Status: DC | PRN

## 2022-05-02 MED ORDER — ACETAMINOPHEN 325 MG PO TABS: 650.0000 mg | ORAL_TABLET | ORAL | Status: DC | PRN

## 2022-05-02 MED ORDER — FENTANYL CITRATE (PF) 100 MCG/2ML IJ SOLN
INTRAMUSCULAR | Status: DC | PRN
  Administered 2022-05-02: 25 ug via INTRAVENOUS

## 2022-05-02 MED ORDER — MIDAZOLAM HCL 5 MG/5ML IJ SOLN
INTRAMUSCULAR | Status: AC
  Filled 2022-05-02: qty 5

## 2022-05-02 MED ORDER — HEPARIN (PORCINE) IN NACL 1000-0.9 UT/500ML-% IV SOLN
INTRAVENOUS | Status: AC
  Filled 2022-05-02: qty 500

## 2022-05-02 MED ORDER — SODIUM CHLORIDE 0.9% FLUSH: 3.0000 mL | INTRAVENOUS | Status: DC | PRN

## 2022-05-02 MED ORDER — HYDRALAZINE HCL 20 MG/ML IJ SOLN: 5.0000 mg | INTRAMUSCULAR | Status: DC | PRN

## 2022-05-02 MED ORDER — SODIUM CHLORIDE 0.9 % IV SOLN: INTRAVENOUS | Status: DC

## 2022-05-02 MED ORDER — HEPARIN (PORCINE) IN NACL 1000-0.9 UT/500ML-% IV SOLN
INTRAVENOUS | Status: DC | PRN
  Administered 2022-05-02: 500 mL

## 2022-05-02 SURGICAL SUPPLY — 13 items
FILTER VC CELECT-FEMORAL (Filter) IMPLANT
KIT MICROPUNCTURE NIT STIFF (SHEATH) IMPLANT
PROTECTION STATION PRESSURIZED (MISCELLANEOUS) ×1
SHEATH PROBE COVER 6X72 (BAG) IMPLANT
STATION PROTECTION PRESSURIZED (MISCELLANEOUS) IMPLANT
STOPCOCK MORSE 400PSI 3WAY (MISCELLANEOUS) IMPLANT
SYR MEDRAD MARK 7 150ML (SYRINGE) ×1 IMPLANT
TRANSDUCER W/STOPCOCK (MISCELLANEOUS) ×1 IMPLANT
TRAY PV CATH (CUSTOM PROCEDURE TRAY) ×1 IMPLANT
TUBING CIL FLEX 10 FLL-RA (TUBING) IMPLANT
TUBING CONTRAST HIGH PRESS 20 (MISCELLANEOUS) IMPLANT
WIRE BENTSON .035X145CM (WIRE) IMPLANT
WIRE HITORQ VERSACORE ST 145CM (WIRE) IMPLANT

## 2022-05-02 NOTE — Interval H&P Note (Signed)
History and Physical Interval Note:  05/02/2022 1:13 PM  Alyssa Peterson  has presented today for surgery, with the diagnosis of pulmonary embolism.  The various methods of treatment have been discussed with the patient and family. After consideration of risks, benefits and other options for treatment, the patient has consented to  Procedure(s): IVC FILTER INSERTION (N/A) IVC Venography (N/A) as a surgical intervention.  The patient's history has been reviewed, patient examined, no change in status, stable for surgery.  I have reviewed the patient's chart and labs.  Questions were answered to the patient's satisfaction.     Cherre Robins

## 2022-05-02 NOTE — Op Note (Signed)
DATE OF SERVICE: 05/02/2022  PATIENT:  Alyssa Peterson  72 y.o. female  PRE-OPERATIVE DIAGNOSIS:  history of DVT/PE, plan for surgery  POST-OPERATIVE DIAGNOSIS:  Alyssa Peterson  PROCEDURE:   1) ultrasound-guided right common femoral artery access 2) inferior venacavogram 3) placement of inferior vena cava filter Lacinda Axon)  SURGEON:  Surgeon(s) and Role:    * [place] Stegemann, Yevonne Aline, MD - Primary  ASSISTANT: none  ANESTHESIA:   local and IV sedation  EBL: minimal  BLOOD ADMINISTERED:none  DRAINS: none   LOCAL MEDICATIONS USED:  LIDOCAINE   SPECIMEN:  none  COUNTS: confirmed correct.  TOURNIQUET:  none  PATIENT DISPOSITION:  PACU - hemodynamically stable.   Delay start of Pharmacological VTE agent (>24hrs) due to surgical blood loss or risk of bleeding: no  INDICATION FOR PROCEDURE: Alyssa Peterson is a 72 y.o. female with history of DVT/PE.  She is in need of thoracic surgery.. After careful discussion of risks, benefits, and alternatives the patient was offered inferior vena cava filter placement. The patient understood and wished to proceed.  OPERATIVE FINDINGS: Unremarkable placement of inferior vena cava filter  DESCRIPTION OF PROCEDURE: After identification of the patient in the pre-operative holding area, the patient was transferred to the operating room. The patient was positioned supine on the operating room table. Anesthesia was induced. The groins were prepped and draped in standard fashion. A surgical pause was performed confirming correct patient, procedure, and operative location.  Using ultrasound guidance, the right common femoral vein was cannulated with micropuncture technique.  A wire threaded easily into the inferior vena cava.  Over the wire micro sheath was exchanged for a vascular dilator.  The [facility] IVC filter system was prepped on the back table.  The sheath and dilator were introduced into the inferior vena cava.  A powered venogram was performed.  The renal veins were  marked.  The filter was deployed in standard fashion under the renal veins.  A completion venogram was performed confirming no technical problem.  All endovascular equipment was removed.  Manual pressure was held on the access site.  Hemostasis was achieved.  A bandage was applied.  Upon completion of the case instrument and sharps counts were confirmed correct. The patient was transferred to the PACU in good condition. I was present for all portions of the procedure.  Yevonne Aline. Stanford Breed, MD [facility] Phone Number: [phone] 05/02/2022 1:13 PM

## 2022-05-02 NOTE — Progress Notes (Signed)
Surgical Instructions    Your procedure is scheduled on Wednesday, February 14th, 2024.   Report to [facility] Main Entrance "A" at 06:30 A.M., then check in with the Admitting office.  Call this number if you have problems the morning of surgery:  [phone]   If you have any questions prior to your surgery date call [phone]: Open Monday-Friday 8am-4pm If you experience any cold or flu symptoms such as cough, fever, chills, shortness of breath, etc. between now and your scheduled surgery, please notify us at the above number     Remember:  Do not eat or drink after midnight the night before your surgery    Take these medicines the morning of surgery with A SIP OF WATER:   amoxicillin-clavulanate (AUGMENTIN)  fluconazole (DIFLUCAN)   As needed:  acetaminophen (TYLENOL)    Eliquis - last dose on 05/04/22 per MD    As of today, STOP taking any Aspirin (unless otherwise instructed by your surgeon) Aleve, Naproxen, Ibuprofen, Motrin, Advil, Goody's, BC's, all herbal medications, fish oil, and all vitamins.    The day of surgery:          Do not wear jewelry or makeup. Do not wear lotions, powders, perfumes or deodorant. Do not shave 48 hours prior to surgery.   Do not bring valuables to the hospital. Do not wear nail polish, gel polish, artificial nails, or any other type of covering on natural nails (fingers and toes) If you have artificial nails or gel coating that need to be removed by a nail salon, please have this removed prior to surgery. Artificial nails or gel coating may interfere with anesthesia's ability to adequately monitor your vital signs.  [facility] is not responsible for any belongings or valuables.    Do NOT Smoke (Tobacco/Vaping)  24 hours prior to your procedure  If you use a CPAP at night, you may bring your mask for your overnight stay.   Contacts, glasses, hearing aids, dentures or partials may not be worn into surgery, please bring cases for  these belongings   For patients admitted to the hospital, discharge time will be determined by your treatment team.   Patients discharged the day of surgery will not be allowed to drive home, and someone needs to stay with them for 24 hours.   SURGICAL WAITING ROOM VISITATION Patients having surgery or a procedure may have no more than 2 support people in the waiting area - these visitors may rotate.   Children under the age of 74 must have an adult with them who is not the patient. If the patient needs to stay at the hospital during part of their recovery, the visitor guidelines for inpatient rooms apply. Pre-op nurse will coordinate an appropriate time for 1 support person to accompany patient in pre-op.  This support person may not rotate.   Please refer to [email or web address] for the visitor guidelines for Inpatients (after your surgery is over and you are in a regular room).    Special instructions:    Oral Hygiene is also important to reduce your risk of infection.  Remember - BRUSH YOUR TEETH THE MORNING OF SURGERY WITH YOUR REGULAR TOOTHPASTE   [facility]- Preparing For Surgery  Before surgery, you can play an important role. Because skin is not sterile, your skin needs to be as free of germs as possible. You can reduce the number of germs on your skin by washing with CHG (chlorahexidine gluconate) Soap before surgery.  CHG is  an antiseptic cleaner which kills germs and bonds with the skin to continue killing germs even after washing.     Please do not use if you have an allergy to CHG or antibacterial soaps. If your skin becomes reddened/irritated stop using the CHG.  Do not shave (including legs and underarms) for at least 48 hours prior to first CHG shower. It is OK to shave your face.  Please follow these instructions carefully.     Shower the NIGHT BEFORE SURGERY and the MORNING OF SURGERY with CHG Soap.   If you chose  to wash your hair, wash your hair first as usual with your normal shampoo. After you shampoo, rinse your hair and body thoroughly to remove the shampoo.  Then [employer] and genitals (private parts) with your normal soap and rinse thoroughly to remove soap.  After that Use CHG Soap as you would any other liquid soap. You can apply CHG directly to the skin and wash gently with a scrungie or a clean washcloth.   Apply the CHG Soap to your body ONLY FROM THE NECK DOWN.  Do not use on open wounds or open sores. Avoid contact with your eyes, ears, mouth and genitals (private parts). Wash Face and genitals (private parts)  with your normal soap.   Wash thoroughly, paying special attention to the area where your surgery will be performed.  Thoroughly rinse your body with warm water from the neck down.  DO NOT shower/wash with your normal soap after using and rinsing off the CHG Soap.  Pat yourself dry with a CLEAN TOWEL.  Wear CLEAN PAJAMAS to bed the night before surgery  Place CLEAN SHEETS on your bed the night before your surgery  DO NOT SLEEP WITH PETS.   Day of Surgery:  Take a shower with CHG soap. Wear Clean/Comfortable clothing the morning of surgery Do not apply any deodorants/lotions.   Remember to brush your teeth WITH YOUR REGULAR TOOTHPASTE.    If you received a COVID test during your pre-op visit, it is requested that you wear a mask when out in public, stay away from anyone that may not be feeling well, and notify your surgeon if you develop symptoms. If you have been in contact with anyone that has tested positive in the last 10 days, please notify your surgeon.    Please read over the following fact sheets that you were given.

## 2022-05-02 NOTE — Progress Notes (Signed)
Patient and family received discharge instruction. Per Dr. Stanford Breed patient can resume eliquis tomorrow. Right groin site looks intact.

## 2022-05-05 ENCOUNTER — Ambulatory Visit (HOSPITAL_COMMUNITY)
Admission: RE | Admit: 2022-05-05 | Discharge: 2022-05-05 | Disposition: A | Discharge status: Home / Self Care | Payer: Medicare Other | Source: Ambulatory Visit | Attending: Thoracic Surgery (Cardiothoracic Vascular Surgery) | Admitting: Thoracic Surgery (Cardiothoracic Vascular Surgery)

## 2022-05-05 ENCOUNTER — Encounter (HOSPITAL_COMMUNITY)
Admission: RE | Admit: 2022-05-05 | Discharge: 2022-05-05 | Disposition: A | Discharge status: Home / Self Care | Payer: Medicare Other | Source: Ambulatory Visit | Attending: Thoracic Surgery (Cardiothoracic Vascular Surgery) | Admitting: Thoracic Surgery (Cardiothoracic Vascular Surgery)

## 2022-05-05 ENCOUNTER — Other Ambulatory Visit: Payer: Self-pay

## 2022-05-05 ENCOUNTER — Encounter (HOSPITAL_COMMUNITY): Payer: Self-pay | Admitting: Vascular Surgery

## 2022-05-05 VITALS — BP 105/84 | HR 107 | Temp 97.7°F | Resp 18 | Ht 63.0 in | Wt 111.5 lb

## 2022-05-05 DIAGNOSIS — Z95828 Presence of other vascular implants and grafts: Secondary | ICD-10-CM | POA: Diagnosis not present

## 2022-05-05 DIAGNOSIS — Z01818 Encounter for other preprocedural examination: Secondary | ICD-10-CM | POA: Insufficient documentation

## 2022-05-05 DIAGNOSIS — R82998 Other abnormal findings in urine: Secondary | ICD-10-CM | POA: Diagnosis not present

## 2022-05-05 DIAGNOSIS — I10 Essential (primary) hypertension: Secondary | ICD-10-CM | POA: Insufficient documentation

## 2022-05-05 DIAGNOSIS — J90 Pleural effusion, not elsewhere classified: Secondary | ICD-10-CM | POA: Insufficient documentation

## 2022-05-05 DIAGNOSIS — J939 Pneumothorax, unspecified: Secondary | ICD-10-CM | POA: Diagnosis not present

## 2022-05-05 DIAGNOSIS — Z1152 Encounter for screening for COVID-19: Secondary | ICD-10-CM | POA: Insufficient documentation

## 2022-05-05 DIAGNOSIS — J9811 Atelectasis: Secondary | ICD-10-CM | POA: Diagnosis not present

## 2022-05-05 DIAGNOSIS — K449 Diaphragmatic hernia without obstruction or gangrene: Secondary | ICD-10-CM | POA: Diagnosis not present

## 2022-05-05 DIAGNOSIS — I2699 Other pulmonary embolism without acute cor pulmonale: Secondary | ICD-10-CM | POA: Insufficient documentation

## 2022-05-05 DIAGNOSIS — M1990 Unspecified osteoarthritis, unspecified site: Secondary | ICD-10-CM | POA: Diagnosis not present

## 2022-05-05 DIAGNOSIS — R091 Pleurisy: Secondary | ICD-10-CM | POA: Diagnosis not present

## 2022-05-05 DIAGNOSIS — K219 Gastro-esophageal reflux disease without esophagitis: Secondary | ICD-10-CM | POA: Insufficient documentation

## 2022-05-05 DIAGNOSIS — K3189 Other diseases of stomach and duodenum: Secondary | ICD-10-CM | POA: Diagnosis not present

## 2022-05-05 DIAGNOSIS — J948 Other specified pleural conditions: Secondary | ICD-10-CM | POA: Diagnosis not present

## 2022-05-05 DIAGNOSIS — Z79899 Other long term (current) drug therapy: Secondary | ICD-10-CM | POA: Diagnosis not present

## 2022-05-05 DIAGNOSIS — D649 Anemia, unspecified: Secondary | ICD-10-CM | POA: Insufficient documentation

## 2022-05-05 DIAGNOSIS — K5730 Diverticulosis of large intestine without perforation or abscess without bleeding: Secondary | ICD-10-CM | POA: Insufficient documentation

## 2022-05-05 DIAGNOSIS — R339 Retention of urine, unspecified: Secondary | ICD-10-CM | POA: Diagnosis not present

## 2022-05-05 DIAGNOSIS — Z7901 Long term (current) use of anticoagulants: Secondary | ICD-10-CM | POA: Diagnosis not present

## 2022-05-05 DIAGNOSIS — I2782 Chronic pulmonary embolism: Secondary | ICD-10-CM | POA: Insufficient documentation

## 2022-05-05 DIAGNOSIS — J984 Other disorders of lung: Secondary | ICD-10-CM | POA: Diagnosis not present

## 2022-05-05 HISTORY — DX: Personal history of other diseases of the digestive system: Z87.19

## 2022-05-05 HISTORY — DX: Unspecified osteoarthritis, unspecified site: M19.90

## 2022-05-05 LAB — BLOOD GAS, ARTERIAL
Acid-Base Excess: 6.7 mmol/L — ABNORMAL HIGH (ref 0.0–2.0)
Bicarbonate: 28.7 mmol/L — ABNORMAL HIGH (ref 20.0–28.0)
Drawn by: 58743
O2 Saturation: 98.8 %
Patient temperature: 37
pCO2 arterial: 32 mmHg (ref 32–48)
pH, Arterial: 7.56 — ABNORMAL HIGH (ref 7.35–7.45)
pO2, Arterial: 128 mmHg — ABNORMAL HIGH (ref 83–108)

## 2022-05-05 LAB — URINALYSIS, ROUTINE W REFLEX MICROSCOPIC
Glucose, UA: NEGATIVE mg/dL
Hgb urine dipstick: NEGATIVE
Ketones, ur: 5 mg/dL — AB
Nitrite: NEGATIVE
Protein, ur: 100 mg/dL — AB
Specific Gravity, Urine: 1.026 (ref 1.005–1.030)
Tyrosine Crys, UA: POSITIVE
pH: 6 (ref 5.0–8.0)

## 2022-05-05 LAB — SARS CORONAVIRUS 2 (TAT 6-24 HRS): SARS Coronavirus 2: NEGATIVE

## 2022-05-05 LAB — SURGICAL PCR SCREEN
MRSA, PCR: NEGATIVE
Staphylococcus aureus: NEGATIVE

## 2022-05-05 LAB — COMPREHENSIVE METABOLIC PANEL
ALT: 17 U/L (ref 0–44)
AST: 21 U/L (ref 15–41)
Albumin: 2.4 g/dL — ABNORMAL LOW (ref 3.5–5.0)
Alkaline Phosphatase: 113 U/L (ref 38–126)
Anion gap: 12 (ref 5–15)
BUN: <5 mg/dL — ABNORMAL LOW (ref 8–23)
CO2: 23 mmol/L (ref 22–32)
Calcium: 8.4 mg/dL — ABNORMAL LOW (ref 8.9–10.3)
Chloride: 103 mmol/L (ref 98–111)
Creatinine, Ser: 0.65 mg/dL (ref 0.44–1.00)
GFR, Estimated: >60 mL/min (ref >60)
Glucose, Bld: 127 mg/dL — ABNORMAL HIGH (ref 70–99)
Potassium: 2.8 mmol/L — ABNORMAL LOW (ref 3.5–5.1)
Sodium: 138 mmol/L (ref 135–145)
Total Bilirubin: 0.5 mg/dL (ref 0.3–1.2)
Total Protein: 6.6 g/dL (ref 6.5–8.1)

## 2022-05-05 LAB — CBC
HCT: 36 % (ref 36.0–46.0)
Hemoglobin: 11.6 g/dL — ABNORMAL LOW (ref 12.0–15.0)
MCH: 27.6 pg (ref 26.0–34.0)
MCHC: 32.2 g/dL (ref 30.0–36.0)
MCV: 85.5 fL (ref 80.0–100.0)
Platelets: 493 10*3/uL — ABNORMAL HIGH (ref 150–400)
RBC: 4.21 MIL/uL (ref 3.87–5.11)
RDW: 14.2 % (ref 11.5–15.5)
WBC: 5.5 10*3/uL (ref 4.0–10.5)
nRBC: 0 % (ref 0.0–0.2)

## 2022-05-05 LAB — PROTIME-INR
INR: 1.3 — ABNORMAL HIGH (ref 0.8–1.2)
Prothrombin Time: 15.9 seconds — ABNORMAL HIGH (ref 11.4–15.2)

## 2022-05-05 LAB — APTT: aPTT: 38 seconds — ABNORMAL HIGH (ref 24–36)

## 2022-05-05 NOTE — Progress Notes (Addendum)
PCP - Geryl Rankins, NP Cardiologist - Denies   PPM/ICD - Denies Device Orders - n/a Rep Notified - n/a  Chest x-ray - 05/05/2022 EKG - 05/05/2022 Stress Test - Denies ECHO - Denies Cardiac Cath - Denies  Sleep Study - Denies CPAP - n/a  No DM  Last dose of GLP1 agonist- n/a GLP1 instructions: n/a   Blood Thinner Instructions: Pt has already stopped her Eliquis per surgeon instructions. Last dose was 05/04/22 Aspirin Instructions: n/a  NPO after midnight  COVID TEST- Yes. Result pending   Anesthesia review: Yes. Abnormal EKG. Recent hospitalization January 2024 for Hernia repair, in which time PE and pleural effusion occured. IVC drain placed 05/02/22 in order to be able to hold Eliquis for surgery. PAT abnormal labs: Potassium, ABG pH, and PT   Patient denies shortness of breath, fever, cough and chest pain at PAT appointment   All instructions explained to the patient, with a verbal understanding of the material. Patient agrees to go over the instructions while at home for a better understanding. Patient also instructed to self quarantine after being tested for COVID-19. The opportunity to ask questions was provided.

## 2022-05-06 ENCOUNTER — Encounter (HOSPITAL_COMMUNITY): Payer: Self-pay | Admitting: Vascular Surgery

## 2022-05-06 NOTE — Progress Notes (Signed)
Anesthesia Chart Review:  72 year old female with a history of hypertension, anemia, and gastroesophageal reflux.  She presented on New Year's Day with abdominal pain.  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  She underwent laparoscopic repair by Dr. Windle Guard.  During her postoperative follow-up she was noted to have a large right pleural effusion as well as fluid within the previous hernia sac.  A pigtail catheter was placed. She had a repeat CT on 04/25/2022.  Her pigtail was not draining any significant amount.  It was removed.  There was still a loculated effusion.  She underwent thoracentesis which drained 1.2 L of fluid.  Repeat CT on 04/28/2022 showed persistent loculated pleural effusion with compressive atelectasis of the right lower lobe.  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  Seen by Dr. Roxan Hockey 04/29/2022 for persistent pleural effusion.  Per note, "She has a persistent moderately large loculated right pleural effusion.  She has extensive compressive atelectasis of the right lower and middle lobes.  At this point I think her best option is VATS for decortication. Complicating factor is recently diagnosed pulmonary embolus.  Started on Eliquis.  Per radiology report this appeared to be chronic not acute.  No prior history, so likely occurred during her previous hospitalization.  Best option would probably be to place an IVC filter because she will have to be off of Eliquis for 2 days prior to surgery and then likely will need to be off it for several days afterwards."  Patient did subsequently have IVC filter placed on 05/02/2022 by Dr. Stanford Breed.  Last dose Eliquis reported 05/04/22.  Preop labs reviewed, hypokalemia potassium 2.8, mild anemia with hemoglobin 11.6.  Per epic, Dr. Roxan Hockey has reviewed these results.  EKG 05/05/2022: Sinus tachycardia.  Rate 103.  Left axis deviation.  Pulmonary disease pattern.  Nonspecific  T wave abnormality.  CTA 04/28/2022: IMPRESSION: 1. Chronic left lower lobe pulmonary embolus. No acute PE nor evidence of right heart strain. 2. Negative for acute aortic dissection or aneurysm. 3. Interval removal of right subpulmonic pigtail drain catheter, with persistent moderate right pleural effusion, possibly loculated. 4. Persistent right middle and lower lobe atelectasis/consolidation. 5. Sigmoid diverticulosis.    Wynonia Musty [facility] Short Stay Center/Anesthesiology Phone [phone] 05/06/2022 10:03 AM

## 2022-05-06 NOTE — Progress Notes (Signed)
Levonne Spiller, RN with Dr. Leonarda Salon office made aware of abnormal CMP and UA results.

## 2022-05-06 NOTE — Anesthesia Preprocedure Evaluation (Addendum)
Anesthesia Evaluation  Patient identified by MRN, date of birth, ID band Patient awake    Reviewed: Allergy & Precautions, NPO status , Patient's Chart, lab work & pertinent test results  History of Anesthesia Complications Negative for: history of anesthetic complications  Airway Mallampati: II  TM Distance: >3 FB Neck ROM: Full    Dental  (+) Dental Advisory Given, Teeth Intact   Pulmonary PE  Hx diaphragmatic hernia Rt pleural effusion    Pulmonary exam normal        Cardiovascular hypertension, Normal cardiovascular exam     Neuro/Psych negative neurological ROS  negative psych ROS   GI/Hepatic Neg liver ROS, hiatal hernia,GERD  Controlled,,  Endo/Other  negative endocrine ROS    Renal/GU negative Renal ROS     Musculoskeletal  (+) Arthritis ,    Abdominal   Peds  Hematology  On eliquis    Anesthesia Other Findings S/p IVC filter placement given recent PE on eliquis - see PAT note  Reproductive/Obstetrics                             Anesthesia Physical Anesthesia Plan  ASA: 3  Anesthesia Plan: General   Post-op Pain Management: Tylenol PO (pre-op)*   Induction: Intravenous  PONV Risk Score and Plan: 3 and Treatment may vary due to age or medical condition, Ondansetron and Dexamethasone  Airway Management Planned: Double Lumen EBT  Additional Equipment: Arterial line  Intra-op Plan:   Post-operative Plan: Extubation in OR  Informed Consent: I have reviewed the patients History and Physical, chart, labs and discussed the procedure including the risks, benefits and alternatives for the proposed anesthesia with the patient or authorized representative who has indicated his/her understanding and acceptance.     Dental advisory given  Plan Discussed with: CRNA and Anesthesiologist  Anesthesia Plan Comments: (2 large bore PIV, Vivasight DLEBT  )         Anesthesia Quick Evaluation

## 2022-05-07 ENCOUNTER — Inpatient Hospital Stay (HOSPITAL_COMMUNITY)
Admission: RE | Admit: 2022-05-07 | Discharge: 2022-05-12 | DRG: 164 | Disposition: A | Discharge status: Home / Self Care | Payer: Medicare Other | Attending: Thoracic Surgery (Cardiothoracic Vascular Surgery) | Admitting: Thoracic Surgery (Cardiothoracic Vascular Surgery)

## 2022-05-07 ENCOUNTER — Other Ambulatory Visit: Payer: Self-pay

## 2022-05-07 ENCOUNTER — Encounter (HOSPITAL_COMMUNITY): Payer: Self-pay | Admitting: Thoracic Surgery (Cardiothoracic Vascular Surgery)

## 2022-05-07 ENCOUNTER — Encounter (HOSPITAL_COMMUNITY)
Admission: RE | Disposition: A | Discharge status: Home / Self Care | Payer: Self-pay | Source: Home / Self Care | Attending: Thoracic Surgery (Cardiothoracic Vascular Surgery)

## 2022-05-07 ENCOUNTER — Inpatient Hospital Stay (HOSPITAL_COMMUNITY): Payer: Medicare Other | Admitting: Physician Assistant

## 2022-05-07 ENCOUNTER — Inpatient Hospital Stay (HOSPITAL_COMMUNITY): Payer: Medicare Other

## 2022-05-07 ENCOUNTER — Inpatient Hospital Stay (HOSPITAL_COMMUNITY): Payer: Medicare Other | Admitting: Anesthesiology

## 2022-05-07 DIAGNOSIS — R82998 Other abnormal findings in urine: Secondary | ICD-10-CM | POA: Diagnosis not present

## 2022-05-07 DIAGNOSIS — K219 Gastro-esophageal reflux disease without esophagitis: Secondary | ICD-10-CM | POA: Diagnosis present

## 2022-05-07 DIAGNOSIS — I2699 Other pulmonary embolism without acute cor pulmonale: Secondary | ICD-10-CM

## 2022-05-07 DIAGNOSIS — J90 Pleural effusion, not elsewhere classified: Principal | ICD-10-CM | POA: Diagnosis present

## 2022-05-07 DIAGNOSIS — Z1152 Encounter for screening for COVID-19: Secondary | ICD-10-CM | POA: Diagnosis not present

## 2022-05-07 DIAGNOSIS — I2782 Chronic pulmonary embolism: Secondary | ICD-10-CM | POA: Diagnosis present

## 2022-05-07 DIAGNOSIS — J984 Other disorders of lung: Secondary | ICD-10-CM | POA: Diagnosis present

## 2022-05-07 DIAGNOSIS — J9811 Atelectasis: Secondary | ICD-10-CM | POA: Diagnosis present

## 2022-05-07 DIAGNOSIS — I10 Essential (primary) hypertension: Secondary | ICD-10-CM | POA: Diagnosis present

## 2022-05-07 DIAGNOSIS — M1990 Unspecified osteoarthritis, unspecified site: Secondary | ICD-10-CM | POA: Diagnosis not present

## 2022-05-07 DIAGNOSIS — R339 Retention of urine, unspecified: Secondary | ICD-10-CM | POA: Diagnosis not present

## 2022-05-07 DIAGNOSIS — Z79899 Other long term (current) drug therapy: Secondary | ICD-10-CM

## 2022-05-07 DIAGNOSIS — Z95828 Presence of other vascular implants and grafts: Secondary | ICD-10-CM

## 2022-05-07 DIAGNOSIS — D649 Anemia, unspecified: Secondary | ICD-10-CM | POA: Diagnosis not present

## 2022-05-07 DIAGNOSIS — Z7901 Long term (current) use of anticoagulants: Secondary | ICD-10-CM | POA: Diagnosis not present

## 2022-05-07 DIAGNOSIS — K3189 Other diseases of stomach and duodenum: Secondary | ICD-10-CM | POA: Diagnosis present

## 2022-05-07 HISTORY — PX: VIDEO ASSISTED THORACOSCOPY (VATS)/DECORTICATION: SHX6171

## 2022-05-07 HISTORY — PX: PLEURAL EFFUSION DRAINAGE: SHX5099

## 2022-05-07 LAB — POCT I-STAT, CHEM 8
BUN: 4 mg/dL — ABNORMAL LOW (ref 8–23)
Calcium, Ion: 1 mmol/L — ABNORMAL LOW (ref 1.15–1.40)
Chloride: 105 mmol/L (ref 98–111)
Creatinine, Ser: 0.6 mg/dL (ref 0.44–1.00)
Glucose, Bld: 135 mg/dL — ABNORMAL HIGH (ref 70–99)
HCT: 37 % (ref 36.0–46.0)
Hemoglobin: 12.6 g/dL (ref 12.0–15.0)
Potassium: 3.2 mmol/L — ABNORMAL LOW (ref 3.5–5.1)
Sodium: 140 mmol/L (ref 135–145)
TCO2: 25 mmol/L (ref 22–32)

## 2022-05-07 SURGERY — VIDEO ASSISTED THORACOSCOPY (VATS)/DECORTICATION
Anesthesia: General | Site: Chest | Laterality: Right

## 2022-05-07 MED ORDER — OXYCODONE HCL 5 MG/5ML PO SOLN: 5.0000 mg | Freq: Once | ORAL | Status: DC | PRN

## 2022-05-07 MED ORDER — ONDANSETRON HCL 4 MG/2ML IJ SOLN
INTRAMUSCULAR | Status: DC | PRN
  Administered 2022-05-07: 4 mg via INTRAVENOUS

## 2022-05-07 MED ORDER — LACTATED RINGERS IV SOLN: INTRAVENOUS | Status: DC | PRN

## 2022-05-07 MED ORDER — OXYCODONE HCL 5 MG PO TABS
5.0000 mg | ORAL_TABLET | ORAL | Status: DC | PRN
  Administered 2022-05-09 – 2022-05-12 (×2): 5 mg via ORAL
  Filled 2022-05-07 (×2): qty 1

## 2022-05-07 MED ORDER — ALBUTEROL SULFATE (2.5 MG/3ML) 0.083% IN NEBU
2.5000 mg | INHALATION_SOLUTION | RESPIRATORY_TRACT | Status: DC
  Filled 2022-05-07: qty 3

## 2022-05-07 MED ORDER — PHENYLEPHRINE 80 MCG/ML (10ML) SYRINGE FOR IV PUSH (FOR BLOOD PRESSURE SUPPORT)
PREFILLED_SYRINGE | INTRAVENOUS | Status: AC
  Filled 2022-05-07: qty 10

## 2022-05-07 MED ORDER — CALCIUM CARBONATE ANTACID 500 MG PO CHEW: 1.0000 | CHEWABLE_TABLET | Freq: Every day | ORAL | Status: DC | PRN

## 2022-05-07 MED ORDER — LATANOPROST 0.005 % OP SOLN
1.0000 [drp] | Freq: Every day | OPHTHALMIC | Status: DC
  Administered 2022-05-07 – 2022-05-11 (×5): 1 [drp] via OPHTHALMIC
  Filled 2022-05-07: qty 2.5

## 2022-05-07 MED ORDER — PHENYLEPHRINE 80 MCG/ML (10ML) SYRINGE FOR IV PUSH (FOR BLOOD PRESSURE SUPPORT)
PREFILLED_SYRINGE | INTRAVENOUS | Status: DC | PRN
  Administered 2022-05-07 (×2): 80 ug via INTRAVENOUS

## 2022-05-07 MED ORDER — DEXAMETHASONE SODIUM PHOSPHATE 10 MG/ML IJ SOLN
INTRAMUSCULAR | Status: DC | PRN
  Administered 2022-05-07: 10 mg via INTRAVENOUS

## 2022-05-07 MED ORDER — MIDAZOLAM HCL 5 MG/5ML IJ SOLN
INTRAMUSCULAR | Status: DC | PRN
  Administered 2022-05-07 (×2): 1 mg via INTRAVENOUS

## 2022-05-07 MED ORDER — CHLORHEXIDINE GLUCONATE CLOTH 2 % EX PADS
6.0000 | MEDICATED_PAD | Freq: Every day | CUTANEOUS | Status: DC
  Administered 2022-05-07 – 2022-05-12 (×5): 6 via TOPICAL

## 2022-05-07 MED ORDER — BISACODYL 5 MG PO TBEC
10.0000 mg | DELAYED_RELEASE_TABLET | Freq: Every day | ORAL | Status: DC
  Administered 2022-05-07 – 2022-05-10 (×4): 10 mg via ORAL
  Filled 2022-05-07 (×5): qty 2

## 2022-05-07 MED ORDER — ONDANSETRON HCL 4 MG/2ML IJ SOLN
INTRAMUSCULAR | Status: AC
  Filled 2022-05-07: qty 2

## 2022-05-07 MED ORDER — CHLORHEXIDINE GLUCONATE 0.12 % MT SOLN: 15.0000 mL | Freq: Once | OROMUCOSAL | Status: AC

## 2022-05-07 MED ORDER — SUGAMMADEX SODIUM 200 MG/2ML IV SOLN
INTRAVENOUS | Status: DC | PRN
  Administered 2022-05-07: 200 mg via INTRAVENOUS

## 2022-05-07 MED ORDER — DEXTROSE-NACL 5-0.9 % IV SOLN: INTRAVENOUS | Status: DC

## 2022-05-07 MED ORDER — PANTOPRAZOLE SODIUM 40 MG PO TBEC
40.0000 mg | DELAYED_RELEASE_TABLET | Freq: Every day | ORAL | Status: DC
  Administered 2022-05-08 – 2022-05-12 (×5): 40 mg via ORAL
  Filled 2022-05-07 (×5): qty 1

## 2022-05-07 MED ORDER — ACETAMINOPHEN 500 MG PO TABS
ORAL_TABLET | ORAL | Status: AC
  Administered 2022-05-07: 1000 mg via ORAL
  Filled 2022-05-07: qty 2

## 2022-05-07 MED ORDER — ENOXAPARIN SODIUM 40 MG/0.4ML IJ SOSY
40.0000 mg | PREFILLED_SYRINGE | Freq: Every day | INTRAMUSCULAR | Status: DC
  Administered 2022-05-08 – 2022-05-12 (×5): 40 mg via SUBCUTANEOUS
  Filled 2022-05-07 (×5): qty 0.4

## 2022-05-07 MED ORDER — BUPIVACAINE LIPOSOME 1.3 % IJ SUSP
INTRAMUSCULAR | Status: DC | PRN
  Administered 2022-05-07: 15 mL

## 2022-05-07 MED ORDER — ACETAMINOPHEN 160 MG/5ML PO SOLN: 1000.0000 mg | Freq: Four times a day (QID) | ORAL | Status: AC

## 2022-05-07 MED ORDER — DEXAMETHASONE SODIUM PHOSPHATE 10 MG/ML IJ SOLN
INTRAMUSCULAR | Status: AC
  Filled 2022-05-07: qty 1

## 2022-05-07 MED ORDER — 0.9 % SODIUM CHLORIDE (POUR BTL) OPTIME
TOPICAL | Status: DC | PRN
  Administered 2022-05-07: 2000 mL
  Administered 2022-05-07: 1000 mL

## 2022-05-07 MED ORDER — CEFAZOLIN SODIUM-DEXTROSE 2-4 GM/100ML-% IV SOLN: 2.0000 g | INTRAVENOUS | Status: DC

## 2022-05-07 MED ORDER — ACETAMINOPHEN 500 MG PO TABS
1000.0000 mg | ORAL_TABLET | Freq: Four times a day (QID) | ORAL | Status: AC
  Administered 2022-05-07 – 2022-05-12 (×16): 1000 mg via ORAL
  Filled 2022-05-07 (×18): qty 2

## 2022-05-07 MED ORDER — POLYETHYLENE GLYCOL 3350 17 G PO PACK: 17.0000 g | PACK | Freq: Every day | ORAL | Status: DC | PRN

## 2022-05-07 MED ORDER — AMLODIPINE BESYLATE 10 MG PO TABS
10.0000 mg | ORAL_TABLET | Freq: Every day | ORAL | Status: DC
  Filled 2022-05-07: qty 1

## 2022-05-07 MED ORDER — CHLORHEXIDINE GLUCONATE 0.12 % MT SOLN
OROMUCOSAL | Status: AC
  Administered 2022-05-07: 15 mL via OROMUCOSAL
  Filled 2022-05-07: qty 15

## 2022-05-07 MED ORDER — ROCURONIUM BROMIDE 10 MG/ML (PF) SYRINGE
PREFILLED_SYRINGE | INTRAVENOUS | Status: DC | PRN
  Administered 2022-05-07: 70 mg via INTRAVENOUS

## 2022-05-07 MED ORDER — FENTANYL CITRATE (PF) 250 MCG/5ML IJ SOLN
INTRAMUSCULAR | Status: AC
  Filled 2022-05-07: qty 5

## 2022-05-07 MED ORDER — LIDOCAINE 2% (20 MG/ML) 5 ML SYRINGE
INTRAMUSCULAR | Status: AC
  Filled 2022-05-07: qty 5

## 2022-05-07 MED ORDER — ACETAMINOPHEN 500 MG PO TABS: 1000.0000 mg | ORAL_TABLET | Freq: Once | ORAL | Status: AC

## 2022-05-07 MED ORDER — TRAMADOL HCL 50 MG PO TABS
50.0000 mg | ORAL_TABLET | Freq: Four times a day (QID) | ORAL | Status: DC | PRN
  Administered 2022-05-08 – 2022-05-10 (×2): 100 mg via ORAL
  Filled 2022-05-07 (×2): qty 2

## 2022-05-07 MED ORDER — ROCURONIUM BROMIDE 10 MG/ML (PF) SYRINGE
PREFILLED_SYRINGE | INTRAVENOUS | Status: AC
  Filled 2022-05-07: qty 10

## 2022-05-07 MED ORDER — OXYCODONE HCL 5 MG PO TABS: 5.0000 mg | ORAL_TABLET | Freq: Once | ORAL | Status: DC | PRN

## 2022-05-07 MED ORDER — FENTANYL CITRATE (PF) 250 MCG/5ML IJ SOLN
INTRAMUSCULAR | Status: DC | PRN
  Administered 2022-05-07: 25 ug via INTRAVENOUS
  Administered 2022-05-07: 50 ug via INTRAVENOUS

## 2022-05-07 MED ORDER — FENTANYL CITRATE (PF) 100 MCG/2ML IJ SOLN
25.0000 ug | INTRAMUSCULAR | Status: DC | PRN
  Administered 2022-05-07 (×2): 25 ug via INTRAVENOUS

## 2022-05-07 MED ORDER — FENTANYL CITRATE PF 50 MCG/ML IJ SOSY: 25.0000 ug | PREFILLED_SYRINGE | INTRAMUSCULAR | Status: DC | PRN

## 2022-05-07 MED ORDER — ORAL CARE MOUTH RINSE: 15.0000 mL | Freq: Once | OROMUCOSAL | Status: AC

## 2022-05-07 MED ORDER — ONDANSETRON HCL 4 MG/2ML IJ SOLN: 4.0000 mg | Freq: Once | INTRAMUSCULAR | Status: DC | PRN

## 2022-05-07 MED ORDER — SENNOSIDES-DOCUSATE SODIUM 8.6-50 MG PO TABS
1.0000 | ORAL_TABLET | Freq: Every day | ORAL | Status: DC
  Administered 2022-05-07 – 2022-05-10 (×4): 1 via ORAL
  Filled 2022-05-07 (×5): qty 1

## 2022-05-07 MED ORDER — LACTATED RINGERS IV SOLN: INTRAVENOUS | Status: DC

## 2022-05-07 MED ORDER — ONDANSETRON HCL 4 MG/2ML IJ SOLN: 4.0000 mg | Freq: Four times a day (QID) | INTRAMUSCULAR | Status: DC | PRN

## 2022-05-07 MED ORDER — MIDAZOLAM HCL 2 MG/2ML IJ SOLN
INTRAMUSCULAR | Status: AC
  Filled 2022-05-07: qty 2

## 2022-05-07 MED ORDER — BUPIVACAINE HCL (PF) 0.5 % IJ SOLN
INTRAMUSCULAR | Status: AC
  Filled 2022-05-07: qty 30

## 2022-05-07 MED ORDER — CEFAZOLIN SODIUM-DEXTROSE 2-4 GM/100ML-% IV SOLN
INTRAVENOUS | Status: AC
  Filled 2022-05-07: qty 100

## 2022-05-07 MED ORDER — SALINE FLUSH 0.9 % IV SOLN
5.0000 mL | Freq: Two times a day (BID) | INTRAVENOUS | Status: DC
  Administered 2022-05-07: 5 mL via INTRAVENOUS

## 2022-05-07 MED ORDER — KETOROLAC TROMETHAMINE 15 MG/ML IJ SOLN
15.0000 mg | Freq: Four times a day (QID) | INTRAMUSCULAR | Status: DC
  Administered 2022-05-07 – 2022-05-08 (×4): 15 mg via INTRAVENOUS
  Filled 2022-05-07 (×4): qty 1

## 2022-05-07 MED ORDER — PROPOFOL 10 MG/ML IV BOLUS
INTRAVENOUS | Status: DC | PRN
  Administered 2022-05-07: 80 mg via INTRAVENOUS

## 2022-05-07 MED ORDER — CEFAZOLIN SODIUM-DEXTROSE 2-3 GM-%(50ML) IV SOLR
INTRAVENOUS | Status: DC | PRN
  Administered 2022-05-07: 2 g via INTRAVENOUS

## 2022-05-07 MED ORDER — FENTANYL CITRATE (PF) 100 MCG/2ML IJ SOLN
INTRAMUSCULAR | Status: AC
  Filled 2022-05-07: qty 2

## 2022-05-07 MED ORDER — ALBUTEROL SULFATE (2.5 MG/3ML) 0.083% IN NEBU: 2.5000 mg | INHALATION_SOLUTION | RESPIRATORY_TRACT | Status: DC

## 2022-05-07 MED ORDER — LIDOCAINE 2% (20 MG/ML) 5 ML SYRINGE
INTRAMUSCULAR | Status: DC | PRN
  Administered 2022-05-07: 100 mg via INTRAVENOUS

## 2022-05-07 MED ORDER — PROPOFOL 10 MG/ML IV BOLUS
INTRAVENOUS | Status: AC
  Filled 2022-05-07: qty 20

## 2022-05-07 MED ORDER — PHENYLEPHRINE HCL-NACL 20-0.9 MG/250ML-% IV SOLN
INTRAVENOUS | Status: DC | PRN
  Administered 2022-05-07: 15 ug/min via INTRAVENOUS

## 2022-05-07 MED ORDER — BUPIVACAINE LIPOSOME 1.3 % IJ SUSP
INTRAMUSCULAR | Status: AC
  Filled 2022-05-07: qty 20

## 2022-05-07 MED ORDER — FLUCONAZOLE 200 MG PO TABS: 400.0000 mg | ORAL_TABLET | Freq: Every day | ORAL | Status: DC

## 2022-05-07 MED ORDER — ALBUTEROL SULFATE (2.5 MG/3ML) 0.083% IN NEBU: 2.5000 mg | INHALATION_SOLUTION | RESPIRATORY_TRACT | Status: DC | PRN

## 2022-05-07 MED ORDER — CEFAZOLIN SODIUM-DEXTROSE 2-4 GM/100ML-% IV SOLN
2.0000 g | Freq: Three times a day (TID) | INTRAVENOUS | Status: AC
  Administered 2022-05-07 – 2022-05-08 (×2): 2 g via INTRAVENOUS
  Filled 2022-05-07 (×2): qty 100

## 2022-05-07 SURGICAL SUPPLY — 95 items
ANTIFOG SOL W/FOAM PAD STRL (MISCELLANEOUS) ×2
APPLICATOR TIP EXT COSEAL (VASCULAR PRODUCTS) IMPLANT
BLADE CLIPPER SURG (BLADE) ×2 IMPLANT
CANISTER SUCT 3000ML PPV (MISCELLANEOUS) ×2 IMPLANT
CATH THORACIC 28FR (CATHETERS) IMPLANT
CATH THORACIC 28FR RT ANG (CATHETERS) IMPLANT
CATH THORACIC 36FR (CATHETERS) IMPLANT
CATH THORACIC 36FR RT ANG (CATHETERS) IMPLANT
CLEANER TIP ELECTROSURG 2X2 (MISCELLANEOUS) IMPLANT
CLIP VESOCCLUDE MED 6/CT (CLIP) ×2 IMPLANT
CNTNR URN SCR LID CUP LEK RST (MISCELLANEOUS) ×4 IMPLANT
CONN ST 1/4X3/8  BEN (MISCELLANEOUS)
CONN ST 1/4X3/8 BEN (MISCELLANEOUS) IMPLANT
CONN Y 3/8X3/8X3/8  BEN (MISCELLANEOUS)
CONN Y 3/8X3/8X3/8 BEN (MISCELLANEOUS) IMPLANT
CONNECTOR BLAKE 2:1 CARIO BLK (MISCELLANEOUS) IMPLANT
CONT SPEC 4OZ STRL OR WHT (MISCELLANEOUS) ×4
COVER SURGICAL LIGHT HANDLE (MISCELLANEOUS) ×2 IMPLANT
DERMABOND ADVANCED .7 DNX12 (GAUZE/BANDAGES/DRESSINGS) IMPLANT
DRAIN CHANNEL 28F RND 3/8 FF (WOUND CARE) IMPLANT
DRAIN CHANNEL 32F RND 10.7 FF (WOUND CARE) IMPLANT
DRAPE CV SPLIT W-CLR ANES SCRN (DRAPES) ×2 IMPLANT
DRAPE ORTHO SPLIT 77X108 STRL (DRAPES) ×2
DRAPE SURG ORHT 6 SPLT 77X108 (DRAPES) ×2 IMPLANT
DRAPE WARM FLUID 44X44 (DRAPES) ×2 IMPLANT
ELECT BLADE 6.5 EXT (BLADE) ×2 IMPLANT
ELECT REM PT RETURN 9FT ADLT (ELECTROSURGICAL) ×2
ELECTRODE REM PT RTRN 9FT ADLT (ELECTROSURGICAL) ×2 IMPLANT
GAUZE 4X4 16PLY ~~LOC~~+RFID DBL (SPONGE) ×2 IMPLANT
GAUZE SPONGE 4X4 12PLY STRL (GAUZE/BANDAGES/DRESSINGS) ×2 IMPLANT
GLOVE BIOGEL PI IND STRL 6 (GLOVE) IMPLANT
GLOVE BIOGEL PI IND STRL 7.0 (GLOVE) IMPLANT
GLOVE SS BIOGEL STRL SZ 7.5 (GLOVE) ×2 IMPLANT
GLOVE SURG SIGNA 7.5 PF LTX (GLOVE) ×4 IMPLANT
GLOVE SURG SS PI 7.5 STRL IVOR (GLOVE) IMPLANT
GOWN STRL REUS W/ TWL LRG LVL3 (GOWN DISPOSABLE) ×4 IMPLANT
GOWN STRL REUS W/ TWL XL LVL3 (GOWN DISPOSABLE) ×2 IMPLANT
GOWN STRL REUS W/TWL LRG LVL3 (GOWN DISPOSABLE) ×4
GOWN STRL REUS W/TWL XL LVL3 (GOWN DISPOSABLE) ×4
HEMOSTAT SURGICEL 2X14 (HEMOSTASIS) IMPLANT
KIT BASIN OR (CUSTOM PROCEDURE TRAY) ×2 IMPLANT
KIT TURNOVER KIT B (KITS) ×2 IMPLANT
NDL HYPO 25GX1X1/2 BEV (NEEDLE) ×2 IMPLANT
NDL SPNL 18GX3.5 QUINCKE PK (NEEDLE) IMPLANT
NDL SPNL 22GX3.5 QUINCKE BK (NEEDLE) ×2 IMPLANT
NEEDLE HYPO 25GX1X1/2 BEV (NEEDLE) ×2 IMPLANT
NEEDLE SPNL 18GX3.5 QUINCKE PK (NEEDLE) IMPLANT
NEEDLE SPNL 22GX3.5 QUINCKE BK (NEEDLE) ×2 IMPLANT
NS IRRIG 1000ML POUR BTL (IV SOLUTION) ×6 IMPLANT
PACK CHEST (CUSTOM PROCEDURE TRAY) ×2 IMPLANT
PAD ARMBOARD 7.5X6 YLW CONV (MISCELLANEOUS) ×4 IMPLANT
POUCH ENDO CATCH II 15MM (MISCELLANEOUS) IMPLANT
SEALANT PROGEL (MISCELLANEOUS) IMPLANT
SEALANT SURG COSEAL 4ML (VASCULAR PRODUCTS) IMPLANT
SEALANT SURG COSEAL 8ML (VASCULAR PRODUCTS) IMPLANT
SOL ANTI FOG 6CC (MISCELLANEOUS) ×2 IMPLANT
SOLUTION ANTFG W/FOAM PAD STRL (MISCELLANEOUS) IMPLANT
SPONGE T-LAP 18X18 ~~LOC~~+RFID (SPONGE) ×8 IMPLANT
SPONGE T-LAP 4X18 ~~LOC~~+RFID (SPONGE) ×2 IMPLANT
SPONGE TONSIL 1 RF SGL (DISPOSABLE) IMPLANT
SPONGE TONSIL TAPE 1 RFD (DISPOSABLE) ×2 IMPLANT
STAPLER ENDO GIA 12 SHRT THIN (STAPLE) IMPLANT
STAPLER ENDO GIA 12MM SHORT (STAPLE) IMPLANT
SUT PDS AB 2-0 CT1 27 (SUTURE) IMPLANT
SUT PROLENE 4 0 RB 1 (SUTURE) ×2
SUT PROLENE 4-0 RB1 .5 CRCL 36 (SUTURE) IMPLANT
SUT SILK  1 MH (SUTURE) ×4
SUT SILK 1 MH (SUTURE) ×4 IMPLANT
SUT SILK 1 TIES 10X30 (SUTURE) ×2 IMPLANT
SUT SILK 2 0SH CR/8 30 (SUTURE) IMPLANT
SUT SILK 3 0SH CR/8 30 (SUTURE) IMPLANT
SUT VIC AB 1 CTX 27 (SUTURE) IMPLANT
SUT VIC AB 1 CTX 36 (SUTURE)
SUT VIC AB 1 CTX36XBRD ANBCTR (SUTURE) IMPLANT
SUT VIC AB 2-0 CTX 36 (SUTURE) IMPLANT
SUT VIC AB 3-0 MH 27 (SUTURE) IMPLANT
SUT VIC AB 3-0 X1 27 (SUTURE) IMPLANT
SUT VICRYL 2 TP 1 (SUTURE) IMPLANT
SYR 10ML LL (SYRINGE) IMPLANT
SYR 30ML LL (SYRINGE) ×2 IMPLANT
SYS BAG RETRIEVAL 10MM (BASKET)
SYSTEM BAG RETRIEVAL 10MM (BASKET) IMPLANT
SYSTEM SAHARA CHEST DRAIN ATS (WOUND CARE) ×2 IMPLANT
TAPE CLOTH 4X10 WHT NS (GAUZE/BANDAGES/DRESSINGS) ×2 IMPLANT
TAPE CLOTH SURG 4X10 WHT LF (GAUZE/BANDAGES/DRESSINGS) IMPLANT
TIP APPLICATOR SPRAY EXTEND 16 (VASCULAR PRODUCTS) IMPLANT
TOWEL GREEN STERILE (TOWEL DISPOSABLE) ×2 IMPLANT
TOWEL GREEN STERILE FF (TOWEL DISPOSABLE) ×2 IMPLANT
TRAP SPECIMEN MUCUS 40CC (MISCELLANEOUS) IMPLANT
TRAY FOLEY MTR SLVR 16FR STAT (SET/KITS/TRAYS/PACK) ×2 IMPLANT
TROCAR XCEL BLADELESS 5X75MML (TROCAR) ×2 IMPLANT
TROCAR XCEL NON-BLD 11X100MML (ENDOMECHANICALS) IMPLANT
TROCAR XCEL NON-BLD 5MMX100MML (ENDOMECHANICALS) IMPLANT
TUBE CONNECTING 12X1/4 (SUCTIONS) IMPLANT
WATER STERILE IRR 1000ML POUR (IV SOLUTION) ×2 IMPLANT

## 2022-05-07 NOTE — Discharge Instructions (Signed)
oracoscopy, Care After The following information offers guidance on how to care for yourself after your procedure. Your health care provider may also give you more specific instructions. If you have problems or questions, contact your health care provider. What can I expect after the procedure? After the procedure, it is common to have pain and soreness in the surgical area. Follow these instructions at home: Incision care  Follow instructions from your health care provider about how to take care of your incisions. Make sure you: Wash your hands with soap and water for at least 20 seconds before and after you change your bandage (dressing). If soap and water are not available, use hand sanitizer. Change your dressing as told by your health care provider. Leave stitches (sutures), staples, skin glue, or adhesive strips in place. These skin closures may need to stay in place for 2 weeks or longer. If adhesive strip edges start to loosen and curl up, you may trim the loose edges. Do not remove adhesive strips completely unless your health care provider tells you to do that. Check your incision areas every day for signs of infection. Check for: More redness, swelling, or pain. Fluid or blood. Warmth. Pus or a bad smell. Do not take baths, swim, or use a hot tub until your health care provider approves. You may take showers. Medicines Take over-the-counter and prescription medicines only as told by your health care provider. If you were prescribed an antibiotic medicine, take it as told by your health care provider. Do not stop taking the antibiotic even if you start to feel better. Ask your health care provider if the medicine prescribed to you: Requires you to avoid driving or using machinery. Can cause constipation. You may need to take these actions to prevent or treat constipation: Drink enough fluid to keep your urine pale yellow. Take over-the-counter or prescription medicines. Eat foods  that are high in fiber, such as beans, whole grains, and fresh fruits and vegetables. Limit foods that are high in fat and processed sugars, such as fried or sweet foods. Preventing lung infection To prevent pneumonia and to keep your lungs healthy: Try to cough often. If it hurts to cough, hold a pillow against your chest as you cough. Take deep breaths or do breathing exercises as told by your health care provider. If you were given an incentive spirometer, use it as told by your health care provider. General instructions You may have to avoid lifting. Ask your health care provider how much you can safely lift. Do not use any products that contain nicotine or tobacco. These products include cigarettes, chewing tobacco, and vaping devices, such as e-cigarettes. These can delay healing after the procedure. If you need help quitting, ask your health care provider. Avoid driving until your health care provider approves. If you have a chest drainage tube, care for it as told by your health care provider. Do not travel by airplane after the chest drainage tube is removed until your health care provider approves. Keep all follow-up visits. This is important. Contact a health care provider if: You have a fever. Pain medicines do not ease your pain. You have more redness, swelling, or pain around your incision area. You have fluid or blood coming from your incision area. An incision feels warm to the touch. You develop a cough that does not go away, or you are coughing up mucus that is yellow or green. You have pus or a bad smell coming from an incision or  dressing. Get help right away if: You cough up blood. You develop light-headedness, or you feel faint. You have difficulty breathing or an increased shortness of breath. You develop chest pain. Your heartbeat feels irregular or very fast. These symptoms may be an emergency. Get help right away. Call 911. Do not wait to see if the symptoms will  go away. Do not drive yourself to the hospital. Summary Follow instructions from your health care provider about how to take care of your incisions. Ask your health care provider if the medicine prescribed to you requires you to avoid driving or using machinery. Leave stitches (sutures), staples, skin glue, or adhesive strips in place. Check your incision areas every day for signs of infection. This information is not intended to replace advice given to you by your health care provider. Make sure you discuss any questions you have with your health care provider. Document Revised: 10/03/2020 Document Reviewed: 10/03/2020 Elsevier Patient Education  [place].

## 2022-05-07 NOTE — Discharge Summary (Signed)
[address]       [place] [address]             [phone]    Physician Discharge Summary  Patient ID: Alyssa Peterson MRN: [id number] DOB/AGE: November 23, 1950 72 y.o.  Admit date: 05/07/2022 Discharge date: 05/12/2022  Admission Diagnoses:  Patient Active Problem List   Diagnosis Date Noted   Loculated pleural effusion 05/07/2022   Pulmonary embolus ([place]) 04/29/2022   Intra-abdominal abscess ([place]) 04/08/2022   Protein-calorie malnutrition, severe 04/03/2022   Hiatal hernia 03/24/2022   Essential hypertension 11/21/2020   Perforated abdominal viscus 07/04/2020   Family history of colonic polyps 04/04/2016     Discharge Diagnoses:  Patient Active Problem List   Diagnosis Date Noted   Loculated pleural effusion 05/07/2022   Pulmonary embolus ([place]) 04/29/2022   Intra-abdominal abscess ([place]) 04/08/2022   Protein-calorie malnutrition, severe 04/03/2022   Hiatal hernia 03/24/2022   Essential hypertension 11/21/2020   Perforated abdominal viscus 07/04/2020   Family history of colonic polyps 04/04/2016     Discharged Condition: Stable   HPI: At time of CT surgical consultation  Ms. Zingg returns for a follow-up of a right pleural effusion after repair of an incarcerated diaphragmatic hernia.   Alyssa Peterson is a 72 year old woman with a history of hypertension, anemia, and gastroesophageal reflux.  She presented on New Year's Day with abdominal pain.  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  She underwent laparoscopic repair by Dr. Windle Guard.  During her postoperative follow-up she was noted to have a large right pleural effusion as well as fluid within the previous hernia sac.  A pigtail catheter was placed.   She had a repeat CT on 04/25/2022.  Her pigtail was not draining any significant amount.  It was removed.  There was still a loculated effusion.  She underwent thoracentesis which drained 1.2 L of fluid.  Repeat CT  on 04/28/2022 showed persistent loculated pleural effusion with compressive atelectasis of the right lower lobe.  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   She gets short of breath with even relatively light activities.  Not short of breath at rest.  She is eating well.  The patient and all relevant studies were reviewed by Dr. Roxan Hockey who recommended video-assisted thoracoscopic exploration for decortication and drainage of complex effusion.  Hospital course: The patient was admitted electively and taken to the operating room on 05/07/2022 at which time she underwent right VATS for drainage and decortication of complex effusion.  She tolerated procedure well was taken to the postanesthesia care unit in stable condition. A line and foley were removed early in her post operative course. She was on Lovenox for DVT prophylaxis. Chest tubes remained to suction and there was no air leak. Daily chest x rays were obtained and remained stable. Anterior chest tube was removed on 02/16. She did develop urinary rentention. Bladder scan showed 207 cc. She was put on Flomax. Urinary retention resolved. UA showed WBC so she was treated with a 3 day course of Levaquin. She was restarted on Augmentin and Flagyl. She is ambulating on room air with good oxygenation. All wounds are clean, dry, healing without signs of infection. She has been tolerating a diet. She is felt surgically stable for discharge.  Gram stain from OR showed or WBC or organisms seen. OR culture showed no growth for 4 days.  She was restarted on Apixaban (history of chronic LLL PE) on 02/19. Posterior chest tube was removed on 02/19. Follow up CXR showed minimal right apical pneumothorax and right base atelectasis. She is felt surgically stable for discharge today.  Consults: None  Significant Diagnostic Studies:   Narrative & Impression  CLINICAL DATA:  Loculated pleural effusion status  post video-assisted thoracoscopy and decortication on the right.   EXACLINICAL DATA:  Encounter for chest tube removal   EXAM: CHEST - 1 VIEW SAME DAY   COMPARISON:  Portable exam [id number] hours compared to 05/12/2022 at 0544 hours   FINDINGS: Interval removal of RIGHT thoracostomy tube.   Minimal RIGHT apical pneumothorax.   Normal heart size, mediastinal contours, and pulmonary vascularity.   Elevation of RIGHT diaphragm with RIGHT basilar atelectasis.   LEFT lung clear.   Thoracolumbar scoliosis.   IMPRESSION: Minimal RIGHT apical pneumothorax following chest tube removal.   RIGHT basilar atelectasis unchanged.   Findings called to April RN on [place] on 05/12/2022 at 1353 hours.     Electronically Signed   By: Lavonia Dana M.D.   On: 05/12/2022 13:31M: PORTABLE CHEST 1 VIEW   COMPARISON:  05/05/2022.   FINDINGS: 1100 hours.   Interval placement of 2 right-sided chest tubes with marked decrease in the loculated right pleural effusion. Persistent opacity in the right lung base, likely atelectasis given volume loss, though consolidation is possible. No pneumothorax. Left lung is clear. Stable cardiac and mediastinal contours with tortuosity of the descending thoracic aorta.   IMPRESSION: Expected postoperative changes from VATS decortication with 2 right-sided chest tubes and marked decrease in the loculated right pleural effusion. Persistent atelectasis versus consolidation in the right lung base. No pneumothorax     Electronically Signed   By: Emmit Alexanders M.D.   On: 05/07/2022 11:22      Treatments: surgery:  Right video-assisted thoracoscopy, drainage of pleural effusion, parietal pleural decortication by Dr. Roxan Hockey on 05/07/2022.  Discharge Exam: Cardiovascular: RRR Pulmonary: Clear to auscultation on left and slightly diminished right basilar breath sounds. Abdomen: Soft, non tender, bowel sounds present. Extremities: SCDs in place Wounds:  Clean and dry.  No erythema or signs of infection. Chest Tube :to suction, no water seal   Discharge Medications:   Allergies as of 05/12/2022       Reactions   Latex Itching   hands        Medication List     TAKE these medications    acetaminophen 325 MG tablet Commonly known as: TYLENOL Take 2 tablets (650 mg total) by mouth every 6 (six) hours as needed for mild pain (or temp > 100).   amLODipine 10 MG tablet Commonly known as: NORVASC Take 1 tablet (10 mg total) by mouth at bedtime.   amoxicillin-clavulanate 875-125 MG tablet Commonly known as: AUGMENTIN Take 1 tablet by mouth 2 (two) times daily.   calcium carbonate 500 MG chewable tablet Commonly known as: TUMS - dosed in mg elemental calcium Chew 1 tablet by mouth daily as needed (Upset stomach).   Eliquis 5 MG Tabs tablet Generic drug: apixaban Take 5 mg by mouth 2 (two) times daily.   fluconazole 200 MG tablet Commonly known as: DIFLUCAN Take 2 tablets (400 mg total) by mouth daily.   gabapentin 300 MG capsule Commonly known as: NEURONTIN Take 1 capsule (300 mg total) by mouth at bedtime.   Lumigan 0.01 % Soln Generic drug: bimatoprost Place 1 drop into both eyes at bedtime.   polyethylene  glycol powder 17 GM/SCOOP powder Commonly known as: GLYCOLAX/MIRALAX Take 17 g by mouth daily as needed for mild constipation.   Saline Flush 0.9 % Soln Flush drainage catheter with 5-10 ml once to two times daily.   tamsulosin 0.4 MG Caps capsule Commonly known as: FLOMAX Take 1 capsule (0.4 mg total) by mouth daily after breakfast.   traMADol 50 MG tablet Commonly known as: ULTRAM Take 1 tablet (50 mg total) by mouth every 6 (six) hours as needed for moderate pain (mild pain).        Follow-up Information     [facility]. Go on 05/27/2022.   Why: PA/LAT CXR to be taken. Please arrive by 2:30 pm Contact information: [place]         [name], MD. Go on 05/27/2022.   Specialty: Cardiothoracic Surgery Why: Appointment time is at 3:45 pm Contact information: [address] [place] [address] [facility]. Go on 05/22/2022.   Specialty: Cardiothoracic Surgery Why: Appointment is with nurse only for chest tube removal. Appointment time is at 11:00 am Contact information: [address] [place], [place] [phone]                Signed:  Nani Skillern, Hershal Coria 05/12/2022, 1:58 PM

## 2022-05-07 NOTE — Anesthesia Procedure Notes (Signed)
Arterial Line Insertion Start/End2/14/2024 8:05 AM, 05/07/2022 8:15 AM Performed by: Lavell Luster, CRNA, CRNA  Patient location: Pre-op. Preanesthetic checklist: patient identified, IV checked, site marked, risks and benefits discussed, surgical consent, monitors and equipment checked, pre-op evaluation and timeout performed Lidocaine 1% used for infiltration and patient sedated Left, radial was placed Catheter size: 20 G Hand hygiene performed , maximum sterile barriers used  and Seldinger technique used Allen's test indicative of satisfactory collateral circulation Attempts: 2 Procedure performed without using ultrasound guided technique. Following insertion, Biopatch. Post procedure complications: local hematoma. Patient tolerated the procedure well with no immediate complications.

## 2022-05-07 NOTE — Transfer of Care (Signed)
Immediate Anesthesia Transfer of Care Note  Patient: Alyssa Peterson  Procedure(s) Performed: VIDEO ASSISTED THORACOSCOPY (VATS)/DECORTICATION (Right: Chest) DRAINAGE OF PLEURAL EFFUSION (Right)  Patient Location: PACU  Anesthesia Type:General  Level of Consciousness: drowsy  Airway & Oxygen Therapy: Patient Spontanous Breathing and Patient connected to nasal cannula oxygen  Post-op Assessment: Report given to RN, Post -op Vital signs reviewed and stable, and Patient moving all extremities X 4  Post vital signs: Reviewed and stable  Last Vitals:  Vitals Value Taken Time  BP 104/77 05/07/22 1100  Temp    Pulse 85 05/07/22 1102  Resp 26 05/07/22 1102  SpO2 100 % 05/07/22 1102  Vitals shown include unvalidated device data.  Last Pain:  Vitals:   05/07/22 0730  TempSrc:   PainSc: 0-No pain         Complications: No notable events documented.

## 2022-05-07 NOTE — Interval H&P Note (Signed)
History and Physical Interval Note:  IVC filter placed last week CXR shows increased right effusion from prior CXR 05/07/2022 8:08 AM  Alyssa Peterson  has presented today for surgery, with the diagnosis of LOCULATED RIGHT PLEURAL EFFUSION.  The various methods of treatment have been discussed with the patient and family. After consideration of risks, benefits and other options for treatment, the patient has consented to  Procedure(s): VIDEO ASSISTED THORACOSCOPY (VATS)/DECORTICATION (Right) DRAINAGE OF PLEURAL EFFUSION (Right) as a surgical intervention.  The patient's history has been reviewed, patient examined, no change in status, stable for surgery.  I have reviewed the patient's chart and labs.  Questions were answered to the patient's satisfaction.     [name]

## 2022-05-07 NOTE — Brief Op Note (Addendum)
05/07/2022  10:34 AM  PATIENT:  Alyssa Peterson  72 y.o. female  PRE-OPERATIVE DIAGNOSIS:  LOCULATED RIGHT PLEURAL EFFUSION  POST-OPERATIVE DIAGNOSIS:  LOCULATED RIGHT PLEURAL EFFUSION  PROCEDURE:  Procedure(s): VIDEO ASSISTED THORACOSCOPY (VATS)/DECORTICATION (Right) DRAINAGE OF PLEURAL EFFUSION (Right)  SURGEON:  Surgeon(s) and Role:    * [name], MD - Primary  PHYSICIAN ASSISTANT: WAYNE GOLD PA-C  ASSISTANTS: none   ANESTHESIA:   general  EBL:  100 mL   BLOOD ADMINISTERED:none  DRAINS: (2 28 F) Blake drain(s) in the RIGHT HEMITHORAX    LOCAL MEDICATIONS USED:  MARCAINE    and OTHER EXPAREL  SPECIMEN:  Source of Specimen:  PLEURAL PEEL AND EFFUSION  DISPOSITION OF SPECIMEN:   PATHOLOGY/MICROBIOLOGY  COUNTS:  YES  TOURNIQUET:  * No tourniquets in log *  DICTATION: .Other Dictation: Dictation Number PENDING  PLAN OF CARE: Admit to inpatient   PATIENT DISPOSITION:  PACU - hemodynamically stable.   Delay start of Pharmacological VTE agent (>24hrs) due to surgical blood loss or risk of bleeding: no  COMPLICATIONS: NO KNOWN

## 2022-05-07 NOTE — Anesthesia Procedure Notes (Signed)
Procedure Name: Intubation Date/Time: 05/07/2022 8:42 AM  Performed by: Kyung Rudd, CRNAPre-anesthesia Checklist: Patient identified, Emergency Drugs available, Suction available and Patient being monitored Patient Re-evaluated:Patient Re-evaluated prior to induction Oxygen Delivery Method: Circle system utilized Preoxygenation: Pre-oxygenation with 100% oxygen Induction Type: IV induction Ventilation: Mask ventilation without difficulty Laryngoscope Size: Mac and 4 Grade View: Grade I Endobronchial tube: Left, Double lumen EBT and EBT position confirmed by auscultation and 37 Fr Number of attempts: 1 Airway Equipment and Method: Stylet Placement Confirmation: ETT inserted through vocal cords under direct vision, positive ETCO2 and breath sounds checked- equal and bilateral Tube secured with: Tape Dental Injury: Teeth and Oropharynx as per pre-operative assessment

## 2022-05-07 NOTE — Anesthesia Postprocedure Evaluation (Signed)
Anesthesia Post Note  Patient: Alyssa Peterson  Procedure(s) Performed: VIDEO ASSISTED THORACOSCOPY (VATS)/DECORTICATION (Right: Chest) DRAINAGE OF PLEURAL EFFUSION (Right)     Patient location during evaluation: PACU Anesthesia Type: General Level of consciousness: awake and alert Pain management: pain level controlled Vital Signs Assessment: post-procedure vital signs reviewed and stable Respiratory status: spontaneous breathing, nonlabored ventilation, respiratory function stable and patient connected to nasal cannula oxygen Cardiovascular status: blood pressure returned to baseline and stable Postop Assessment: no apparent nausea or vomiting Anesthetic complications: no   No notable events documented.  Last Vitals:  Vitals:   05/07/22 1245 05/07/22 1300  BP: 105/80 104/74  Pulse: 82 78  Resp: 20 15  Temp: 36.7 C   SpO2: 99% 99%    Last Pain:  Vitals:   05/07/22 1245  TempSrc: Oral  PainSc: [place]

## 2022-05-07 NOTE — Op Note (Signed)
NAMEZLATY, EMGE MEDICAL RECORD NO: [id number] ACCOUNT NO: [id number] DATE OF BIRTH: Sep 21, 1950 FACILITY: [facility] LOCATION: [facility] PHYSICIAN: Revonda Standard. Roxan Hockey, MD  Operative Report   DATE OF PROCEDURE: 05/07/2022  PREOPERATIVE DIAGNOSIS:  Loculated right pleural effusion.  POSTOPERATIVE DIAGNOSIS:  Loculated right pleural effusion.  PROCEDURE:  Right video-assisted thoracoscopy, drainage of pleural effusion, parietal pleural decortication.  SURGEON:  Revonda Standard. Roxan Hockey, MD  ASSISTANT:  Jadene Pierini, PA.  ANESTHESIA:  General.  FINDINGS:  Loculated effusion with some thin adhesions of the lung to the chest wall.  No frank pus, fluid was murky.  Good reexpansion of all 3 lobes.  CLINICAL NOTE:  Alyssa Peterson is a 72 year old woman who had recently undergone urgent repair of an incarcerated paraesophageal hernia.  She developed a pleural effusion postoperatively.  A drain was placed, but became clogged and nonfunctional and she had a residual effusion.  She was advised to undergo VATS for drainage of the effusion and possible decortication.  The indications, risks, benefits, and alternatives were discussed in detail with the patient.  She understood and accepted the risks and agreed  to proceed.  DESCRIPTION OF PROCEDURE:  Alyssa Peterson was brought to the preoperative holding area on 05/07/2022.  Anesthesia established, intravenous access and placed an arterial blood pressure monitoring line.  She was taken to the operating room, anesthetized, and intubated with a double lumen endotracheal tube.  Intravenous antibiotics were administered.  Foley catheter was placed.  Sequential compression devices were placed on the calves for DVT prophylaxis.  The chest, abdomen and legs were prepped and draped in the usual sterile fashion.  A timeout was performed.  A solution containing 20 mL of liposomal bupivacaine and 30 mL of 0.5% bupivacaine was prepared.  This solution was used for local  at the incision sites.  An incision was made in the ninth interspace anterolaterally and an 11 mm port was inserted.  On initial insertion there was some blood-tinged drainage.  There were adhesions in the area and visualization was limited. A mini thoracotomy incision was made in the seventh interspace and the pleural space was entered.  There was  approximately 800 mL of murky fluid in the chest, loculated into various small collections. There was some exudate, but relatively minimal peel on the lung itself. Adhesions between the lung and the chest wall were taken down circumferentially to ensure  all the fluid have been drained. The plane between the lung and the diaphragm was the most difficult and did require cautery in a couple of locations.  Ultimately, all the fluid was drained.  There appeared to be relatively minimal peel on the visceral pleura and with positive pressure ventilation, there was good reexpansion of the entire lung.  The lung was again deflated.  The chest was copiously irrigated with warm saline.  Inspection of the visceral pleura revealed that there was a peel on it and a plane was developed and this peel was removed.  There was some minor bleeding with removal of the peel and the chest was again copiously irrigated with warm saline. Two 28-French Blake drains were placed, one through the original port incision and then another through a second port incision.  One tube was placed posteriorly and the other was placed anteriorly.  Both were secured with #1 Silk sutures.  Dual lung ventilation was resumed.  Again there was good reexpansion of all 3 lobes on the right.  The working incision then was closed in standard fashion. Chest tubes were  placed to a Pleur-Evac on suction.  The patient was placed back in a supine position.  She was extubated in the operating room and taken to the postanesthetic care unit in good condition.   All sponge, needle and instrument counts were correct at the  end of the procedure.  Experienced assistance was necessary for this case due to surgical complexity.  Jadene Pierini assisted with port placement, camera management, suctioning, retraction and wound closure.   PUS D: 05/07/2022 3:26:57 pm T: 05/07/2022 6:43:00 pm  JOB: [id number]

## 2022-05-07 NOTE — Hospital Course (Addendum)
  HPI: At time of CT surgical consultation  Alyssa Peterson returns for a follow-up of a right pleural effusion after repair of an incarcerated diaphragmatic hernia.   Alyssa Peterson is a 72 year old woman with a history of hypertension, anemia, and gastroesophageal reflux.  She presented on New Year's Day with abdominal pain.  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  She underwent laparoscopic repair by Dr. Windle Guard.  During her postoperative follow-up she was noted to have a large right pleural effusion as well as fluid within the previous hernia sac.  A pigtail catheter was placed.   She had a repeat CT on 04/25/2022.  Her pigtail was not draining any significant amount.  It was removed.  There was still a loculated effusion.  She underwent thoracentesis which drained 1.2 L of fluid.  Repeat CT on 04/28/2022 showed persistent loculated pleural effusion with compressive atelectasis of the right lower lobe.  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   She gets short of breath with even relatively light activities.  Not short of breath at rest.  She is eating well.  The patient and all relevant studies were reviewed by Dr. Roxan Hockey who recommended video-assisted thoracoscopic exploration for decortication and drainage of complex effusion.  Hospital course: The patient was admitted electively and taken to the operating room on 05/07/2022 at which time she underwent right VATS for drainage and decortication of complex effusion.  She tolerated procedure well was taken to the postanesthesia care unit in stable condition. A line and foley were removed early in her post operative course. She was on Lovenox for DVT prophylaxis. Chest tubes remained to suction and there was no air leak. Daily chest x rays were obtained and remained stable. Anterior chest tube was removed on 02/16. She did develop urinary rentention. Bladder scan showed 207 cc.  She was put on Flomax. Urinary retention resolved. UA showed WBC so she was treated with a 3 day course of Levaquin. She was restarted on Augmentin and Flagyl. She is ambulating on room air with good oxygenation. All wounds are clean, dry, healing without signs of infection. She has been tolerating a diet. She is felt surgically stable for discharge.  Gram stain from OR showed or WBC or organisms seen. OR culture showed no growth for 4 days. She was restarted on Apixaban (history of chronic LLL PE) on 02/19. Posterior chest tube was removed on 02/19. Follow up CXR showed minimal right apical pneumothorax and right base atelectasis. She is felt surgically stable for discharge today.

## 2022-05-08 ENCOUNTER — Encounter (HOSPITAL_COMMUNITY): Payer: Self-pay | Admitting: Thoracic Surgery (Cardiothoracic Vascular Surgery)

## 2022-05-08 ENCOUNTER — Inpatient Hospital Stay (HOSPITAL_COMMUNITY): Payer: Medicare Other

## 2022-05-08 LAB — CBC
HCT: 31.4 % — ABNORMAL LOW (ref 36.0–46.0)
Hemoglobin: 10.3 g/dL — ABNORMAL LOW (ref 12.0–15.0)
MCH: 27.4 pg (ref 26.0–34.0)
MCHC: 32.8 g/dL (ref 30.0–36.0)
MCV: 83.5 fL (ref 80.0–100.0)
Platelets: 350 10*3/uL (ref 150–400)
RBC: 3.76 MIL/uL — ABNORMAL LOW (ref 3.87–5.11)
RDW: 14.1 % (ref 11.5–15.5)
WBC: 7.4 10*3/uL (ref 4.0–10.5)
nRBC: 0 % (ref 0.0–0.2)

## 2022-05-08 LAB — BASIC METABOLIC PANEL
Anion gap: 8 (ref 5–15)
BUN: 6 mg/dL — ABNORMAL LOW (ref 8–23)
CO2: 25 mmol/L (ref 22–32)
Calcium: 8 mg/dL — ABNORMAL LOW (ref 8.9–10.3)
Chloride: 101 mmol/L (ref 98–111)
Creatinine, Ser: 0.58 mg/dL (ref 0.44–1.00)
GFR, Estimated: >60 mL/min (ref >60)
Glucose, Bld: 272 mg/dL — ABNORMAL HIGH (ref 70–99)
Potassium: 3.1 mmol/L — ABNORMAL LOW (ref 3.5–5.1)
Sodium: 134 mmol/L — ABNORMAL LOW (ref 135–145)

## 2022-05-08 LAB — SURGICAL PATHOLOGY

## 2022-05-08 MED ORDER — POTASSIUM CHLORIDE CRYS ER 20 MEQ PO TBCR
30.0000 meq | EXTENDED_RELEASE_TABLET | Freq: Three times a day (TID) | ORAL | Status: AC
  Administered 2022-05-08 (×3): 30 meq via ORAL
  Filled 2022-05-08 (×5): qty 1

## 2022-05-08 MED ORDER — KETOROLAC TROMETHAMINE 15 MG/ML IJ SOLN
30.0000 mg | Freq: Four times a day (QID) | INTRAMUSCULAR | Status: AC
  Administered 2022-05-08 – 2022-05-09 (×4): 30 mg via INTRAVENOUS
  Filled 2022-05-08 (×4): qty 2

## 2022-05-08 MED ORDER — FLUCONAZOLE 200 MG PO TABS
400.0000 mg | ORAL_TABLET | Freq: Every day | ORAL | Status: DC
  Administered 2022-05-08 – 2022-05-12 (×5): 400 mg via ORAL
  Filled 2022-05-08 (×5): qty 2

## 2022-05-08 MED ORDER — AMOXICILLIN-POT CLAVULANATE 875-125 MG PO TABS
1.0000 | ORAL_TABLET | Freq: Two times a day (BID) | ORAL | Status: DC
  Administered 2022-05-08 – 2022-05-12 (×9): 1 via ORAL
  Filled 2022-05-08 (×9): qty 1

## 2022-05-08 MED ORDER — GUAIFENESIN ER 600 MG PO TB12
600.0000 mg | ORAL_TABLET | Freq: Two times a day (BID) | ORAL | Status: AC
  Administered 2022-05-08 – 2022-05-12 (×9): 600 mg via ORAL
  Filled 2022-05-08 (×9): qty 1

## 2022-05-08 MED ORDER — GABAPENTIN 300 MG PO CAPS
300.0000 mg | ORAL_CAPSULE | Freq: Two times a day (BID) | ORAL | Status: DC
  Administered 2022-05-08 – 2022-05-12 (×9): 300 mg via ORAL
  Filled 2022-05-08 (×9): qty 1

## 2022-05-08 NOTE — Progress Notes (Addendum)
      [address]       [place] [address]             [phone]       1 Day Post-Op Procedure(s) (LRB): VIDEO ASSISTED THORACOSCOPY (VATS)/DECORTICATION (Right) DRAINAGE OF PLEURAL EFFUSION (Right)  Subjective: Patient eating breakfast this am. She has some pain at chest tube sites.  Objective: Vital signs in last 24 hours: Temp:  [97.6 F (36.4 C)-98.1 F (36.7 C)] 97.7 F (36.5 C) (02/15 0400) Pulse Rate:  [72-99] 72 (02/15 0400) Cardiac Rhythm: Normal sinus rhythm (02/14 1900) Resp:  [12-27] 15 (02/15 0400) BP: (97-112)/(71-80) 101/74 (02/15 0400) SpO2:  [97 %-100 %] 99 % (02/15 0400) Arterial Line BP: (107-111)/(53-57) 109/55 (02/14 1145)      Intake/Output from previous day: 02/14 0701 - 02/15 0700 In: 1370 [P.O.:120; I.V.:1200; IV Piggyback:50] Out: 2110 [Urine:1050; Blood:100; Chest Tube:160]   Physical Exam:  Cardiovascular: RRR Pulmonary: Clear to auscultation on left and slightly diminished right basilar breath sounds. Abdomen: Soft, non tender, bowel sounds present. Extremities: SCDs in place Wounds: Clean and dry.  No erythema or signs of infection. Chest Tubes:to suction, no air leak  Lab Results: CBC: Recent Labs    05/05/22 1500 05/07/22 0750 05/08/22 0014  WBC 5.5  --  7.4  HGB 11.6* 12.6 10.3*  HCT 36.0 37.0 31.4*  PLT 493*  --  350   BMET:  Recent Labs    05/05/22 1500 05/07/22 0750 05/08/22 0014  NA 138 140 134*  K 2.8* 3.2* 3.1*  CL 103 105 101  CO2 23  --  25  GLUCOSE 127* 135* 272*  BUN <5* 4* 6*  CREATININE 0.65 0.60 0.58  CALCIUM 8.4*  --  8.0*    PT/INR:  Recent Labs    05/05/22 1500  LABPROT 15.9*  INR 1.3*   ABG:  INR: Will add last result for INR, ABG once components are confirmed Will add last 4 CBG results once components are confirmed  Assessment/Plan:  1. CV - SR. Will restart Amlodipine once BP improves 2.  Pulmonary - On room air. Chest tube with 160 cc recorded for 12  hours. Pleura Vac marked this am. Chest tube is to suction, no air leak. CXR this am shows right base consolidation (atelectasis, small effusion). Gram stain shows no WBC or organisms. Await pleural fluid culture result. Encourage incentive spirometer. 3. Supplement potassium 4. Anemia-H and H this am 10.3 and 31.4  5. Heplock IVFs and remove foley 6. On Lovenox for DVT prophylaxis 7. Regarding pain control, Fentanyl PRN, Toradol 15 IV Q 6, Oxy PRN. Will increase Toradol to 30 mg (creatinine WNL) for better pain control. Will consider Neurontin if pain not improved  Shalva Rozycki M ZimmermanPA-C 05/08/2022,7:03 AM

## 2022-05-09 ENCOUNTER — Inpatient Hospital Stay (HOSPITAL_COMMUNITY): Payer: Medicare Other

## 2022-05-09 LAB — URINALYSIS, COMPLETE (UACMP) WITH MICROSCOPIC
Glucose, UA: 50 mg/dL — AB
Ketones, ur: 5 mg/dL — AB
Nitrite: NEGATIVE
Protein, ur: 30 mg/dL — AB
RBC / HPF: >50 RBC/hpf (ref 0–5)
Specific Gravity, Urine: 1.028 (ref 1.005–1.030)
pH: 5 (ref 5.0–8.0)

## 2022-05-09 LAB — CBC
HCT: 33.7 % — ABNORMAL LOW (ref 36.0–46.0)
Hemoglobin: 10.5 g/dL — ABNORMAL LOW (ref 12.0–15.0)
MCH: 26.8 pg (ref 26.0–34.0)
MCHC: 31.2 g/dL (ref 30.0–36.0)
MCV: 86 fL (ref 80.0–100.0)
Platelets: 433 10*3/uL — ABNORMAL HIGH (ref 150–400)
RBC: 3.92 MIL/uL (ref 3.87–5.11)
RDW: 14.4 % (ref 11.5–15.5)
WBC: 14.4 10*3/uL — ABNORMAL HIGH (ref 4.0–10.5)
nRBC: 0 % (ref 0.0–0.2)

## 2022-05-09 LAB — COMPREHENSIVE METABOLIC PANEL
ALT: 40 U/L (ref 0–44)
AST: 44 U/L — ABNORMAL HIGH (ref 15–41)
Albumin: 2 g/dL — ABNORMAL LOW (ref 3.5–5.0)
Alkaline Phosphatase: 98 U/L (ref 38–126)
Anion gap: 8 (ref 5–15)
BUN: 13 mg/dL (ref 8–23)
CO2: 25 mmol/L (ref 22–32)
Calcium: 8.8 mg/dL — ABNORMAL LOW (ref 8.9–10.3)
Chloride: 103 mmol/L (ref 98–111)
Creatinine, Ser: 0.97 mg/dL (ref 0.44–1.00)
GFR, Estimated: >60 mL/min (ref >60)
Glucose, Bld: 142 mg/dL — ABNORMAL HIGH (ref 70–99)
Potassium: 4.4 mmol/L (ref 3.5–5.1)
Sodium: 136 mmol/L (ref 135–145)
Total Bilirubin: <0.1 mg/dL — ABNORMAL LOW (ref 0.3–1.2)
Total Protein: 5.7 g/dL — ABNORMAL LOW (ref 6.5–8.1)

## 2022-05-09 MED ORDER — TAMSULOSIN HCL 0.4 MG PO CAPS
0.4000 mg | ORAL_CAPSULE | Freq: Every day | ORAL | Status: DC
  Administered 2022-05-09 – 2022-05-12 (×4): 0.4 mg via ORAL
  Filled 2022-05-09 (×4): qty 1

## 2022-05-09 NOTE — Progress Notes (Signed)
Pt has no urine output for 12 hrs. Bladder scan done with result =207 cc. Pt has no C/O discomfort at this time. Will continue to monitor pts output.

## 2022-05-09 NOTE — Care Management Important Message (Signed)
Important Message  Patient Details  Name: Alyssa Peterson MRN: [id number] Date of Birth: Sep 04, 1950   Medicare Important Message Given:  Yes     [name] Montine Circle 05/09/2022, 2:01 PM

## 2022-05-09 NOTE — Progress Notes (Signed)
Mobility Specialist Progress Note:   05/09/22 0955  Mobility  Activity Ambulated with assistance in hallway  Level of Assistance Contact guard assist, steadying assist  Assistive Device Front wheel walker  Distance Ambulated (ft) 260 ft  Activity Response Tolerated well  $Mobility charge 1 Mobility   During Mobility: 121 HR; 93% SpO2 Post Mobility:  75 HR; 98% SpO2  Pt in bed willing to participate in mobility. No complaints of pain. Left in bed with call bell in reach and all needs met.   Gareth Eagle Alyssa Peterson Mobility Specialist Please contact via [employer] or  Rehab Office at [phone]

## 2022-05-09 NOTE — Progress Notes (Addendum)
      [address]       [place] [address]             [phone]       2 Days Post-Op Procedure(s) (LRB): VIDEO ASSISTED THORACOSCOPY (VATS)/DECORTICATION (Right) DRAINAGE OF PLEURAL EFFUSION (Right)  Subjective: Patient eating breakfast this am. Patient states she has not urinated for over 12 hours. She feels as though she can but "nothing comes out".  Objective: Vital signs in last 24 hours: Temp:  [97.7 F (36.5 C)-98.2 F (36.8 C)] 97.9 F (36.6 C) (02/16 0325) Pulse Rate:  [65-78] 65 (02/16 0400) Cardiac Rhythm: Ventricular tachycardia (02/16 0442) Resp:  [12-21] 17 (02/16 0400) BP: (98-112)/(68-85) 103/73 (02/16 0400) SpO2:  [97 %-100 %] 97 % (02/16 0400)      Intake/Output from previous day: 02/15 0701 - 02/16 0700 In: 360 [P.O.:360] Out: 400 [Urine:300; Chest Tube:100]   Physical Exam:  Cardiovascular: RRR Pulmonary: Clear to auscultation on left and slightly diminished right basilar breath sounds. Abdomen: Soft, non tender, bowel sounds present. Extremities: SCDs in place Wounds: Clean and dry.  No erythema or signs of infection. Chest Tubes:to suction, no air leak  Lab Results: CBC: Recent Labs    05/08/22 0014 05/09/22 0034  WBC 7.4 14.4*  HGB 10.3* 10.5*  HCT 31.4* 33.7*  PLT 350 433*    BMET:  Recent Labs    05/08/22 0014 05/09/22 0034  NA 134* 136  K 3.1* 4.4  CL 101 103  CO2 25 25  GLUCOSE 272* 142*  BUN 6* 13  CREATININE 0.58 0.97  CALCIUM 8.0* 8.8*     PT/INR:  No results for input(s): "LABPROT", "INR" in the last 72 hours.  ABG:  INR: Will add last result for INR, ABG once components are confirmed Will add last 4 CBG results once components are confirmed  Assessment/Plan:  1. CV - SR. Will restart Amlodipine once BP improves. Will restart Apixaban (for chronic PE) likely when chest tubes removed. 2.  Pulmonary - On room air. Chest tube with 100 cc recorded for 24 hours. But by my mark yesterday,  160 cc.  Chest tube is to suction, no air leak. CXR this am shows right base consolidation perhaps slightly improved (atelectasis, small effusion). Hope to remove 1 chest tube soon. Gram stain shows no WBC or organisms. Pleural fluid culture shows no growth < 24 hours. Encourage incentive spirometer. 3. Urinary retention-bladder scan showed 207. Will try Flomax. May need to re insert foley. Check UA. 4. Anemia-H and H this am stable at 10.5 and 33.7 5. Heplock IVFs and remove foley 6. On Lovenox for DVT prophylaxis 7. Regarding pain control, Fentanyl PRN, Toradol 15 IV Q 6, Oxy PRN. Will increase Toradol to 30 mg (creatinine WNL) for better pain control. Will consider Neurontin if pain not improved 8. ID-on Augmentin  Donielle M ZimmermanPA-C 05/09/2022,7:05 AM  Patient seen and examined, agree with above Minimal output from CT- dc anterior tube today CXR shows persistent atelectasis and a small effusion vs pleural thickening Ambulate Hopefully can get remaining tube out over the weekend and home Sunday/ Monday  Remo Lipps C. Roxan Hockey, MD [facility] [phone]

## 2022-05-10 ENCOUNTER — Inpatient Hospital Stay (HOSPITAL_COMMUNITY): Payer: Medicare Other

## 2022-05-10 MED ORDER — LEVOFLOXACIN 500 MG PO TABS
500.0000 mg | ORAL_TABLET | Freq: Every day | ORAL | Status: AC
  Administered 2022-05-10 – 2022-05-12 (×3): 500 mg via ORAL
  Filled 2022-05-10 (×3): qty 1

## 2022-05-10 MED ORDER — POLYETHYLENE GLYCOL 3350 17 G PO PACK
17.0000 g | PACK | Freq: Once | ORAL | Status: AC
  Administered 2022-05-10: 17 g via ORAL
  Filled 2022-05-10: qty 1

## 2022-05-10 NOTE — Progress Notes (Signed)
Mobility Specialist: Progress Note   05/10/22 1644  Mobility  Activity Ambulated with assistance in hallway  Level of Assistance Minimal assist, patient does 75% or more  Assistive Device Front wheel walker  Distance Ambulated (ft) 280 ft  Activity Response Tolerated well  Mobility Referral Yes  $Mobility charge 1 Mobility   Pre-Mobility: 91 HR, 98% SpO2 Post-Mobility: 102 HR, 97% SpO2  Pt received in the bed and agreeable to mobility. MinA with bed mobility and contact guard during ambulation for balance. LOB x1 requiring minA to correct. Pt veering Lt throughout session as well. Pt back to bed after ambulation with call bell and phone at her side.   Waller Adeliz Tonkinson Mobility Specialist Please contact via [employer] or Rehab office at [phone]

## 2022-05-10 NOTE — Progress Notes (Signed)
      [address]       [place] [address]             [phone]       3 Days Post-Op Procedure(s) (LRB): VIDEO ASSISTED THORACOSCOPY (VATS)/DECORTICATION (Right) DRAINAGE OF PLEURAL EFFUSION (Right)  Subjective: Up in the bedside chair, states she had some right-sided chest soreness getting up but it has since gone away and she is now comfortable.  She said she is voiding without any difficulty now.  Objective: Vital signs in last 24 hours: Temp:  [97.8 F (36.6 C)-98.2 F (36.8 C)] 98.1 F (36.7 C) (02/17 0745) Pulse Rate:  [72-92] 80 (02/17 0745) Cardiac Rhythm: Normal sinus rhythm (02/17 0704) Resp:  [10-20] 12 (02/17 0409) BP: (99-114)/(72-87) 111/80 (02/17 0745) SpO2:  [97 %-99 %] 97 % (02/17 0745)      Intake/Output from previous day: 02/16 0701 - 02/17 0700 In: 2509 [P.O.:1320; I.V.:1189] Out: 1110 [Urine:1050; Chest Tube:60]   Physical Exam:  Cardiovascular: RRR Pulmonary: Clear to auscultation on left and slightly diminished right basilar breath sounds.  Chest x-ray is demonstrating improved aeration in the right base Abdomen: Soft, non tender, bowel sounds present. Extremities: Fused, no edema Wounds: Clean and dry. Chest Tubes:t on waterseal, no air leak.  Awake only 60 mL of drainage recorded for past 24 hours but the entire length of the tubing from the chest tube to the Pleur-evac was full of fluid at the time of this exam.  Lab Results: CBC: Recent Labs    05/08/22 0014 05/09/22 0034  WBC 7.4 14.4*  HGB 10.3* 10.5*  HCT 31.4* 33.7*  PLT 350 433*    BMET:  Recent Labs    05/08/22 0014 05/09/22 0034  NA 134* 136  K 3.1* 4.4  CL 101 103  CO2 25 25  GLUCOSE 272* 142*  BUN 6* 13  CREATININE 0.58 0.97  CALCIUM 8.0* 8.8*      EXAM: PORTABLE CHEST 1 VIEW   COMPARISON:  05/09/2022   FINDINGS: Right apical chest tube. No pneumothorax identified. There is alveolar consolidation or volume loss at the right base.  Right-sided small pleural effusion. Left lung clear. Normal pulmonary vasculature. Unremarkable cardiac silhouette.   IMPRESSION: Right base consolidation or volume loss and effusion.     Electronically Signed   By: Sammie Bench M.D.   On: 05/10/2022 08:25  Assessment/Plan:  1. CV - SR. blood pressure is ranging from 123456 systolic, will hold off on restarting the amlodipine. Will restart Apixaban (for chronic PE)  when chest tubes removed. 2.  Pulmonary - On room air.  1 chest tube remains which was found to be filled with fluid at the time of this evaluation.  Will leave drain in for another 24 hours Gram stain shows no WBC or organisms. Pleural fluid culture shows no growth after 48 hours.  Continue oral Augmentin.  Encourage incentive spirometer and flutter valve. 3. Urinary retention-resolved.  UA was sent yesterday which is nitrate negative but had 20-50 white cells per high-power field.  No culture was done.  Will treat empirically with oral Levaquin for 3 days. 4. Anemia-hematocrit has been stable  5. On Lovenox for DVT prophylaxis -Disposition plan for chest tube removal tomorrow and anticipate discharge to home on Monday.  Arlis Porta RoddenberryPA-C [phone] 05/10/2022,10:53 AM

## 2022-05-11 ENCOUNTER — Inpatient Hospital Stay (HOSPITAL_COMMUNITY): Payer: Medicare Other

## 2022-05-11 LAB — BASIC METABOLIC PANEL
Anion gap: 6 (ref 5–15)
BUN: 12 mg/dL (ref 8–23)
CO2: 25 mmol/L (ref 22–32)
Calcium: 8.4 mg/dL — ABNORMAL LOW (ref 8.9–10.3)
Chloride: 104 mmol/L (ref 98–111)
Creatinine, Ser: 0.64 mg/dL (ref 0.44–1.00)
GFR, Estimated: >60 mL/min (ref >60)
Glucose, Bld: 118 mg/dL — ABNORMAL HIGH (ref 70–99)
Potassium: 4.1 mmol/L (ref 3.5–5.1)
Sodium: 135 mmol/L (ref 135–145)

## 2022-05-11 NOTE — Progress Notes (Signed)
Mobility Specialist: Progress Note   05/11/22 1728  Mobility  Activity Ambulated with assistance in hallway  Level of Assistance Minimal assist, patient does 75% or more  Assistive Device Front wheel walker  Distance Ambulated (ft) 340 ft  Activity Response Tolerated well  Mobility Referral Yes  $Mobility charge 1 Mobility   Pre-Mobility: 92 HR, 98% SpO2 Post-Mobility: 101 HR, 99% SpO2  Pt received in the bed and agreeable to mobility. Mod I with bed mobility as well as to stand. LOB x1 during ambulation when turning requiring minA to correct. No c/o throughout. Pt back to bed after session with call bell and phone in reach.   Vermont Linton Stolp Mobility Specialist Please contact via [employer] or Rehab office at [phone]

## 2022-05-11 NOTE — Progress Notes (Signed)
[address]       [place] [address]             [phone]       4 Days Post-Op Procedure(s) (LRB): VIDEO ASSISTED THORACOSCOPY (VATS)/DECORTICATION (Right) DRAINAGE OF PLEURAL EFFUSION (Right)  Subjective: Up in the bedside chair.     Objective: Vital signs in last 24 hours: Temp:  [97.7 F (36.5 C)-98.1 F (36.7 C)] 98 F (36.7 C) (02/18 0811) Pulse Rate:  [78-93] 88 (02/18 0811) Cardiac Rhythm: Normal sinus rhythm (02/18 0721) Resp:  [12-18] 12 (02/18 0811) BP: (105-116)/(80-92) 105/81 (02/18 0811) SpO2:  [97 %-98 %] 98 % (02/18 0811)      Intake/Output from previous day: 02/17 0701 - 02/18 0700 In: 1210.3 [P.O.:960; I.V.:250.3] Out: 680 [Urine:200; Chest Tube:480]  CLINICAL DATA:  Pleural effusion.  Chest tube in place.   EXAM: PORTABLE CHEST 1 VIEW   COMPARISON:  Single-view of the chest 05/10/2022 and 05/09/2022.   FINDINGS: Single right chest tube remains in place, unchanged in position. No pneumothorax. Small right pleural effusion and basilar atelectasis persist. Aeration appears mildly improved. Left lung is expanded and clear. Heart size is normal. No acute focal bony abnormality.   IMPRESSION: 1. Right chest tube unchanged in position. Negative for pneumothorax. 2. Small right pleural effusion with mild improvement in aeration in the right lung base appears mildly improved.     Electronically Signed   By: Inge Rise M.D.   On: 05/11/2022 08:47  Physical Exam:  Cardiovascular: RRR Pulmonary: Clear to auscultation on left and slightly diminished right basilar breath sounds.  Chest x-ray is demonstrating continued improvement in aeration at the right base Abdomen: Soft, non tender, bowel sounds present. Extremities: well perfused, no edema Wounds: Clean and dry. Chest Tubes: chest tube on waterseal, no air leak.  Had 443m drainage from the chest tube past 24 hours.  Lab Results: CBC: Recent Labs     05/09/22 0034  WBC 14.4*  HGB 10.5*  HCT 33.7*  PLT 433*    BMET:  Recent Labs    05/09/22 0034 05/11/22 0018  NA 136 135  K 4.4 4.1  CL 103 104  CO2 25 25  GLUCOSE 142* 118*  BUN 13 12  CREATININE 0.97 0.64  CALCIUM 8.8* 8.4*      EXAM: PORTABLE CHEST 1 VIEW   COMPARISON:  05/09/2022   FINDINGS: Right apical chest tube. No pneumothorax identified. There is alveolar consolidation or volume loss at the right base. Right-sided small pleural effusion. Left lung clear. Normal pulmonary vasculature. Unremarkable cardiac silhouette.   IMPRESSION: Right base consolidation or volume loss and effusion.     Electronically Signed   By: JSammie BenchM.D.   On: 05/10/2022 08:25  Assessment/Plan:  1. CV - SR. blood pressure controlled and stable,  will hold off on restarting the amlodipine. Will restart Apixaban (for chronic PE)  when chest tubes removed. 2.  Pulmonary - On room air.  1 chest tube remains with 4881mdrainage for past 24 hours.   Will leave drain for now.  Gram stain shows no WBC or organisms. Pleural fluid culture shows no growth after 72 hours.  Continue oral Augmentin.  Encourage incentive spirometer and flutter valve. 3. Urinary retention-resolved.  UA was sent yesterday which is nitrate negative but had 20-50 white cells per high-power field.  No culture was done.  Will treat empirically with oral Levaquin for 3 days (last dose Monday).  4. Anemia-hematocrit has been stable, follow up lab in AM. 5. On Lovenox for DVT prophylaxis -Disposition plan for possible chest tube removal tomorrow if drainage subsides. Possible discharge tomorrow afternoon or Tuesday.     Alyssa Peterson [phone] 05/11/2022,11:01 AM

## 2022-05-11 NOTE — Plan of Care (Signed)
  Problem: Education: Goal: Understanding of CV disease, CV risk reduction, and recovery process will improve Outcome: Progressing Goal: Individualized Educational Video(s) Outcome: Progressing   Problem: Activity: Goal: Ability to return to baseline activity level will improve Outcome: Progressing   Problem: Cardiovascular: Goal: Ability to achieve and maintain adequate cardiovascular perfusion will improve Outcome: Progressing Goal: Vascular access site(s) Level 0-1 will be maintained Outcome: Progressing   Problem: Health Behavior/Discharge Planning: Goal: Ability to safely manage health-related needs after discharge will improve Outcome: Progressing   Problem: Education: Goal: Knowledge of disease or condition will improve Outcome: Progressing Goal: Knowledge of the prescribed therapeutic regimen will improve Outcome: Progressing   Problem: Activity: Goal: Risk for activity intolerance will decrease Outcome: Progressing   Problem: Cardiac: Goal: Will achieve and/or maintain hemodynamic stability Outcome: Progressing   Problem: Clinical Measurements: Goal: Postoperative complications will be avoided or minimized Outcome: Progressing   Problem: Respiratory: Goal: Respiratory status will improve Outcome: Progressing   Problem: Pain Management: Goal: Pain level will decrease Outcome: Progressing   Problem: Skin Integrity: Goal: Wound healing without signs and symptoms infection will improve Outcome: Progressing   Problem: Education: Goal: Knowledge of General Education information will improve Description: Including pain rating scale, medication(s)/side effects and non-pharmacologic comfort measures Outcome: Progressing   Problem: Health Behavior/Discharge Planning: Goal: Ability to manage health-related needs will improve Outcome: Progressing   Problem: Clinical Measurements: Goal: Ability to maintain clinical measurements within normal limits will  improve Outcome: Progressing Goal: Will remain free from infection Outcome: Progressing Goal: Diagnostic test results will improve Outcome: Progressing Goal: Respiratory complications will improve Outcome: Progressing Goal: Cardiovascular complication will be avoided Outcome: Progressing   Problem: Activity: Goal: Risk for activity intolerance will decrease Outcome: Progressing   Problem: Nutrition: Goal: Adequate nutrition will be maintained Outcome: Progressing   Problem: Coping: Goal: Level of anxiety will decrease Outcome: Progressing   Problem: Elimination: Goal: Will not experience complications related to bowel motility Outcome: Progressing Goal: Will not experience complications related to urinary retention Outcome: Progressing   Problem: Pain Managment: Goal: General experience of comfort will improve Outcome: Progressing   Problem: Safety: Goal: Ability to remain free from injury will improve Outcome: Progressing   Problem: Skin Integrity: Goal: Risk for impaired skin integrity will decrease Outcome: Progressing

## 2022-05-12 ENCOUNTER — Inpatient Hospital Stay (HOSPITAL_COMMUNITY): Payer: Medicare Other

## 2022-05-12 LAB — AEROBIC/ANAEROBIC CULTURE W GRAM STAIN (SURGICAL/DEEP WOUND)
Culture: NO GROWTH
Culture: NO GROWTH
Culture: NO GROWTH
Gram Stain: NONE SEEN
Gram Stain: NONE SEEN
Gram Stain: NONE SEEN
Special Requests: 2

## 2022-05-12 LAB — BASIC METABOLIC PANEL
Anion gap: 7 (ref 5–15)
BUN: 11 mg/dL (ref 8–23)
CO2: 25 mmol/L (ref 22–32)
Calcium: 8.3 mg/dL — ABNORMAL LOW (ref 8.9–10.3)
Chloride: 104 mmol/L (ref 98–111)
Creatinine, Ser: 0.68 mg/dL (ref 0.44–1.00)
GFR, Estimated: >60 mL/min (ref >60)
Glucose, Bld: 93 mg/dL (ref 70–99)
Potassium: 3.9 mmol/L (ref 3.5–5.1)
Sodium: 136 mmol/L (ref 135–145)

## 2022-05-12 LAB — CBC
HCT: 31.1 % — ABNORMAL LOW (ref 36.0–46.0)
Hemoglobin: 9.6 g/dL — ABNORMAL LOW (ref 12.0–15.0)
MCH: 27 pg (ref 26.0–34.0)
MCHC: 30.9 g/dL (ref 30.0–36.0)
MCV: 87.4 fL (ref 80.0–100.0)
Platelets: 294 10*3/uL (ref 150–400)
RBC: 3.56 MIL/uL — ABNORMAL LOW (ref 3.87–5.11)
RDW: 14.8 % (ref 11.5–15.5)
WBC: 4.4 10*3/uL (ref 4.0–10.5)
nRBC: 0 % (ref 0.0–0.2)

## 2022-05-12 MED ORDER — TRAMADOL HCL 50 MG PO TABS: 50.0000 mg | ORAL_TABLET | Freq: Four times a day (QID) | ORAL | 0 refills | Status: DC | PRN

## 2022-05-12 MED ORDER — ENSURE ENLIVE PO LIQD: 237.0000 mL | Freq: Two times a day (BID) | ORAL | Status: DC

## 2022-05-12 MED ORDER — AMLODIPINE BESYLATE 10 MG PO TABS: 10.0000 mg | ORAL_TABLET | Freq: Every day | ORAL | Status: DC

## 2022-05-12 MED ORDER — TAMSULOSIN HCL 0.4 MG PO CAPS: 0.4000 mg | ORAL_CAPSULE | Freq: Every day | ORAL | 0 refills | Status: DC

## 2022-05-12 MED ORDER — POTASSIUM CHLORIDE CRYS ER 20 MEQ PO TBCR
20.0000 meq | EXTENDED_RELEASE_TABLET | Freq: Once | ORAL | Status: AC
  Administered 2022-05-12: 20 meq via ORAL
  Filled 2022-05-12: qty 1

## 2022-05-12 MED ORDER — GABAPENTIN 300 MG PO CAPS: 300.0000 mg | ORAL_CAPSULE | Freq: Every day | ORAL | 0 refills | Status: DC

## 2022-05-12 MED ORDER — SALINE FLUSH 0.9 % IV SOLN: 5.0000 mL | Freq: Two times a day (BID) | INTRAVENOUS | 0 refills | Status: DC

## 2022-05-12 NOTE — Progress Notes (Signed)
Initial Nutrition Assessment  DOCUMENTATION CODES:   Severe malnutrition in context of chronic illness  INTERVENTION:  - Add Ensure Enlive po BID, each supplement provides 350 kcal and 20 grams of protein.  NUTRITION DIAGNOSIS:   Severe Malnutrition related to chronic illness as evidenced by severe fat depletion, severe muscle depletion.  GOAL:   Patient will meet greater than or equal to 90% of their needs  MONITOR:   PO intake  REASON FOR ASSESSMENT:   Malnutrition Screening Tool    ASSESSMENT:   72 y.o. female admits related to surgical intervention. PMH includes: anemia, GERD, HTN, inguinal hernia. Pt is currently receiving medical management related to loculated pleural effusion.  Meds reviewed: senokot. Labs reviewed.   The pt reports that she has been eating well since admission. She states that she did not have a good appetite for a little while PTA. No significant wt loss per record. Pt agreed to try Ensure BID. RD will add supplements and continue to monitor PO intakes.   NUTRITION - FOCUSED PHYSICAL EXAM:  Flowsheet Row Most Recent Value  Orbital Region Severe depletion  Upper Arm Region Moderate depletion  Thoracic and Lumbar Region Moderate depletion  Buccal Region Moderate depletion  Temple Region Severe depletion  Clavicle Bone Region Severe depletion  Clavicle and Acromion Bone Region Severe depletion  Scapular Bone Region Moderate depletion  Dorsal Hand Moderate depletion  Patellar Region Moderate depletion  Anterior Thigh Region Moderate depletion  Posterior Calf Region Moderate depletion  Edema (RD Assessment) None  Hair Reviewed  Eyes Reviewed  Mouth Reviewed  Skin Reviewed  Nails Reviewed       Diet Order:   Diet Order             Diet regular Room service appropriate? Yes; Fluid consistency: Thin  Diet effective now                   EDUCATION NEEDS:   Not appropriate for education at this time  Skin:  Skin Assessment:  Skin Integrity Issues: Skin Integrity Issues:: Incisions Incisions: right chest  Last BM:  05/11/22  Height:   Ht Readings from Last 1 Encounters:  05/07/22 5' 3"$  (1.6 m)    Weight:   Wt Readings from Last 1 Encounters:  05/07/22 50.3 kg    Ideal Body Weight:     BMI:  Body mass index is 19.66 kg/m.  Estimated Nutritional Needs:   Kcal:  1500-1760 kcals  Protein:  75-90 gm  Fluid:  >/= 1.5 L  Thalia Bloodgood, RD, LDN, CNSC.

## 2022-05-12 NOTE — Progress Notes (Addendum)
      [address]       [place] [address]             [phone]       5 Days Post-Op Procedure(s) (LRB): VIDEO ASSISTED THORACOSCOPY (VATS)/DECORTICATION (Right) DRAINAGE OF PLEURAL EFFUSION (Right)  Subjective: Patient eating a banana. She has complaints of loose stool.  Objective: Vital signs in last 24 hours: Temp:  [97.8 F (36.6 C)-98.6 F (37 C)] 97.8 F (36.6 C) (02/19 0300) Pulse Rate:  [71-110] 71 (02/19 0300) Cardiac Rhythm: Normal sinus rhythm (02/18 1932) Resp:  [12-20] 16 (02/19 0300) BP: (97-111)/(73-85) 109/75 (02/19 0300) SpO2:  [97 %-99 %] 99 % (02/19 0300)     Intake/Output from previous day: 02/18 0701 - 02/19 0700 In: -  Out: 30 [Chest Tube:30]   Physical Exam:  Cardiovascular: RRR Pulmonary: Clear to auscultation on left and slightly diminished right basilar breath sounds. Abdomen: Soft, non tender, bowel sounds present. Extremities: SCDs in place Wounds: Clean and dry.  No erythema or signs of infection. Chest Tube :to suction, no water seal  Lab Results: CBC: Recent Labs    05/12/22 0023  WBC 4.4  HGB 9.6*  HCT 31.1*  PLT 294    BMET:  Recent Labs    05/11/22 0018 05/12/22 0023  NA 135 136  K 4.1 3.9  CL 104 104  CO2 25 25  GLUCOSE 118* 93  BUN 12 11  CREATININE 0.64 0.68  CALCIUM 8.4* 8.3*     PT/INR:  No results for input(s): "LABPROT", "INR" in the last 72 hours.  ABG:  INR: Will add last result for INR, ABG once components are confirmed Will add last 4 CBG results once components are confirmed  Assessment/Plan:  1. CV - SR. Will restart Amlodipine once BP improves 2.  Pulmonary - On room air. Chest tube with 30 cc recorded for 12 hours. Pleura Vac marked this am. Chest tube is to water seal no air leak. CXR this am shows patient is rotated to the left, right base consolidation (atelectasis, small effusion). Likely remove chest tube. Gram stain shows no WBC or organisms. Pleural fluid  culture result shows no growth last 4 days.. Encourage incentive spirometer. 3. Anemia-H and H this am 9.6 and 31.1 4. On Lovenox for DVT prophylaxis 5. ID-on oral Levaquin empirically for WBCs in UA. Last dose today. 6. Stop stool softeners 7. Recheck CXR in am;if CXR stable, possible discharge in am  Donielle M ZimmermanPA-C 05/12/2022,6:59 AM  Minimal output form CT- dc  Possibly home later today  Remo Lipps C. Roxan Hockey, MD [facility] [phone]

## 2022-05-12 NOTE — Progress Notes (Signed)
Mobility Specialist Progress Note:   05/12/22 1043  Mobility  Activity Ambulated with assistance in hallway  Level of Assistance Contact guard assist, steadying assist  Assistive Device Front wheel walker  Distance Ambulated (ft) 340 ft  Activity Response Tolerated well  $Mobility charge 1 Mobility   Pre- Mobility:  86 HR; 98% SpO2 During Mobility:95 HR;  94% SpO2 Post Mobility:  88  HR; 97% SpO2  Pt in bed willing to participate in mobility. No complaints of pain. Left in bed with call bell in reach and all needs met.   Gareth Eagle Earnesteen Birnie Mobility Specialist Please contact via [facility] or  Rehab Office at [phone]

## 2022-05-13 ENCOUNTER — Telehealth: Payer: Self-pay

## 2022-05-13 NOTE — Transitions of Care (Post Inpatient/ED Visit) (Signed)
   05/13/2022  Name: Alyssa Peterson MRN: [id number] DOB: 26-Oct-1950  Today's TOC FU Call Status: Today's TOC FU Call Status:: Successful TOC FU Call Competed TOC FU Call Complete Date: 05/13/22  Transition Care Management Follow-up Telephone Call Date of Discharge: 05/12/22 Discharge Facility: [facility]) Type of Discharge: Inpatient Admission Primary Inpatient Discharge Diagnosis:: pleural effusion How have you been since you were released from the hospital?: Better Any questions or concerns?: No  Items Reviewed: Did you receive and understand the discharge instructions provided?: Yes Medications obtained and verified?: Yes (Medications Reviewed) (She is not using the saline flushes any longer) Any new allergies since your discharge?: No Dietary orders reviewed?: Yes Type of Diet Ordered:: heart healthy Do you have support at home?: Yes People in Home: other relative(s) Name of Support/Comfort Primary Source: ex-husband  Home Care and Equipment/Supplies: [place] Ordered?: No Any new equipment or medical supplies ordered?: No  Functional Questionnaire: Do you need assistance with bathing/showering or dressing?: No (she stated that she still has a drain in her abdomen but it is clamped and she does not need to flush it. She has been using her incentive spirometer) Do you need assistance with meal preparation?: No Do you need assistance with eating?: No Do you have difficulty maintaining continence: No Do you need assistance with getting out of bed/getting out of a chair/moving?: Yes (she has a RW to use with ambulation.) Do you have difficulty managing or taking your medications?: No  Folllow up appointments reviewed: PCP Follow-up appointment confirmed?: Yes Date of PCP follow-up appointment?: 06/06/22 Follow-up Provider: Geryl Rankins, NP Specialist Hospital Follow-up appointment confirmed?: Yes Date of Specialist follow-up appointment?:  05/22/22 Follow-Up Specialty Provider:: CTS , in addition to 2/29/204 staple/ suture removal , she has an appointment 05/27/2022 with Dr Roxan Hockey Do you need transportation to your follow-up appointment?: No Do you understand care options if your condition(s) worsen?: Yes-patient verbalized understanding    SIGNATURE  Eden Lathe, RN

## 2022-05-14 DIAGNOSIS — Z431 Encounter for attention to gastrostomy: Secondary | ICD-10-CM | POA: Diagnosis not present

## 2022-05-14 DIAGNOSIS — K219 Gastro-esophageal reflux disease without esophagitis: Secondary | ICD-10-CM | POA: Diagnosis not present

## 2022-05-14 DIAGNOSIS — Z9181 History of falling: Secondary | ICD-10-CM | POA: Diagnosis not present

## 2022-05-14 DIAGNOSIS — T8143XA Infection following a procedure, organ and space surgical site, initial encounter: Secondary | ICD-10-CM | POA: Diagnosis not present

## 2022-05-14 DIAGNOSIS — E43 Unspecified severe protein-calorie malnutrition: Secondary | ICD-10-CM | POA: Diagnosis not present

## 2022-05-14 DIAGNOSIS — D630 Anemia in neoplastic disease: Secondary | ICD-10-CM | POA: Diagnosis not present

## 2022-05-14 DIAGNOSIS — B3789 Other sites of candidiasis: Secondary | ICD-10-CM | POA: Diagnosis not present

## 2022-05-14 DIAGNOSIS — Z4803 Encounter for change or removal of drains: Secondary | ICD-10-CM | POA: Diagnosis not present

## 2022-05-14 DIAGNOSIS — I10 Essential (primary) hypertension: Secondary | ICD-10-CM | POA: Diagnosis not present

## 2022-05-14 DIAGNOSIS — J9811 Atelectasis: Secondary | ICD-10-CM | POA: Diagnosis not present

## 2022-05-14 DIAGNOSIS — J90 Pleural effusion, not elsewhere classified: Secondary | ICD-10-CM | POA: Diagnosis not present

## 2022-05-14 DIAGNOSIS — Z792 Long term (current) use of antibiotics: Secondary | ICD-10-CM | POA: Diagnosis not present

## 2022-05-17 LAB — TYPE AND SCREEN
ABO/RH(D): O POS
Antibody Screen: POSITIVE
DAT, IgG: POSITIVE
Donor AG Type: NEGATIVE
Donor AG Type: NEGATIVE
Unit division: 0
Unit division: 0

## 2022-05-17 LAB — BPAM RBC
Blood Product Expiration Date: 202403132359
Blood Product Expiration Date: 202403132359
ISSUE DATE / TIME: 202402071756
Unit Type and Rh: 5100
Unit Type and Rh: 5100

## 2022-05-21 DIAGNOSIS — D630 Anemia in neoplastic disease: Secondary | ICD-10-CM | POA: Diagnosis not present

## 2022-05-21 DIAGNOSIS — B3789 Other sites of candidiasis: Secondary | ICD-10-CM | POA: Diagnosis not present

## 2022-05-21 DIAGNOSIS — K219 Gastro-esophageal reflux disease without esophagitis: Secondary | ICD-10-CM | POA: Diagnosis not present

## 2022-05-21 DIAGNOSIS — J90 Pleural effusion, not elsewhere classified: Secondary | ICD-10-CM | POA: Diagnosis not present

## 2022-05-21 DIAGNOSIS — E43 Unspecified severe protein-calorie malnutrition: Secondary | ICD-10-CM | POA: Diagnosis not present

## 2022-05-21 DIAGNOSIS — Z431 Encounter for attention to gastrostomy: Secondary | ICD-10-CM | POA: Diagnosis not present

## 2022-05-21 DIAGNOSIS — J9811 Atelectasis: Secondary | ICD-10-CM | POA: Diagnosis not present

## 2022-05-21 DIAGNOSIS — I10 Essential (primary) hypertension: Secondary | ICD-10-CM | POA: Diagnosis not present

## 2022-05-21 DIAGNOSIS — Z9181 History of falling: Secondary | ICD-10-CM | POA: Diagnosis not present

## 2022-05-21 DIAGNOSIS — T8143XA Infection following a procedure, organ and space surgical site, initial encounter: Secondary | ICD-10-CM | POA: Diagnosis not present

## 2022-05-21 DIAGNOSIS — Z792 Long term (current) use of antibiotics: Secondary | ICD-10-CM | POA: Diagnosis not present

## 2022-05-21 DIAGNOSIS — Z4803 Encounter for change or removal of drains: Secondary | ICD-10-CM | POA: Diagnosis not present

## 2022-05-22 ENCOUNTER — Ambulatory Visit: Payer: Self-pay

## 2022-05-22 DIAGNOSIS — Z4802 Encounter for removal of sutures: Secondary | ICD-10-CM

## 2022-05-22 NOTE — Progress Notes (Signed)
Patient arrived for nurse visit to remove two sutures post- procedure VATS drainage of pleural effusion with Dr. Roxan Hockey 2/14.  Sutures removed with no signs/ symptoms of infection noted.  Patient tolerated procedure well.  One suture tied tight, when removed incision dehisced, superficial, other incision well approximated. Patient/ family instructed to keep the incision sites clean and dry, wash with soap and water, pat dry.  Patient/ family acknowledged instructions given.

## 2022-05-23 DIAGNOSIS — Z5189 Encounter for other specified aftercare: Secondary | ICD-10-CM | POA: Diagnosis not present

## 2022-05-26 ENCOUNTER — Other Ambulatory Visit: Payer: Self-pay | Admitting: Thoracic Surgery (Cardiothoracic Vascular Surgery)

## 2022-05-26 DIAGNOSIS — J90 Pleural effusion, not elsewhere classified: Secondary | ICD-10-CM

## 2022-05-27 ENCOUNTER — Ambulatory Visit
Admission: RE | Admit: 2022-05-27 | Discharge: 2022-05-27 | Disposition: A | Discharge status: Home / Self Care | Payer: Medicare Other | Source: Ambulatory Visit | Attending: Thoracic Surgery (Cardiothoracic Vascular Surgery) | Admitting: Thoracic Surgery (Cardiothoracic Vascular Surgery)

## 2022-05-27 ENCOUNTER — Ambulatory Visit (INDEPENDENT_AMBULATORY_CARE_PROVIDER_SITE_OTHER): Payer: Self-pay | Admitting: Thoracic Surgery (Cardiothoracic Vascular Surgery)

## 2022-05-27 ENCOUNTER — Encounter: Payer: Self-pay | Admitting: Thoracic Surgery (Cardiothoracic Vascular Surgery)

## 2022-05-27 VITALS — BP 105/72 | HR 88 | Resp 18 | Ht 63.0 in | Wt 111.0 lb

## 2022-05-27 DIAGNOSIS — Z09 Encounter for follow-up examination after completed treatment for conditions other than malignant neoplasm: Secondary | ICD-10-CM

## 2022-05-27 DIAGNOSIS — M419 Scoliosis, unspecified: Secondary | ICD-10-CM | POA: Diagnosis not present

## 2022-05-27 DIAGNOSIS — J90 Pleural effusion, not elsewhere classified: Secondary | ICD-10-CM

## 2022-05-27 DIAGNOSIS — Z8709 Personal history of other diseases of the respiratory system: Secondary | ICD-10-CM | POA: Diagnosis not present

## 2022-05-27 NOTE — Progress Notes (Signed)
[address]       [place] [address]             [phone]     HPI: [name] returns for follow-up following a recent VATS for drainage of a pleural effusion and decortication.  Alyssa Peterson is a 72 year old woman with a history of hypertension, anemia, gastroesophageal reflux, paraesophageal hernia, and pulmonary embolus.  She presented on New Year's Day with abdominal pain.  She had a large paraesophageal hernia with gastric volvulus.  She underwent laparoscopic repair by Dr. Windle Guard.  Postoperatively she had a large right pleural effusion.  Her effusion became loculated and a percutaneous drain was ineffective.  On the CT on 04/28/2022 she was noted to have a pulmonary embolus in the left lower lobe.  She was started on Eliquis.  Dr. Stanford Breed placed an IVC filter so we can hold her anticoagulation preop.  I did a right VATS to drain the loculated effusion and decorticate the pleura on 05/07/2022.  There was about 800 mL of murky fluid.  Cultures were negative.  Pathology showed acute and chronic inflammation.  No malignancy was seen.  She did well postoperatively and went home on day 5.  She well overall.  She is not having any respiratory difficulties.  She does have some incisional discomfort.  She is been taking tramadol once or twice a day.  Not using Tylenol or nonsteroidals.  She has questions about the Eliquis that she was on for PE.  Past Medical History:  Diagnosis Date   Anemia    Hx   Arthritis    Diaphragmatic hernia with obstruction    Gastric volvulus    GERD (gastroesophageal reflux disease)    History of hiatal hernia    Hypertension    Inguinal hernia    left   Pulmonary embolism ([facility])     Current Outpatient Medications  Medication Sig Dispense Refill   acetaminophen (TYLENOL) 325 MG tablet Take 2 tablets (650 mg total) by mouth every 6 (six) hours as needed for mild pain (or temp > 100).     amLODipine (NORVASC) 10 MG tablet Take 1  tablet (10 mg total) by mouth at bedtime.     apixaban (ELIQUIS) 5 MG TABS tablet Take 5 mg by mouth 2 (two) times daily.     calcium carbonate (TUMS - DOSED IN MG ELEMENTAL CALCIUM) 500 MG chewable tablet Chew 1 tablet by mouth daily as needed (Upset stomach).     gabapentin (NEURONTIN) 300 MG capsule Take 1 capsule (300 mg total) by mouth at bedtime. 30 capsule 0   LUMIGAN 0.01 % SOLN Place 1 drop into both eyes at bedtime.     polyethylene glycol powder (GLYCOLAX/MIRALAX) 17 GM/SCOOP powder Take 17 g by mouth daily as needed for mild constipation. 238 g 0   Sodium Chloride Flush (SALINE FLUSH) 0.9 % SOLN Flush drainage catheter with 5-10 ml once to two times daily. 300 mL 0   tamsulosin (FLOMAX) 0.4 MG CAPS capsule Take 1 capsule (0.4 mg total) by mouth daily after breakfast. 20 capsule 0   traMADol (ULTRAM) 50 MG tablet Take 1 tablet (50 mg total) by mouth every 6 (six) hours as needed for moderate pain (mild pain). 28 tablet 0   No current facility-administered medications for this visit.    Physical Exam BP 105/72 (BP Location: Left Arm, Patient Position: Sitting, Cuff Size: Normal)   Pulse 88   Resp 18  Ht '5\' 3"'$  (1.6 m)   Wt 111 lb (50.3 kg)   SpO2 97% Comment: RA  BMI 19.30 kg/m  72 year old woman in no acute distress Alert and oriented x 3 with no focal deficits Lungs slightly diminished at right base but otherwise clear Cardiac regular rate and rhythm Incisions clean dry and intact  Diagnostic Tests: CHEST - 2 VIEW   COMPARISON:  05/12/2022   FINDINGS: The heart size and mediastinal contours are within normal limits. Blunting of the right costophrenic angle may be secondary to scarring or represent a trace right pleural effusion. Band-like opacity in the right perihilar region, likely atelectasis. Left lung is clear. No pneumothorax. Scoliotic thoracolumbar curvature. An IVC filter is seen.   IMPRESSION: Blunting of the right costophrenic angle may be secondary  to scarring or represent a trace right pleural effusion.     Electronically Signed   By: Davina Poke D.O.   On: 05/27/2022 14:27   I personally reviewed the chest x-ray images.  Shows an excellent result post decortication.  Impression: Alyssa Peterson is a 72 year old woman with a history of hypertension, anemia, gastroesophageal reflux, paraesophageal hernia, and pulmonary embolus.    Loculated pleural effusion- now about 3 weeks out from VATS for drainage of the effusion.  Cultures were negative and path showed only inflammation.  Chest x-ray shows a good result.  Symptomatically she is significantly improved as well.  I will plan to check another x-ray on her in about 6 weeks.  There are no restrictions on her activities, but she was cautioned to avoid activities that cause discomfort.  She should resume normal activities gradually.  She had questions about Eliquis.  I showed them the CT scan with a blood clot in the left lower lobe PA.  Typically she would be on that for 6 months.  Plan: Follow-up with Dr. Windle Guard as scheduled next week Follow-up with Dr. Stanford Breed for IVC filter removal Return in 6 weeks with PA lateral chest x-ray  [name], MD [facility] [phone]

## 2022-06-04 ENCOUNTER — Ambulatory Visit
Admission: RE | Admit: 2022-06-04 | Discharge: 2022-06-04 | Disposition: A | Discharge status: Home / Self Care | Payer: Medicare Other | Source: Ambulatory Visit | Attending: Nurse Practitioner | Admitting: Nurse Practitioner

## 2022-06-04 DIAGNOSIS — Z1231 Encounter for screening mammogram for malignant neoplasm of breast: Secondary | ICD-10-CM | POA: Diagnosis not present

## 2022-06-06 ENCOUNTER — Encounter: Payer: Self-pay | Admitting: Nurse Practitioner

## 2022-06-06 ENCOUNTER — Ambulatory Visit: Payer: Medicare Other | Attending: Nurse Practitioner | Admitting: Nurse Practitioner

## 2022-06-06 VITALS — BP 116/73 | HR 74 | Ht 63.0 in | Wt 116.8 lb

## 2022-06-06 DIAGNOSIS — I10 Essential (primary) hypertension: Secondary | ICD-10-CM

## 2022-06-06 DIAGNOSIS — Z23 Encounter for immunization: Secondary | ICD-10-CM | POA: Diagnosis not present

## 2022-06-06 DIAGNOSIS — H409 Unspecified glaucoma: Secondary | ICD-10-CM | POA: Diagnosis not present

## 2022-06-06 NOTE — Progress Notes (Signed)
Patient would like to know why the filter was placed on her right and if it is permanent. Patient states that it cause her pain and discomfort.

## 2022-06-06 NOTE — Progress Notes (Signed)
Assessment & Plan:  Alyssa Peterson was seen today for hypertension.  Diagnoses and all orders for this visit:  Hospital discharge follow-up  Need for influenza vaccination -     Flu Vaccine QUAD High Dose(Fluad)  Glaucoma of right eye, unspecified glaucoma type -     Ambulatory referral to Ophthalmology    Patient has been counseled on age-appropriate routine health concerns for screening and prevention. These are reviewed and up-to-date. Referrals have been placed accordingly. Immunizations are up-to-date or declined.    Subjective:   Chief Complaint  Patient presents with   Hypertension   [name] 72 y.o. female presents to office today for HTN  She has a PMH of HTN, anemia, GERD, paraesophageal hernia, PE   Had incarcerated diaphragmatic hernia repair last month. Post op complications included large right pleural effusion and fluid within the previous hernia sac. Required pigtail catheter which was ineffective in relieving drainage.  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 She was taken to the OR on 2-14 for right VATS and decortication of complex effusion. IVC filter was placed at that time as Eliquis had to be held ([place]). Since then her Eliquis has been restarted. She will continue on this for the next 6 months.    HTN Blood pressure is well-controlled today with amlodipine 10 mg daily. BP Readings from Last 3 Encounters:  06/06/22 116/73  05/27/22 105/72  05/12/22 105/81    Review of Systems  Constitutional:  Negative for fever, malaise/fatigue and weight loss.  HENT: Negative.  Negative for nosebleeds.   Eyes: Negative.  Negative for blurred vision, double vision and photophobia.  Respiratory: Negative.  Negative for cough and shortness of breath.   Cardiovascular: Negative.  Negative for chest pain,  palpitations and leg swelling.  Gastrointestinal: Negative.  Negative for heartburn, nausea and vomiting.  Musculoskeletal:  Positive for myalgias.  Neurological: Negative.  Negative for dizziness, focal weakness, seizures and headaches.  Psychiatric/Behavioral: Negative.  Negative for suicidal ideas.     Past Medical History:  Diagnosis Date   Anemia    Hx   Arthritis    Diaphragmatic hernia with obstruction    Gastric volvulus    GERD (gastroesophageal reflux disease)    History of hiatal hernia    Hypertension    Inguinal hernia    left   Pulmonary embolism ([facility])     Past Surgical History:  Procedure Laterality Date   COLONOSCOPY     CYST REMOVAL NECK     DENTAL SURGERY     FEMORAL HERNIA REPAIR Left 07/17/2016   Procedure: LEFT FEMORAL  HERNIA REPAIR WITH MESH;  Surgeon: Donnie Mesa, MD;  Location: [facility];  Service: General;  Laterality: Left;   GASTROJEJUNOSTOMY N/A 07/04/2020   Procedure: GASTROSTOMY TUBE;  Surgeon: Clovis Riley, MD;  Location: [facility];  Service: General;  Laterality: N/A;   GASTROSTOMY N/A 03/27/2022   Procedure: INSERTION OF GASTROSTOMY TUBE;  Surgeon: Clovis Riley, MD;  Location: [facility];  Service: General;  Laterality: N/A;   HIATAL HERNIA REPAIR N/A 03/27/2022   Procedure: LAPAROSCOPIC REPAIR OF HIATAL HERNIA;  Surgeon: Clovis Riley, MD;  Location: [facility];  Service: General;  Laterality: N/A;   IVC FILTER INSERTION N/A 05/02/2022   Procedure: IVC FILTER INSERTION;  Surgeon: Cherre Robins, MD;  Location: [facility];  Service: Cardiovascular;  Laterality: N/A;   IVC VENOGRAPHY N/A 05/02/2022   Procedure: IVC Venography;  Surgeon: Cherre Robins, MD;  Location: [facility];  Service: Cardiovascular;  Laterality: N/A;   LAPAROSCOPIC LYSIS OF ADHESIONS N/A 03/27/2022   Procedure: LAPAROSCOPIC LYSIS OF ADHESIONS;  Surgeon: Clovis Riley, MD;  Location: [facility];  Service: General;  Laterality: N/A;    LAPAROTOMY N/A 07/04/2020   Procedure: EXPLORATORY LAPAROTOMY;  Surgeon: Clovis Riley, MD;  Location: [facility];  Service: General;  Laterality: N/A;   LASIK  2023   PLEURAL EFFUSION DRAINAGE Right 05/07/2022   Procedure: DRAINAGE OF PLEURAL EFFUSION;  Surgeon: [name], MD;  Location: [facility];  Service: Thoracic;  Laterality: Right;   VIDEO ASSISTED THORACOSCOPY (VATS)/DECORTICATION Right 05/07/2022   Procedure: VIDEO ASSISTED THORACOSCOPY (VATS)/DECORTICATION;  Surgeon: [name], MD;  Location: [facility];  Service: Thoracic;  Laterality: Right;    History reviewed. No pertinent family history.  Social History Reviewed with no changes to be made today.   Outpatient Medications Prior to Visit  Medication Sig Dispense Refill   acetaminophen (TYLENOL) 325 MG tablet Take 2 tablets (650 mg total) by mouth every 6 (six) hours as needed for mild pain (or temp > 100).     amLODipine (NORVASC) 10 MG tablet Take 1 tablet (10 mg total) by mouth at bedtime.     apixaban (ELIQUIS) 5 MG TABS tablet Take 5 mg by mouth 2 (two) times daily.     gabapentin (NEURONTIN) 300 MG capsule Take 1 capsule (300 mg total) by mouth at bedtime. 30 capsule 0   LUMIGAN 0.01 % SOLN Place 1 drop into both eyes at bedtime.     tamsulosin (FLOMAX) 0.4 MG CAPS capsule Take 1 capsule (0.4 mg total) by mouth daily after breakfast. 20 capsule 0   traMADol (ULTRAM) 50 MG tablet Take 1 tablet (50 mg total) by mouth every 6 (six) hours as needed for moderate pain (mild pain). 28 tablet 0   calcium carbonate (TUMS - DOSED IN MG ELEMENTAL CALCIUM) 500 MG chewable tablet Chew 1 tablet by mouth daily as needed (Upset stomach). (Patient not taking: Reported on 06/06/2022)     polyethylene glycol powder (GLYCOLAX/MIRALAX) 17 GM/SCOOP powder Take 17 g by mouth daily as needed for mild constipation. (Patient not taking: Reported on 06/06/2022) 238 g 0   Sodium Chloride Flush (SALINE FLUSH) 0.9 % SOLN Flush drainage catheter  with 5-10 ml once to two times daily. (Patient not taking: Reported on 06/06/2022) 300 mL 0   No facility-administered medications prior to visit.    Allergies  Allergen Reactions   Latex Itching    hands       Objective:    BP 116/73   Pulse 74   Ht 5\' 3"  (1.6 m)   Wt 116 lb 12.8 oz (53 kg)   SpO2 98%   BMI 20.69 kg/m  Wt Readings from Last 3 Encounters:  06/06/22 116 lb 12.8 oz (53 kg)  05/27/22 111 lb (50.3 kg)  05/07/22 111 lb (50.3 kg)    Physical Exam Vitals and nursing note reviewed.  Constitutional:      Appearance: She is well-developed.  HENT:     Head: Normocephalic and atraumatic.  Cardiovascular:     Rate and Rhythm: Normal rate and regular rhythm.     Heart sounds: Normal heart sounds. No murmur heard.    No friction rub. No gallop.  Pulmonary:     Effort: Pulmonary effort is normal. No tachypnea or respiratory distress.     Breath sounds: Normal breath sounds. No decreased breath sounds, wheezing, rhonchi or rales.  Chest:     Chest wall: No tenderness.  Abdominal:     General: Bowel sounds are normal.     Palpations: Abdomen is soft.  Musculoskeletal:        General: Normal range of motion.     Cervical back: Normal range of motion.  Skin:    General: Skin is warm and dry.  Neurological:     Mental Status: She is alert and oriented to person, place, and time.     Coordination: Coordination normal.  Psychiatric:        Behavior: Behavior normal. Behavior is cooperative.        Thought Content: Thought content normal.        Judgment: Judgment normal.          Patient has been counseled extensively about nutrition and exercise as well as the importance of adherence with medications and regular follow-up. The patient was given clear instructions to go to ER or return to medical center if symptoms don't improve, worsen or new problems develop. The patient verbalized understanding.   Follow-up: No follow-ups on file.   Gildardo Pounds,  FNP-BC [facility] [place] [place]   06/06/2022, 1:32 PM

## 2022-06-17 ENCOUNTER — Telehealth: Payer: Self-pay | Admitting: Nurse Practitioner

## 2022-06-17 NOTE — Telephone Encounter (Signed)
Contacted Alyssa Peterson to schedule their annual wellness visit. Appointment made for 06/18/22.  Alyssa Peterson AWV direct phone # [phone]

## 2022-06-18 ENCOUNTER — Ambulatory Visit: Payer: Medicare Other | Attending: Nurse Practitioner

## 2022-06-18 VITALS — Ht 60.5 in | Wt 118.0 lb

## 2022-06-18 DIAGNOSIS — Z0000 Encounter for general adult medical examination without abnormal findings: Secondary | ICD-10-CM | POA: Diagnosis not present

## 2022-06-18 NOTE — Patient Instructions (Signed)
Alyssa Peterson , Thank you for taking time to come for your Medicare Wellness Visit. I appreciate your ongoing commitment to your health goals. Please review the following plan we discussed and let me know if I can assist you in the future.   These are the goals we discussed:  Goals      Patient Stated     06/18/2022, wants to keep health up        This is a list of the screening recommended for you and due dates:  Health Maintenance  Topic Date Due   COVID-19 Vaccine (1) Never done   Flu Shot  10/22/2021   Zoster (Shingles) Vaccine (1 of 2) 09/06/2022*   Pneumonia Vaccine (1 of 1 - PCV) 11/24/2022*   Medicare Annual Wellness Visit  06/18/2023   Mammogram  06/03/2024   Colon Cancer Screening  04/16/2026   DTaP/Tdap/Td vaccine (2 - Td or Tdap) 12/11/2026   DEXA scan (bone density measurement)  Completed   Hepatitis C Screening: USPSTF Recommendation to screen - Ages 46-79 yo.  Completed   HPV Vaccine  Aged Out  *Topic was postponed. The date shown is not the original due date.    Advanced directives: Advance directive discussed with you today.   Conditions/risks identified: none  Next appointment: Follow up in one year for your annual wellness visit    Preventive Care 65 Years and Older, Female Preventive care refers to lifestyle choices and visits with your health care provider that can promote health and wellness. What does preventive care include? A yearly physical exam. This is also called an annual well check. Dental exams once or twice a year. Routine eye exams. Ask your health care provider how often you should have your eyes checked. Personal lifestyle choices, including: Daily care of your teeth and gums. Regular physical activity. Eating a healthy diet. Avoiding tobacco and drug use. Limiting alcohol use. Practicing safe sex. Taking low-dose aspirin every day. Taking vitamin and mineral supplements as recommended by your health care provider. What happens  during an annual well check? The services and screenings done by your health care provider during your annual well check will depend on your age, overall health, lifestyle risk factors, and family history of disease. Counseling  Your health care provider may ask you questions about your: Alcohol use. Tobacco use. Drug use. Emotional well-being. Home and relationship well-being. Sexual activity. Eating habits. History of falls. Memory and ability to understand (cognition). Work and work Statistician. Reproductive health. Screening  You may have the following tests or measurements: Height, weight, and BMI. Blood pressure. Lipid and cholesterol levels. These may be checked every 5 years, or more frequently if you are over 48 years old. Skin check. Lung cancer screening. You may have this screening every year starting at age 40 if you have a 30-pack-year history of smoking and currently smoke or have quit within the past 15 years. Fecal occult blood test (FOBT) of the stool. You may have this test every year starting at age 38. Flexible sigmoidoscopy or colonoscopy. You may have a sigmoidoscopy every 5 years or a colonoscopy every 10 years starting at age 64. Hepatitis C blood test. Hepatitis B blood test. Sexually transmitted disease (STD) testing. Diabetes screening. This is done by checking your blood sugar (glucose) after you have not eaten for a while (fasting). You may have this done every 1-3 years. Bone density scan. This is done to screen for osteoporosis. You may have this done starting at age  65. Mammogram. This may be done every 1-2 years. Talk to your health care provider about how often you should have regular mammograms. Talk with your health care provider about your test results, treatment options, and if necessary, the need for more tests. Vaccines  Your health care provider may recommend certain vaccines, such as: Influenza vaccine. This is recommended every  year. Tetanus, diphtheria, and acellular pertussis (Tdap, Td) vaccine. You may need a Td booster every 10 years. Zoster vaccine. You may need this after age 37. Pneumococcal 13-valent conjugate (PCV13) vaccine. One dose is recommended after age 76. Pneumococcal polysaccharide (PPSV23) vaccine. One dose is recommended after age 23. Talk to your health care provider about which screenings and vaccines you need and how often you need them. This information is not intended to replace advice given to you by your health care provider. Make sure you discuss any questions you have with your health care provider. Document Released: 04/06/2015 Document Revised: 11/28/2015 Document Reviewed: 01/09/2015 Elsevier Interactive Patient Education  2017 [place] Prevention in the Home Falls can cause injuries. They can happen to people of all ages. There are many things you can do to make your home safe and to help prevent falls. What can I do on the outside of my home? Regularly fix the edges of walkways and driveways and fix any cracks. Remove anything that might make you trip as you walk through a door, such as a raised step or threshold. Trim any bushes or trees on the path to your home. Use bright outdoor lighting. Clear any walking paths of anything that might make someone trip, such as rocks or tools. Regularly check to see if handrails are loose or broken. Make sure that both sides of any steps have handrails. Any raised decks and porches should have guardrails on the edges. Have any leaves, snow, or ice cleared regularly. Use sand or salt on walking paths during winter. Clean up any spills in your garage right away. This includes oil or grease spills. What can I do in the bathroom? Use night lights. Install grab bars by the toilet and in the tub and shower. Do not use towel bars as grab bars. Use non-skid mats or decals in the tub or shower. If you need to sit down in the shower, use a  plastic, non-slip stool. Keep the floor dry. Clean up any water that spills on the floor as soon as it happens. Remove soap buildup in the tub or shower regularly. Attach bath mats securely with double-sided non-slip rug tape. Do not have throw rugs and other things on the floor that can make you trip. What can I do in the bedroom? Use night lights. Make sure that you have a light by your bed that is easy to reach. Do not use any sheets or blankets that are too big for your bed. They should not hang down onto the floor. Have a firm chair that has side arms. You can use this for support while you get dressed. Do not have throw rugs and other things on the floor that can make you trip. What can I do in the kitchen? Clean up any spills right away. Avoid walking on wet floors. Keep items that you use a lot in easy-to-reach places. If you need to reach something above you, use a strong step stool that has a grab bar. Keep electrical cords out of the way. Do not use floor polish or wax that makes floors slippery.  If you must use wax, use non-skid floor wax. Do not have throw rugs and other things on the floor that can make you trip. What can I do with my stairs? Do not leave any items on the stairs. Make sure that there are handrails on both sides of the stairs and use them. Fix handrails that are broken or loose. Make sure that handrails are as long as the stairways. Check any carpeting to make sure that it is firmly attached to the stairs. Fix any carpet that is loose or worn. Avoid having throw rugs at the top or bottom of the stairs. If you do have throw rugs, attach them to the floor with carpet tape. Make sure that you have a light switch at the top of the stairs and the bottom of the stairs. If you do not have them, ask someone to add them for you. What else can I do to help prevent falls? Wear shoes that: Do not have high heels. Have rubber bottoms. Are comfortable and fit you  well. Are closed at the toe. Do not wear sandals. If you use a stepladder: Make sure that it is fully opened. Do not climb a closed stepladder. Make sure that both sides of the stepladder are locked into place. Ask someone to hold it for you, if possible. Clearly mark and make sure that you can see: Any grab bars or handrails. First and last steps. Where the edge of each step is. Use tools that help you move around (mobility aids) if they are needed. These include: Canes. Walkers. Scooters. Crutches. Turn on the lights when you go into a dark area. Replace any light bulbs as soon as they burn out. Set up your furniture so you have a clear path. Avoid moving your furniture around. If any of your floors are uneven, fix them. If there are any pets around you, be aware of where they are. Review your medicines with your doctor. Some medicines can make you feel dizzy. This can increase your chance of falling. Ask your doctor what other things that you can do to help prevent falls. This information is not intended to replace advice given to you by your health care provider. Make sure you discuss any questions you have with your health care provider. Document Released: 01/04/2009 Document Revised: 08/16/2015 Document Reviewed: 04/14/2014 Elsevier Interactive Patient Education  2017 [employer].

## 2022-06-18 NOTE — Progress Notes (Signed)
I connected with  Alyssa Peterson on 06/18/22 by a audio enabled telemedicine application and verified that I am speaking with the correct person using two identifiers.  Patient Location: Home  Provider Location: Office/Clinic  I discussed the limitations of evaluation and management by telemedicine. The patient expressed understanding and agreed to proceed.  Subjective:   Alyssa Peterson is a 72 y.o. female who presents for an Initial Medicare Annual Wellness Visit.  Review of Systems     Cardiac Risk Factors include: advanced age (>55men, >80 women);hypertension     Objective:    Today's Vitals   06/18/22 1131  Weight: 118 lb (53.5 kg)  Height: 5' 0.5" (1.537 m)   Body mass index is 22.67 kg/m.     06/18/2022   11:36 AM 05/05/2022    3:17 PM 05/02/2022    9:51 AM 03/24/2022    4:31 PM 07/06/2020    6:19 PM 07/04/2020    1:26 PM 07/17/2016    7:22 AM  Advanced Directives  Does Patient Have a Medical Advance Directive? No No No No  No No  Would patient like information on creating a medical advance directive?  No - Patient declined No - Patient declined No - Patient declined No - Patient declined  Yes (MAU/Ambulatory/Procedural Areas - Information given)    Current Medications (verified) Outpatient Encounter Medications as of 06/18/2022  Medication Sig   acetaminophen (TYLENOL) 325 MG tablet Take 2 tablets (650 mg total) by mouth every 6 (six) hours as needed for mild pain (or temp > 100).   amLODipine (NORVASC) 10 MG tablet Take 1 tablet (10 mg total) by mouth at bedtime.   apixaban (ELIQUIS) 5 MG TABS tablet Take 5 mg by mouth 2 (two) times daily.   gabapentin (NEURONTIN) 300 MG capsule Take 1 capsule (300 mg total) by mouth at bedtime.   LUMIGAN 0.01 % SOLN Place 1 drop into both eyes at bedtime.   traMADol (ULTRAM) 50 MG tablet Take 1 tablet (50 mg total) by mouth every 6 (six) hours as needed for moderate pain (mild pain).   No facility-administered encounter  medications on file as of 06/18/2022.    Allergies (verified) Latex   History: Past Medical History:  Diagnosis Date   Anemia    Hx   Arthritis    Diaphragmatic hernia with obstruction    Gastric volvulus    GERD (gastroesophageal reflux disease)    History of hiatal hernia    Hypertension    Inguinal hernia    left   Pulmonary embolism ([place])    Past Surgical History:  Procedure Laterality Date   COLONOSCOPY     CYST REMOVAL NECK     DENTAL SURGERY     FEMORAL HERNIA REPAIR Left 07/17/2016   Procedure: LEFT FEMORAL  HERNIA REPAIR WITH MESH;  Surgeon: Donnie Mesa, MD;  Location: [place];  Service: General;  Laterality: Left;   GASTROJEJUNOSTOMY N/A 07/04/2020   Procedure: GASTROSTOMY TUBE;  Surgeon: Clovis Riley, MD;  Location: [place];  Service: General;  Laterality: N/A;   GASTROSTOMY N/A 03/27/2022   Procedure: INSERTION OF GASTROSTOMY TUBE;  Surgeon: Clovis Riley, MD;  Location: [place];  Service: General;  Laterality: N/A;   HIATAL HERNIA REPAIR N/A 03/27/2022   Procedure: LAPAROSCOPIC REPAIR OF HIATAL HERNIA;  Surgeon: Clovis Riley, MD;  Location: [place];  Service: General;  Laterality: N/A;   IVC FILTER INSERTION N/A 05/02/2022   Procedure: IVC FILTER INSERTION;  Surgeon: Cherre Robins, MD;  Location: [facility];  Service: Cardiovascular;  Laterality: N/A;   IVC VENOGRAPHY N/A 05/02/2022   Procedure: IVC Venography;  Surgeon: Cherre Robins, MD;  Location: [facility];  Service: Cardiovascular;  Laterality: N/A;   LAPAROSCOPIC LYSIS OF ADHESIONS N/A 03/27/2022   Procedure: LAPAROSCOPIC LYSIS OF ADHESIONS;  Surgeon: Clovis Riley, MD;  Location: [place];  Service: General;  Laterality: N/A;   LAPAROTOMY N/A 07/04/2020   Procedure: EXPLORATORY LAPAROTOMY;  Surgeon: Clovis Riley, MD;  Location: [place];  Service: General;  Laterality: N/A;   LASIK  2023   PLEURAL EFFUSION DRAINAGE Right 05/07/2022   Procedure: DRAINAGE OF  PLEURAL EFFUSION;  Surgeon: [name], MD;  Location: [place];  Service: Thoracic;  Laterality: Right;   VIDEO ASSISTED THORACOSCOPY (VATS)/DECORTICATION Right 05/07/2022   Procedure: VIDEO ASSISTED THORACOSCOPY (VATS)/DECORTICATION;  Surgeon: [name], MD;  Location: [place];  Service: Thoracic;  Laterality: Right;   History reviewed. No pertinent family history. Social History   Socioeconomic History   Marital status: Divorced    Spouse name: Not on file   Number of children: Not on file   Years of education: Not on file   Highest education level: Not on file  Occupational History   Not on file  Tobacco Use   Smoking status: Never   Smokeless tobacco: Never  Vaping Use   Vaping Use: Never used  Substance and Sexual Activity   Alcohol use: No    Alcohol/week: 0.0 standard drinks of alcohol   Drug use: No   Sexual activity: Not Currently  Other Topics Concern   Not on file  Social History Narrative   Not on file   Social Determinants of Health   Financial Resource Strain: Low Risk  (06/18/2022)   Overall Financial Resource Strain (CARDIA)    Difficulty of Paying Living Expenses: Not hard at all  Food Insecurity: No Food Insecurity (06/18/2022)   Hunger Vital Sign    Worried About Running Out of Food in the Last Year: Never true    [place] in the Last Year: Never true  Transportation Needs: No Transportation Needs (06/18/2022)   PRAPARE - Hydrologist (Medical): No    Lack of Transportation (Non-Medical): No  Physical Activity: Inactive (06/18/2022)   Exercise Vital Sign    Days of Exercise per Week: 0 days    Minutes of Exercise per Session: 0 min  Stress: No Stress Concern Present (06/18/2022)   [place]    Feeling of Stress : Not at all  Social Connections: Not on file    Tobacco Counseling Counseling given: Not Answered   Clinical  Intake:  Pre-visit preparation completed: Yes  Pain : No/denies pain     Nutritional Status: BMI of 19-24  Normal Nutritional Risks: Nausea/ vomitting/ diarrhea (diarrhea for two days, has resolved) Diabetes: No  How often do you need to have someone help you when you read instructions, pamphlets, or other written materials from your doctor or pharmacy?: 1 - Never  Diabetic? no  Interpreter Needed?: No  Information entered by :: NAllen LPN   Activities of Daily Living    06/18/2022   11:37 AM 05/05/2022    3:18 PM  In your present state of health, do you have any difficulty performing the following activities:  Hearing? 0   Vision? 1  Comment foggy vision at times   Difficulty concentrating or making decisions? 0   Walking or climbing stairs? 0   Dressing or bathing? 0   Doing errands, shopping? 0 1  Comment  not currently driving  Preparing Food and eating ? N   Using the Toilet? N   In the past six months, have you accidently leaked urine? N   Do you have problems with loss of bowel control? N   Managing your Medications? N   Managing your Finances? N   Housekeeping or managing your Housekeeping? N     Patient Care Team: Gildardo Pounds, NP as PCP - General (Nurse Practitioner)  Indicate any recent Medical Services you may have received from other than [facility] providers in the past year (date may be approximate).     Assessment:   This is a routine wellness examination for [place].  Hearing/Vision screen Vision Screening - Comments:: No regular eye exams,  Dietary issues and exercise activities discussed: Current Exercise Habits: The patient does not participate in regular exercise at present   Goals Addressed             This Visit's Progress    Patient Stated       06/18/2022, wants to keep health up       Depression Screen    06/18/2022   11:37 AM 05/01/2022    9:37 AM 01/07/2022    9:53 AM 12/04/2020    4:37 PM 10/03/2020    1:58 PM 08/23/2020    10:43 AM 04/09/2018   11:27 AM  PHQ 2/9 Scores  PHQ - 2 Score 0 0 0 0 0 0 0  PHQ- 9 Score   0 0 1      Fall Risk    06/18/2022   11:37 AM 06/06/2022    9:42 AM 05/01/2022    9:37 AM 01/07/2022    8:57 AM 12/04/2020    4:37 PM  Fall Risk   Falls in the past year? 0 0 0 1 0  Number falls in past yr: 0 0  0 0  Injury with Fall? 0 0  0 0  Risk for fall due to : Medication side effect No Fall Risks No Fall Risks  No Fall Risks  Follow up Falls prevention discussed;Education provided;Falls evaluation completed Falls evaluation completed Falls evaluation completed Falls evaluation completed     FALL RISK PREVENTION PERTAINING TO THE HOME:  Any stairs in or around the home? Yes  If so, are there any without handrails? Yes  Home free of loose throw rugs in walkways, pet beds, electrical cords, etc? Yes  Adequate lighting in your home to reduce risk of falls? Yes   ASSISTIVE DEVICES UTILIZED TO PREVENT FALLS:  Life alert? No  Use of a cane, walker or w/c? No  Grab bars in the bathroom? Yes  Shower chair or bench in shower? No  Elevated toilet seat or a handicapped toilet? Yes   TIMED UP AND GO:  Was the test performed? No .       Cognitive Function:        06/18/2022   11:39 AM  6CIT Screen  What Year? 0 points  What month? 0 points  What time? 0 points  Count back from 20 0 points  Months in reverse 0 points  Repeat phrase 2 points  Total Score 2 points    Immunizations Immunization History  Administered Date(s) Administered   Influenza, High Dose Seasonal PF 04/09/2018  Tdap 12/10/2016    TDAP status: Up to date  Flu Vaccine status: Due, Education has been provided regarding the importance of this vaccine. Advised may receive this vaccine at local pharmacy or Health Dept. Aware to provide a copy of the vaccination record if obtained from local pharmacy or Health Dept. Verbalized acceptance and understanding.  Pneumococcal vaccine status: Declined,   Education has been provided regarding the importance of this vaccine but patient still declined. Advised may receive this vaccine at local pharmacy or Health Dept. Aware to provide a copy of the vaccination record if obtained from local pharmacy or Health Dept. Verbalized acceptance and understanding.   Covid-19 vaccine status: Completed vaccines  Qualifies for Shingles Vaccine? Yes   Zostavax completed No   Shingrix Completed?: No.    Education has been provided regarding the importance of this vaccine. Patient has been advised to call insurance company to determine out of pocket expense if they have not yet received this vaccine. Advised may also receive vaccine at local pharmacy or Health Dept. Verbalized acceptance and understanding.  Screening Tests Health Maintenance  Topic Date Due   Medicare Annual Wellness (AWV)  Never done   COVID-19 Vaccine (1) Never done   INFLUENZA VACCINE  10/22/2021   Zoster Vaccines- Shingrix (1 of 2) 09/06/2022 (Originally 02/03/2003)   Pneumonia Vaccine 69+ Years old (1 of 1 - PCV) 11/24/2022 (Originally 02/02/2018)   MAMMOGRAM  06/03/2024   COLONOSCOPY (Pts 45-39yrs Insurance coverage will need to be confirmed)  04/16/2026   DTaP/Tdap/Td (2 - Td or Tdap) 12/11/2026   DEXA SCAN  Completed   Hepatitis C Screening  Completed   HPV VACCINES  Aged Out    Health Maintenance  Health Maintenance Due  Topic Date Due   Medicare Annual Wellness (AWV)  Never done   COVID-19 Vaccine (1) Never done   INFLUENZA VACCINE  10/22/2021    Colorectal cancer screening: Type of screening: Colonoscopy. Completed 04/16/2016. Repeat every 10 years  Mammogram status: Completed 06/04/2022. Repeat every year  Bone Density status: Completed 04/12/2021.   Lung Cancer Screening: (Low Dose CT Chest recommended if Age 70-80 years, 30 pack-year currently smoking OR have quit w/in 15years.) does not qualify.   Lung Cancer Screening Referral: no  Additional  Screening:  Hepatitis C Screening: does qualify; Completed 01/07/2022  Vision Screening: Recommended annual ophthalmology exams for early detection of glaucoma and other disorders of the eye. Is the patient up to date with their annual eye exam?  No  Who is the provider or what is the name of the office in which the patient attends annual eye exams? none If pt is not established with a provider, would they like to be referred to a provider to establish care? No .   Dental Screening: Recommended annual dental exams for proper oral hygiene  Community Resource Referral / Chronic Care Management: CRR required this visit?  No   CCM required this visit?  No      Plan:     I have personally reviewed and noted the following in the patient's chart:   Medical and social history Use of alcohol, tobacco or illicit drugs  Current medications and supplements including opioid prescriptions. Patient is not currently taking opioid prescriptions. Functional ability and status Nutritional status Physical activity Advanced directives List of other physicians Hospitalizations, surgeries, and ER visits in previous 12 months Vitals Screenings to include cognitive, depression, and falls Referrals and appointments  In addition, I have reviewed and discussed with  patient certain preventive protocols, quality metrics, and best practice recommendations. A written personalized care plan for preventive services as well as general preventive health recommendations were provided to patient.     Kellie Simmering, LPN   [id number]   Nurse Notes: none  Due to this being a virtual visit, the after visit summary with patients personalized plan was offered to patient via mail or my-chart. to pick up at office at next visit

## 2022-07-08 ENCOUNTER — Other Ambulatory Visit (HOSPITAL_COMMUNITY): Payer: Self-pay

## 2022-07-08 ENCOUNTER — Other Ambulatory Visit: Payer: Self-pay

## 2022-07-08 ENCOUNTER — Ambulatory Visit (INDEPENDENT_AMBULATORY_CARE_PROVIDER_SITE_OTHER): Payer: Medicare Other | Admitting: Vascular Surgery

## 2022-07-08 ENCOUNTER — Encounter: Payer: Self-pay | Admitting: Vascular Surgery

## 2022-07-08 VITALS — BP 109/69 | HR 87 | Temp 98.0°F | Resp 18 | Ht 60.0 in | Wt 112.0 lb

## 2022-07-08 DIAGNOSIS — Z95828 Presence of other vascular implants and grafts: Secondary | ICD-10-CM | POA: Diagnosis not present

## 2022-07-08 DIAGNOSIS — I2782 Chronic pulmonary embolism: Secondary | ICD-10-CM

## 2022-07-08 NOTE — Progress Notes (Signed)
[facility] [place]  ASSESSMENT / PLAN: 71 y.o. female with history of DVT/PE on Eliquis.  She has undergone VATS decortication for loculated effusion.  She feels much better overall.  We will plan to retrieve her IVC filter in the operating room in the near future.  CHIEF COMPLAINT: IVC filter in place  HISTORY OF PRESENT ILLNESS: Alyssa Peterson is a 71 y.o. female referred to clinic for evaluation of possible IVC filter placement.  The patient is a very pleasant woman who underwent laparoscopic esophageal hernia repair.  This complicated by large pleural effusion which required tube thoracostomy.  The effusion reaccumulated, and is now loculated.  Dr. Hendrickson saw the patient earlier today, and recommended decortication.  The patient's history is also significant for pulmonary embolism, for which she is on Eliquis.  This was recently diagnosed, and the patient needs to complete a course of treatment before discontinuing her anticoagulation altogether.  For this reason the IVC filter was requested.  07/08/22: Returns to office for follow-up after IVC filter placement and VATS decortication for loculated effusion.  Overall she is doing very well.  Her breathing is improved.  She feels well.  I reviewed the importance of retrieving the IVC filter.  She is in agreement.  Past Medical History:  Diagnosis Date   Anemia    Hx   Arthritis    Diaphragmatic hernia with obstruction    Gastric volvulus    GERD (gastroesophageal reflux disease)    History of hiatal hernia    Hypertension    Inguinal hernia    left   Pulmonary embolism     Past Surgical History:  Procedure Laterality Date   COLONOSCOPY     CYST REMOVAL NECK     DENTAL SURGERY     FEMORAL HERNIA REPAIR Left 07/17/2016   Procedure: LEFT FEMORAL  HERNIA REPAIR WITH MESH;  Surgeon: Matthew Tsuei, MD;  Location: [facility];  Service: General;  Laterality: Left;   GASTROJEJUNOSTOMY N/A  07/04/2020   Procedure: GASTROSTOMY TUBE;  Surgeon: Connor, Chelsea A, MD;  [facility]: [facility];  Service: General;  Laterality: N/A;   GASTROSTOMY N/A 03/27/2022   Procedure: INSERTION OF GASTROSTOMY TUBE;  Surgeon: Connor, Chelsea A, MD;  Location: [facility];  Service: General;  Laterality: N/A;   HIATAL HERNIA REPAIR N/A 03/27/2022   Procedure: LAPAROSCOPIC REPAIR OF HIATAL HERNIA;  Surgeon: Connor, Chelsea A, MD;  Location: [facility];  Service: General;  Laterality: N/A;   IVC FILTER INSERTION N/A 05/02/2022   Procedure: IVC FILTER INSERTION;  Surgeon: Rivka Baune N, MD;  [facility];  Service: Cardiovascular;  Laterality: N/A;   IVC VENOGRAPHY N/A 05/02/2022   Procedure: IVC Venography;  Surgeon: Ranger Petrich N, MD;  [facility];  Service: Cardiovascular;  Laterality: N/A;   LAPAROSCOPIC LYSIS OF ADHESIONS N/A 03/27/2022   Procedure: LAPAROSCOPIC LYSIS OF ADHESIONS;  Surgeon: Connor, Chelsea A, MD;  [facility];  Service: General;  Laterality: N/A;   LAPAROTOMY N/A 07/04/2020   Procedure: EXPLORATORY LAPAROTOMY;  Surgeon: Connor, Chelsea A, MD;  [facility];  Service: General;  Laterality: N/A;   LASIK  2023   PLEURAL EFFUSION DRAINAGE Right 05/07/2022   Procedure: DRAINAGE OF PLEURAL EFFUSION;  Surgeon: Hendrickson, Steven C, MD;  [facility]: [facility];  Service: Thoracic;  Laterality: Right;   VIDEO ASSISTED THORACOSCOPY (VATS)/DECORTICATION Right 05/07/2022   Procedure: VIDEO ASSISTED THORACOSCOPY (VATS)/DECORTICATION;  Surgeon: Hendrickson, Steven C, MD;    [facility]: [facility];  Service: Thoracic;  Laterality: Right;    History reviewed. No pertinent family history.  Social History   Socioeconomic History   Marital status: Divorced    Spouse name: Not on file   Number of children: Not on file   Years of education: Not on file   Highest education level: Not on file  Occupational History   Not on file  Tobacco Use   Smoking status: Never   Smokeless tobacco:  Never  Vaping Use   Vaping Use: Never used  Substance and Sexual Activity   Alcohol use: No    Alcohol/week: 0.0 standard drinks of alcohol   Drug use: No   Sexual activity: Not Currently  Other Topics Concern   Not on file  Social History Narrative   Not on file   Social Determinants of Health   Financial Resource Strain: Low Risk  (06/18/2022)   Overall Financial Resource Strain (CARDIA)    Difficulty of Paying Living Expenses: Not hard at all  Food Insecurity: No Food Insecurity (06/18/2022)   Hunger Vital Sign    Worried About Running Out of Food in the Last Year: Never true    Ran Out of Food in the Last Year: Never true  Transportation Needs: No Transportation Needs (06/18/2022)   PRAPARE - Transportation    Lack of Transportation (Medical): No    Lack of Transportation (Non-Medical): No  Physical Activity: Inactive (06/18/2022)   Exercise Vital Sign    Days of Exercise per Week: 0 days    Minutes of Exercise per Session: 0 min  Stress: No Stress Concern Present (06/18/2022)   [employer] - Occupational Stress Questionnaire    Feeling of Stress : Not at all  Social Connections: Not on file  Intimate Partner Violence: Not At Risk (03/24/2022)   Humiliation, Afraid, Rape, and Kick questionnaire    Fear of Current or Ex-Partner: No    Emotionally Abused: No    Physically Abused: No    Sexually Abused: No    Allergies  Allergen Reactions   Latex Itching    hands    Current Outpatient Medications  Medication Sig Dispense Refill   acetaminophen (TYLENOL) 325 MG tablet Take 2 tablets (650 mg total) by mouth every 6 (six) hours as needed for mild pain (or temp > 100).     amLODipine (NORVASC) 10 MG tablet Take 1 tablet (10 mg total) by mouth at bedtime.     apixaban (ELIQUIS) 5 MG TABS tablet Take 5 mg by mouth 2 (two) times daily.     gabapentin (NEURONTIN) 300 MG capsule Take 1 capsule (300 mg total) by mouth at bedtime. 30 capsule 0    LUMIGAN 0.01 % SOLN Place 1 drop into both eyes at bedtime.     traMADol (ULTRAM) 50 MG tablet Take 1 tablet (50 mg total) by mouth every 6 (six) hours as needed for moderate pain (mild pain). 28 tablet 0   No current facility-administered medications for this visit.    PHYSICAL EXAM Vitals:   07/08/22 0928  BP: 109/69  Pulse: 87  Resp: 18  Temp: 98 F (36.7 C)  SpO2: 97%  Weight: 112 lb (50.8 kg)  Height: 5' (1.524 m)   Thin woman in no acute distress Regular rate and rhythm Unlabored breathing  PERTINENT LABORATORY AND RADIOLOGIC DATA  Most recent CBC    Latest Ref Rng & Units 05/12/2022   12:23 AM 05/09/2022   12:34 AM   05/08/2022   12:14 AM  CBC  WBC 4.0 - 10.5 K/uL 4.4  14.4  7.4   Hemoglobin 12.0 - 15.0 g/dL 9.6  10.5  10.3   Hematocrit 36.0 - 46.0 % 31.1  33.7  31.4   Platelets 150 - 400 K/uL 294  433  350      Most recent CMP    Latest Ref Rng & Units 05/12/2022   12:23 AM 05/11/2022   12:18 AM 05/09/2022   12:34 AM  CMP  Glucose 70 - 99 mg/dL 93  118  142   BUN 8 - 23 mg/dL 11  12  13   Creatinine 0.44 - 1.00 mg/dL 0.68  0.64  0.97   Sodium 135 - 145 mmol/L 136  135  136   Potassium 3.5 - 5.1 mmol/L 3.9  4.1  4.4   Chloride 98 - 111 mmol/L 104  104  103   CO2 22 - 32 mmol/L 25  25  25   Calcium 8.9 - 10.3 mg/dL 8.3  8.4  8.8   Total Protein 6.5 - 8.1 g/dL   5.7   Total Bilirubin 0.3 - 1.2 mg/dL   <0.1   Alkaline Phos 38 - 126 U/L   98   AST 15 - 41 U/L   44   ALT 0 - 44 U/L   40     Renal function CrCl cannot be calculated (Patient's most recent lab result is older than the maximum 21 days allowed.).  Hgb [name] MFr Bld (%)  Date Value  01/07/2022 5.9 (H)    LDL Calculated  Date Value Ref Range Status  04/16/2016 43 0 - 99 mg/dL Final     Hodari Chuba N. Honestee Revard, MD FACS [facility] [place] Office Phone Number: [phone] 07/08/2022 4:59 PM   Total time spent on preparing this encounter including chart review, data  review, collecting history, examining the patient, coordinating care for this new patient, 45 minutes.  Portions of this report may have been transcribed using voice recognition software.  Every effort has been made to ensure accuracy; however, inadvertent computerized transcription errors may still be present.   

## 2022-07-08 NOTE — H&P (View-Only) (Signed)
[facility] [place]  ASSESSMENT / PLAN: 72 y.o. female with history of DVT/PE on Eliquis.  She has undergone VATS decortication for loculated effusion.  She feels much better overall.  We will plan to retrieve her IVC filter in the operating room in the near future.  CHIEF COMPLAINT: IVC filter in place  HISTORY OF PRESENT ILLNESS: Alyssa Peterson is a 72 y.o. female referred to clinic for evaluation of possible IVC filter placement.  The patient is a very pleasant woman who underwent laparoscopic esophageal hernia repair.  This complicated by large pleural effusion which required tube thoracostomy.  The effusion reaccumulated, and is now loculated.  Dr. Dorris Fetch saw the patient earlier today, and recommended decortication.  The patient's history is also significant for pulmonary embolism, for which she is on Eliquis.  This was recently diagnosed, and the patient needs to complete a course of treatment before discontinuing her anticoagulation altogether.  For this reason the IVC filter was requested.  07/08/22: Returns to office for follow-up after IVC filter placement and VATS decortication for loculated effusion.  Overall she is doing very well.  Her breathing is improved.  She feels well.  I reviewed the importance of retrieving the IVC filter.  She is in agreement.  Past Medical History:  Diagnosis Date   Anemia    Hx   Arthritis    Diaphragmatic hernia with obstruction    Gastric volvulus    GERD (gastroesophageal reflux disease)    History of hiatal hernia    Hypertension    Inguinal hernia    left   Pulmonary embolism     Past Surgical History:  Procedure Laterality Date   COLONOSCOPY     CYST REMOVAL NECK     DENTAL SURGERY     FEMORAL HERNIA REPAIR Left 07/17/2016   Procedure: LEFT FEMORAL  HERNIA REPAIR WITH MESH;  Surgeon: Manus Rudd, MD;  Location: [facility];  Service: General;  Laterality: Left;   GASTROJEJUNOSTOMY N/A  07/04/2020   Procedure: GASTROSTOMY TUBE;  Surgeon: Berna Bue, MD;  Location: [facility];  Service: General;  Laterality: N/A;   GASTROSTOMY N/A 03/27/2022   Procedure: INSERTION OF GASTROSTOMY TUBE;  Surgeon: Berna Bue, MD;  [facility];  Service: General;  Laterality: N/A;   HIATAL HERNIA REPAIR N/A 03/27/2022   Procedure: LAPAROSCOPIC REPAIR OF HIATAL HERNIA;  Surgeon: Berna Bue, MD;  [facility]: [facility];  Service: General;  Laterality: N/A;   IVC FILTER INSERTION N/A 05/02/2022   Procedure: IVC FILTER INSERTION;  Surgeon: [name], MD;  [facility];  Service: Cardiovascular;  Laterality: N/A;   IVC VENOGRAPHY N/A 05/02/2022   Procedure: IVC Venography;  Surgeon: [name], MD;  [facility];  Service: Cardiovascular;  Laterality: N/A;   LAPAROSCOPIC LYSIS OF ADHESIONS N/A 03/27/2022   Procedure: LAPAROSCOPIC LYSIS OF ADHESIONS;  Surgeon: Berna Bue, MD;  [facility];  Service: General;  Laterality: N/A;   LAPAROTOMY N/A 07/04/2020   Procedure: EXPLORATORY LAPAROTOMY;  Surgeon: Berna Bue, MD;  [facility];  Service: General;  Laterality: N/A;   LASIK  2023   PLEURAL EFFUSION DRAINAGE Right 05/07/2022   Procedure: DRAINAGE OF PLEURAL EFFUSION;  Surgeon: Loreli Slot, MD;  [facility];  Service: Thoracic;  Laterality: Right;   VIDEO ASSISTED THORACOSCOPY (VATS)/DECORTICATION Right 05/07/2022   Procedure: VIDEO ASSISTED THORACOSCOPY (VATS)/DECORTICATION;  Surgeon: Loreli Slot, MD;  [facility]: [facility];  Service: Thoracic;  Laterality: Right;    History reviewed. No pertinent family history.  Social History   Socioeconomic History   Marital status: Divorced    Spouse name: Not on file   Number of children: Not on file   Years of education: Not on file   Highest education level: Not on file  Occupational History   Not on file  Tobacco Use   Smoking status: Never   Smokeless tobacco:  Never  Vaping Use   Vaping Use: Never used  Substance and Sexual Activity   Alcohol use: No    Alcohol/week: 0.0 standard drinks of alcohol   Drug use: No   Sexual activity: Not Currently  Other Topics Concern   Not on file  Social History Narrative   Not on file   Social Determinants of Health   Financial Resource Strain: Low Risk  (06/18/2022)   Overall Financial Resource Strain (CARDIA)    Difficulty of Paying Living Expenses: Not hard at all  Food Insecurity: No Food Insecurity (06/18/2022)   Hunger Vital Sign    Worried About Running Out of Food in the Last Year: Never true    Ran Out of Food in the Last Year: Never true  Transportation Needs: No Transportation Needs (06/18/2022)   PRAPARE - Administrator, Civil Service (Medical): No    Lack of Transportation (Non-Medical): No  Physical Activity: Inactive (06/18/2022)   Exercise Vital Sign    Days of Exercise per Week: 0 days    Minutes of Exercise per Session: 0 min  Stress: No Stress Concern Present (06/18/2022)   [employer] of Occupational Health - Occupational Stress Questionnaire    Feeling of Stress : Not at all  Social Connections: Not on file  Intimate Partner Violence: Not At Risk (03/24/2022)   Humiliation, Afraid, Rape, and Kick questionnaire    Fear of Current or Ex-Partner: No    Emotionally Abused: No    Physically Abused: No    Sexually Abused: No    Allergies  Allergen Reactions   Latex Itching    hands    Current Outpatient Medications  Medication Sig Dispense Refill   acetaminophen (TYLENOL) 325 MG tablet Take 2 tablets (650 mg total) by mouth every 6 (six) hours as needed for mild pain (or temp > 100).     amLODipine (NORVASC) 10 MG tablet Take 1 tablet (10 mg total) by mouth at bedtime.     apixaban (ELIQUIS) 5 MG TABS tablet Take 5 mg by mouth 2 (two) times daily.     gabapentin (NEURONTIN) 300 MG capsule Take 1 capsule (300 mg total) by mouth at bedtime. 30 capsule 0    LUMIGAN 0.01 % SOLN Place 1 drop into both eyes at bedtime.     traMADol (ULTRAM) 50 MG tablet Take 1 tablet (50 mg total) by mouth every 6 (six) hours as needed for moderate pain (mild pain). 28 tablet 0   No current facility-administered medications for this visit.    PHYSICAL EXAM Vitals:   07/08/22 0928  BP: 109/69  Pulse: 87  Resp: 18  Temp: 98 F (36.7 C)  SpO2: 97%  Weight: 112 lb (50.8 kg)  Height: 5' (1.524 m)   Thin woman in no acute distress Regular rate and rhythm Unlabored breathing  PERTINENT LABORATORY AND RADIOLOGIC DATA  Most recent CBC    Latest Ref Rng & Units 05/12/2022   12:23 AM 05/09/2022   12:34 AM  05/08/2022   12:14 AM  CBC  WBC 4.0 - 10.5 K/uL 4.4  14.4  7.4   Hemoglobin 12.0 - 15.0 g/dL 9.6  16.1  09.6   Hematocrit 36.0 - 46.0 % 31.1  33.7  31.4   Platelets 150 - 400 K/uL 294  433  350      Most recent CMP    Latest Ref Rng & Units 05/12/2022   12:23 AM 05/11/2022   12:18 AM 05/09/2022   12:34 AM  CMP  Glucose 70 - 99 mg/dL 93  045  409   BUN 8 - 23 mg/dL Creatinine 0.44 - 1.00 mg/dL 8.11  9.14  7.82   Sodium 135 - 145 mmol/L 136  135  136   Potassium 3.5 - 5.1 mmol/L 3.9  4.1  4.4   Chloride 98 - 111 mmol/L 104  104  103   CO2 22 - 32 mmol/L Calcium 8.9 - 10.3 mg/dL 8.3  8.4  8.8   Total Protein 6.5 - 8.1 g/dL   5.7   Total Bilirubin 0.3 - 1.2 mg/dL   <9.5   Alkaline Phos 38 - 126 U/L   98   AST 15 - 41 U/L   44   ALT 0 - 44 U/L   40     Renal function CrCl cannot be calculated (Patient's most recent lab result is older than the maximum 21 days allowed.).  Hgb [name] MFr Bld (%)  Date Value  01/07/2022 5.9 (H)    LDL Calculated  Date Value Ref Range Status  04/16/2016 43 0 - 99 mg/dL Final     Rande Brunt. Lenell Antu, MD [facility] Phone Number: [phone] 07/08/2022 4:59 PM   Total time spent on preparing this encounter including chart review, data  review, collecting history, examining the patient, coordinating care for this new patient, 45 minutes.  Portions of this report may have been transcribed using voice recognition software.  Every effort has been made to ensure accuracy; however, inadvertent computerized transcription errors may still be present.

## 2022-07-09 ENCOUNTER — Other Ambulatory Visit: Payer: Self-pay | Admitting: Physician Assistant

## 2022-07-14 ENCOUNTER — Other Ambulatory Visit: Payer: Self-pay | Admitting: Thoracic Surgery (Cardiothoracic Vascular Surgery)

## 2022-07-14 DIAGNOSIS — I2699 Other pulmonary embolism without acute cor pulmonale: Secondary | ICD-10-CM

## 2022-07-15 ENCOUNTER — Ambulatory Visit
Admission: RE | Admit: 2022-07-15 | Discharge: 2022-07-15 | Disposition: A | Discharge status: Home / Self Care | Payer: Medicare Other | Source: Ambulatory Visit | Attending: Thoracic Surgery (Cardiothoracic Vascular Surgery) | Admitting: Thoracic Surgery (Cardiothoracic Vascular Surgery)

## 2022-07-15 ENCOUNTER — Ambulatory Visit (INDEPENDENT_AMBULATORY_CARE_PROVIDER_SITE_OTHER): Payer: Self-pay | Admitting: Thoracic Surgery (Cardiothoracic Vascular Surgery)

## 2022-07-15 ENCOUNTER — Encounter: Payer: Self-pay | Admitting: Thoracic Surgery (Cardiothoracic Vascular Surgery)

## 2022-07-15 VITALS — BP 124/85 | HR 80 | Resp 20 | Ht 60.0 in | Wt 112.0 lb

## 2022-07-15 DIAGNOSIS — M419 Scoliosis, unspecified: Secondary | ICD-10-CM | POA: Diagnosis not present

## 2022-07-15 DIAGNOSIS — J9811 Atelectasis: Secondary | ICD-10-CM | POA: Diagnosis not present

## 2022-07-15 DIAGNOSIS — Z09 Encounter for follow-up examination after completed treatment for conditions other than malignant neoplasm: Secondary | ICD-10-CM

## 2022-07-15 DIAGNOSIS — Z86711 Personal history of pulmonary embolism: Secondary | ICD-10-CM | POA: Diagnosis not present

## 2022-07-15 DIAGNOSIS — I2699 Other pulmonary embolism without acute cor pulmonale: Secondary | ICD-10-CM

## 2022-07-15 NOTE — Progress Notes (Signed)
[address]       [place] [address]             [phone]    HPI: Alyssa Peterson returns for a scheduled follow-up visit after decortication.  Alyssa Peterson is a 72 year old woman with a history of hypertension, anemia, reflux, paraesophageal hernia, pleural effusion, and pulmonary embolus.  She presented on 03/24/2022 with abdominal pain was found to have a paraesophageal hernia with gastric volvulus.  Dr. Doylene Canard did an laparoscopic repair.  She had a large right pleural effusion postoperatively which became loculated.  A CT showed a pulmonary embolus and she was started on anticoagulation.  Dr. Lenell Antu placed an IVC filter so we can hold her anticoagulation and then she underwent a right VATS for decortication on 05/07/2022.  Pathology showed only inflammation.  She did well and went home postoperative day #5.  I saw her back in the office in early March.  She was doing well from a respiratory standpoint.  She was having some incisional discomfort.  In the interim since her last visit she has been doing well.  Still has some incisional discomfort but has improved.  Not limiting her activities.  She is having her IVC filter removed on Thursday.  Past Medical History:  Diagnosis Date   Anemia    Hx   Arthritis    Diaphragmatic hernia with obstruction    Gastric volvulus    GERD (gastroesophageal reflux disease)    History of hiatal hernia    Hypertension    Inguinal hernia    left   Pulmonary embolism     Current Outpatient Medications  Medication Sig Dispense Refill   acetaminophen (TYLENOL) 325 MG tablet Take 2 tablets (650 mg total) by mouth every 6 (six) hours as needed for mild pain (or temp > 100).     amLODipine (NORVASC) 10 MG tablet Take 1 tablet (10 mg total) by mouth at bedtime.     apixaban (ELIQUIS) 5 MG TABS tablet Take 5 mg by mouth 2 (two) times daily.     gabapentin (NEURONTIN) 300 MG capsule Take 1 capsule (300 mg total) by mouth at  bedtime. 30 capsule 0   LUMIGAN 0.01 % SOLN Place 1 drop into both eyes at bedtime.     traMADol (ULTRAM) 50 MG tablet Take 1 tablet (50 mg total) by mouth every 6 (six) hours as needed for moderate pain (mild pain). 28 tablet 0   No current facility-administered medications for this visit.    Physical Exam BP 124/85 (BP Location: Left Arm, Patient Position: Sitting, Cuff Size: Normal)   Pulse 80   Resp 20   Ht 5' (1.524 m)   Wt 112 lb (50.8 kg)   SpO2 97% Comment: RA  BMI 21.46 kg/m  72 year old woman in no acute distress Alert and oriented x 3 with no focal deficits Lungs clear with equal breath sounds bilaterally Incisions healing well Cardiac regular rate and rhythm  Diagnostic Tests: CHEST - 2 VIEW   COMPARISON:  05/27/2022   FINDINGS: Normal heart size, mediastinal contours, and pulmonary vascularity.   Subsegmental atelectasis RIGHT middle lobe unchanged.   Remaining lungs clear.   No acute infiltrate, pleural effusion, or pneumothorax.   Biconvex thoracic scoliosis.   IMPRESSION: Subsegmental atelectasis RIGHT middle lobe.     Electronically Signed   By: Ulyses Southward M.D.   On: 07/15/2022 10:06   I personally reviewed the chest x-ray.  Good result post  decortication.  Impression: Alyssa Peterson is a 72 year old woman with a history of hypertension, anemia, reflux, paraesophageal hernia, pleural effusion, and pulmonary embolus.    Loculated right pleural effusion status post repair of paraesophageal hernia-underwent drainage of effusion and decortication on 05/07/2022.  Good result clinically and radiographically.  No additional follow-up is indicated unless she develops recurrent symptoms.  PE-noted on CT.  On Eliquis.  Had IVC filter prior to decortication.  Scheduled to have that removed on Thursday with Dr. Lenell Antu.  There are no restrictions on her activities from my standpoint.  Plan: I will be happy to see Alyssa Peterson back at anytime in the  future if I can be of any further assistance with her care.  Loreli Slot, MD [facility] [phone]

## 2022-07-16 NOTE — Anesthesia Preprocedure Evaluation (Signed)
Anesthesia Evaluation  Patient identified by MRN, date of birth, ID band Patient awake    Reviewed: Allergy & Precautions, NPO status , Patient's Chart, lab work & pertinent test results  History of Anesthesia Complications Negative for: history of anesthetic complications  Airway Mallampati: II  TM Distance: >3 FB Neck ROM: Full    Dental no notable dental hx. (+) Teeth Intact, Dental Advisory Given   Pulmonary PE  Hx diaphragmatic hernia Rt pleural effusion    Pulmonary exam normal        Cardiovascular hypertension, Pt. on medications Normal cardiovascular exam     Neuro/Psych negative neurological ROS  negative psych ROS   GI/Hepatic Neg liver ROS, hiatal hernia,GERD  Controlled,,  Endo/Other  negative endocrine ROS    Renal/GU negative Renal ROS     Musculoskeletal  (+) Arthritis ,    Abdominal   Peds  Hematology  (+) Blood dyscrasia, anemia  On eliquis    Anesthesia Other Findings S/p IVC filter placement given recent PE on eliquis - see PAT note  Reproductive/Obstetrics                             Anesthesia Physical Anesthesia Plan  ASA: 3  Anesthesia Plan: General   Post-op Pain Management: Tylenol PO (pre-op)*   Induction: Intravenous  PONV Risk Score and Plan: 3 and Treatment may vary due to age or medical condition, Ondansetron and Dexamethasone  Airway Management Planned: Oral ETT  Additional Equipment: ClearSight  Intra-op Plan:   Post-operative Plan: Extubation in OR  Informed Consent: I have reviewed the patients History and Physical, chart, labs and discussed the procedure including the risks, benefits and alternatives for the proposed anesthesia with the patient or authorized representative who has indicated his/her understanding and acceptance.     Dental advisory given  Plan Discussed with: Anesthesiologist and CRNA  Anesthesia Plan Comments:  (PAT note written 07/16/2022 by Shonna Chock, PA-C.  )        Anesthesia Quick Evaluation

## 2022-07-16 NOTE — Progress Notes (Signed)
Patient was called with questions and instructions for the surgery day on different phone numbers listed on her chart, with no success. This writer left a message with instructions for the surgery day on [phone].   -------------  SDW INSTRUCTIONS given:  Your procedure is scheduled on Thursday, April 25th, 2024.  Report to [facility] Main Entrance "A" at 09:30 A.M., and check in at the Admitting office.  Call this number if you have problems the morning of surgery:  [phone]   Remember:  Do not eat or drink after midnight the night before your surgery    Take these medicines the morning of surgery with A SIP OF WATER  - NONE  As of today, STOP taking any Aspirin (unless otherwise instructed by your surgeon) Aleve, Naproxen, Ibuprofen, Motrin, Advil, Goody's, BC's, all herbal medications, fish oil, and all vitamins.  Hold Eliquis 1 day prior to your surgery per MD.   The day of surgery:                      Do not wear jewelry, make up, or nail polish            Do not wear lotions, powders, perfumes, or deodorant.            Do not shave 48 hours prior to surgery.              Do not bring valuables to the hospital.            [facility] is not responsible for any belongings or valuables.  Do NOT Smoke (Tobacco/Vaping) 24 hours prior to your procedure If you use a CPAP at night, you may bring all equipment for your overnight stay.   Contacts, glasses, dentures or bridgework may not be worn into surgery.      For patients admitted to the hospital, discharge time will be determined by your treatment team.   Patients discharged the day of surgery will not be allowed to drive home, and someone needs to stay with them for 24 hours.    Special instructions:   [place]- Preparing For Surgery  Before surgery, you can play an important role. Because skin is not sterile, your skin needs to be as free of germs as possible. You can reduce the number of germs on your skin by  washing with CHG (chlorahexidine gluconate) Soap before surgery.  CHG is an antiseptic cleaner which kills germs and bonds with the skin to continue killing germs even after washing.    Oral Hygiene is also important to reduce your risk of infection.  Remember - BRUSH YOUR TEETH THE MORNING OF SURGERY WITH YOUR REGULAR TOOTHPASTE  Please do not use if you have an allergy to CHG or antibacterial soaps. If your skin becomes reddened/irritated stop using the CHG.  Do not shave (including legs and underarms) for at least 48 hours prior to first CHG shower. It is OK to shave your face.  Please follow these instructions carefully.   Shower the NIGHT BEFORE SURGERY and the MORNING OF SURGERY with DIAL Soap.   Pat yourself dry with a CLEAN TOWEL.  Wear CLEAN PAJAMAS to bed the night before surgery  Place CLEAN SHEETS on your bed the night of your first shower and DO NOT SLEEP WITH PETS.   Day of Surgery: Please shower morning of surgery  Wear Clean/Comfortable clothing the morning of surgery Do not apply any deodorants/lotions.   Remember to brush  your teeth WITH YOUR REGULAR TOOTHPASTE.   Questions were answered. Patient verbalized understanding of instructions.

## 2022-07-16 NOTE — Progress Notes (Signed)
Anesthesia Chart Review: Alyssa Peterson  Case: [id number] Date/Time: 07/17/22 1139   Procedure: VENA CAVA FILTER REMOVAL   Anesthesia type: Choice   Pre-op diagnosis: Completion of treatment   [facility] ROOM 16 / [facility] OR   Surgeons: [name], MD       DISCUSSION: Patient is a 72 year old female scheduled for the above procedure.  History includes never smoker, HTN, GERD, paraesophageal hernia with gastric perforation (s/p exploratory laparotomy, reduction of hernia, repair of gastric gastric perforation, G-tube 07/04/20; laparoscopic LOA, reduction of incarcerated giant paraesophageal hernia and detorsion of gastric volvulus, wedge resection of gastric fundus, repair of hiatal hernia, UHR, G-tube 03/27/22), loculated right pleural effusion (post-paraesophageal hernia repair, s/p Right VATS, drainage/decortication 05/07/22), PE (insertion of IVC filter 05/02/22), left femoral hernia (s/p repair 07/17/16).  She presented on New Year's Day 2024 with abdominal pain.  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  She underwent laparoscopic repair by Dr. Doylene Canard on 03/27/22. Post-operatively she developed pelvic and subpulmonic fluid requiring percutaneous drains. 04/25/22 CT at the [facility] showed an incidental small burden LLL PE, and she was started on anticoagulation. The large right pleural effusion became loculated, and she required right VATS for drainage and decortication. An IVC filter was placed pre-procedure on 05/02/22.  Per VVS, hold Eliquis for one day prior to procedure. Anesthesia team to evaluate on the day of surgery.    VS:  BP Readings from Last 3 Encounters:  07/15/22 124/85  07/08/22 109/69  06/06/22 116/73   Pulse Readings from Last 3 Encounters:  07/15/22 80  07/08/22 87  06/06/22 74     PROVIDERS: Claiborne Rigg, NP is PCP  Charlett Lango, MD is CT surgeon. As needed follow-up per 07/15/22 visit.   LABS: For day of surgery as  indicated. Last results in [facility] include: Lab Results  Component Value Date   WBC 4.4 05/12/2022   HGB 9.6 (L) 05/12/2022   HCT 31.1 (L) 05/12/2022   PLT 294 05/12/2022   GLUCOSE 93 05/12/2022   ALT 40 05/09/2022   AST 44 (H) 05/09/2022   NA 136 05/12/2022   K 3.9 05/12/2022   CL 104 05/12/2022   CREATININE 0.68 05/12/2022   BUN 11 05/12/2022   CO2 25 05/12/2022   TSH 0.669 01/07/2022   INR 1.3 (H) 05/05/2022   HGBA1C 5.9 (H) 01/07/2022     IMAGES: FINDINGS: Normal heart size, mediastinal contours, and pulmonary vascularity. Subsegmental atelectasis RIGHT middle lobe unchanged. Remaining lungs clear. No acute infiltrate, pleural effusion, or pneumothorax. Biconvex thoracic scoliosis. IMPRESSION: Subsegmental atelectasis RIGHT middle lobe.   EKG: EKG 05/05/22: Sinus tachycardia Left axis deviation Pulmonary disease pattern Nonspecific T wave abnormality Abnormal ECG When compared with ECG of 28-Mar-2022 06:40, PREVIOUS ECG IS PRESENT No significant change was found Confirmed by Charlton Haws [phone]) on 05/05/2022 9:10:03 PM   CV: N/A  Past Medical History:  Diagnosis Date   Anemia    Hx   Arthritis    Diaphragmatic hernia with obstruction    Gastric volvulus    GERD (gastroesophageal reflux disease)    History of hiatal hernia    Hypertension    Inguinal hernia    left   Pulmonary embolism     Past Surgical History:  Procedure Laterality Date   COLONOSCOPY     CYST REMOVAL NECK     DENTAL SURGERY     FEMORAL HERNIA REPAIR Left 07/17/2016  Procedure: LEFT FEMORAL  HERNIA REPAIR WITH MESH;  Surgeon: Manus Rudd, MD;  Location: [facility];  Service: General;  Laterality: Left;   GASTROJEJUNOSTOMY N/A 07/04/2020   Procedure: GASTROSTOMY TUBE;  Surgeon: Berna Bue, MD;  Location: [facility] OR;  Service: General;  Laterality: N/A;   GASTROSTOMY N/A 03/27/2022   Procedure: INSERTION OF GASTROSTOMY TUBE;  Surgeon: Berna Bue, MD;   Location[facility];  Service: General;  Laterality: N/A;   HIATAL HERNIA REPAIR N/A 03/27/2022   Procedure: LAPAROSCOPIC REPAIR OF HIATAL HERNIA;  Surgeon: Berna Bue, MD;  [facility];  Service: General;  Laterality: N/A;   IVC FILTER INSERTION N/A 05/02/2022   Procedure: IVC FILTER INSERTION;  Surgeon: [name], MD;  [facility] INVASIVE CV [facility];  Service: Cardiovascular;  Laterality: N/A;   IVC VENOGRAPHY N/A 05/02/2022   Procedure: IVC Venography;  Surgeon: [name], MD;  [facility];  Service: Cardiovascular;  Laterality: N/A;   LAPAROSCOPIC LYSIS OF ADHESIONS N/A 03/27/2022   Procedure: LAPAROSCOPIC LYSIS OF ADHESIONS;  Surgeon: Berna Bue, MD;  [facility];  Service: General;  Laterality: N/A;   LAPAROTOMY N/A 07/04/2020   Procedure: EXPLORATORY LAPAROTOMY;  Surgeon: Berna Bue, MD;  [facility];  Service: General;  Laterality: N/A;   LASIK  2023   PLEURAL EFFUSION DRAINAGE Right 05/07/2022   Procedure: DRAINAGE OF PLEURAL EFFUSION;  Surgeon: Loreli Slot, MD;  Location: [facility];  Service: Thoracic;  Laterality: Right;   VIDEO ASSISTED THORACOSCOPY (VATS)/DECORTICATION Right 05/07/2022   Procedure: VIDEO ASSISTED THORACOSCOPY (VATS)/DECORTICATION;  Surgeon: Loreli Slot, MD;  Location: [facility] OR;  Service: Thoracic;  Laterality: Right;    MEDICATIONS: No current facility-administered medications for this encounter.    acetaminophen (TYLENOL) 325 MG tablet   amLODipine (NORVASC) 10 MG tablet   apixaban (ELIQUIS) 5 MG TABS tablet   gabapentin (NEURONTIN) 300 MG capsule   LUMIGAN 0.01 % SOLN   traMADol (ULTRAM) 50 MG tablet    Shonna Chock, PA-C Surgical Short Stay/Anesthesiology [facility] Phone [phone] [facility] Phone [phone] 07/16/2022 11:01 AM

## 2022-07-17 ENCOUNTER — Encounter (HOSPITAL_COMMUNITY): Payer: Self-pay | Admitting: Vascular Surgery

## 2022-07-17 ENCOUNTER — Encounter: Payer: Self-pay | Admitting: Vascular Surgery

## 2022-07-17 ENCOUNTER — Encounter (HOSPITAL_COMMUNITY)
Admission: RE | Disposition: A | Discharge status: Home / Self Care | Payer: Self-pay | Source: Home / Self Care | Attending: Vascular Surgery

## 2022-07-17 ENCOUNTER — Ambulatory Visit (HOSPITAL_COMMUNITY): Payer: Medicare Other | Admitting: Vascular Surgery

## 2022-07-17 ENCOUNTER — Ambulatory Visit (HOSPITAL_COMMUNITY)
Admission: RE | Admit: 2022-07-17 | Discharge: 2022-07-17 | Disposition: A | Discharge status: Home / Self Care | Payer: Medicare Other | Attending: Vascular Surgery | Admitting: Vascular Surgery

## 2022-07-17 ENCOUNTER — Other Ambulatory Visit: Payer: Self-pay

## 2022-07-17 ENCOUNTER — Ambulatory Visit (HOSPITAL_BASED_OUTPATIENT_CLINIC_OR_DEPARTMENT_OTHER): Payer: Medicare Other | Admitting: Vascular Surgery

## 2022-07-17 ENCOUNTER — Ambulatory Visit (HOSPITAL_COMMUNITY): Payer: Medicare Other

## 2022-07-17 DIAGNOSIS — D649 Anemia, unspecified: Secondary | ICD-10-CM | POA: Insufficient documentation

## 2022-07-17 DIAGNOSIS — I2699 Other pulmonary embolism without acute cor pulmonale: Secondary | ICD-10-CM | POA: Diagnosis not present

## 2022-07-17 DIAGNOSIS — I10 Essential (primary) hypertension: Secondary | ICD-10-CM | POA: Insufficient documentation

## 2022-07-17 DIAGNOSIS — D759 Disease of blood and blood-forming organs, unspecified: Secondary | ICD-10-CM | POA: Diagnosis not present

## 2022-07-17 DIAGNOSIS — Z86718 Personal history of other venous thrombosis and embolism: Secondary | ICD-10-CM | POA: Insufficient documentation

## 2022-07-17 DIAGNOSIS — Z86711 Personal history of pulmonary embolism: Secondary | ICD-10-CM | POA: Diagnosis not present

## 2022-07-17 DIAGNOSIS — J90 Pleural effusion, not elsewhere classified: Secondary | ICD-10-CM | POA: Diagnosis not present

## 2022-07-17 DIAGNOSIS — Z4589 Encounter for adjustment and management of other implanted devices: Secondary | ICD-10-CM | POA: Insufficient documentation

## 2022-07-17 DIAGNOSIS — Z7901 Long term (current) use of anticoagulants: Secondary | ICD-10-CM | POA: Insufficient documentation

## 2022-07-17 DIAGNOSIS — Z4509 Encounter for adjustment and management of other cardiac device: Secondary | ICD-10-CM | POA: Diagnosis not present

## 2022-07-17 DIAGNOSIS — I2782 Chronic pulmonary embolism: Secondary | ICD-10-CM

## 2022-07-17 LAB — CBC
HCT: 35.8 % — ABNORMAL LOW (ref 36.0–46.0)
Hemoglobin: 11.1 g/dL — ABNORMAL LOW (ref 12.0–15.0)
MCH: 25.3 pg — ABNORMAL LOW (ref 26.0–34.0)
MCHC: 31 g/dL (ref 30.0–36.0)
MCV: 81.5 fL (ref 80.0–100.0)
Platelets: 264 10*3/uL (ref 150–400)
RBC: 4.39 MIL/uL (ref 3.87–5.11)
RDW: 15.9 % — ABNORMAL HIGH (ref 11.5–15.5)
WBC: 4 10*3/uL (ref 4.0–10.5)
nRBC: 0 % (ref 0.0–0.2)

## 2022-07-17 SURGERY — REMOVAL, FILTER, VENA CAVA
Anesthesia: General

## 2022-07-17 MED ORDER — FENTANYL CITRATE (PF) 250 MCG/5ML IJ SOLN
INTRAMUSCULAR | Status: DC | PRN
  Administered 2022-07-17: 100 ug via INTRAVENOUS

## 2022-07-17 MED ORDER — CEFAZOLIN SODIUM-DEXTROSE 2-4 GM/100ML-% IV SOLN
INTRAVENOUS | Status: AC
  Filled 2022-07-17: qty 100

## 2022-07-17 MED ORDER — PHENYLEPHRINE HCL-NACL 20-0.9 MG/250ML-% IV SOLN
INTRAVENOUS | Status: DC | PRN
  Administered 2022-07-17: 50 ug/min via INTRAVENOUS

## 2022-07-17 MED ORDER — IODIXANOL 320 MG/ML IV SOLN
INTRAVENOUS | Status: DC | PRN
  Administered 2022-07-17: 10 mL via INTRAVENOUS

## 2022-07-17 MED ORDER — ROCURONIUM BROMIDE 10 MG/ML (PF) SYRINGE
PREFILLED_SYRINGE | INTRAVENOUS | Status: DC | PRN
  Administered 2022-07-17: 40 mg via INTRAVENOUS

## 2022-07-17 MED ORDER — CHLORHEXIDINE GLUCONATE 0.12 % MT SOLN: 15.0000 mL | Freq: Once | OROMUCOSAL | Status: AC

## 2022-07-17 MED ORDER — LIDOCAINE 2% (20 MG/ML) 5 ML SYRINGE
INTRAMUSCULAR | Status: DC | PRN
  Administered 2022-07-17: 100 mg via INTRAVENOUS

## 2022-07-17 MED ORDER — CEFAZOLIN SODIUM-DEXTROSE 2-4 GM/100ML-% IV SOLN
2.0000 g | INTRAVENOUS | Status: AC
  Administered 2022-07-17: 2 g via INTRAVENOUS

## 2022-07-17 MED ORDER — PHENYLEPHRINE 80 MCG/ML (10ML) SYRINGE FOR IV PUSH (FOR BLOOD PRESSURE SUPPORT)
PREFILLED_SYRINGE | INTRAVENOUS | Status: DC | PRN
  Administered 2022-07-17 (×2): 80 ug via INTRAVENOUS
  Administered 2022-07-17: 160 ug via INTRAVENOUS

## 2022-07-17 MED ORDER — CHLORHEXIDINE GLUCONATE 0.12 % MT SOLN
OROMUCOSAL | Status: AC
  Administered 2022-07-17: 15 mL via OROMUCOSAL
  Filled 2022-07-17: qty 15

## 2022-07-17 MED ORDER — ONDANSETRON HCL 4 MG/2ML IJ SOLN
INTRAMUSCULAR | Status: DC | PRN
  Administered 2022-07-17: 4 mg via INTRAVENOUS

## 2022-07-17 MED ORDER — SODIUM CHLORIDE 0.9 % IV SOLN: INTRAVENOUS | Status: DC

## 2022-07-17 MED ORDER — PROMETHAZINE HCL 25 MG/ML IJ SOLN: 6.2500 mg | INTRAMUSCULAR | Status: DC | PRN

## 2022-07-17 MED ORDER — ACETAMINOPHEN 500 MG PO TABS
1000.0000 mg | ORAL_TABLET | Freq: Once | ORAL | Status: AC
  Administered 2022-07-17: 1000 mg via ORAL
  Filled 2022-07-17: qty 2

## 2022-07-17 MED ORDER — DEXAMETHASONE SODIUM PHOSPHATE 10 MG/ML IJ SOLN
INTRAMUSCULAR | Status: DC | PRN
  Administered 2022-07-17: 10 mg via INTRAVENOUS

## 2022-07-17 MED ORDER — CHLORHEXIDINE GLUCONATE 4 % EX SOLN: 60.0000 mL | Freq: Once | CUTANEOUS | Status: DC

## 2022-07-17 MED ORDER — LACTATED RINGERS IV SOLN: INTRAVENOUS | Status: DC

## 2022-07-17 MED ORDER — FENTANYL CITRATE (PF) 100 MCG/2ML IJ SOLN: 25.0000 ug | INTRAMUSCULAR | Status: DC | PRN

## 2022-07-17 MED ORDER — HEPARIN 6000 UNIT IRRIGATION SOLUTION
Status: AC
  Filled 2022-07-17: qty 500

## 2022-07-17 MED ORDER — HYDROCODONE-ACETAMINOPHEN 5-325 MG PO TABS: 1.0000 | ORAL_TABLET | ORAL | 0 refills | Status: DC | PRN

## 2022-07-17 MED ORDER — FENTANYL CITRATE (PF) 250 MCG/5ML IJ SOLN
INTRAMUSCULAR | Status: AC
  Filled 2022-07-17: qty 5

## 2022-07-17 MED ORDER — AMISULPRIDE (ANTIEMETIC) 5 MG/2ML IV SOLN: 10.0000 mg | Freq: Once | INTRAVENOUS | Status: DC | PRN

## 2022-07-17 MED ORDER — ORAL CARE MOUTH RINSE: 15.0000 mL | Freq: Once | OROMUCOSAL | Status: AC

## 2022-07-17 MED ORDER — PROPOFOL 10 MG/ML IV BOLUS
INTRAVENOUS | Status: AC
  Filled 2022-07-17: qty 20

## 2022-07-17 MED ORDER — HEPARIN 6000 UNIT IRRIGATION SOLUTION
Status: DC | PRN
  Administered 2022-07-17: 1

## 2022-07-17 MED ORDER — SUGAMMADEX SODIUM 200 MG/2ML IV SOLN
INTRAVENOUS | Status: DC | PRN
  Administered 2022-07-17: 200 mg via INTRAVENOUS

## 2022-07-17 MED ORDER — PROPOFOL 10 MG/ML IV BOLUS
INTRAVENOUS | Status: DC | PRN
  Administered 2022-07-17: 80 mg via INTRAVENOUS

## 2022-07-17 SURGICAL SUPPLY — 25 items
APL PRP STRL LF DISP 70% ISPRP (MISCELLANEOUS) ×1
CANISTER SUCT 3000ML PPV (MISCELLANEOUS) ×1 IMPLANT
CHLORAPREP W/TINT 26 (MISCELLANEOUS) ×1 IMPLANT
COVER ULTRASOUND PROBE 36 ST (MISCELLANEOUS) IMPLANT
DEVICE ENSNARE  12MMX20MM (VASCULAR PRODUCTS)
DEVICE ENSNARE 12MMX20MM (VASCULAR PRODUCTS) IMPLANT
DRSG TEGADERM 4X4.75 (GAUZE/BANDAGES/DRESSINGS) IMPLANT
GAUZE 4X4 16PLY ~~LOC~~+RFID DBL (SPONGE) IMPLANT
GAUZE SPONGE 4X4 12PLY STRL LF (GAUZE/BANDAGES/DRESSINGS) IMPLANT
GLIDEWIRE ADV .035X180CM (WIRE) IMPLANT
KIT MICROPUNCTURE NIT STIFF (SHEATH) ×1 IMPLANT
NDL 22X1.5 STRL (OR ONLY) (MISCELLANEOUS) ×1 IMPLANT
NEEDLE 22X1.5 STRL (OR ONLY) (MISCELLANEOUS) ×1 IMPLANT
PACK ENDO MINOR (CUSTOM PROCEDURE TRAY) ×1 IMPLANT
SET CLOVERSNARE FLT RETRIEVAL (MISCELLANEOUS) IMPLANT
SHEATH DRYSEAL FLEX 12FR 33CM (SHEATH) IMPLANT
SNARE GOOSENECK 10MM (VASCULAR PRODUCTS) IMPLANT
SNARE GOOSENECK 15MM (MISCELLANEOUS) IMPLANT
SUT ETHILON 2 0 PSLX (SUTURE) IMPLANT
SUT MNCRL AB 4-0 PS2 18 (SUTURE) IMPLANT
SUT NYLON 3 0 (SUTURE) IMPLANT
SYR CONTROL 10ML LL (SYRINGE) ×1 IMPLANT
WIRE AMPLATZ SS-J .035X180CM (WIRE) IMPLANT
WIRE BENTSON .035X145CM (WIRE) ×1 IMPLANT
WIRE STARTER BENTSON 035X150 (WIRE) IMPLANT

## 2022-07-17 NOTE — Transfer of Care (Signed)
Immediate Anesthesia Transfer of Care Note  Patient: Alyssa Peterson  Procedure(s) Performed: VENA CAVA FILTER REMOVAL WITH CENTRAL VENOGRAM  Patient Location: PACU  Anesthesia Type:General  Level of Consciousness: awake, alert , and oriented  Airway & Oxygen Therapy: Patient Spontanous Breathing  Post-op Assessment: Post -op Vital signs reviewed and stable  Post vital signs: stable  Last Vitals:  Vitals Value Taken Time  BP 132/90 07/17/22 1255  Temp    Pulse 69 07/17/22 1257  Resp 18 07/17/22 1257  SpO2 100 % 07/17/22 1257  Vitals shown include unvalidated device data.  Last Pain:  Vitals:   07/17/22 1019  TempSrc: Oral  PainSc: 0-No pain         Complications: No notable events documented.

## 2022-07-17 NOTE — Op Note (Signed)
DATE OF SERVICE: 07/17/2022  PATIENT:  Alyssa Peterson  72 y.o. female  PRE-OPERATIVE DIAGNOSIS:  IVC filter in place  POST-OPERATIVE DIAGNOSIS:  Same  PROCEDURE:   1) ultrasound guided right jugular vein access 2) selective catheterization of inferior vena cava and central venogram 3) endovascular retrieval of inferior vena cava  SURGEON:  Surgeon(s) and Role:    * [name], MD - Primary  ASSISTANT: none  ANESTHESIA:   general  EBL: minimal  BLOOD ADMINISTERED:none  DRAINS: none   LOCAL MEDICATIONS USED:  NONE  SPECIMEN:  IVC filter retrieved intact  COUNTS: confirmed correct .  TOURNIQUET:  none  PATIENT DISPOSITION:  PACU - hemodynamically stable.   Delay start of Pharmacological VTE agent (>24hrs) due to surgical blood loss or risk of bleeding: no  INDICATION FOR PROCEDURE: Alyssa Peterson is a 72 y.o. female with IVC filter in place. After careful discussion of risks, benefits, and alternatives the patient was offered IVC filter retrieval. The patient understood and wished to proceed.  OPERATIVE FINDINGS: successful endovascular retrieval of IVC filter. Normal central venogram.  DESCRIPTION OF PROCEDURE: After identification of the patient in the pre-operative holding area, the patient was transferred to the operating room. The patient was positioned supine on the operating room table. Anesthesia was induced. The right neck was prepped and draped in standard fashion. A surgical pause was performed confirming correct patient, procedure, and operative location.  Using ultrasound guidance, the right jugular vein was cannulated with micropuncture technique.  A micro sheath was introduced to the jugular vein with Seldinger technique.  A Glidewire advantage was navigated into the anterior vena cava below the filter using fluoroscopic guidance.  A small incision was made in the neck about the access.  The 12 French dry seal sheath was delivered over the wire past  the heart.  A Cook IVC filter retrieval system was then assembled per the manufacturer's instructions and delivered through the sheath to just above the filter.  The filter was snared and recaptured with the retrieval kit.  The filter was collapsed without difficulty.  This was removed intact.  A central venogram was performed after removal and no disruption of the cava was noted.  All endovascular equipment was removed.  A U-stitch was placed to the access site.  Manual pressure was held for 5 minutes.  Hemostasis was achieved.  A sterile bandage was applied.  Upon completion of the case instrument and sharps counts were confirmed correct. The patient was transferred to the PACU in good condition. I was present for all portions of the procedure.  FOLLOW UP PLAN: Assuming a normal postoperative course, the patient can follow-up with me on an as-needed basis.  Rande Brunt. Lenell Antu, MD [facility] Phone Number: [phone] 07/17/2022 12:47 PM

## 2022-07-17 NOTE — Anesthesia Procedure Notes (Signed)
Procedure Name: Intubation Date/Time: 07/17/2022 12:20 PM  Performed by: Dorie Rank, CRNAPre-anesthesia Checklist: Patient identified, Emergency Drugs available, Suction available, Patient being monitored and Timeout performed Patient Re-evaluated:Patient Re-evaluated prior to induction Oxygen Delivery Method: Circle system utilized Preoxygenation: Pre-oxygenation with 100% oxygen Induction Type: IV induction Ventilation: Mask ventilation without difficulty Laryngoscope Size: Mac and 3 Grade View: Grade I Tube type: Oral Tube size: 7.0 mm Number of attempts: 1 Airway Equipment and Method: Stylet Placement Confirmation: ETT inserted through vocal cords under direct vision, positive ETCO2 and breath sounds checked- equal and bilateral Secured at: 23 cm Tube secured with: Tape Dental Injury: Teeth and Oropharynx as per pre-operative assessment

## 2022-07-17 NOTE — Progress Notes (Signed)
Patient up and dressed awaiting family to transport home skin warm and dry respiration even unlabored tolerating soda

## 2022-07-17 NOTE — Anesthesia Postprocedure Evaluation (Signed)
Anesthesia Post Note  Patient: Alyssa Peterson  Procedure(s) Performed: VENA CAVA FILTER REMOVAL WITH CENTRAL VENOGRAM     Patient location during evaluation: PACU Anesthesia Type: General Level of consciousness: sedated Pain management: pain level controlled Vital Signs Assessment: post-procedure vital signs reviewed and stable Respiratory status: spontaneous breathing and respiratory function stable Cardiovascular status: stable Postop Assessment: no apparent nausea or vomiting Anesthetic complications: no   No notable events documented.  Last Vitals:  Vitals:   07/17/22 1315 07/17/22 1330  BP: 127/83 127/82  Pulse: 67 66  Resp: 16 16  Temp:  36.7 C  SpO2: 100% 100%    Last Pain:  Vitals:   07/17/22 1330  TempSrc:   PainSc: 0-No pain                 Durward Matranga DANIEL

## 2022-07-17 NOTE — Interval H&P Note (Signed)
History and Physical Interval Note:  07/17/2022 11:47 AM  Alyssa Peterson  has presented today for surgery, with the diagnosis of Completion of treatment.  The various methods of treatment have been discussed with the patient and family. After consideration of risks, benefits and other options for treatment, the patient has consented to  Procedure(s): VENA CAVA FILTER REMOVAL (N/A) as a surgical intervention.  The patient's history has been reviewed, patient examined, no change in status, stable for surgery.  I have reviewed the patient's chart and labs.  Questions were answered to the patient's satisfaction.     [name]

## 2022-07-18 ENCOUNTER — Telehealth: Payer: Self-pay

## 2022-07-18 NOTE — Telephone Encounter (Signed)
Pt called stating that she saw some blood on the gauze and was told to call if she saw any. She wanted to make sure she didn't need to worry.  Reviewed pt's chart, returned call for clarification, no answer, lf vm.

## 2022-08-14 ENCOUNTER — Ambulatory Visit: Payer: Medicare Other | Attending: Physician Assistant | Admitting: Physician Assistant

## 2022-08-14 ENCOUNTER — Encounter: Payer: Self-pay | Admitting: Physician Assistant

## 2022-08-14 VITALS — BP 115/74 | HR 81 | Temp 98.1°F | Resp 12 | Ht 60.0 in | Wt 114.8 lb

## 2022-08-14 DIAGNOSIS — R1319 Other dysphagia: Secondary | ICD-10-CM

## 2022-08-14 DIAGNOSIS — K432 Incisional hernia without obstruction or gangrene: Secondary | ICD-10-CM

## 2022-08-14 DIAGNOSIS — K449 Diaphragmatic hernia without obstruction or gangrene: Secondary | ICD-10-CM | POA: Diagnosis not present

## 2022-08-14 DIAGNOSIS — L309 Dermatitis, unspecified: Secondary | ICD-10-CM

## 2022-08-14 MED ORDER — TRIAMCINOLONE ACETONIDE 0.1 % EX CREA
1.0000 | TOPICAL_CREAM | Freq: Two times a day (BID) | CUTANEOUS | 2 refills | Status: AC
Start: 2022-08-14 — End: ?

## 2022-08-14 NOTE — Progress Notes (Signed)
Patient ID: Alyssa Peterson, female   DOB: February 19, 1951, 72 y.o.   MRN: [id number]   Alyssa Peterson, is a 72 y.o. female  [id number]  [id number]  DOB - 04-23-1950  No chief complaint on file.      Subjective:   Alyssa Peterson is a 72 y.o. female here today dysphagia, burping and reflux coming back s/p hiatal hernia repair in January this year.  She is also c/o developing an abdominal hernia near the ventral incision she had about 3 years ago when she had to have emergency surgery.  Not painful.  Also wants to review mammogram results which were normal.  Labs 04/2022.  On chronic AC.  Needs eczema cream RF  No problems updated.  ALLERGIES: Allergies  Allergen Reactions   Chocolate Hives   Latex Itching    hands    PAST MEDICAL HISTORY: Past Medical History:  Diagnosis Date   Anemia    Hx   Arthritis    Diaphragmatic hernia with obstruction    Gastric volvulus    GERD (gastroesophageal reflux disease)    History of hiatal hernia    Hypertension    Inguinal hernia    left   Pulmonary embolism (HCC)     MEDICATIONS AT HOME: Prior to Admission medications   Medication Sig Start Date End Date Taking? Authorizing Provider  triamcinolone cream (KENALOG) 0.1 % Apply 1 Application topically 2 (two) times daily. 08/14/22  Yes Anders Simmonds, PA-C  acetaminophen (TYLENOL) 325 MG tablet Take 2 tablets (650 mg total) by mouth every 6 (six) hours as needed for mild pain (or temp > 100). Patient not taking: Reported on 07/16/2022 04/11/22   Adam Phenix, PA-C  amLODipine (NORVASC) 10 MG tablet Take 1 tablet (10 mg total) by mouth at bedtime. 05/12/22   Ardelle Balls, PA-C  apixaban (ELIQUIS) 5 MG TABS tablet Take 5 mg by mouth 2 (two) times daily.    [provider]  Calcium Carbonate Antacid (TUMS PO) Take 2 tablets by mouth daily as needed (Heartburn).    [provider]  gabapentin (NEURONTIN) 300 MG capsule Take 1 capsule (300 mg total)  by mouth at bedtime. Patient not taking: Reported on 07/16/2022 05/12/22   Ardelle Balls, PA-C  HYDROcodone-acetaminophen (NORCO/VICODIN) 5-325 MG tablet Take 1 tablet by mouth every 4 (four) hours as needed for moderate pain. 07/17/22 07/17/23  [name], MD  Hydrocortisone (CORTIZONE-10 EX) Apply 1 Application topically daily as needed (Itching).    [provider]  LUMIGAN 0.01 % SOLN Place 1 drop into both eyes at bedtime. 01/28/22   [provider]  traMADol (ULTRAM) 50 MG tablet Take 1 tablet (50 mg total) by mouth every 6 (six) hours as needed for moderate pain (mild pain). Patient not taking: Reported on 07/16/2022 05/12/22   Ardelle Balls, PA-C    ROS: Neg HEENT Neg resp Neg cardiac Neg GI Neg GU Neg MS Neg psych Neg neuro  Objective:   Vitals:   08/14/22 0854  BP: 115/74  Pulse: 81  Resp: 12  Temp: 98.1 F (36.7 C)  SpO2: 98%  Weight: 114 lb 12.8 oz (52.1 kg)  Height: 5' (1.524 m)   Exam General appearance : Awake, alert, not in any distress. Speech Clear. Not toxic looking; very thin HEENT: Atraumatic and Normocephalic Neck: Supple, no JVD. No cervical lymphadenopathy.  Chest: Good air entry bilaterally, CTAB.  No rales/rhonchi/wheezing CVS: S1 S2 regular, no murmurs.  Abdomen: Bowel sounds  present, Non tender and not distended with no gaurding, rigidity or rebound. There is a ventral incision above the umbilicus.  There is about a 4 cm hernia that is reducible and not tender.   Extremities: B/L Lower Ext shows no edema, both legs are warm to touch Neurology: Awake alert, and oriented X 3, CN II-XII intact, Non focal Skin: some dry skin  Data Review Lab Results  Component Value Date   HGBA1C 5.9 (H) 01/07/2022   HGBA1C 5.9 (H) 08/23/2020    Assessment & Plan   1. Incisional hernia, without obstruction or gangrene - Ambulatory referral to General Surgery  2. Hiatal hernia s/p repair 03/2022 but having symptoms -  Ambulatory referral to Gastroenterology  3. Other dysphagia See #2 - Ambulatory referral to Gastroenterology  4. Eczema, unspecified type - triamcinolone cream (KENALOG) 0.1 %; Apply 1 Application topically 2 (two) times daily.  Dispense: 45 g; Refill: 2    Return in about 4 months (around 12/15/2022) for PCP for chronic conditions.  The patient was given clear instructions to go to ER or return to medical center if symptoms don't improve, worsen or new problems develop. The patient verbalized understanding. The patient was told to call to get lab results if they haven't heard anything in the next week.      Georgian Co, PA-C [facility] [place], [place] [phone]   08/14/2022, 9:20 AM

## 2022-08-14 NOTE — Progress Notes (Signed)
Pt complaining of issues with a hernia   Complaining of trouble swallowing, excessive burping and heartburn   Pt had surgery for same 03/27/22

## 2022-09-19 DIAGNOSIS — K219 Gastro-esophageal reflux disease without esophagitis: Secondary | ICD-10-CM | POA: Diagnosis not present

## 2022-10-16 ENCOUNTER — Other Ambulatory Visit: Payer: Self-pay | Admitting: Nurse Practitioner

## 2022-10-16 ENCOUNTER — Other Ambulatory Visit: Payer: Self-pay | Admitting: Pharmacist

## 2022-10-16 DIAGNOSIS — I10 Essential (primary) hypertension: Secondary | ICD-10-CM

## 2022-10-16 DIAGNOSIS — K432 Incisional hernia without obstruction or gangrene: Secondary | ICD-10-CM | POA: Diagnosis not present

## 2022-10-16 MED ORDER — AMLODIPINE BESYLATE 10 MG PO TABS
10.0000 mg | ORAL_TABLET | Freq: Every day | ORAL | 0 refills | Status: DC
Start: 2022-10-16 — End: 2023-01-08

## 2022-10-16 NOTE — Telephone Encounter (Signed)
Medication Refill - Medication: amLODipine (NORVASC) 10 MG tablet  Has the patient contacted their pharmacy? Yes.   Denise from the pharmacy states that she has sent in two request for refills for the pt for her medication. Per Angelique Blonder they have already given pt a 5 day supply of medication and pt is out of that. Angelique Blonder states pt is reporting having headaches. Pt is not at the pharmacy.   Preferred Pharmacy (with phone number or street name): [id number] - [place], [place] - [address] AT [facility] OF [address]  Phone: [phone] Fax: [phone]  Has the patient been seen for an appointment in the last year OR does the patient have an upcoming appointment? Yes.    Agent: Please be advised that RX refills may take up to 3 business days. We ask that you follow-up with your pharmacy.

## 2022-11-06 ENCOUNTER — Encounter (HOSPITAL_COMMUNITY): Payer: Self-pay

## 2022-11-06 ENCOUNTER — Ambulatory Visit (HOSPITAL_COMMUNITY)
Admission: EM | Admit: 2022-11-06 | Discharge: 2022-11-06 | Disposition: A | Discharge status: Home / Self Care | Payer: Medicare Other | Attending: Internal Medicine | Admitting: Internal Medicine

## 2022-11-06 ENCOUNTER — Ambulatory Visit (HOSPITAL_COMMUNITY): Payer: Medicare Other

## 2022-11-06 ENCOUNTER — Ambulatory Visit: Payer: Self-pay

## 2022-11-06 DIAGNOSIS — R918 Other nonspecific abnormal finding of lung field: Secondary | ICD-10-CM | POA: Diagnosis not present

## 2022-11-06 DIAGNOSIS — R197 Diarrhea, unspecified: Secondary | ICD-10-CM | POA: Diagnosis not present

## 2022-11-06 DIAGNOSIS — R1030 Lower abdominal pain, unspecified: Secondary | ICD-10-CM | POA: Diagnosis not present

## 2022-11-06 DIAGNOSIS — K219 Gastro-esophageal reflux disease without esophagitis: Secondary | ICD-10-CM

## 2022-11-06 DIAGNOSIS — J984 Other disorders of lung: Secondary | ICD-10-CM | POA: Diagnosis not present

## 2022-11-06 DIAGNOSIS — I771 Stricture of artery: Secondary | ICD-10-CM | POA: Diagnosis not present

## 2022-11-06 DIAGNOSIS — R109 Unspecified abdominal pain: Secondary | ICD-10-CM | POA: Diagnosis not present

## 2022-11-06 LAB — POCT URINALYSIS DIP (MANUAL ENTRY)
Blood, UA: NEGATIVE
Glucose, UA: NEGATIVE mg/dL
Leukocytes, UA: NEGATIVE
Nitrite, UA: NEGATIVE
Protein Ur, POC: NEGATIVE mg/dL
Spec Grav, UA: 1.025 (ref 1.010–1.025)
Urobilinogen, UA: 1 E.U./dL
pH, UA: 6.5 (ref 5.0–8.0)

## 2022-11-06 MED ORDER — ONDANSETRON 4 MG PO TBDP: 4.0000 mg | ORAL_TABLET | Freq: Three times a day (TID) | ORAL | 0 refills | Status: DC | PRN

## 2022-11-06 MED ORDER — PANTOPRAZOLE SODIUM 40 MG PO TBEC: 40.0000 mg | DELAYED_RELEASE_TABLET | Freq: Every day | ORAL | 0 refills | Status: DC

## 2022-11-06 NOTE — Telephone Encounter (Signed)
    Chief Complaint: Abdominal pain below belly button that comes and goes. Better today. Concerned because she does have a hernia. Has some loose stools as well. Symptoms: Above Frequency: Yesterday Pertinent Negatives: Patient denies  Disposition: [] ED /[] Urgent Care (no appt availability in office) / [] Appointment(In office/virtual)/ []  [place] [facility]/ [] Home Care/ [] Refused Recommended Disposition /[x] [place] [facility]/ []  Follow-up with PCP Additional Notes: Agrees to go to mobile unit.  Reason for Disposition  Age > 60 years  Answer Assessment - Initial Assessment Questions 1. LOCATION: "Where does it hurt?"      Under belly button 2. RADIATION: "Does the pain shoot anywhere else?" (e.g., chest, back)     No 3. ONSET: "When did the pain begin?" (e.g., minutes, hours or days ago)      Yesterday 4. SUDDEN: "Gradual or sudden onset?"     Gradual 5. PATTERN "Does the pain come and go, or is it constant?"    - If it comes and goes: "How long does it last?" "Do you have pain now?"     (Note: Comes and goes means the pain is intermittent. It goes away completely between bouts.)    - If constant: "Is it getting better, staying the same, or getting worse?"      (Note: Constant means the pain never goes away completely; most serious pain is constant and gets worse.)      Comes and goes 6. SEVERITY: "How bad is the pain?"  (e.g., Scale 1-10; mild, moderate, or severe)    - MILD (1-3): Doesn't interfere with normal activities, abdomen soft and not tender to touch.     - MODERATE (4-7): Interferes with normal activities or awakens from sleep, abdomen tender to touch.     - SEVERE (8-10): Excruciating pain, doubled over, unable to do any normal activities.       Now - mild 7. RECURRENT SYMPTOM: "Have you ever had this type of stomach pain before?" If Yes, ask: "When was the last time?" and "What happened that time?"      Yes 8. CAUSE: "What do you think is causing the stomach  pain?"     Maybe hernia 9. RELIEVING/AGGRAVATING FACTORS: "What makes it better or worse?" (e.g., antacids, bending or twisting motion, bowel movement)     No 10. OTHER SYMPTOMS: "Do you have any other symptoms?" (e.g., back pain, diarrhea, fever, urination pain, vomiting)       Some loose stools 11. PREGNANCY: "Is there any chance you are pregnant?" "When was your last menstrual period?"       No  Protocols used: Abdominal Pain - [facility]

## 2022-11-06 NOTE — ED Provider Notes (Signed)
[facility]    CSN: [id number] Arrival date & time: 11/06/22  1047      History   Chief Complaint Chief Complaint  Patient presents with   Abdominal Pain    [name] is a 72 y.o. female.   Patient presents with abdominal pain ongoing 1 day.  Reports the pain is currently 3-4/10, yesteday was 9/10 and has lower abdominal cramping that is intermittent.  The patient denies known fevers, over did have a "chill" yesterday.  No nausea or vomiting.  Reports 1 episode of loose bowel movement today, 4 episodes yesterday.  Denies blood in the stool.  No new rash or urinary symptoms.  No recent antibiotic use or foreign travel, no ingestion of suspicious drinking water or suspicious food.  No loss of taste or smell, cough, congestion, or sore throat.  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  Was able to eat some food yesterday without vomiting.  Appetite has been decreased today.  Reports she has heartburn "all the time."  She was given a prescription for Protonix that she lost and has not been taking.  Reports he occasionally "burps up food."  Reports she had hernia surgery approximately 7 months ago and is having repeat hernia surgery in a few months to fix the hernia that has recurred in her stomach.     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   Pulmonary embolus (HCC) 04/29/2022   Intra-abdominal abscess (HCC) 04/08/2022   Protein-calorie malnutrition, severe 04/03/2022   Hiatal hernia 03/24/2022   Essential hypertension 11/21/2020   Perforated abdominal viscus 07/04/2020   Family history of colonic polyps 04/04/2016    Past Surgical History:   Procedure Laterality Date   COLONOSCOPY     CYST REMOVAL NECK     DENTAL SURGERY     FEMORAL HERNIA REPAIR Left 07/17/2016   Procedure: LEFT FEMORAL  HERNIA REPAIR WITH MESH;  Surgeon: Manus Rudd, MD;  Location: [facility];  Service: General;  Laterality: Left;   GASTROJEJUNOSTOMY N/A 07/04/2020   Procedure: GASTROSTOMY TUBE;  Surgeon: Berna Bue, MD;  Location: [facility] OR;  Service: General;  Laterality: N/A;   GASTROSTOMY N/A 03/27/2022   Procedure: INSERTION OF GASTROSTOMY TUBE;  Surgeon: Berna Bue, MD;  Location: [facility];  Service: General;  Laterality: N/A;   HIATAL HERNIA REPAIR N/A 03/27/2022   Procedure: LAPAROSCOPIC REPAIR OF HIATAL HERNIA;  Surgeon: Berna Bue, MD;  Location: [facility];  Service: General;  Laterality: N/A;   IVC FILTER INSERTION N/A 05/02/2022   Procedure: IVC FILTER INSERTION;  Surgeon: [name], MD;  [facility];  Service: Cardiovascular;  Laterality: N/A;   IVC VENOGRAPHY N/A 05/02/2022   Procedure: IVC Venography;  Surgeon: [name], MD;  [facility];  Service: Cardiovascular;  Laterality: N/A;   LAPAROSCOPIC LYSIS OF ADHESIONS N/A 03/27/2022   Procedure: LAPAROSCOPIC LYSIS OF ADHESIONS;  Surgeon: Berna Bue, MD;  Location: [facility];  Service: General;  Laterality: N/A;   LAPAROTOMY N/A 07/04/2020  Procedure: EXPLORATORY LAPAROTOMY;  Surgeon: Berna Bue, MD;  Location: [facility] OR;  Service: General;  Laterality: N/A;   LASIK  2023   PLEURAL EFFUSION DRAINAGE Right 05/07/2022   Procedure: DRAINAGE OF PLEURAL EFFUSION;  Surgeon: Loreli Slot, MD;  Location: [facility];  Service: Thoracic;  Laterality: Right;   VIDEO ASSISTED THORACOSCOPY (VATS)/DECORTICATION Right 05/07/2022   Procedure: VIDEO ASSISTED THORACOSCOPY (VATS)/DECORTICATION;  Surgeon: Loreli Slot, MD;  Location: [facility] OR;  Service: Thoracic;  Laterality: Right;    OB History   No obstetric history on  file.      Home Medications    Prior to Admission medications   Medication Sig Start Date End Date Taking? Authorizing Provider  ondansetron (ZOFRAN-ODT) 4 MG disintegrating tablet Take 1 tablet (4 mg total) by mouth every 8 (eight) hours as needed for nausea or vomiting. 11/06/22  Yes Cathlean Marseilles A, NP  pantoprazole (PROTONIX) 40 MG tablet Take 1 tablet (40 mg total) by mouth daily. 11/06/22  Yes Valentino Nose, NP  acetaminophen (TYLENOL) 325 MG tablet Take 2 tablets (650 mg total) by mouth every 6 (six) hours as needed for mild pain (or temp > 100). Patient not taking: Reported on 07/16/2022 04/11/22   Adam Phenix, PA-C  amLODipine (NORVASC) 10 MG tablet Take 1 tablet (10 mg total) by mouth at bedtime. 10/16/22   Hoy Register, MD  apixaban (ELIQUIS) 5 MG TABS tablet Take 5 mg by mouth 2 (two) times daily.    [provider]  Calcium Carbonate Antacid (TUMS PO) Take 2 tablets by mouth daily as needed (Heartburn).    [provider]  gabapentin (NEURONTIN) 300 MG capsule Take 1 capsule (300 mg total) by mouth at bedtime. Patient not taking: Reported on 07/16/2022 05/12/22   Ardelle Balls, PA-C  HYDROcodone-acetaminophen (NORCO/VICODIN) 5-325 MG tablet Take 1 tablet by mouth every 4 (four) hours as needed for moderate pain. 07/17/22 07/17/23  [name], MD  Hydrocortisone (CORTIZONE-10 EX) Apply 1 Application topically daily as needed (Itching).    [provider]  LUMIGAN 0.01 % SOLN Place 1 drop into both eyes at bedtime. 01/28/22   [provider]  traMADol (ULTRAM) 50 MG tablet Take 1 tablet (50 mg total) by mouth every 6 (six) hours as needed for moderate pain (mild pain). Patient not taking: Reported on 07/16/2022 05/12/22   Ardelle Balls, PA-C  triamcinolone cream (KENALOG) 0.1 % Apply 1 Application topically 2 (two) times daily. 08/14/22   Anders Simmonds, PA-C    Family History History reviewed. No pertinent  family history.  Social History Social History   Tobacco Use   Smoking status: Never   Smokeless tobacco: Never  Vaping Use   Vaping status: Never Used  Substance Use Topics   Alcohol use: No    Alcohol/week: 0.0 standard drinks of alcohol   Drug use: No     Allergies   Chocolate and Latex   Review of Systems Review of Systems Per HPI  Physical Exam Triage Vital Signs ED Triage Vitals  Encounter Vitals Group     BP 11/06/22 1119 127/75     Systolic BP Percentile --      Diastolic BP Percentile --      Pulse Rate 11/06/22 1119 80     Resp 11/06/22 1119 16     Temp 11/06/22 1119 100.1 F (37.8 C)     Temp Source 11/06/22 1119 Oral     SpO2 11/06/22 1119  97 %     Weight --      Height --      Head Circumference --      Peak Flow --      Pain Score 11/06/22 1120 7     Pain Loc --      Pain Education --      Exclude from Growth Chart --    No data found.  Updated Vital Signs BP 127/75 (BP Location: Left Arm)   Pulse 80   Temp 100.1 F (37.8 C) (Oral)   Resp 16   SpO2 97%   Visual Acuity Right Eye Distance:   Left Eye Distance:   Bilateral Distance:    Right Eye Near:   Left Eye Near:    Bilateral Near:     Physical Exam Vitals and nursing note reviewed.  Constitutional:      General: She is not in acute distress.    Appearance: Normal appearance. She is not toxic-appearing.  HENT:     Head: Normocephalic and atraumatic.     Mouth/Throat:     Mouth: Mucous membranes are moist.     Pharynx: Oropharynx is clear.  Eyes:     General: No scleral icterus.    Extraocular Movements: Extraocular movements intact.  Cardiovascular:     Rate and Rhythm: Normal rate and regular rhythm.  Pulmonary:     Effort: Pulmonary effort is normal. No respiratory distress.     Breath sounds: Normal breath sounds. No wheezing, rhonchi or rales.  Abdominal:     General: Abdomen is flat. Bowel sounds are decreased. There is no distension.     Palpations: Abdomen is  soft.     Tenderness: There is abdominal tenderness in the left lower quadrant. There is no guarding or rebound. Negative signs include Murphy's sign and McBurney's sign.  Musculoskeletal:     Cervical back: Normal range of motion.  Lymphadenopathy:     Cervical: No cervical adenopathy.  Skin:    General: Skin is warm and dry.     Capillary Refill: Capillary refill takes less than 2 seconds.     Coloration: Skin is not jaundiced or pale.     Findings: No erythema.  Neurological:     Mental Status: She is alert and oriented to person, place, and time.  Psychiatric:        Behavior: Behavior is cooperative.      UC Treatments / Results  Labs (all labs ordered are listed, but only abnormal results are displayed) Labs Reviewed  POCT URINALYSIS DIP (MANUAL ENTRY) - Abnormal; Notable for the following components:      Result Value   Bilirubin, UA small (*)    Ketones, POC UA small (15) (*)    All other components within normal limits    EKG   Radiology DG Abd Acute W/Chest  Result Date: 11/06/2022 CLINICAL DATA:  Abdominal pain EXAM: DG ABDOMEN ACUTE WITH 1 VIEW CHEST COMPARISON:  Dated July 15, 2022 FINDINGS: Nonobstructive bowel-gas pattern. Moderate stool burden. Suture line of the left upper quadrant, likely related to prior gastric surgery. Calcifications in the pelvis, likely due to phleboliths acute osseous abnormalities. Normal heart size. Tortuous thoracic aorta, unchanged when compared with the prior. Linear opacity of the mid right lung, unchanged when compared with the prior and likely due to scarring or atelectasis. Lungs are otherwise clear. Unchanged mild blunting of the right costophrenic angle, could be due to pleural thickening or trace effusion. No evidence of  pneumothorax. IMPRESSION: 1. Nonobstructive bowel-gas pattern. Moderate stool burden. 2. Linear opacity of the mid right lung, unchanged when compared with the prior and likely due to scarring or atelectasis.  Lungs are otherwise clear. 3. Unchanged mild blunting of the right costophrenic angle, could be due to pleural thickening or trace effusion. Electronically Signed   By: Allegra Lai M.D.   On: 11/06/2022 15:43    Procedures Procedures (including critical care time)  Medications Ordered in UC Medications - No data to display  Initial Impression / Assessment and Plan / UC Course  I have reviewed the triage vital signs and the nursing notes.  Pertinent labs & imaging results that were available during my care of the patient were reviewed by me and considered in my medical decision making (see chart for details).   Patient is well-appearing, normotensive, not tachycardic, not tachypneic, oxygenating well on room air.   Patient has a low grade fever in triage.  1. Lower abdominal pain 2. 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 Gastroesophageal reflux disease without esophagitis Resume Protonix, take on empty stomach Recommended close follow up with General Surgery with no improvement in symptoms despite treatment   The patient was given the opportunity to ask questions.  All questions answered to their satisfaction.  The patient is in agreement to this plan.    Final  Clinical Impressions(s) / UC Diagnoses   Final diagnoses:  Lower abdominal pain  Diarrhea, unspecified type  Gastroesophageal reflux disease without esophagitis     Discharge Instructions      As we discussed, I suspect you have a viral stomach bug which caused the diarrhea.  We got xrays of your abdomen today and they were not officially read before we discharged you.  I will call you later today if the results are abnormal.    Please increase hydration at home with plenty of fluids and sugar free electrolyte drinks.  Take the Zofran as needed for nausea/vomiting.  Seek care in the ER if you develop blood in your stool or severe abdominal pain and nausea/vomiting and unable to keep fluids down.     ED Prescriptions     Medication Sig Dispense Auth. Provider   pantoprazole (PROTONIX) 40 MG tablet Take 1 tablet (40 mg total) by mouth daily. 90 tablet Cathlean Marseilles A, NP   ondansetron (ZOFRAN-ODT) 4 MG disintegrating tablet Take 1 tablet (4 mg total) by mouth every 8 (eight) hours as needed for nausea or vomiting. 20 tablet Valentino Nose, NP      PDMP not reviewed this encounter.   Valentino Nose, NP 11/06/22 [phone]

## 2022-11-06 NOTE — ED Triage Notes (Signed)
Pt states lower abd pain with diarrhea since yesterday.

## 2022-11-06 NOTE — Discharge Instructions (Addendum)
As we discussed, I suspect you have a viral stomach bug which caused the diarrhea.  We got xrays of your abdomen today and they were not officially read before we discharged you.  I will call you later today if the results are abnormal.    Please increase hydration at home with plenty of fluids and sugar free electrolyte drinks.  Take the Zofran as needed for nausea/vomiting.  Seek care in the ER if you develop blood in your stool or severe abdominal pain and nausea/vomiting and unable to keep fluids down.

## 2022-11-08 IMAGING — RF DG UGI W SINGLE CM
11 of 19 series · 13 of 24 positions shown · IV contrast (omnipaque)
Comparison: Abdominopelvic CT 07/04/2020

CLINICAL DATA: Post-op repair of gastric volvulus with perforation
07/04/2020. Patient has a gastrostomy tube and JP drain.

EXAM:
WATER SOLUBLE UPPER GI SERIES
TECHNIQUE: Single-column upper GI series was performed using water soluble
contrast.
CONTRAST:  150mL OMNIPAQUE IOHEXOL 300 MG/ML  SOLN

[Series 1: t abdomen supine · 0.15mm/px · 1 of 1 slices shown]
[im 1/1]
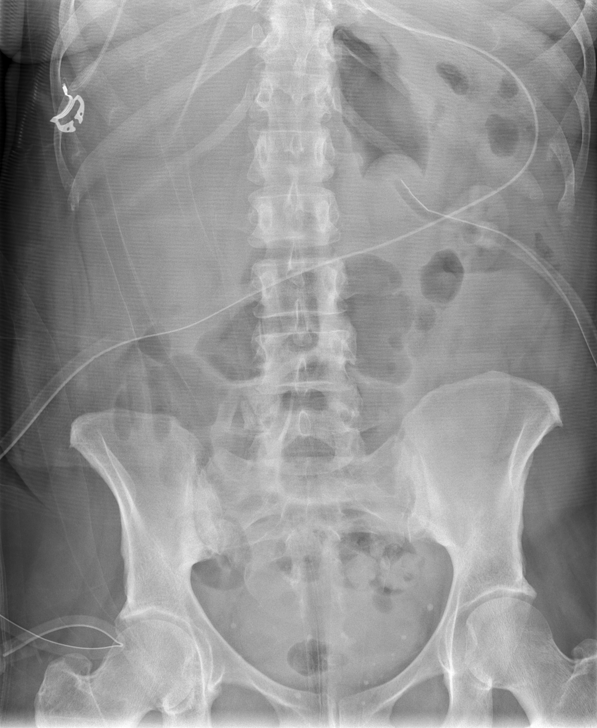

[Series 3: cp_standard · 0.54mm/px · 1 of 39 frames shown (1 of 10)]
[frame 20/39]
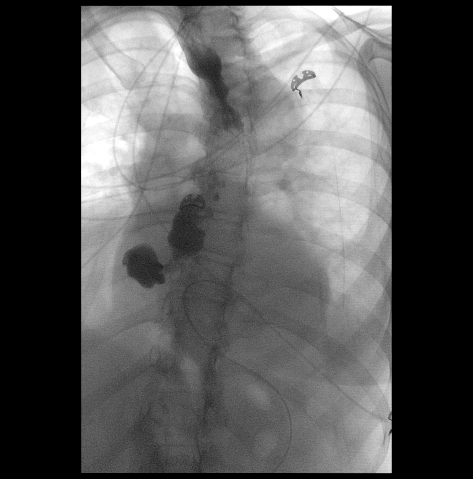

[Series 4: cp_standard · 0.27mm/px · 1 of 1 slices shown (2 of 10)]
[im 1/1]
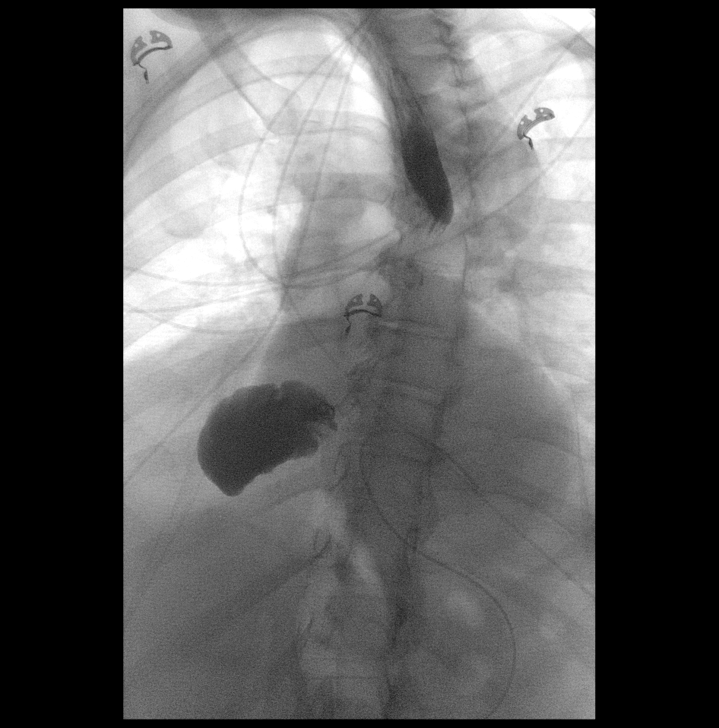

[Series 7: cp_standard · 0.55mm/px · 2 of 49 frames shown (3 of 10)]
[frame 8/49]
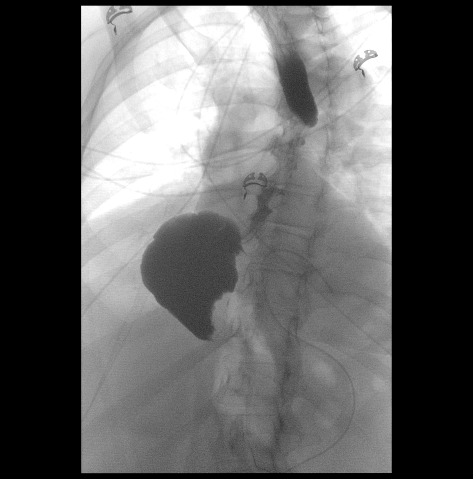
[frame 42/49]
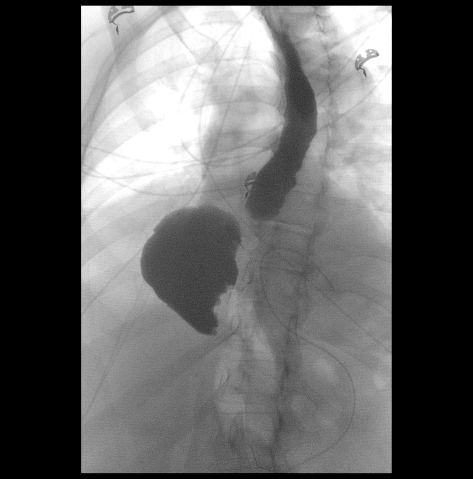

[Series 9: cp_standard · 0.28mm/px · 1 of 1 slices shown (4 of 10)]
[im 1/1]
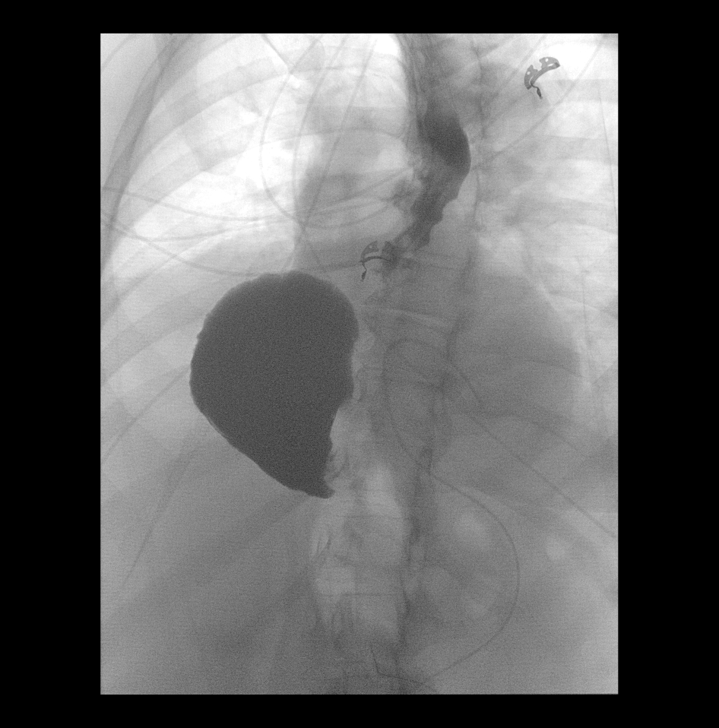

[Series 12: cp_standard · 0.30mm/px · 1 of 1 slices shown (5 of 10)]
[im 1/1]
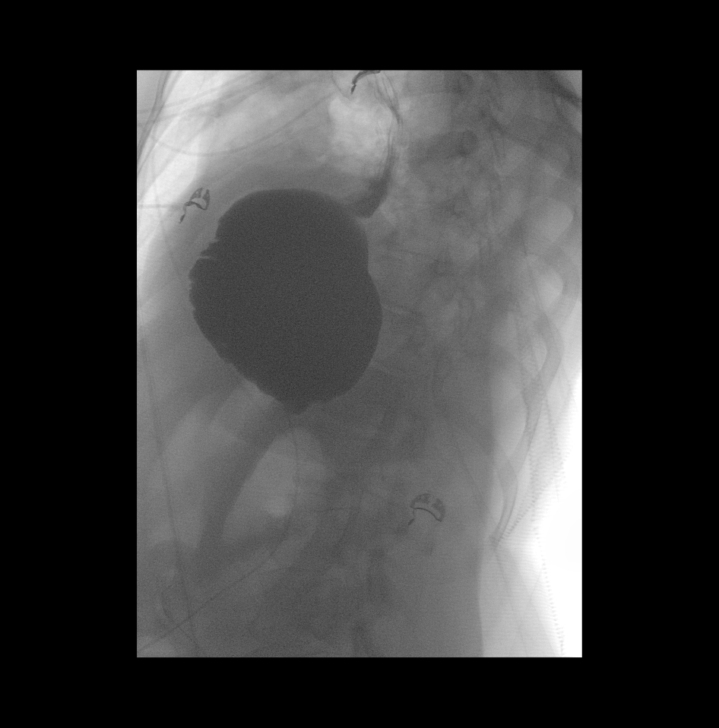

[Series 13: cp_standard · 0.60mm/px · 2 of 34 frames shown (6 of 10)]
[frame 18/34]
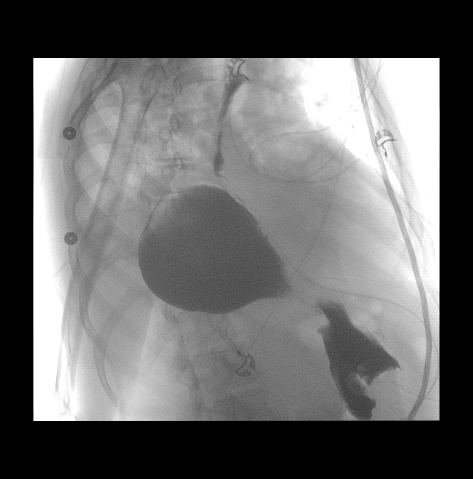
[frame 34/34]
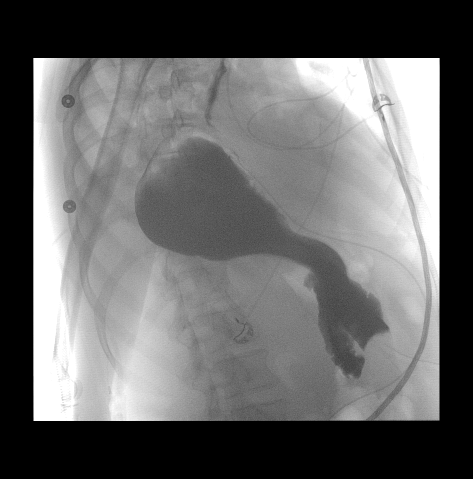

[Series 16: cp_standard · 0.30mm/px · 1 of 1 slices shown (7 of 10)]
[im 1/1]
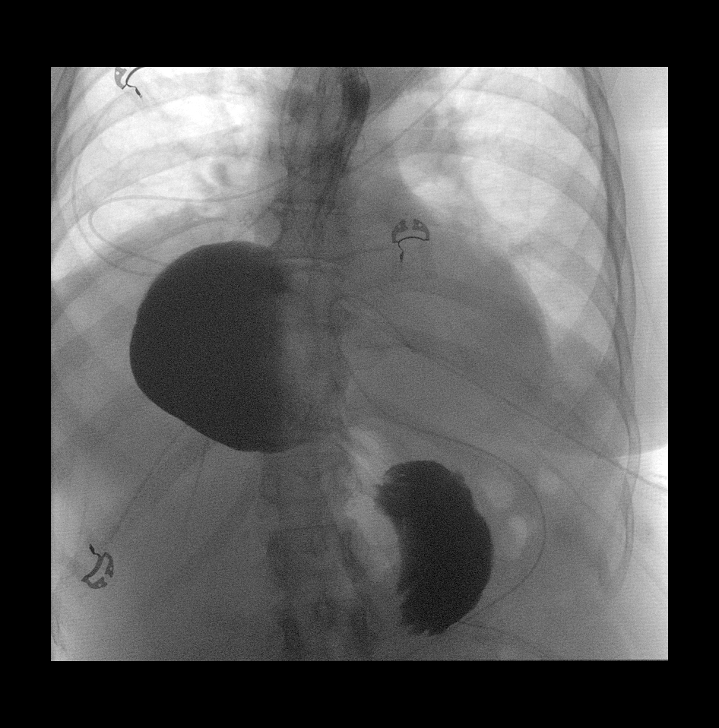

[Series 19: cp_standard · 0.27mm/px · 1 of 1 slices shown (8 of 10)]
[im 1/1]
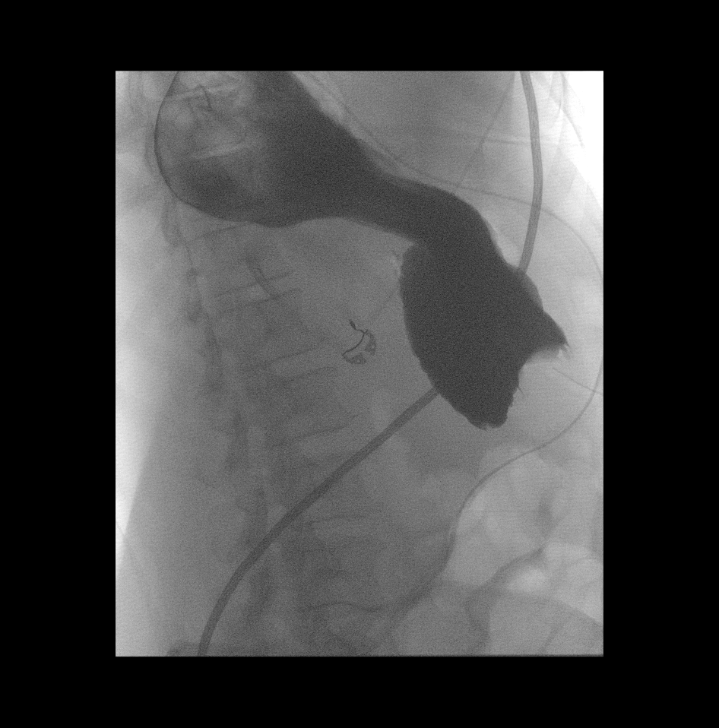

[Series 21: cp_standard · 0.27mm/px · 1 of 1 slices shown (9 of 10)]
[im 1/1]
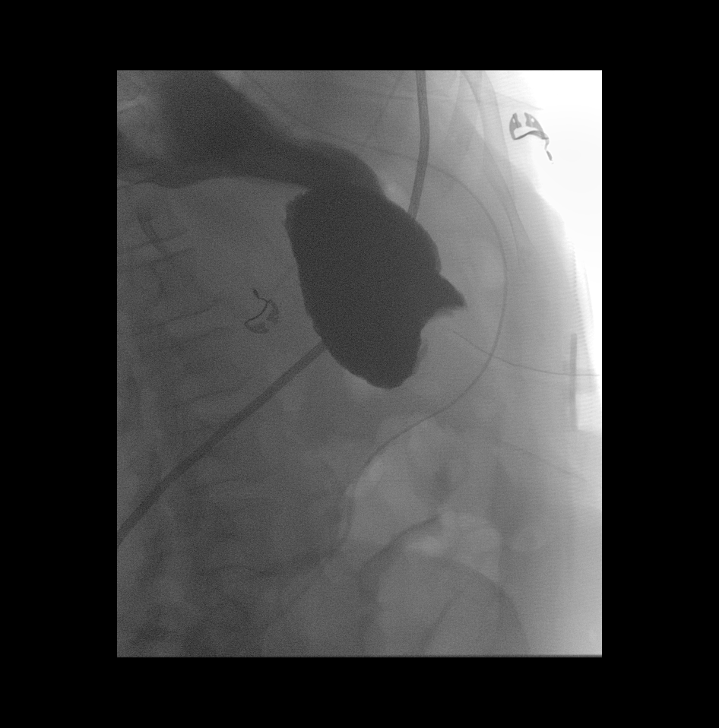

[Series 24: cp_standard · 0.30mm/px · 1 of 1 slices shown (10 of 10)]
[im 1/1]
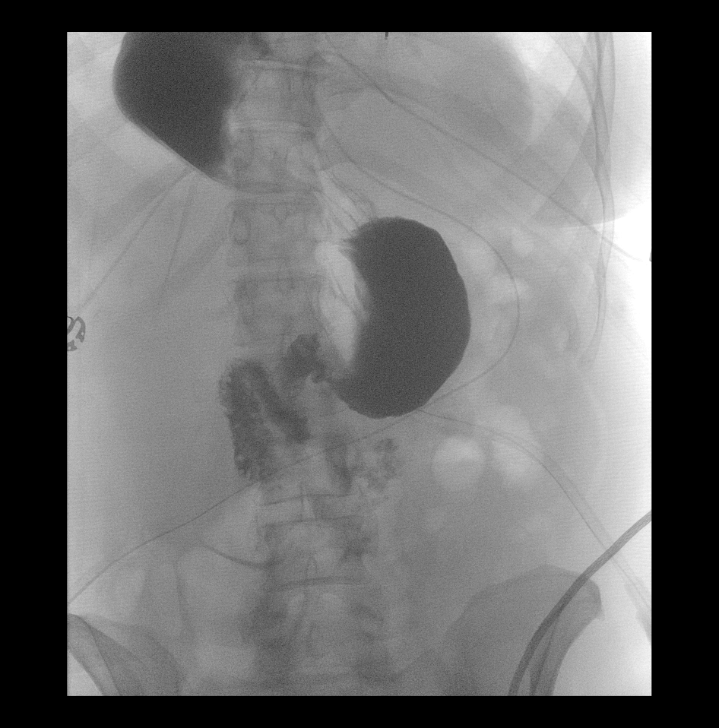

[13 of 24 positions shown; findings below may reference images not displayed]

FLUOROSCOPY TIME:  Fluoroscopy Time: 1 minutes and 54 seconds of
low-dose pulsed fluoroscopy

Radiation Exposure Index (if provided by the fluoroscopic device):
21.6 mGy

Number of Acquired Spot Images: 1 scout image.  Two spot images.
FINDINGS: The scout abdominal radiograph demonstrates a surgical drain in the
upper abdomen and lower mediastinum as well as a percutaneous
gastrostomy tube. Bowel gas pattern appears normal.

The study was performed in the supine, semi erect and semi decubitus
views. The patient swallowed the water-soluble contrast without
difficulty. There is moderate esophageal dysmotility with a
decreased primary stripping wave and diffusely increased tertiary
contractions.

There is a residual hiatal hernia with approximately 50% of the
stomach remaining in the lower right hemithorax. There is no
residual paraesophageal hernia or volvulus. In the supine and right
lateral decubitus positions, there was no drainage of contrast from
the hiatal hernia into the intra-abdominal portion of the stomach.
However, when the patient was turned in the left lateral cubitus
position, contrast enters the distal stomach. Contrast a ventrally
drained into the proximal small bowel. No evidence of contrast leak
or obstruction.
IMPRESSION: 1. Interval repair of incarcerated paraesophageal hernia and gastric
perforation. No evidence of leak.
2. Residual hiatal hernia with approximately 50% of the stomach in
the lower right hemithorax. Positional emptying of contrast from the
hiatal hernia into the intraabdominal portion of the stomach. No
gastric outlet obstruction.

## 2022-12-15 ENCOUNTER — Ambulatory Visit: Payer: Medicare Other | Admitting: Nurse Practitioner

## 2023-01-08 ENCOUNTER — Telehealth: Payer: Self-pay

## 2023-01-08 ENCOUNTER — Other Ambulatory Visit: Payer: Self-pay | Admitting: Family Medicine

## 2023-01-08 DIAGNOSIS — I10 Essential (primary) hypertension: Secondary | ICD-10-CM

## 2023-01-08 NOTE — Telephone Encounter (Signed)
Copied from CRM [id number]. Topic: General - Other >> Jan 08, 2023  8:26 AM Franchot Heidelberg wrote: Reason for CRM: Pt called to report that she is leaving town tomorrow morning and that she has only two pills left of amlodipine.

## 2023-01-08 NOTE — Telephone Encounter (Signed)
Refill request sent

## 2023-01-24 ENCOUNTER — Encounter (HOSPITAL_COMMUNITY): Payer: Self-pay

## 2023-01-24 ENCOUNTER — Ambulatory Visit (HOSPITAL_COMMUNITY)
Admission: EM | Admit: 2023-01-24 | Discharge: 2023-01-24 | Disposition: A | Discharge status: Home / Self Care | Payer: Medicare Other | Attending: Nurse Practitioner | Admitting: Nurse Practitioner

## 2023-01-24 DIAGNOSIS — Z760 Encounter for issue of repeat prescription: Secondary | ICD-10-CM

## 2023-01-24 DIAGNOSIS — H409 Unspecified glaucoma: Secondary | ICD-10-CM

## 2023-01-24 HISTORY — DX: Unspecified glaucoma: H40.9

## 2023-01-24 MED ORDER — LUMIGAN 0.01 % OP SOLN: 1.0000 [drp] | Freq: Every day | OPHTHALMIC | 0 refills | Status: AC

## 2023-01-24 NOTE — Discharge Instructions (Signed)
I sent a refill of your glaucoma medicine to the pharmacy.  Please follow-up with an eye doctor soon as possible to make sure you do not run out of the medicine again.

## 2023-01-24 NOTE — ED Provider Notes (Signed)
[facility]    CSN: [id number] Arrival date & time: 01/24/23  1539      History   Chief Complaint Chief Complaint  Patient presents with   Medication Refill    [name] is a 72 y.o. female.   Patient presents today for medication refill.  She reports history of glaucoma and ran out of her eyedrops last night, so is requesting a refill.  She denies vision changes, headache, or floaters in the vision.  Reports her vision is not changed from yesterday before she used eyedrops.  She states that she saw her ophthalmologist approximately 1 year ago and is due to see them again soon.  She is thinking about asking her primary care provider for a referral to a new ophthalmologist as her current one is a far drive for her.    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   Pulmonary embolus (HCC) 04/29/2022   Intra-abdominal abscess (HCC) 04/08/2022   Protein-calorie malnutrition, severe 04/03/2022   Hiatal hernia 03/24/2022   Essential hypertension 11/21/2020   Perforated abdominal viscus 07/04/2020   Family history of colonic polyps 04/04/2016    Past Surgical History:  Procedure Laterality Date   COLONOSCOPY     CYST REMOVAL NECK     DENTAL SURGERY     FEMORAL HERNIA REPAIR Left 07/17/2016   Procedure: LEFT FEMORAL  HERNIA REPAIR WITH MESH;  Surgeon: Manus Rudd, MD;  Location: [facility];  Service: General;  Laterality: Left;   GASTROJEJUNOSTOMY N/A 07/04/2020   Procedure: GASTROSTOMY TUBE;  Surgeon: Berna Bue, MD;  Location: [facility] OR;  Service: General;  Laterality: N/A;   GASTROSTOMY N/A  03/27/2022   Procedure: INSERTION OF GASTROSTOMY TUBE;  Surgeon: Berna Bue, MD;  [facility];  Service: General;  Laterality: N/A;   HIATAL HERNIA REPAIR N/A 03/27/2022   Procedure: LAPAROSCOPIC REPAIR OF HIATAL HERNIA;  Surgeon: Berna Bue, MD;  [facility];  Service: General;  Laterality: N/A;   IVC FILTER INSERTION N/A 05/02/2022   Procedure: IVC FILTER INSERTION;  Surgeon: [name], MD;  [facility];  Service: Cardiovascular;  Laterality: N/A;   IVC VENOGRAPHY N/A 05/02/2022   Procedure: IVC Venography;  Surgeon: [name], MD;  [facility];  Service: Cardiovascular;  Laterality: N/A;   LAPAROSCOPIC LYSIS OF ADHESIONS N/A 03/27/2022   Procedure: LAPAROSCOPIC LYSIS OF ADHESIONS;  Surgeon: Berna Bue, MD;  [facility];  Service: General;  Laterality: N/A;   LAPAROTOMY N/A 07/04/2020   Procedure: EXPLORATORY LAPAROTOMY;  Surgeon: Berna Bue, MD;  [facility];  Service: General;  Laterality: N/A;   LASIK  2023   PLEURAL EFFUSION DRAINAGE Right 05/07/2022   Procedure: DRAINAGE OF PLEURAL EFFUSION;  Surgeon: Loreli Slot, MD;  [facility];  Service: Thoracic;  Laterality: Right;   VIDEO ASSISTED THORACOSCOPY (VATS)/DECORTICATION Right 05/07/2022   Procedure: VIDEO ASSISTED THORACOSCOPY (VATS)/DECORTICATION;  Surgeon: Loreli Slot, MD;  Location: [facility] OR;  Service:  Thoracic;  Laterality: Right;    OB History   No obstetric history on file.      Home Medications    Prior to Admission medications   Medication Sig Start Date End Date Taking? Authorizing Provider  acetaminophen (TYLENOL) 325 MG tablet Take 2 tablets (650 mg total) by mouth every 6 (six) hours as needed for mild pain (or temp > 100). Patient not taking: Reported on 07/16/2022 04/11/22   Adam Phenix, PA-C  amLODipine (NORVASC) 10 MG tablet TAKE 1 TABLET(10 MG) BY MOUTH AT BEDTIME 01/08/23   Claiborne Rigg, NP   apixaban (ELIQUIS) 5 MG TABS tablet Take 5 mg by mouth 2 (two) times daily.    [provider]  Calcium Carbonate Antacid (TUMS PO) Take 2 tablets by mouth daily as needed (Heartburn).    [provider]  gabapentin (NEURONTIN) 300 MG capsule Take 1 capsule (300 mg total) by mouth at bedtime. Patient not taking: Reported on 07/16/2022 05/12/22   Ardelle Balls, PA-C  HYDROcodone-acetaminophen (NORCO/VICODIN) 5-325 MG tablet Take 1 tablet by mouth every 4 (four) hours as needed for moderate pain. 07/17/22 07/17/23  [name], MD  Hydrocortisone (CORTIZONE-10 EX) Apply 1 Application topically daily as needed (Itching).    [provider]  LUMIGAN 0.01 % SOLN Place 1 drop into both eyes at bedtime. 01/24/23   Valentino Nose, NP  ondansetron (ZOFRAN-ODT) 4 MG disintegrating tablet Take 1 tablet (4 mg total) by mouth every 8 (eight) hours as needed for nausea or vomiting. 11/06/22   Valentino Nose, NP  pantoprazole (PROTONIX) 40 MG tablet Take 1 tablet (40 mg total) by mouth daily. 11/06/22   Valentino Nose, NP  traMADol (ULTRAM) 50 MG tablet Take 1 tablet (50 mg total) by mouth every 6 (six) hours as needed for moderate pain (mild pain). Patient not taking: Reported on 07/16/2022 05/12/22   Ardelle Balls, PA-C  triamcinolone cream (KENALOG) 0.1 % Apply 1 Application topically 2 (two) times daily. 08/14/22   Anders Simmonds, PA-C    Family History History reviewed. No pertinent family history.  Social History Social History   Tobacco Use   Smoking status: Never   Smokeless tobacco: Never  Vaping Use   Vaping status: Never Used  Substance Use Topics   Alcohol use: No    Alcohol/week: 0.0 standard drinks of alcohol   Drug use: No     Allergies   Chocolate and Latex   Review of Systems Review of Systems Per HPI  Physical Exam Triage Vital Signs ED Triage Vitals [01/24/23 1630]  Encounter Vitals Group     BP (!) 147/84      Systolic BP Percentile      Diastolic BP Percentile      Pulse Rate 61     Resp 14     Temp 98.2 F (36.8 C)     Temp Source Oral     SpO2 96 %     Weight      Height      Head Circumference      Peak Flow      Pain Score 0     Pain Loc      Pain Education      Exclude from Growth Chart    No data found.  Updated Vital Signs BP (!) 147/84 (BP Location: Left Arm)   Pulse 61   Temp 98.2 F (36.8 C) (Oral)   Resp 14  SpO2 96%   Visual Acuity Right Eye Distance:   Left Eye Distance:   Bilateral Distance:    Right Eye Near:   Left Eye Near:    Bilateral Near:     Physical Exam Vitals and nursing note reviewed.  Constitutional:      General: She is not in acute distress.    Appearance: Normal appearance. She is not toxic-appearing.  HENT:     Head: Normocephalic and atraumatic.     Right Ear: External ear normal.     Left Ear: External ear normal.     Mouth/Throat:     Mouth: Mucous membranes are moist.     Pharynx: Oropharynx is clear.  Eyes:     General:        Right eye: No discharge.        Left eye: No discharge.     Extraocular Movements: Extraocular movements intact.     Pupils: Pupils are equal, round, and reactive to light.  Pulmonary:     Effort: Pulmonary effort is normal. No respiratory distress.  Skin:    General: Skin is warm and dry.     Coloration: Skin is not jaundiced or pale.     Findings: No erythema.  Neurological:     Mental Status: She is alert and oriented to person, place, and time.  Psychiatric:        Behavior: Behavior is cooperative.      UC Treatments / Results  Labs (all labs ordered are listed, but only abnormal results are displayed) Labs Reviewed - No data to display  EKG   Radiology No results found.  Procedures Procedures (including critical care time)  Medications Ordered in UC Medications - No data to display  Initial Impression / Assessment and Plan / UC Course  I have reviewed the triage vital  signs and the nursing notes.  Pertinent labs & imaging results that were available during my care of the patient were reviewed by me and considered in my medical decision making (see chart for details).   Patient is well-appearing, normotensive, afebrile, not tachycardic, not tachypneic, oxygenating well on room air.    1. Encounter for medication refill 2. Glaucoma of both eyes, unspecified glaucoma type Lumigan refilled as previously prescribed Recommended close follow-up with ophthalmology, can contact PCP for a referral if she desires  The patient was given the opportunity to ask questions.  All questions answered to their satisfaction.  The patient is in agreement to this plan.    Final Clinical Impressions(s) / UC Diagnoses   Final diagnoses:  Encounter for medication refill  Glaucoma of both eyes, unspecified glaucoma type     Discharge Instructions      I sent a refill of your glaucoma medicine to the pharmacy.  Please follow-up with an eye doctor soon as possible to make sure you do not run out of the medicine again.   ED Prescriptions     Medication Sig Dispense Auth. Provider   LUMIGAN 0.01 % SOLN Place 1 drop into both eyes at bedtime. 2.5 mL Valentino Nose, NP      PDMP not reviewed this encounter.   Valentino Nose, NP 01/24/23 1700

## 2023-01-24 NOTE — ED Triage Notes (Signed)
Patient states she has glaucoma and ran out of Lumigan 0.01 % and ran out last night. Patient is requesting a refill.

## 2023-02-11 ENCOUNTER — Ambulatory Visit: Payer: Medicare Other | Attending: Nurse Practitioner | Admitting: Nurse Practitioner

## 2023-02-11 ENCOUNTER — Encounter: Payer: Self-pay | Admitting: Nurse Practitioner

## 2023-02-11 VITALS — BP 128/73 | HR 72 | Ht 60.0 in | Wt 114.4 lb

## 2023-02-11 DIAGNOSIS — Z1231 Encounter for screening mammogram for malignant neoplasm of breast: Secondary | ICD-10-CM

## 2023-02-11 DIAGNOSIS — R634 Abnormal weight loss: Secondary | ICD-10-CM | POA: Diagnosis not present

## 2023-02-11 DIAGNOSIS — R7989 Other specified abnormal findings of blood chemistry: Secondary | ICD-10-CM | POA: Diagnosis not present

## 2023-02-11 DIAGNOSIS — D72819 Decreased white blood cell count, unspecified: Secondary | ICD-10-CM

## 2023-02-11 DIAGNOSIS — H538 Other visual disturbances: Secondary | ICD-10-CM | POA: Diagnosis not present

## 2023-02-11 DIAGNOSIS — I10 Essential (primary) hypertension: Secondary | ICD-10-CM

## 2023-02-11 NOTE — Patient Instructions (Addendum)
Stechschulte, Gray Bernhardt, MD  [phone] [address]  [address]  [place], [place] [address]  [phone] (Work)  [phone] (Fax)  Incisional hernia, without obstruction or gangrene   EYE DOCTOR Sent Referral to [facility]) [address] [place], [place] [address] Ph [phone] Fax: [phone]

## 2023-02-11 NOTE — Progress Notes (Signed)
Assessment & Plan:  Alyssa Peterson was seen today for hypertension.  Diagnoses and all orders for this visit:  Primary hypertension Continue all antihypertensives as prescribed.  Reminded to bring in blood pressure log for follow  up appointment.  RECOMMENDATIONS: DASH/Mediterranean Diets are healthier choices for HTN.    Blurred vision, bilateral -     Ambulatory referral to Ophthalmology  Breast cancer screening by mammogram -     MM 3D SCREENING MAMMOGRAM BILATERAL BREAST; Future  Leukopenia, unspecified type -     CBC with Differential  Low serum calcium -     CMP14+EGFR -     VITAMIN D 25 Hydroxy (Vit-D Deficiency, Fractures)  Unintentional weight loss -     Thyroid Panel With TSH; Future    Patient has been counseled on age-appropriate routine health concerns for screening and prevention. These are reviewed and up-to-date. Referrals have been placed accordingly. Immunizations are up-to-date or declined.    Subjective:   Chief Complaint  Patient presents with   Hypertension    Alyssa Peterson 72 y.o. female presents to office today for follow up to HTN  She has a past medical history of Anemia, GERD, Hypertension, and Inguinal hernia.    Blood pressure is well controlled today. She is taking amlodipine 10 mg daily as prescribed.  BP Readings from Last 3 Encounters:  02/11/23 128/73  01/24/23 (!) 147/84  11/06/22 127/75     She is concerned about unintentional weight loss however I have instructed her that her weight is actually up over the past several months.    Review of Systems  Constitutional:  Positive for weight loss. Negative for fever and malaise/fatigue.  HENT: Negative.  Negative for nosebleeds.   Eyes:  Positive for blurred vision. Negative for double vision and photophobia.  Respiratory: Negative.  Negative for cough and shortness of breath.   Cardiovascular: Negative.  Negative for chest pain, palpitations and leg swelling.  Gastrointestinal:  Negative.  Negative for heartburn, nausea and vomiting.  Musculoskeletal: Negative.  Negative for myalgias.  Neurological: Negative.  Negative for dizziness, focal weakness, seizures and headaches.  Psychiatric/Behavioral: Negative.  Negative for suicidal ideas.     Past Medical History:  Diagnosis Date   Anemia    Hx   Arthritis    Diaphragmatic hernia with obstruction    Gastric volvulus    GERD (gastroesophageal reflux disease)    Glaucoma    History of hiatal hernia    Hypertension    Inguinal hernia    left   Pulmonary embolism [facility])     Past Surgical History:  Procedure Laterality Date   COLONOSCOPY     CYST REMOVAL NECK     DENTAL SURGERY     FEMORAL HERNIA REPAIR Left 07/17/2016   Procedure: LEFT FEMORAL  HERNIA REPAIR WITH MESH;  Surgeon: Manus Rudd, MD;  Location: [facility];  Service: General;  Laterality: Left;   GASTROJEJUNOSTOMY N/A 07/04/2020   Procedure: GASTROSTOMY TUBE;  Surgeon: Berna Bue, MD;  Location: [facility] OR;  Service: General;  Laterality: N/A;   GASTROSTOMY N/A 03/27/2022   Procedure: INSERTION OF GASTROSTOMY TUBE;  Surgeon: Berna Bue, MD;  [facility];  Service: General;  Laterality: N/A;   HIATAL HERNIA REPAIR N/A 03/27/2022   Procedure: LAPAROSCOPIC REPAIR OF HIATAL HERNIA;  Surgeon: Berna Bue, MD;  [facility];  Service: General;  Laterality: N/A;   IVC FILTER INSERTION N/A 05/02/2022   Procedure: IVC FILTER INSERTION;  Surgeon: [name], MD;  Location: [facility];  Service: Cardiovascular;  Laterality: N/A;   IVC VENOGRAPHY N/A 05/02/2022   Procedure: IVC Venography;  Surgeon: [name], MD;  [facility];  Service: Cardiovascular;  Laterality: N/A;   LAPAROSCOPIC LYSIS OF ADHESIONS N/A 03/27/2022   Procedure: LAPAROSCOPIC LYSIS OF ADHESIONS;  Surgeon: Berna Bue, MD;  [facility];  Service: General;  Laterality: N/A;   LAPAROTOMY N/A 07/04/2020    Procedure: EXPLORATORY LAPAROTOMY;  Surgeon: Berna Bue, MD;  [facility];  Service: General;  Laterality: N/A;   LASIK  2023   PLEURAL EFFUSION DRAINAGE Right 05/07/2022   Procedure: DRAINAGE OF PLEURAL EFFUSION;  Surgeon: Loreli Slot, MD;  [facility];  Service: Thoracic;  Laterality: Right;   VIDEO ASSISTED THORACOSCOPY (VATS)/DECORTICATION Right 05/07/2022   Procedure: VIDEO ASSISTED THORACOSCOPY (VATS)/DECORTICATION;  Surgeon: Loreli Slot, MD;  Location: [facility] OR;  Service: Thoracic;  Laterality: Right;    History reviewed. No pertinent family history.  Social History Reviewed with no changes to be made today.   Outpatient Medications Prior to Visit  Medication Sig Dispense Refill   acetaminophen (TYLENOL) 325 MG tablet Take 2 tablets (650 mg total) by mouth every 6 (six) hours as needed for mild pain (or temp > 100). (Patient not taking: Reported on 07/16/2022)     apixaban (ELIQUIS) 5 MG TABS tablet Take 5 mg by mouth 2 (two) times daily.     Calcium Carbonate Antacid (TUMS PO) Take 2 tablets by mouth daily as needed (Heartburn).     gabapentin (NEURONTIN) 300 MG capsule Take 1 capsule (300 mg total) by mouth at bedtime. (Patient not taking: Reported on 07/16/2022) 30 capsule 0   HYDROcodone-acetaminophen (NORCO/VICODIN) 5-325 MG tablet Take 1 tablet by mouth every 4 (four) hours as needed for moderate pain. 20 tablet 0   Hydrocortisone (CORTIZONE-10 EX) Apply 1 Application topically daily as needed (Itching).     LUMIGAN 0.01 % SOLN Place 1 drop into both eyes at bedtime. 2.5 mL 0   ondansetron (ZOFRAN-ODT) 4 MG disintegrating tablet Take 1 tablet (4 mg total) by mouth every 8 (eight) hours as needed for nausea or vomiting. 20 tablet 0   pantoprazole (PROTONIX) 40 MG tablet Take 1 tablet (40 mg total) by mouth daily. 90 tablet 0   traMADol (ULTRAM) 50 MG tablet Take 1 tablet (50 mg total) by mouth every 6 (six) hours as needed for moderate pain (mild pain).  (Patient not taking: Reported on 07/16/2022) 28 tablet 0   triamcinolone cream (KENALOG) 0.1 % Apply 1 Application topically 2 (two) times daily. 45 g 2   amLODipine (NORVASC) 10 MG tablet TAKE 1 TABLET(10 MG) BY MOUTH AT BEDTIME 30 tablet 0   No facility-administered medications prior to visit.    Allergies  Allergen Reactions   Chocolate Hives   Latex Itching    hands       Objective:    BP 128/73   Pulse 72   Ht 5' (1.524 m)   Wt 114 lb 6.4 oz (51.9 kg)   SpO2 100%   BMI 22.34 kg/m  Wt Readings from Last 3 Encounters:  02/11/23 114 lb 6.4 oz (51.9 kg)  08/14/22 114 lb 12.8 oz (52.1 kg)  07/17/22 112 lb (50.8 kg)    Physical Exam Vitals and nursing note reviewed.  Constitutional:      Appearance: She is well-developed.  HENT:  Head: Normocephalic and atraumatic.  Cardiovascular:     Rate and Rhythm: Normal rate and regular rhythm.     Heart sounds: Normal heart sounds. No murmur heard.    No friction rub. No gallop.  Pulmonary:     Effort: Pulmonary effort is normal. No tachypnea or respiratory distress.     Breath sounds: Normal breath sounds. No decreased breath sounds, wheezing, rhonchi or rales.  Chest:     Chest wall: No tenderness.  Abdominal:     General: Bowel sounds are normal.     Palpations: Abdomen is soft.  Musculoskeletal:        General: Normal range of motion.     Cervical back: Normal range of motion.  Skin:    General: Skin is warm and dry.  Neurological:     Mental Status: She is alert and oriented to person, place, and time.     Coordination: Coordination normal.  Psychiatric:        Behavior: Behavior normal. Behavior is cooperative.        Thought Content: Thought content normal.        Judgment: Judgment normal.          Patient has been counseled extensively about nutrition and exercise as well as the importance of adherence with medications and regular follow-up. The patient was given clear instructions to go to ER or  return to medical center if symptoms don't improve, worsen or new problems develop. The patient verbalized understanding.   Follow-up: Return in about 3 months (around 05/14/2023).   Claiborne Rigg, FNP-BC [facility] [place], [place] [phone]   03/09/2023, 12:10 AM

## 2023-02-12 ENCOUNTER — Other Ambulatory Visit: Payer: Self-pay | Admitting: Nurse Practitioner

## 2023-02-12 DIAGNOSIS — I10 Essential (primary) hypertension: Secondary | ICD-10-CM

## 2023-02-12 LAB — CMP14+EGFR
ALT: 12 [IU]/L (ref 0–32)
AST: 19 [IU]/L (ref 0–40)
Albumin: 4.2 g/dL (ref 3.9–4.9)
Alkaline Phosphatase: 114 [IU]/L (ref 44–121)
BUN/Creatinine Ratio: 13 (ref 12–28)
BUN: 12 mg/dL (ref 8–27)
Bilirubin Total: 0.2 mg/dL (ref 0.0–1.2)
CO2: 24 mmol/L (ref 20–29)
Calcium: 9.8 mg/dL (ref 8.7–10.3)
Chloride: 105 mmol/L (ref 96–106)
Creatinine, Ser: 0.9 mg/dL (ref 0.57–1.00)
Globulin, Total: 2.9 g/dL (ref 1.5–4.5)
Glucose: 108 mg/dL — ABNORMAL HIGH (ref 70–99)
Potassium: 3.8 mmol/L (ref 3.5–5.2)
Sodium: 141 mmol/L (ref 134–144)
Total Protein: 7.1 g/dL (ref 6.0–8.5)
eGFR: 69 mL/min/{1.73_m2} (ref >59)

## 2023-02-12 LAB — CBC WITH DIFFERENTIAL/PLATELET
Basophils Absolute: 0 10*3/uL (ref 0.0–0.2)
Basos: 1 %
EOS (ABSOLUTE): 0.2 10*3/uL (ref 0.0–0.4)
Eos: 5 %
Hematocrit: 33.1 % — ABNORMAL LOW (ref 34.0–46.6)
Hemoglobin: 10 g/dL — ABNORMAL LOW (ref 11.1–15.9)
Immature Grans (Abs): 0 10*3/uL (ref 0.0–0.1)
Immature Granulocytes: 0 %
Lymphocytes Absolute: 1.5 10*3/uL (ref 0.7–3.1)
Lymphs: 39 %
MCH: 22.5 pg — ABNORMAL LOW (ref 26.6–33.0)
MCHC: 30.2 g/dL — ABNORMAL LOW (ref 31.5–35.7)
MCV: 74 fL — ABNORMAL LOW (ref 79–97)
Monocytes Absolute: 0.5 10*3/uL (ref 0.1–0.9)
Monocytes: 13 %
Neutrophils Absolute: 1.5 10*3/uL (ref 1.4–7.0)
Neutrophils: 42 %
Platelets: 275 10*3/uL (ref 150–450)
RBC: 4.45 x10E6/uL (ref 3.77–5.28)
RDW: 16.3 % — ABNORMAL HIGH (ref 11.7–15.4)
WBC: 3.7 10*3/uL (ref 3.4–10.8)

## 2023-02-12 LAB — VITAMIN D 25 HYDROXY (VIT D DEFICIENCY, FRACTURES): Vit D, 25-Hydroxy: 8.4 ng/mL — ABNORMAL LOW (ref 30.0–100.0)

## 2023-02-15 ENCOUNTER — Other Ambulatory Visit: Payer: Self-pay | Admitting: Nurse Practitioner

## 2023-02-15 DIAGNOSIS — D508 Other iron deficiency anemias: Secondary | ICD-10-CM

## 2023-02-15 DIAGNOSIS — E559 Vitamin D deficiency, unspecified: Secondary | ICD-10-CM

## 2023-02-15 MED ORDER — VITAMIN D (ERGOCALCIFEROL) 1.25 MG (50000 UNIT) PO CAPS: 50000.0000 [IU] | ORAL_CAPSULE | ORAL | 0 refills | Status: DC

## 2023-02-16 ENCOUNTER — Telehealth: Payer: Self-pay | Admitting: *Deleted

## 2023-02-16 NOTE — Telephone Encounter (Signed)
  Chief Complaint: Results Symptoms: NA Frequency: NA Pertinent Negatives: Patient denies NA Disposition: [] ED /[] Urgent Care (no appt availability in office) / [] Appointment(In office/virtual)/ []  [facility]/ [] Home Care/ [] Refused Recommended Disposition /[]  Mobile Bus/ []  Follow-up with PCP Additional Notes:   Result note read to pt, verbalizes understanding.    "Kidney, liver function and electrolytes are normal.     Vitamin D is low. Will send script for weekly vitamin D that you will take for 3 months. After that you can purchase Vitamin D over the counter 2000-5000 units daily.   Your hemoglobin levels are low. I referred you to the blood specialist last year and After leaving several voicemails and not being able to reach you they closed the referral. I have placed another referral. It is very important that you follow up [phone]"

## 2023-03-11 ENCOUNTER — Inpatient Hospital Stay: Payer: Medicare Other

## 2023-03-11 ENCOUNTER — Inpatient Hospital Stay: Payer: Medicare Other | Attending: Oncology | Admitting: Oncology

## 2023-03-11 VITALS — BP 140/86 | HR 78 | Temp 97.4°F | Resp 18 | Wt 110.2 lb

## 2023-03-11 DIAGNOSIS — D509 Iron deficiency anemia, unspecified: Secondary | ICD-10-CM | POA: Diagnosis not present

## 2023-03-11 DIAGNOSIS — Z7901 Long term (current) use of anticoagulants: Secondary | ICD-10-CM | POA: Diagnosis not present

## 2023-03-11 DIAGNOSIS — K219 Gastro-esophageal reflux disease without esophagitis: Secondary | ICD-10-CM | POA: Insufficient documentation

## 2023-03-11 DIAGNOSIS — Z86711 Personal history of pulmonary embolism: Secondary | ICD-10-CM | POA: Diagnosis not present

## 2023-03-11 DIAGNOSIS — I2699 Other pulmonary embolism without acute cor pulmonale: Secondary | ICD-10-CM

## 2023-03-11 DIAGNOSIS — Z79899 Other long term (current) drug therapy: Secondary | ICD-10-CM | POA: Insufficient documentation

## 2023-03-11 LAB — COMPREHENSIVE METABOLIC PANEL
ALT: 13 U/L (ref 0–44)
AST: 16 U/L (ref 15–41)
Albumin: 4.1 g/dL (ref 3.5–5.0)
Alkaline Phosphatase: 86 U/L (ref 38–126)
Anion gap: 5 (ref 5–15)
BUN: 11 mg/dL (ref 8–23)
CO2: 30 mmol/L (ref 22–32)
Calcium: 9.5 mg/dL (ref 8.9–10.3)
Chloride: 107 mmol/L (ref 98–111)
Creatinine, Ser: 0.74 mg/dL (ref 0.44–1.00)
GFR, Estimated: >60 mL/min (ref >60)
Glucose, Bld: 95 mg/dL (ref 70–99)
Potassium: 3.3 mmol/L — ABNORMAL LOW (ref 3.5–5.1)
Sodium: 142 mmol/L (ref 135–145)
Total Bilirubin: 0.4 mg/dL (ref <1.2)
Total Protein: 7.2 g/dL (ref 6.5–8.1)

## 2023-03-11 LAB — LACTATE DEHYDROGENASE: LDH: 150 U/L (ref 98–192)

## 2023-03-11 LAB — CBC WITH DIFFERENTIAL/PLATELET
Abs Immature Granulocytes: 0.02 10*3/uL (ref 0.00–0.07)
Basophils Absolute: 0 10*3/uL (ref 0.0–0.1)
Basophils Relative: 1 %
Eosinophils Absolute: 0.2 10*3/uL (ref 0.0–0.5)
Eosinophils Relative: 3 %
HCT: 34.5 % — ABNORMAL LOW (ref 36.0–46.0)
Hemoglobin: 10.2 g/dL — ABNORMAL LOW (ref 12.0–15.0)
Immature Granulocytes: 0 %
Lymphocytes Relative: 14 %
Lymphs Abs: 0.9 10*3/uL (ref 0.7–4.0)
MCH: 22.1 pg — ABNORMAL LOW (ref 26.0–34.0)
MCHC: 29.6 g/dL — ABNORMAL LOW (ref 30.0–36.0)
MCV: 74.8 fL — ABNORMAL LOW (ref 80.0–100.0)
Monocytes Absolute: 0.4 10*3/uL (ref 0.1–1.0)
Monocytes Relative: 6 %
Neutro Abs: 4.9 10*3/uL (ref 1.7–7.7)
Neutrophils Relative %: 76 %
Platelets: 300 10*3/uL (ref 150–400)
RBC: 4.61 MIL/uL (ref 3.87–5.11)
RDW: 17.9 % — ABNORMAL HIGH (ref 11.5–15.5)
WBC: 6.4 10*3/uL (ref 4.0–10.5)
nRBC: 0 % (ref 0.0–0.2)

## 2023-03-11 LAB — IRON AND IRON BINDING CAPACITY (CC-WL,HP ONLY)
Iron: 25 ug/dL — ABNORMAL LOW (ref 28–170)
Saturation Ratios: 5 % — ABNORMAL LOW (ref 10.4–31.8)
TIBC: 505 ug/dL — ABNORMAL HIGH (ref 250–450)
UIBC: 480 ug/dL — ABNORMAL HIGH (ref 148–442)

## 2023-03-11 LAB — D-DIMER, QUANTITATIVE: D-Dimer, Quant: 1.39 ug{FEU}/mL — ABNORMAL HIGH (ref 0.00–0.50)

## 2023-03-11 LAB — FOLATE: Folate: 16.2 ng/mL (ref >5.9)

## 2023-03-11 LAB — VITAMIN B12: Vitamin B-12: 314 pg/mL (ref 180–914)

## 2023-03-11 LAB — FERRITIN: Ferritin: 7 ng/mL — ABNORMAL LOW (ref 11–307)

## 2023-03-11 LAB — TSH: TSH: 0.513 u[IU]/mL (ref 0.350–4.500)

## 2023-03-11 NOTE — Progress Notes (Unsigned)
[facility]  HEMATOLOGY CLINIC CONSULTATION NOTE    PATIENT NAME: Alyssa Peterson   MR#: [id number] DOB: 1951/02/10  DATE OF SERVICE: 03/11/2023  Patient Care Team: Claiborne Rigg, NP as PCP - General (Nurse Practitioner)  REASON FOR CONSULTATION/ CHIEF COMPLAINT:  Evaluation of anemia.  HISTORY OF PRESENT ILLNESS:  Alyssa Peterson is a 72 y.o. lady with a past medical history of pulmonary embolism diagnosed in January 2024, completed 6 months of anticoagulation with Eliquis, hypertension, GERD, inguinal hernia, was referred to our service for evaluation of anemia.    Discussed the use of AI scribe software for clinical note transcription with the patient, who gave verbal consent to proceed.   On routine follow-up with her PCP on 02/11/2023, labs showed hemoglobin of 10, hematocrit 33.1, MCV 74.  White count was 3700 with ANC of 1000.8, normal differential.  Platelets were normal at 270,000.  Previously hemoglobin was 9.6 in February 2024.  Given persistent anemia, referral was sent to [place] for further evaluation.  She has a history of anemia requiring iron infusions and blood transfusions approximately 22-24 years ago. The patient reports no recent blood loss. However, she did have significant blood loss due to blood clots prior to menopause. She has not been on iron supplements recently but tolerated them well in the past. The patient also had a blood clot in the lungs following hernia surgery in January of this year, for which she was on blood thinners for six months. She is currently off most of her medications, with the exception of amlodipine for blood pressure, eye drops, vitamin D, and an antacid as needed.  She denies recent chest pain on exertion, shortness of breath on minimal exertion, pre-syncopal episodes, or palpitations.  She had not noticed any recent bleeding such as epistaxis, hematuria or hematochezia.   Her last colonoscopy was more than 5  years ago.   MEDICAL HISTORY:  Past Medical History:  Diagnosis Date   Anemia    Hx   Arthritis    Diaphragmatic hernia with obstruction    Gastric volvulus    GERD (gastroesophageal reflux disease)    Glaucoma    History of hiatal hernia    Hypertension    Inguinal hernia    left   Pulmonary embolism (HCC)     SURGICAL HISTORY: Past Surgical History:  Procedure Laterality Date   COLONOSCOPY     CYST REMOVAL NECK     DENTAL SURGERY     FEMORAL HERNIA REPAIR Left 07/17/2016   Procedure: LEFT FEMORAL  HERNIA REPAIR WITH MESH;  Surgeon: Manus Rudd, MD;  Location: [facility];  Service: General;  Laterality: Left;   GASTROJEJUNOSTOMY N/A 07/04/2020   Procedure: GASTROSTOMY TUBE;  Surgeon: Berna Bue, MD;  Location: [facility] OR;  Service: General;  Laterality: N/A;   GASTROSTOMY N/A 03/27/2022   Procedure: INSERTION OF GASTROSTOMY TUBE;  Surgeon: Berna Bue, MD;  Location: [facility] OR;  Service: General;  Laterality: N/A;   HIATAL HERNIA REPAIR N/A 03/27/2022   Procedure: LAPAROSCOPIC REPAIR OF HIATAL HERNIA;  Surgeon: Berna Bue, MD;  Location: [facility] OR;  Service: General;  Laterality: N/A;   IVC FILTER INSERTION N/A 05/02/2022   Procedure: IVC FILTER INSERTION;  Surgeon: [name], MD;  Location: [facility] INVASIVE CV LAB;  Service: Cardiovascular;  Laterality: N/A;   IVC VENOGRAPHY N/A 05/02/2022   Procedure: IVC Venography;  Surgeon: [name], MD;  Location: [facility] INVASIVE CV  LAB;  Service: Cardiovascular;  Laterality: N/A;   LAPAROSCOPIC LYSIS OF ADHESIONS N/A 03/27/2022   Procedure: LAPAROSCOPIC LYSIS OF ADHESIONS;  Surgeon: Berna Bue, MD;  Location: [facility] OR;  Service: General;  Laterality: N/A;   LAPAROTOMY N/A 07/04/2020   Procedure: EXPLORATORY LAPAROTOMY;  Surgeon: Berna Bue, MD;  Location: [facility] OR;  Service: General;  Laterality: N/A;   LASIK  2023   PLEURAL EFFUSION DRAINAGE Right 05/07/2022   Procedure: DRAINAGE OF PLEURAL  EFFUSION;  Surgeon: Loreli Slot, MD;  Location: [facility] OR;  Service: Thoracic;  Laterality: Right;   VIDEO ASSISTED THORACOSCOPY (VATS)/DECORTICATION Right 05/07/2022   Procedure: VIDEO ASSISTED THORACOSCOPY (VATS)/DECORTICATION;  Surgeon: Loreli Slot, MD;  Location: [facility] OR;  Service: Thoracic;  Laterality: Right;    SOCIAL HISTORY: She reports that she has never smoked. She has never used smokeless tobacco. She reports that she does not drink alcohol and does not use drugs. Social History   Socioeconomic History   Marital status: Divorced    Spouse name: Not on file   Number of children: Not on file   Years of education: Not on file   Highest education level: Not on file  Occupational History   Not on file  Tobacco Use   Smoking status: Never   Smokeless tobacco: Never  Vaping Use   Vaping status: Never Used  Substance and Sexual Activity   Alcohol use: No    Alcohol/week: 0.0 standard drinks of alcohol   Drug use: No   Sexual activity: Not Currently  Other Topics Concern   Not on file  Social History Narrative   Not on file   Social Drivers of Health   Financial Resource Strain: Low Risk  (06/18/2022)   Overall Financial Resource Strain (CARDIA)    Difficulty of Paying Living Expenses: Not hard at all  Food Insecurity: No Food Insecurity (06/18/2022)   Hunger Vital Sign    Worried About Running Out of Food in the Last Year: Never true    Ran Out of Food in the Last Year: Never true  Transportation Needs: No Transportation Needs (06/18/2022)   PRAPARE - Administrator, Civil Service (Medical): No    Lack of Transportation (Non-Medical): No  Physical Activity: Inactive (06/18/2022)   Exercise Vital Sign    Days of Exercise per Week: 0 days    Minutes of Exercise per Session: 0 min  Stress: No Stress Concern Present (06/18/2022)   [employer] of Occupational Health - Occupational Stress Questionnaire    Feeling of Stress : Not at all   Social Connections: Not on file  Intimate Partner Violence: Not At Risk (03/24/2022)   Humiliation, Afraid, Rape, and Kick questionnaire    Fear of Current or Ex-Partner: No    Emotionally Abused: No    Physically Abused: No    Sexually Abused: No    FAMILY HISTORY: History reviewed. No pertinent family history.  ALLERGIES:  She is allergic to chocolate and latex.  MEDICATIONS:  Current Outpatient Medications  Medication Sig Dispense Refill   amLODipine (NORVASC) 10 MG tablet TAKE 1 TABLET(10 MG) BY MOUTH AT BEDTIME 90 tablet 0   Calcium Carbonate Antacid (TUMS PO) Take 2 tablets by mouth daily as needed (Heartburn).     gabapentin (NEURONTIN) 300 MG capsule Take 1 capsule (300 mg total) by mouth at bedtime. 30 capsule 0   Hydrocortisone (CORTIZONE-10 EX) Apply 1 Application topically daily as needed (Itching).     LUMIGAN 0.01 %  SOLN Place 1 drop into both eyes at bedtime. 2.5 mL 0   ondansetron (ZOFRAN-ODT) 4 MG disintegrating tablet Take 1 tablet (4 mg total) by mouth every 8 (eight) hours as needed for nausea or vomiting. 20 tablet 0   pantoprazole (PROTONIX) 40 MG tablet Take 1 tablet (40 mg total) by mouth daily. 90 tablet 0   triamcinolone cream (KENALOG) 0.1 % Apply 1 Application topically 2 (two) times daily. 45 g 2   Vitamin D, Ergocalciferol, (DRISDOL) 1.25 MG (50000 UNIT) CAPS capsule Take 1 capsule (50,000 Units total) by mouth once a week. 12 capsule 0   No current facility-administered medications for this visit.    REVIEW OF SYSTEMS:    Review of Systems - Oncology  All other pertinent systems were reviewed and were negative except as mentioned above.  PHYSICAL EXAMINATION:  ECOG PERFORMANCE STATUS: 1 - Symptomatic but completely ambulatory  Vitals:   03/11/23 1000  BP: (!) 140/86  Pulse: 78  Resp: 18  Temp: (!) 97.4 F (36.3 C)  SpO2: 100%   Filed Weights   03/11/23 1000  Weight: 110 lb 4 oz (50 kg)    Physical Exam Constitutional:       General: She is not in acute distress.    Appearance: Normal appearance.  HENT:     Head: Normocephalic and atraumatic.  Eyes:     General: No scleral icterus.    Conjunctiva/sclera: Conjunctivae normal.  Cardiovascular:     Rate and Rhythm: Normal rate and regular rhythm.     Heart sounds: Normal heart sounds.  Pulmonary:     Effort: Pulmonary effort is normal.     Breath sounds: Normal breath sounds.  Abdominal:     General: There is no distension.  Musculoskeletal:     Right lower leg: No edema.     Left lower leg: No edema.  Neurological:     General: No focal deficit present.     Mental Status: She is alert and oriented to person, place, and time.  Psychiatric:        Mood and Affect: Mood normal.        Behavior: Behavior normal.        Thought Content: Thought content normal.     LABORATORY DATA:   I have reviewed the data as listed.  Results for orders placed or performed in visit on 03/11/23  D-dimer, quantitative  Result Value Ref Range   D-Dimer, Quant 1.39 (H) 0.00 - 0.50 ug/mL-FEU  TSH  Result Value Ref Range   TSH 0.513 0.350 - 4.500 uIU/mL  Lactate dehydrogenase  Result Value Ref Range   LDH 150 98 - 192 U/L  Iron and Iron Binding Capacity ([facility] only)  Result Value Ref Range   Iron 25 (L) 28 - 170 ug/dL   TIBC 161 (H) 096 - 045 ug/dL   Saturation Ratios 5 (L) 10.4 - 31.8 %   UIBC 480 (H) 148 - 442 ug/dL  Vitamin W09  Result Value Ref Range   Vitamin B-12 314 180 - 914 pg/mL  Folate  Result Value Ref Range   Folate 16.2 >5.9 ng/mL  Ferritin  Result Value Ref Range   Ferritin 7 (L) 11 - 307 ng/mL  Comprehensive metabolic panel  Result Value Ref Range   Sodium 142 135 - 145 mmol/L   Potassium 3.3 (L) 3.5 - 5.1 mmol/L   Chloride 107 98 - 111 mmol/L   CO2 30 22 - 32 mmol/L   Glucose,  Bld 95 70 - 99 mg/dL   BUN 11 8 - 23 mg/dL   Creatinine, Ser 4.09 0.44 - 1.00 mg/dL   Calcium 9.5 8.9 - 81.1 mg/dL   Total Protein 7.2 6.5 - 8.1 g/dL    Albumin 4.1 3.5 - 5.0 g/dL   AST 16 15 - 41 U/L   ALT 13 0 - 44 U/L   Alkaline Phosphatase 86 38 - 126 U/L   Total Bilirubin 0.4 <1.2 mg/dL   GFR, Estimated >91 >47 mL/min   Anion gap 5 5 - 15  CBC with Differential/Platelet  Result Value Ref Range   WBC 6.4 4.0 - 10.5 K/uL   RBC 4.61 3.87 - 5.11 MIL/uL   Hemoglobin 10.2 (L) 12.0 - 15.0 g/dL   HCT 82.9 (L) 56.2 - 13.0 %   MCV 74.8 (L) 80.0 - 100.0 fL   MCH 22.1 (L) 26.0 - 34.0 pg   MCHC 29.6 (L) 30.0 - 36.0 g/dL   RDW 86.5 (H) 78.4 - 69.6 %   Platelets 300 150 - 400 K/uL   nRBC 0.0 0.0 - 0.2 %   Neutrophils Relative % 76 %   Neutro Abs 4.9 1.7 - 7.7 K/uL   Lymphocytes Relative 14 %   Lymphs Abs 0.9 0.7 - 4.0 K/uL   Monocytes Relative 6 %   Monocytes Absolute 0.4 0.1 - 1.0 K/uL   Eosinophils Relative 3 %   Eosinophils Absolute 0.2 0.0 - 0.5 K/uL   Basophils Relative 1 %   Basophils Absolute 0.0 0.0 - 0.1 K/uL   Immature Granulocytes 0 %   Abs Immature Granulocytes 0.02 0.00 - 0.07 K/uL     RADIOGRAPHIC STUDIES:  No pertinent imaging studies available to review.   ASSESSMENT & PLAN:   72 y.o. lady with a past medical history of pulmonary embolism diagnosed in January 2024, completed 6 months of anticoagulation with Eliquis, hypertension, GERD, inguinal hernia, was referred to our service for evaluation of anemia.    Iron deficiency anemia, unspecified She was noted to have microcytic anemia. History of anemia with prior iron and blood transfusions. No active bleeding.   -Labs today showed persistent anemia with hemoglobin of 10.2, hematocrit 34.5, MCV 74.8.  White count and platelet count are within normal limits.  CMP grossly unremarkable.  Ferritin decreased at 7, iron saturation decreased at 5%, iron decreased at 25, indicative of iron deficiency state.  B12, folate, LDH were all within normal limits.  -We will arrange for IV iron with Feraheme x 2 doses, 1 week apart, pending authorization.  Will arrange infusions at  our infusion center on [address].  - Colonoscopy recommended as last one was over five years ago.  - If anemia does not improve with iron supplementation, consider further diagnostic testing to determine the underlying cause.   Pulmonary embolus [facility]) Pulmonary embolism following hernia surgery in January 2024. Completed six-month course of Eliquis, discontinued in August. No current indication for anticoagulation therapy.  GERD (gastroesophageal reflux disease) Uses Protonix as needed for heartburn. No frequent or severe symptoms requiring daily medication. - Continue Protonix as needed for heartburn management.   Orders Placed This Encounter  Procedures   CBC with Differential/Platelet    Standing Status:   Future    Number of Occurrences:   1    Expiration Date:   03/10/2024   Comprehensive metabolic panel    Standing Status:   Future    Number of Occurrences:   1  Expiration Date:   03/10/2024   Ferritin    Standing Status:   Future    Number of Occurrences:   1    Expiration Date:   03/10/2024   Folate    Standing Status:   Future    Number of Occurrences:   1    Expiration Date:   03/10/2024   Vitamin B12    Standing Status:   Future    Number of Occurrences:   1    Expiration Date:   03/10/2024   Iron and Iron Binding Capacity ([facility] only)    Standing Status:   Future    Number of Occurrences:   1    Expiration Date:   03/10/2024   Lactate dehydrogenase    Standing Status:   Future    Number of Occurrences:   1    Expiration Date:   03/10/2024   Haptoglobin    Standing Status:   Future    Number of Occurrences:   1    Expiration Date:   03/10/2024   TSH    Standing Status:   Future    Number of Occurrences:   1    Expiration Date:   03/10/2024   D-dimer, quantitative    Standing Status:   Future    Number of Occurrences:   1    Expiration Date:   03/10/2024     I spent a total of 55 minutes during this encounter with the patient including  review of chart and various tests results, discussions about plan of care and coordination of care plan.  I reviewed lab results and outside records for this visit and discussed relevant results with the patient. Diagnosis, plan of care and treatment options were also discussed in detail with the patient. Opportunity provided to ask questions and answers provided to her apparent satisfaction. Provided instructions to call our clinic with any problems, questions or concerns prior to return visit. I recommended to continue follow-up with PCP and sub-specialists. She verbalized understanding and agreed with the plan. No barriers to learning was detected.   Future Appointments  Date Time Provider Department Center  05/15/2023  1:30 PM Claiborne Rigg, NP [facility] None  06/10/2023 10:30 AM [facility] None  06/10/2023 11:00 AM Amarah Brossman, Archie Patten, MD [facility] None     [name], MD [facility] - A DEPT OF [facility] [address] [place] [address] Dept: [phone] Dept Fax: [phone]     This document was completed utilizing speech recognition software. Grammatical errors, random word insertions, pronoun errors, and incomplete sentences are an occasional consequence of this system due to software limitations, ambient noise, and hardware issues. Any formal questions or concerns about the content, text or information contained within the body of this dictation should be directly addressed to the provider for clarification.

## 2023-03-12 ENCOUNTER — Telehealth: Payer: Self-pay | Admitting: Pharmacy Technician

## 2023-03-12 ENCOUNTER — Encounter: Payer: Self-pay | Admitting: Oncology

## 2023-03-12 ENCOUNTER — Other Ambulatory Visit: Payer: Self-pay | Admitting: Oncology

## 2023-03-12 DIAGNOSIS — K219 Gastro-esophageal reflux disease without esophagitis: Secondary | ICD-10-CM | POA: Insufficient documentation

## 2023-03-12 DIAGNOSIS — D509 Iron deficiency anemia, unspecified: Secondary | ICD-10-CM | POA: Insufficient documentation

## 2023-03-12 NOTE — Assessment & Plan Note (Addendum)
She was noted to have microcytic anemia. History of anemia with prior iron and blood transfusions. No active bleeding.   -Labs today showed persistent anemia with hemoglobin of 10.2, hematocrit 34.5, MCV 74.8.  White count and platelet count are within normal limits.  CMP grossly unremarkable.  Ferritin decreased at 7, iron saturation decreased at 5%, iron decreased at 25, indicative of iron deficiency state.  B12, folate, LDH were all within normal limits.  -We will arrange for IV iron with Feraheme x 2 doses, 1 week apart, pending authorization.  Will arrange infusions at our infusion center on [address].  - Colonoscopy recommended as last one was over five years ago.  - If anemia does not improve with iron supplementation, consider further diagnostic testing to determine the underlying cause.

## 2023-03-12 NOTE — Assessment & Plan Note (Addendum)
Pulmonary embolism following hernia surgery in January 2024. Completed six-month course of Eliquis, discontinued in August. No current indication for anticoagulation therapy.

## 2023-03-12 NOTE — Assessment & Plan Note (Signed)
Uses Protonix as needed for heartburn. No frequent or severe symptoms requiring daily medication. - Continue Protonix as needed for heartburn management.

## 2023-03-12 NOTE — Telephone Encounter (Signed)
Dr. Arlana Pouch, [place] Childes note: Patient will be scheduled as soon as possible.  Auth Submission: NO AUTH NEEDED Site of care: Site of care: [facility] Payer: [employer] MEDICARE Medication & CPT/J Code(s) submitted: Feraheme (ferumoxytol) [id number] Route of submission (phone, fax, portal): PORTAL Phone # Fax # Auth type: Buy/Bill PB Units/visits requested: X2 DOSES Reference number: [id number] Approval from: 03/12/23 to 04/24/23

## 2023-03-13 LAB — HAPTOGLOBIN: Haptoglobin: 122 mg/dL (ref 37–355)

## 2023-03-24 ENCOUNTER — Ambulatory Visit: Payer: Medicare Other

## 2023-03-24 VITALS — BP 130/77 | HR 75 | Temp 98.2°F | Resp 16 | Ht 64.0 in | Wt 110.8 lb

## 2023-03-24 DIAGNOSIS — D509 Iron deficiency anemia, unspecified: Secondary | ICD-10-CM

## 2023-03-24 MED ORDER — SODIUM CHLORIDE 0.9 % IV SOLN
510.0000 mg | Freq: Once | INTRAVENOUS | Status: AC
  Administered 2023-03-24: 510 mg via INTRAVENOUS
  Filled 2023-03-24: qty 17

## 2023-03-24 MED ORDER — DIPHENHYDRAMINE HCL 25 MG PO CAPS
25.0000 mg | ORAL_CAPSULE | Freq: Once | ORAL | Status: AC
Start: 2023-03-24 — End: 2023-03-24
  Administered 2023-03-24: 25 mg via ORAL
  Filled 2023-03-24: qty 1

## 2023-03-24 MED ORDER — ACETAMINOPHEN 325 MG PO TABS
650.0000 mg | ORAL_TABLET | Freq: Once | ORAL | Status: AC
  Administered 2023-03-24: 650 mg via ORAL
  Filled 2023-03-24: qty 2

## 2023-03-24 NOTE — Progress Notes (Signed)
 Diagnosis: Iron Deficiency Anemia  Provider:  Praveen Mannam MD  Procedure: IV Infusion  IV Type: Peripheral, IV Location: R Antecubital  Feraheme  (Ferumoxytol ), Dose: 510 mg  Infusion Start Time: 1440  Infusion Stop Time: 1500  Post Infusion IV Care: Observation period completed and Peripheral IV Discontinued  Discharge: Condition: Good, Destination: Home . AVS Provided  Performed by:  Maximiano JONELLE Pouch, LPN

## 2023-03-31 ENCOUNTER — Ambulatory Visit: Payer: Medicare Other

## 2023-03-31 VITALS — BP 122/84 | HR 63 | Temp 98.7°F | Resp 18 | Ht 64.0 in | Wt 111.8 lb

## 2023-03-31 DIAGNOSIS — D509 Iron deficiency anemia, unspecified: Secondary | ICD-10-CM

## 2023-03-31 MED ORDER — SODIUM CHLORIDE 0.9 % IV SOLN
510.0000 mg | Freq: Once | INTRAVENOUS | Status: AC
  Administered 2023-03-31: 510 mg via INTRAVENOUS
  Filled 2023-03-31: qty 17

## 2023-03-31 MED ORDER — ACETAMINOPHEN 325 MG PO TABS
650.0000 mg | ORAL_TABLET | Freq: Once | ORAL | Status: AC
  Administered 2023-03-31: 650 mg via ORAL
  Filled 2023-03-31: qty 2

## 2023-03-31 MED ORDER — DIPHENHYDRAMINE HCL 25 MG PO CAPS
25.0000 mg | ORAL_CAPSULE | Freq: Once | ORAL | Status: AC
  Administered 2023-03-31: 25 mg via ORAL
  Filled 2023-03-31: qty 1

## 2023-03-31 NOTE — Progress Notes (Signed)
 Diagnosis: Iron Deficiency Anemia  Provider:  Praveen Mannam MD  Procedure: IV Infusion  IV Type: Peripheral, IV Location: R Antecubital  Feraheme  (Ferumoxytol ), Dose: 510 mg  Infusion Start Time: 1427  Infusion Stop Time: 1443  Post Infusion IV Care: Observation period completed and Peripheral IV Discontinued  Discharge: Condition: Good, Destination: Home . AVS Provided  Performed by:  Leita FORBES Miles, LPN

## 2023-04-28 ENCOUNTER — Ambulatory Visit: Payer: Self-pay | Admitting: Nurse Practitioner

## 2023-04-28 NOTE — Telephone Encounter (Signed)
Patient on OV walkin schedule 04/29/2023

## 2023-04-28 NOTE — Telephone Encounter (Signed)
 Summary: cough, body aches   Copied From CRM [id number]. Reason for Triage: pt has body aches, cough, and thinks she may have the flu.  Would like an appt.          Chief Complaint: cough x 4 days Symptoms: cough, mild shortness of breath Frequency: constant Pertinent Negatives: Patient denies fever Disposition: [] ED /[x] Urgent Care (no appt availability in office) / [] Appointment(In office/virtual)/ []  [place] [facility]/ [] Home Care/ [] Refused Recommended Disposition /[] [facility]/ []  Follow-up with PCP Additional Notes: Patient reports cough x 4 days, now having some mild shortness of breath on exertion. Pt also reports body aches and runny nose. No appts avail at [facility], Pt advised to go to UC within 4 hours.  Pt agreeable.   Reason for Disposition  [1] MILD difficulty breathing (e.g., minimal/no SOB at rest, SOB with walking, pulse <100) AND [2] still present when not coughing  Answer Assessment - Initial Assessment Questions 1. ONSET: When did the cough begin?      Started 4 days ago  2. SEVERITY: How bad is the cough today?      Getting worse  3. SPUTUM: Describe the color of your sputum (none, dry cough; clear, white, yellow, green)     White sputum  4. HEMOPTYSIS: Are you coughing up any blood? If so ask: How much? (flecks, streaks, tablespoons, etc.)     No blood  5. DIFFICULTY BREATHING: Are you having difficulty breathing? If Yes, ask: How bad is it? (e.g., mild, moderate, severe)    - MILD: No SOB at rest, mild SOB with walking, speaks normally in sentences, can lie down, no retractions, pulse < 100.    - MODERATE: SOB at rest, SOB with minimal exertion and prefers to sit, cannot lie down flat, speaks in phrases, mild retractions, audible wheezing, pulse 100-120.    - SEVERE: Very SOB at rest, speaks in single words, struggling to breathe, sitting hunched forward, retractions, pulse > 120      Getting tired when moving around, Mild  Shortness of breath  6. FEVER: Do you have a fever? If Yes, ask: What is your temperature, how was it measured, and when did it start?     Unsure  7. CARDIAC HISTORY: Do you have any history of heart disease? (e.g., heart attack, congestive heart failure)      No  8. LUNG HISTORY: Do you have any history of lung disease?  (e.g., pulmonary embolus, asthma, emphysema)     No  9. PE RISK FACTORS: Do you have a history of blood clots? (or: recent major surgery, recent prolonged travel, bedridden)     Was on blood thinners from January 2024- August 2024 due to a blood clot developing after surgery  10. OTHER SYMPTOMS: Do you have any other symptoms? (e.g., runny nose, wheezing, chest pain)       Body aches, abd pain, nasal congestion, runny nose  11. PREGNANCY: Is there any chance you are pregnant? When was your last menstrual period?       No  12. TRAVEL: Have you traveled out of the country in the last month? (e.g., travel history, exposures)       No travel  Protocols used: Cough - Acute Productive-A-AH

## 2023-04-29 ENCOUNTER — Ambulatory Visit: Payer: Medicare Other | Attending: Family Medicine | Admitting: Nurse Practitioner

## 2023-04-29 ENCOUNTER — Encounter: Payer: Self-pay | Admitting: Nurse Practitioner

## 2023-04-29 VITALS — BP 111/70 | HR 70 | Temp 97.7°F | Resp 18 | Ht 64.0 in | Wt 109.6 lb

## 2023-04-29 DIAGNOSIS — J069 Acute upper respiratory infection, unspecified: Secondary | ICD-10-CM | POA: Diagnosis not present

## 2023-04-29 MED ORDER — BENZONATATE 100 MG PO CAPS: 100.0000 mg | ORAL_CAPSULE | Freq: Two times a day (BID) | ORAL | 0 refills | Status: DC | PRN

## 2023-04-29 NOTE — Progress Notes (Signed)
 I have seen and examined this patient with the advanced practice provider STUDENT and agree with the above note .

## 2023-04-29 NOTE — Progress Notes (Signed)
 Assessment & Plan:  Alyssa Peterson was seen today for uri.  Diagnoses and all orders for this visit:  Upper respiratory infection, viral CBC benzonatate  (TESSALON ) 100 MG capsule; Take 1 capsule (100 mg total) by mouth 2 (two) times daily as needed for cough. INSTRUCTIONS: use a humidifier for nasal congestion Drink plenty of fluids, rest and wash hands frequently to avoid the spread of infection Alternate tylenol  and Motrin for relief of fever  Return to work on Monday, 05/04/2023 Return on 05/26/2023 for scheduled follow up appt  Patient has been counseled on age-appropriate routine health concerns for screening and prevention. These are reviewed and up-to-date. Referrals have been placed accordingly. Immunizations are up-to-date or declined.    Subjective:   Chief Complaint  Patient presents with   URI    Alyssa Peterson 73 y.o. female presents to office today for cough, nasal congestion and increased fatigue symptoms which started on 7 days ago.. Reports her symptoms progressively worsened to body aches, diarrhea, chills and weakness 4 days ago. Reports taking Mucinex  Day/Night which help relieve symptoms. Patient reported being out of work on 04/27/2023 and returned on 04/28/2023. Continues with cough and fatigue, with cough worse at night. Reports husband had same symptoms but is recovering. She is able to eat and drink, and states she did not lose her appetite during illness. 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 Will provide pt with a work note, to return to work on Monday, 05/04/2023 to allow patient to recover.      URI  Associated symptoms include coughing and headaches. Pertinent negatives include no chest pain, nausea or vomiting.    Review of Systems  Constitutional:  Positive for chills and malaise/fatigue.  HENT: Negative.    Eyes: Negative.   Respiratory:  Positive for cough and sputum production.   Cardiovascular: Negative.  Negative for  chest pain.  Gastrointestinal:  Negative for nausea and vomiting.  Genitourinary: Negative.   Musculoskeletal:  Positive for myalgias.  Skin: Negative.   Neurological:  Positive for weakness and headaches. Negative for dizziness.  Endo/Heme/Allergies: Negative.   Psychiatric/Behavioral: Negative.      Past Medical History:  Diagnosis Date   Anemia    Hx   Arthritis    Diaphragmatic hernia with obstruction    Gastric volvulus    GERD (gastroesophageal reflux disease)    Glaucoma    History of hiatal hernia    Hypertension    Inguinal hernia    left   Pulmonary embolism [facility])     Past Surgical History:  Procedure Laterality Date   COLONOSCOPY     CYST REMOVAL NECK     DENTAL SURGERY     FEMORAL HERNIA REPAIR Left 07/17/2016   Procedure: LEFT FEMORAL  HERNIA REPAIR WITH MESH;  Surgeon: Donnice Lima, MD;  Location: [facility];  Service: General;  Laterality: Left;   GASTROJEJUNOSTOMY N/A 07/04/2020   Procedure: GASTROSTOMY TUBE;  Surgeon: Signe Mitzie LABOR, MD;  Location: [facility] OR;  Service: General;  Laterality: N/A;   GASTROSTOMY N/A 03/27/2022   Procedure: INSERTION OF GASTROSTOMY TUBE;  Surgeon: Signe Mitzie LABOR, MD;  Location: [facility] OR;  Service: General;  Laterality: N/A;   HIATAL HERNIA REPAIR N/A 03/27/2022   Procedure: LAPAROSCOPIC REPAIR OF HIATAL HERNIA;  Surgeon: Signe Mitzie LABOR, MD;  Location: [facility] OR;  Service: General;  Laterality: N/A;   IVC FILTER INSERTION N/A 05/02/2022   Procedure: IVC FILTER INSERTION;  Surgeon: Magda Ned  N, MD;  [facility]: [facility];  Service: Cardiovascular;  Laterality: N/A;   IVC VENOGRAPHY N/A 05/02/2022   Procedure: IVC Venography;  Surgeon: Magda Debby SAILOR, MD;  [facility]: [facility];  Service: Cardiovascular;  Laterality: N/A;   LAPAROSCOPIC LYSIS OF ADHESIONS N/A 03/27/2022   Procedure: LAPAROSCOPIC LYSIS OF ADHESIONS;  Surgeon: Signe Mitzie LABOR, MD;  Location: [facility] OR;  Service: General;  Laterality:  N/A;   LAPAROTOMY N/A 07/04/2020   Procedure: EXPLORATORY LAPAROTOMY;  Surgeon: Signe Mitzie LABOR, MD;  Location: [facility];  Service: General;  Laterality: N/A;   LASIK  2023   PLEURAL EFFUSION DRAINAGE Right 05/07/2022   Procedure: DRAINAGE OF PLEURAL EFFUSION;  Surgeon: Kerrin Elspeth BROCKS, MD;  Location: [facility] OR;  Service: Thoracic;  Laterality: Right;   VIDEO ASSISTED THORACOSCOPY (VATS)/DECORTICATION Right 05/07/2022   Procedure: VIDEO ASSISTED THORACOSCOPY (VATS)/DECORTICATION;  Surgeon: Kerrin Elspeth BROCKS, MD;  Location: [facility] OR;  Service: Thoracic;  Laterality: Right;    History reviewed. No pertinent family history.  Social History Reviewed with no changes to be made today.   Outpatient Medications Prior to Visit  Medication Sig Dispense Refill   amLODipine  (NORVASC ) 10 MG tablet TAKE 1 TABLET(10 MG) BY MOUTH AT BEDTIME 90 tablet 0   Hydrocortisone (CORTIZONE-10 EX) Apply 1 Application topically daily as needed (Itching).     LUMIGAN  0.01 % SOLN Place 1 drop into both eyes at bedtime. 2.5 mL 0   triamcinolone  cream (KENALOG ) 0.1 % Apply 1 Application topically 2 (two) times daily. 45 g 2   Calcium  Carbonate Antacid (TUMS PO) Take 2 tablets by mouth daily as needed (Heartburn). (Patient not taking: Reported on 04/29/2023)     gabapentin  (NEURONTIN ) 300 MG capsule Take 1 capsule (300 mg total) by mouth at bedtime. (Patient not taking: Reported on 04/29/2023) 30 capsule 0   ondansetron  (ZOFRAN -ODT) 4 MG disintegrating tablet Take 1 tablet (4 mg total) by mouth every 8 (eight) hours as needed for nausea or vomiting. (Patient not taking: Reported on 04/29/2023) 20 tablet 0   pantoprazole  (PROTONIX ) 40 MG tablet Take 1 tablet (40 mg total) by mouth daily. (Patient not taking: Reported on 04/29/2023) 90 tablet 0   Vitamin D , Ergocalciferol , (DRISDOL ) 1.25 MG (50000 UNIT) CAPS capsule Take 1 capsule (50,000 Units total) by mouth once a week. (Patient not taking: Reported on 04/29/2023) 12 capsule 0   No  facility-administered medications prior to visit.    Allergies  Allergen Reactions   Chocolate Hives   Latex Itching    hands       Objective:    BP 111/70 (BP Location: Left Arm, Patient Position: Sitting, Cuff Size: Small)   Pulse 70   Temp 97.7 F (36.5 C) (Oral)   Resp 18   Ht 5' 4 (1.626 m)   Wt 109 lb 9.6 oz (49.7 kg)   SpO2 98%   BMI 18.81 kg/m  Wt Readings from Last 3 Encounters:  04/29/23 109 lb 9.6 oz (49.7 kg)  03/31/23 111 lb 12.8 oz (50.7 kg)  03/24/23 110 lb 12.8 oz (50.3 kg)    Physical Exam Vitals reviewed.  Constitutional:      Appearance: Normal appearance. She is normal weight.  HENT:     Head: Normocephalic.     Right Ear: External ear normal.     Left Ear: External ear normal.     Nose: Congestion and rhinorrhea present.     Mouth/Throat:     Pharynx: Oropharynx is clear.  No posterior oropharyngeal erythema.  Eyes:     Conjunctiva/sclera: Conjunctivae normal.  Cardiovascular:     Rate and Rhythm: Normal rate and regular rhythm.     Heart sounds: Normal heart sounds.  Pulmonary:     Effort: Pulmonary effort is normal.     Breath sounds: Normal breath sounds. No wheezing.  Musculoskeletal:        General: Normal range of motion.     Cervical back: Normal range of motion and neck supple.  Skin:    General: Skin is warm and dry.  Neurological:     Mental Status: She is alert and oriented to person, place, and time.  Psychiatric:        Mood and Affect: Mood normal.        Behavior: Behavior normal. Behavior is cooperative.         Patient has been counseled extensively about nutrition and exercise as well as the importance of adherence with medications and regular follow-up. The patient was given clear instructions to go to ER or return to medical center if symptoms don't improve, worsen or new problems develop. The patient verbalized understanding.   Follow-up: Return if symptoms worsen or fail to improve.   Haze LELON Servant, RN  BSN---FNP Student [facility] and [facility] [place], [place] [phone]   04/29/2023, 10:55 AM

## 2023-04-29 NOTE — Progress Notes (Signed)
 Onset of symptoms of  04/20/23. Cough Diarrhea Chills Fatigue  OTC meds with no relief.

## 2023-05-01 DIAGNOSIS — H401114 Primary open-angle glaucoma, right eye, indeterminate stage: Secondary | ICD-10-CM | POA: Diagnosis not present

## 2023-05-15 ENCOUNTER — Ambulatory Visit: Payer: Medicare Other | Admitting: Nurse Practitioner

## 2023-05-17 ENCOUNTER — Other Ambulatory Visit: Payer: Self-pay | Admitting: Family Medicine

## 2023-05-17 DIAGNOSIS — I10 Essential (primary) hypertension: Secondary | ICD-10-CM

## 2023-05-26 ENCOUNTER — Ambulatory Visit: Payer: Medicare Other | Attending: Nurse Practitioner | Admitting: Nurse Practitioner

## 2023-05-26 ENCOUNTER — Encounter: Payer: Self-pay | Admitting: Nurse Practitioner

## 2023-05-26 VITALS — BP 134/85 | HR 64 | Wt 112.4 lb

## 2023-05-26 DIAGNOSIS — E876 Hypokalemia: Secondary | ICD-10-CM | POA: Diagnosis not present

## 2023-05-26 DIAGNOSIS — I10 Essential (primary) hypertension: Secondary | ICD-10-CM

## 2023-05-26 DIAGNOSIS — Z23 Encounter for immunization: Secondary | ICD-10-CM | POA: Diagnosis not present

## 2023-05-26 DIAGNOSIS — D508 Other iron deficiency anemias: Secondary | ICD-10-CM

## 2023-05-26 DIAGNOSIS — E559 Vitamin D deficiency, unspecified: Secondary | ICD-10-CM | POA: Diagnosis not present

## 2023-05-26 MED ORDER — VITAMIN D (ERGOCALCIFEROL) 1.25 MG (50000 UNIT) PO CAPS: 50000.0000 [IU] | ORAL_CAPSULE | ORAL | 0 refills | Status: DC

## 2023-05-26 NOTE — Addendum Note (Signed)
 Addended by: Dalene Carrow I on: 05/26/2023 02:28 PM   Modules accepted: Orders

## 2023-05-26 NOTE — Progress Notes (Signed)
 Assessment & Plan:  Alyssa Peterson was seen today for medical management of chronic issues.  Diagnoses and all orders for this visit:  Essential hypertension -     Basic metabolic panel Continue all antihypertensives as prescribed.  Reminded to bring in blood pressure log for follow  up appointment.  RECOMMENDATIONS: DASH/Mediterranean Diets are healthier choices for HTN.    Vitamin D deficiency disease -     VITAMIN D 25 Hydroxy (Vit-D Deficiency, Fractures) -     Vitamin D, Ergocalciferol, (DRISDOL) 1.25 MG (50000 UNIT) CAPS capsule; Take 1 capsule (50,000 Units total) by mouth once a week.  Hypokalemia -     Basic metabolic panel  Other iron deficiency anemia -     CBC with Differential  Need for pneumococcal 20-valent conjugate vaccination    Patient has been counseled on age-appropriate routine health concerns for screening and prevention. These are reviewed and up-to-date. Referrals have been placed accordingly. Immunizations are up-to-date or declined.    Subjective:   Chief Complaint  Patient presents with   Medical Management of Chronic Issues    Alyssa Peterson 73 y.o. female presents to office today for HTN  She has a surgical consultation in a few weeks for inguinal hernia.   She has a past medical history of Anemia, GERD, Hypertension, and Inguinal hernia.      Blood pressure is well controlled today. She is taking amlodipine 10 mg daily as prescribed.  BP Readings from Last 3 Encounters:  05/26/23 134/85  04/29/23 111/70  03/31/23 122/84     Unfortunately she took 4 capsules of vitamin D 4 days in a row instead of weekly.  Will need to recheck her vitamin D today and restart her weekly vitamin D.  Last vitamin D level reported: 8.  Review of Systems  Constitutional:  Negative for fever, malaise/fatigue and weight loss.  HENT: Negative.  Negative for nosebleeds.   Eyes: Negative.  Negative for blurred vision, double vision and photophobia.  Respiratory:  Negative.  Negative for cough and shortness of breath.   Cardiovascular: Negative.  Negative for chest pain, palpitations and leg swelling.  Gastrointestinal: Negative.  Negative for heartburn, nausea and vomiting.  Musculoskeletal: Negative.  Negative for myalgias.  Neurological: Negative.  Negative for dizziness, focal weakness, seizures and headaches.  Psychiatric/Behavioral: Negative.  Negative for suicidal ideas.     Past Medical History:  Diagnosis Date   Anemia    Hx   Arthritis    Diaphragmatic hernia with obstruction    Gastric volvulus    GERD (gastroesophageal reflux disease)    Glaucoma    History of hiatal hernia    Hypertension    Inguinal hernia    left   Pulmonary embolism [facility])     Past Surgical History:  Procedure Laterality Date   COLONOSCOPY     CYST REMOVAL NECK     DENTAL SURGERY     FEMORAL HERNIA REPAIR Left 07/17/2016   Procedure: LEFT FEMORAL  HERNIA REPAIR WITH MESH;  Surgeon: Manus Rudd, MD;  Location: [facility];  Service: General;  Laterality: Left;   GASTROJEJUNOSTOMY N/A 07/04/2020   Procedure: GASTROSTOMY TUBE;  Surgeon: Berna Bue, MD;  Location: [facility] OR;  Service: General;  Laterality: N/A;   GASTROSTOMY N/A 03/27/2022   Procedure: INSERTION OF GASTROSTOMY TUBE;  Surgeon: Berna Bue, MD;  Location[facility];  Service: General;  Laterality: N/A;   HIATAL HERNIA REPAIR N/A 03/27/2022   Procedure: LAPAROSCOPIC REPAIR  OF HIATAL HERNIA;  Surgeon: Berna Bue, MD;  Location: [facility] OR;  Service: General;  Laterality: N/A;   IVC FILTER INSERTION N/A 05/02/2022   Procedure: IVC FILTER INSERTION;  Surgeon: [name], MD;  [facility];  Service: Cardiovascular;  Laterality: N/A;   IVC VENOGRAPHY N/A 05/02/2022   Procedure: IVC Venography;  Surgeon: [name], MD;  [facility];  Service: Cardiovascular;  Laterality: N/A;   LAPAROSCOPIC LYSIS OF ADHESIONS N/A 03/27/2022    Procedure: LAPAROSCOPIC LYSIS OF ADHESIONS;  Surgeon: Berna Bue, MD;  Location: [facility];  Service: General;  Laterality: N/A;   LAPAROTOMY N/A 07/04/2020   Procedure: EXPLORATORY LAPAROTOMY;  Surgeon: Berna Bue, MD;  Location: [facility];  Service: General;  Laterality: N/A;   LASIK  2023   PLEURAL EFFUSION DRAINAGE Right 05/07/2022   Procedure: DRAINAGE OF PLEURAL EFFUSION;  Surgeon: Loreli Slot, MD;  Location: [facility] OR;  Service: Thoracic;  Laterality: Right;   VIDEO ASSISTED THORACOSCOPY (VATS)/DECORTICATION Right 05/07/2022   Procedure: VIDEO ASSISTED THORACOSCOPY (VATS)/DECORTICATION;  Surgeon: Loreli Slot, MD;  Location: [facility] OR;  Service: Thoracic;  Laterality: Right;    No family history on file.  Social History Reviewed with no changes to be made today.   Outpatient Medications Prior to Visit  Medication Sig Dispense Refill   amLODipine (NORVASC) 10 MG tablet TAKE 1 TABLET(10 MG) BY MOUTH AT BEDTIME 90 tablet 1   Hydrocortisone (CORTIZONE-10 EX) Apply 1 Application topically daily as needed (Itching).     LUMIGAN 0.01 % SOLN Place 1 drop into both eyes at bedtime. 2.5 mL 0   triamcinolone cream (KENALOG) 0.1 % Apply 1 Application topically 2 (two) times daily. 45 g 2   benzonatate (TESSALON) 100 MG capsule Take 1 capsule (100 mg total) by mouth 2 (two) times daily as needed for cough. 20 capsule 0   Calcium Carbonate Antacid (TUMS PO) Take 2 tablets by mouth daily as needed (Heartburn). (Patient not taking: Reported on 04/29/2023)     gabapentin (NEURONTIN) 300 MG capsule Take 1 capsule (300 mg total) by mouth at bedtime. (Patient not taking: Reported on 04/29/2023) 30 capsule 0   ondansetron (ZOFRAN-ODT) 4 MG disintegrating tablet Take 1 tablet (4 mg total) by mouth every 8 (eight) hours as needed for nausea or vomiting. (Patient not taking: Reported on 04/29/2023) 20 tablet 0   pantoprazole (PROTONIX) 40 MG tablet Take 1 tablet (40 mg total) by mouth daily. (Patient  not taking: Reported on 04/29/2023) 90 tablet 0   Vitamin D, Ergocalciferol, (DRISDOL) 1.25 MG (50000 UNIT) CAPS capsule Take 1 capsule (50,000 Units total) by mouth once a week. (Patient not taking: Reported on 04/29/2023) 12 capsule 0   No facility-administered medications prior to visit.    Allergies  Allergen Reactions   Chocolate Hives   Latex Itching    hands       Objective:    BP 134/85 (BP Location: Left Arm, Patient Position: Sitting, Cuff Size: Small)   Pulse 64   Wt 112 lb 6.4 oz (51 kg)   SpO2 98%   BMI 19.29 kg/m  Wt Readings from Last 3 Encounters:  05/26/23 112 lb 6.4 oz (51 kg)  04/29/23 109 lb 9.6 oz (49.7 kg)  03/31/23 111 lb 12.8 oz (50.7 kg)    Physical Exam Vitals and nursing note reviewed.  Constitutional:      Appearance: She is well-developed.  HENT:  Head: Normocephalic and atraumatic.  Cardiovascular:     Rate and Rhythm: Normal rate and regular rhythm.     Heart sounds: Normal heart sounds. No murmur heard.    No friction rub. No gallop.  Pulmonary:     Effort: Pulmonary effort is normal. No tachypnea or respiratory distress.     Breath sounds: Normal breath sounds. No decreased breath sounds, wheezing, rhonchi or rales.  Chest:     Chest wall: No tenderness.  Abdominal:     General: Bowel sounds are normal.     Palpations: Abdomen is soft.  Musculoskeletal:        General: Normal range of motion.     Cervical back: Normal range of motion.  Skin:    General: Skin is warm and dry.  Neurological:     Mental Status: She is alert and oriented to person, place, and time.     Coordination: Coordination normal.  Psychiatric:        Behavior: Behavior normal. Behavior is cooperative.        Thought Content: Thought content normal.        Judgment: Judgment normal.          Patient has been counseled extensively about nutrition and exercise as well as the importance of adherence with medications and regular follow-up. The patient was  given clear instructions to go to ER or return to medical center if symptoms don't improve, worsen or new problems develop. The patient verbalized understanding.   Follow-up: Return in about 3 months (around 08/26/2023).   Claiborne Rigg, FNP-BC [facility] [place], [place] [phone]   05/26/2023, 2:17 PM

## 2023-05-27 LAB — BASIC METABOLIC PANEL
BUN/Creatinine Ratio: 11 — ABNORMAL LOW (ref 12–28)
BUN: 8 mg/dL (ref 8–27)
CO2: 24 mmol/L (ref 20–29)
Calcium: 9.8 mg/dL (ref 8.7–10.3)
Chloride: 106 mmol/L (ref 96–106)
Creatinine, Ser: 0.74 mg/dL (ref 0.57–1.00)
Glucose: 81 mg/dL (ref 70–99)
Potassium: 4 mmol/L (ref 3.5–5.2)
Sodium: 144 mmol/L (ref 134–144)
eGFR: 87 mL/min/{1.73_m2} (ref >59)

## 2023-05-27 LAB — CBC WITH DIFFERENTIAL/PLATELET
Basophils Absolute: 0 10*3/uL (ref 0.0–0.2)
Basos: 1 %
EOS (ABSOLUTE): 0.4 10*3/uL (ref 0.0–0.4)
Eos: 10 %
Hematocrit: 38.6 % (ref 34.0–46.6)
Hemoglobin: 12.3 g/dL (ref 11.1–15.9)
Immature Grans (Abs): 0 10*3/uL (ref 0.0–0.1)
Immature Granulocytes: 0 %
Lymphocytes Absolute: 1.3 10*3/uL (ref 0.7–3.1)
Lymphs: 31 %
MCH: 27.2 pg (ref 26.6–33.0)
MCHC: 31.9 g/dL (ref 31.5–35.7)
MCV: 85 fL (ref 79–97)
Monocytes Absolute: 0.4 10*3/uL (ref 0.1–0.9)
Monocytes: 10 %
Neutrophils Absolute: 2.1 10*3/uL (ref 1.4–7.0)
Neutrophils: 48 %
Platelets: 192 10*3/uL (ref 150–450)
RBC: 4.52 x10E6/uL (ref 3.77–5.28)
RDW: 21.4 % — ABNORMAL HIGH (ref 11.7–15.4)
WBC: 4.3 10*3/uL (ref 3.4–10.8)

## 2023-05-27 LAB — VITAMIN D 25 HYDROXY (VIT D DEFICIENCY, FRACTURES): Vit D, 25-Hydroxy: 69.6 ng/mL (ref 30.0–100.0)

## 2023-06-02 DIAGNOSIS — H401133 Primary open-angle glaucoma, bilateral, severe stage: Secondary | ICD-10-CM | POA: Diagnosis not present

## 2023-06-08 ENCOUNTER — Ambulatory Visit
Admission: RE | Admit: 2023-06-08 | Discharge: 2023-06-08 | Disposition: A | Discharge status: Home / Self Care | Payer: Medicare Other | Source: Ambulatory Visit | Attending: Nurse Practitioner | Admitting: Nurse Practitioner

## 2023-06-08 DIAGNOSIS — Z1231 Encounter for screening mammogram for malignant neoplasm of breast: Secondary | ICD-10-CM | POA: Diagnosis not present

## 2023-06-10 ENCOUNTER — Other Ambulatory Visit: Payer: Self-pay | Admitting: Oncology

## 2023-06-10 ENCOUNTER — Inpatient Hospital Stay: Payer: Medicare Other | Admitting: Oncology

## 2023-06-10 ENCOUNTER — Inpatient Hospital Stay: Payer: Medicare Other | Attending: Oncology

## 2023-06-10 ENCOUNTER — Encounter: Payer: Self-pay | Admitting: Oncology

## 2023-06-10 DIAGNOSIS — D72819 Decreased white blood cell count, unspecified: Secondary | ICD-10-CM | POA: Insufficient documentation

## 2023-06-10 DIAGNOSIS — D509 Iron deficiency anemia, unspecified: Secondary | ICD-10-CM | POA: Insufficient documentation

## 2023-06-10 DIAGNOSIS — I10 Essential (primary) hypertension: Secondary | ICD-10-CM | POA: Insufficient documentation

## 2023-06-10 DIAGNOSIS — Z86711 Personal history of pulmonary embolism: Secondary | ICD-10-CM | POA: Insufficient documentation

## 2023-06-10 DIAGNOSIS — Z79899 Other long term (current) drug therapy: Secondary | ICD-10-CM | POA: Insufficient documentation

## 2023-06-10 DIAGNOSIS — Z7901 Long term (current) use of anticoagulants: Secondary | ICD-10-CM | POA: Insufficient documentation

## 2023-06-10 LAB — IRON AND IRON BINDING CAPACITY (CC-WL,HP ONLY)
Iron: 131 ug/dL (ref 28–170)
Saturation Ratios: 39 % — ABNORMAL HIGH (ref 10.4–31.8)
TIBC: 335 ug/dL (ref 250–450)
UIBC: 204 ug/dL (ref 148–442)

## 2023-06-10 LAB — CBC WITH DIFFERENTIAL (CANCER CENTER ONLY)
Abs Immature Granulocytes: 0 10*3/uL (ref 0.00–0.07)
Basophils Absolute: 0 10*3/uL (ref 0.0–0.1)
Basophils Relative: 1 %
Eosinophils Absolute: 0.3 10*3/uL (ref 0.0–0.5)
Eosinophils Relative: 9 %
HCT: 39 % (ref 36.0–46.0)
Hemoglobin: 13 g/dL (ref 12.0–15.0)
Immature Granulocytes: 0 %
Lymphocytes Relative: 38 %
Lymphs Abs: 1.3 10*3/uL (ref 0.7–4.0)
MCH: 28.4 pg (ref 26.0–34.0)
MCHC: 33.3 g/dL (ref 30.0–36.0)
MCV: 85.2 fL (ref 80.0–100.0)
Monocytes Absolute: 0.3 10*3/uL (ref 0.1–1.0)
Monocytes Relative: 10 %
Neutro Abs: 1.5 10*3/uL — ABNORMAL LOW (ref 1.7–7.7)
Neutrophils Relative %: 42 %
Platelet Count: 225 10*3/uL (ref 150–400)
RBC: 4.58 MIL/uL (ref 3.87–5.11)
RDW: 19.2 % — ABNORMAL HIGH (ref 11.5–15.5)
WBC Count: 3.4 10*3/uL — ABNORMAL LOW (ref 4.0–10.5)
nRBC: 0 % (ref 0.0–0.2)

## 2023-06-10 LAB — FERRITIN: Ferritin: 71 ng/mL (ref 11–307)

## 2023-06-10 NOTE — Assessment & Plan Note (Signed)
 Pulmonary embolism following hernia surgery in January 2024. Completed six-month course of Eliquis, discontinued in August. No current indication for anticoagulation therapy.

## 2023-06-10 NOTE — Progress Notes (Signed)
 [facility]  HEMATOLOGY CLINIC PROGRESS NOTE  PATIENT NAME: Alyssa Peterson   MR#: [id number] DOB: [birth year]  Patient Care Team: Claiborne Rigg, NP as PCP - General (Nurse Practitioner)  Date of visit: 06/10/2023   ASSESSMENT & PLAN:   Alyssa Peterson is a 73 y.o. pleasant lady with a past medical history of pulmonary embolism diagnosed in January 2024, completed 6 months of anticoagulation with Eliquis, hypertension, GERD, inguinal hernia, was referred to our service for evaluation of anemia.  Workup was consistent with iron deficiency anemia.  Treated with IV iron.  Iron deficiency anemia, unspecified She was noted to have microcytic anemia. History of anemia with prior iron and blood transfusions. No active bleeding.   On her initial consultation with [place] on 03/11/2023, labs showed persistent anemia with hemoglobin of 10.2, hematocrit 34.5, MCV 74.8.  White count and platelet count were within normal limits.  CMP grossly unremarkable.  Ferritin decreased at 7, iron saturation decreased at 5%, iron decreased at 25, indicative of iron deficiency state.  B12, folate, LDH were all within normal limits.   She was treated with IV iron using Feraheme x 2 doses.   Colonoscopy recommended as last one was over five years ago.   Repeat labs on 06/10/2023 showed much improved hemoglobin of 13, MCV normal at 85.  Iron studies showed no evidence of iron deficiency currently.  She was advised to resume taking oral iron supplements once daily with vitamin C.  RTC in 4 months for follow-up with repeat labs.   Leukopenia White blood cell count is slightly low at 3,400 cells/mcL, below the normal range of 4,000 cells/mcL. She had a flu infection about a month and a half ago but currently has no infection symptoms.  ANC is normal at 1500 - Monitor white blood cell count in follow-up visits. - Educate on signs of infection to watch for, given the low white blood cell  count.  Hx pulmonary embolism Pulmonary embolism following hernia surgery in January 2024. Completed six-month course of Eliquis, discontinued in August. No current indication for anticoagulation therapy.    I spent a total of 20 minutes during this encounter with the patient including review of chart and various tests results, discussions about plan of care and coordination of care plan.  I reviewed lab results and outside records for this visit and discussed relevant results with the patient. Diagnosis, plan of care and treatment options were also discussed in detail with the patient. Opportunity provided to ask questions and answers provided to her apparent satisfaction. Provided instructions to call our clinic with any problems, questions or concerns prior to return visit. I recommended to continue follow-up with PCP and sub-specialists. She verbalized understanding and agreed with the plan. No barriers to learning was detected.  Alyssa Crutch, MD  06/10/2023 1:05 PM   [facility] - A DEPT OF [facility] [address] [place] [address] Dept: [phone] Dept Fax: [phone]   CHIEF COMPLAINT/ REASON FOR VISIT:  Follow-up for iron deficiency anemia.  Unspecified iron deficiency.  INTERVAL HISTORY:  Discussed the use of AI scribe software for clinical note transcription with the patient, who gave verbal consent to proceed.   Patient returns for follow-up.  She presents for a follow-up after receiving IV iron treatment. She reports feeling better after the treatment. She also mentions having had the flu about a month and a half ago, but  currently has no infection symptoms. She is taking vitamin D once a week and has previously taken iron supplements. She has not been taking any iron supplements recently. She also had a mammogram on the 17th, which looked good. She denies any blood loss, such as blood in stools or  black stools.  SUMMARY OF HEMATOLOGIC HISTORY:  On routine follow-up with her PCP on 02/11/2023, labs showed hemoglobin of 10, hematocrit 33.1, MCV 74.  White count was 3700 with ANC of 1000.8, normal differential.  Platelets were normal at 270,000.  Previously hemoglobin was 9.6 in February 2024.  Given persistent anemia, referral was sent to [place] for further evaluation.   She has a history of anemia requiring iron infusions and blood transfusions approximately 22-24 years ago. The patient reports no recent blood loss. However, she did have significant blood loss due to blood clots prior to menopause. She has not been on iron supplements recently but tolerated them well in the past. The patient also had a blood clot in the lungs following hernia surgery in January of this year, for which she was on blood thinners for six months. She is currently off most of her medications, with the exception of amlodipine for blood pressure, eye drops, vitamin D, and an antacid as needed.   She denies recent chest pain on exertion, shortness of breath on minimal exertion, pre-syncopal episodes, or palpitations.   She had not noticed any recent bleeding such as epistaxis, hematuria or hematochezia.    Her last colonoscopy was more than 5 years ago.   On her initial consultation with [place] on 03/11/2023, labs showed persistent anemia with hemoglobin of 10.2, hematocrit 34.5, MCV 74.8.  White count and platelet count were within normal limits.  CMP grossly unremarkable.  Ferritin decreased at 7, iron saturation decreased at 5%, iron decreased at 25, indicative of iron deficiency state.  B12, folate, LDH were all within normal limits.   She was treated with IV iron using Feraheme x 2 doses.   Colonoscopy recommended as last one was over five years ago.   Repeat labs on 06/10/2023 showed much improved hemoglobin of 13, MCV normal at 85.  I have reviewed the past medical history, past surgical history, social history and  family history with the patient and they are unchanged from previous note.  ALLERGIES: She is allergic to chocolate and latex.  MEDICATIONS:  Current Outpatient Medications  Medication Sig Dispense Refill   amLODipine (NORVASC) 10 MG tablet TAKE 1 TABLET(10 MG) BY MOUTH AT BEDTIME 90 tablet 1   LUMIGAN 0.01 % SOLN Place 1 drop into both eyes at bedtime. 2.5 mL 0   triamcinolone cream (KENALOG) 0.1 % Apply 1 Application topically 2 (two) times daily. 45 g 2   Vitamin D, Ergocalciferol, (DRISDOL) 1.25 MG (50000 UNIT) CAPS capsule Take 1 capsule (50,000 Units total) by mouth once a week. 12 capsule 0   Hydrocortisone (CORTIZONE-10 EX) Apply 1 Application topically daily as needed (Itching). (Patient not taking: Reported on 06/10/2023)     No current facility-administered medications for this visit.     REVIEW OF SYSTEMS:    Review of Systems - Oncology  All other pertinent systems were reviewed with the patient and are negative.  PHYSICAL EXAMINATION:    Onc Performance Status - 06/10/23 1059       ECOG Perf Status   ECOG Perf Status Fully active, able to carry on all pre-disease performance without restriction      KPS SCALE  KPS % SCORE Normal, no compliants, no evidence of disease             There were no vitals filed for this visit. There were no vitals filed for this visit.  Physical Exam Constitutional:      General: She is not in acute distress.    Appearance: Normal appearance.  HENT:     Head: Normocephalic and atraumatic.  Eyes:     General: No scleral icterus.    Conjunctiva/sclera: Conjunctivae normal.  Cardiovascular:     Rate and Rhythm: Normal rate and regular rhythm.     Heart sounds: Normal heart sounds.  Pulmonary:     Effort: Pulmonary effort is normal.     Breath sounds: Normal breath sounds.  Abdominal:     General: There is no distension.  Musculoskeletal:     Right lower leg: No edema.     Left lower leg: No edema.  Neurological:      General: No focal deficit present.     Mental Status: She is alert and oriented to person, place, and time.  Psychiatric:        Mood and Affect: Mood normal.        Behavior: Behavior normal.        Thought Content: Thought content normal.     LABORATORY DATA:   I have reviewed the data as listed.  Results for orders placed or performed in visit on 06/10/23  Iron and Iron Binding Capacity ([facility] only)  Result Value Ref Range   Iron 131 28 - 170 ug/dL   TIBC 161 096 - 045 ug/dL   Saturation Ratios 39 (H) 10.4 - 31.8 %   UIBC 204 148 - 442 ug/dL  CBC with Differential (Cancer Center Only)  Result Value Ref Range   WBC Count 3.4 (L) 4.0 - 10.5 K/uL   RBC 4.58 3.87 - 5.11 MIL/uL   Hemoglobin 13.0 12.0 - 15.0 g/dL   HCT 40.9 81.1 - 91.4 %   MCV 85.2 80.0 - 100.0 fL   MCH 28.4 26.0 - 34.0 pg   MCHC 33.3 30.0 - 36.0 g/dL   RDW 78.2 (H) 95.6 - 21.3 %   Platelet Count 225 150 - 400 K/uL   nRBC 0.0 0.0 - 0.2 %   Neutrophils Relative % 42 %   Neutro Abs 1.5 (L) 1.7 - 7.7 K/uL   Lymphocytes Relative 38 %   Lymphs Abs 1.3 0.7 - 4.0 K/uL   Monocytes Relative 10 %   Monocytes Absolute 0.3 0.1 - 1.0 K/uL   Eosinophils Relative 9 %   Eosinophils Absolute 0.3 0.0 - 0.5 K/uL   Basophils Relative 1 %   Basophils Absolute 0.0 0.0 - 0.1 K/uL   Immature Granulocytes 0 %   Abs Immature Granulocytes 0.00 0.00 - 0.07 K/uL    RADIOGRAPHIC STUDIES:  I have personally reviewed the radiological images as listed and agree with the findings in the report.  MM 3D SCREENING MAMMOGRAM BILATERAL BREAST Result Date: 06/09/2023 CLINICAL DATA:  Screening. EXAM: DIGITAL SCREENING BILATERAL MAMMOGRAM WITH TOMOSYNTHESIS AND CAD TECHNIQUE: Bilateral screening digital craniocaudal and mediolateral oblique mammograms were obtained. Bilateral screening digital breast tomosynthesis was performed. The images were evaluated with computer-aided detection. COMPARISON:  Previous exam(s). ACR Breast Density  Category d: The breasts are extremely dense, which lowers the sensitivity of mammography. FINDINGS: There are no findings suspicious for malignancy. IMPRESSION: No mammographic evidence of malignancy. A result letter of this screening mammogram will  be mailed directly to the patient. RECOMMENDATION: Screening mammogram in one year. (Code:SM-B-01Y) BI-RADS CATEGORY  1: Negative. Electronically Signed   By: Edwin Cap M.D.   On: 06/09/2023 16:03    Orders Placed This Encounter  Procedures   CBC with Differential (Cancer Center Only)    Standing Status:   Future    Expected Date:   10/10/2023    Expiration Date:   06/09/2024   Iron and Iron Binding Capacity ([facility] only)    Standing Status:   Future    Expected Date:   10/10/2023    Expiration Date:   06/09/2024   Ferritin    Standing Status:   Future    Expected Date:   10/10/2023    Expiration Date:   06/09/2024   Vitamin B12    Standing Status:   Future    Expected Date:   10/10/2023    Expiration Date:   06/09/2024   Folate    Standing Status:   Future    Expected Date:   10/10/2023    Expiration Date:   06/09/2024     Future Appointments  Date Time Provider Department Center  08/26/2023  8:30 AM Claiborne Rigg, NP [facility] None  10/09/2023 10:00 AM [facility] None  10/09/2023 10:30 AM Renate Danh, Archie Patten, MD [facility] None    This document was completed utilizing speech recognition software. Grammatical errors, random word insertions, pronoun errors, and incomplete sentences are an occasional consequence of this system due to software limitations, ambient noise, and hardware issues. Any formal questions or concerns about the content, text or information contained within the body of this dictation should be directly addressed to the provider for clarification.

## 2023-06-10 NOTE — Assessment & Plan Note (Signed)
 She was noted to have microcytic anemia. History of anemia with prior iron and blood transfusions. No active bleeding.   On her initial consultation with [place] on 03/11/2023, labs showed persistent anemia with hemoglobin of 10.2, hematocrit 34.5, MCV 74.8.  White count and platelet count were within normal limits.  CMP grossly unremarkable.  Ferritin decreased at 7, iron saturation decreased at 5%, iron decreased at 25, indicative of iron deficiency state.  B12, folate, LDH were all within normal limits.   She was treated with IV iron using Feraheme x 2 doses.   Colonoscopy recommended as last one was over five years ago.   Repeat labs on 06/10/2023 showed much improved hemoglobin of 13, MCV normal at 85.  Iron studies showed no evidence of iron deficiency currently.  She was advised to resume taking oral iron supplements once daily with vitamin C.  RTC in 4 months for follow-up with repeat labs.

## 2023-06-10 NOTE — Assessment & Plan Note (Signed)
 White blood cell count is slightly low at 3,400 cells/mcL, below the normal range of 4,000 cells/mcL. She had a flu infection about a month and a half ago but currently has no infection symptoms.  ANC is normal at 1500 - Monitor white blood cell count in follow-up visits. - Educate on signs of infection to watch for, given the low white blood cell count.

## 2023-06-16 DIAGNOSIS — H401133 Primary open-angle glaucoma, bilateral, severe stage: Secondary | ICD-10-CM | POA: Diagnosis not present

## 2023-07-20 ENCOUNTER — Telehealth: Payer: Self-pay | Admitting: Nurse Practitioner

## 2023-07-20 NOTE — Telephone Encounter (Signed)
 Unless there is blood in stool, fever or vomiting and dehydrated, Gastroenteritis or Norovirus can typically last up to 7 days. I would not advise taking imodium when the body is trying to rid itself of toxins. If she is experiencing any of the severe symptoms above and it occurs again she would need to be evaluated in person at urgent care or hospital for possible IVFs. If it's just diarrhea then we would allow to 7 days for symptoms to resolve and she would be instructed to push fluids, broth, clears until it resolves.

## 2023-07-20 NOTE — Telephone Encounter (Signed)
 PT came in this afternoon having stomach pain and diarrhea since last Thursday she said she feel ok today was wondering what can she take if it happens again I tried to sch appt but she said she was ok.

## 2023-07-21 NOTE — Telephone Encounter (Signed)
 Unable to reach patient by phone to relay response. Please refer to previous message if patient calls back.

## 2023-07-21 NOTE — Telephone Encounter (Signed)
 Copied from CRM [id number]. Topic: Clinical - Medical Advice  >> Jul 21, 2023  1:47 PM Rosaria Common wrote: Reason for CRM: Pt returning the phone call of CMA [place]. Callback number is [phone].

## 2023-07-21 NOTE — Telephone Encounter (Signed)
 Call unanswered by patient.  Voicemail left for patient to return call.  Please relay Alyssa Peterson response to patient when she calls back.

## 2023-07-23 ENCOUNTER — Other Ambulatory Visit: Payer: Self-pay | Admitting: Surgery

## 2023-07-23 DIAGNOSIS — R1310 Dysphagia, unspecified: Secondary | ICD-10-CM | POA: Diagnosis not present

## 2023-07-23 DIAGNOSIS — K432 Incisional hernia without obstruction or gangrene: Secondary | ICD-10-CM | POA: Diagnosis not present

## 2023-07-23 DIAGNOSIS — Z8719 Personal history of other diseases of the digestive system: Secondary | ICD-10-CM | POA: Diagnosis not present

## 2023-07-23 DIAGNOSIS — K219 Gastro-esophageal reflux disease without esophagitis: Secondary | ICD-10-CM | POA: Diagnosis not present

## 2023-08-07 ENCOUNTER — Ambulatory Visit
Admission: RE | Admit: 2023-08-07 | Discharge: 2023-08-07 | Disposition: A | Discharge status: Home / Self Care | Source: Ambulatory Visit | Attending: Surgery | Admitting: Surgery

## 2023-08-07 ENCOUNTER — Encounter: Payer: Self-pay | Admitting: Surgery

## 2023-08-07 DIAGNOSIS — R1310 Dysphagia, unspecified: Secondary | ICD-10-CM

## 2023-08-07 DIAGNOSIS — J90 Pleural effusion, not elsewhere classified: Secondary | ICD-10-CM | POA: Diagnosis not present

## 2023-08-07 DIAGNOSIS — K219 Gastro-esophageal reflux disease without esophagitis: Secondary | ICD-10-CM | POA: Diagnosis not present

## 2023-08-07 DIAGNOSIS — K432 Incisional hernia without obstruction or gangrene: Secondary | ICD-10-CM | POA: Diagnosis not present

## 2023-08-07 DIAGNOSIS — Z86711 Personal history of pulmonary embolism: Secondary | ICD-10-CM | POA: Diagnosis not present

## 2023-08-07 DIAGNOSIS — I10 Essential (primary) hypertension: Secondary | ICD-10-CM | POA: Diagnosis not present

## 2023-08-07 DIAGNOSIS — D509 Iron deficiency anemia, unspecified: Secondary | ICD-10-CM | POA: Diagnosis not present

## 2023-08-07 DIAGNOSIS — Z8719 Personal history of other diseases of the digestive system: Secondary | ICD-10-CM

## 2023-08-07 MED ORDER — IOPAMIDOL (ISOVUE-300) INJECTION 61%
75.0000 mL | Freq: Once | INTRAVENOUS | Status: AC | PRN
  Administered 2023-08-07: 75 mL via INTRAVENOUS

## 2023-08-08 ENCOUNTER — Ambulatory Visit (HOSPITAL_COMMUNITY)
Admission: EM | Admit: 2023-08-08 | Discharge: 2023-08-08 | Disposition: A | Discharge status: Home / Self Care | Attending: Family Medicine | Admitting: Family Medicine

## 2023-08-08 ENCOUNTER — Encounter (HOSPITAL_COMMUNITY): Payer: Self-pay

## 2023-08-08 DIAGNOSIS — R001 Bradycardia, unspecified: Secondary | ICD-10-CM

## 2023-08-08 DIAGNOSIS — S161XXA Strain of muscle, fascia and tendon at neck level, initial encounter: Secondary | ICD-10-CM

## 2023-08-08 DIAGNOSIS — S39012A Strain of muscle, fascia and tendon of lower back, initial encounter: Secondary | ICD-10-CM | POA: Diagnosis not present

## 2023-08-08 MED ORDER — MELOXICAM 15 MG PO TABS: 15.0000 mg | ORAL_TABLET | Freq: Every day | ORAL | 0 refills | Status: DC

## 2023-08-08 MED ORDER — LIDOCAINE HCL 2 % IJ SOLN
INTRAMUSCULAR | Status: AC
  Filled 2023-08-08: qty 20

## 2023-08-08 MED ORDER — METHOCARBAMOL 500 MG PO TABS: 500.0000 mg | ORAL_TABLET | Freq: Two times a day (BID) | ORAL | 0 refills | Status: DC
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
## 2023-08-18 ENCOUNTER — Ambulatory Visit: Payer: Self-pay | Admitting: Surgery

## 2023-08-18 NOTE — Patient Instructions (Addendum)
 Ter SURGICAL WAITING ROOM VISITATION  Patients having surgery or a procedure may have no more than 2 support people in the waiting area - these visitors may rotate.    Children under the age of 44 must have an adult with them who is not the patient.  Due to an increase in RSV and influenza rates and associated hospitalizations, children ages 67 and under may not visit patients in [facility] hospitals.  Visitors with respiratory illnesses are discouraged from visiting and should remain at home.  If the patient needs to stay at the hospital during part of their recovery, the visitor guidelines for inpatient rooms apply. Pre-op nurse will coordinate an appropriate time for 1 support person to accompany patient in pre-op.  This support person may not rotate.    Please refer to the [facility] website for the visitor guidelines for Inpatients (after your surgery is over and you are in a regular room).       Your procedure is scheduled on:  08/21/2023    Report to [facility] Main Entrance    Report to admitting at   0930AM   Call this number if you have problems the morning of surgery [phone]   Do not eat food :After Midnight.   After Midnight you may have the following liquids until _ 0900_____ AM  DAY OF SURGERY  Water Non-Citrus Juices (without pulp, NO RED-Apple, White grape, White cranberry) Black Coffee (NO MILK/CREAM OR CREAMERS, sugar ok)  Clear Tea (NO MILK/CREAM OR CREAMERS, sugar ok) regular and decaf                             Plain Jell-O (NO RED)                                           Fruit ices (not with fruit pulp, NO RED)                                     Popsicles (NO RED)                                                               Sports drinks like Gatorade (NO RED)                   The day of surgery:  Drink ONE (1) Pre-Surgery Clear Ensure or G2 at   0900 AM the morning of surgery. Drink in one sitting. Do not sip.  This drink was  given to you during your hospital  pre-op appointment visit. Nothing else to drink after completing the  Pre-Surgery Clear Ensure or G2.          If you have questions, please contact your surgeon's office.   FOLLOW BOWEL PREP AND ANY ADDITIONAL PRE OP INSTRUCTIONS YOU RECEIVED FROM YOUR SURGEON'S OFFICE!!!     Oral Hygiene is also important to reduce your risk of infection.  Remember - BRUSH YOUR TEETH THE MORNING OF SURGERY WITH YOUR REGULAR TOOTHPASTE  DENTURES WILL BE REMOVED PRIOR TO SURGERY PLEASE DO NOT APPLY "Poly grip" OR ADHESIVES!!!   Do NOT smoke after Midnight   Stop all vitamins and herbal supplements 7 days before surgery.   Take these medicines the morning of surgery with A SIP OF WATER:  none   DO NOT TAKE ANY ORAL DIABETIC MEDICATIONS DAY OF YOUR SURGERY  Bring CPAP mask and tubing day of surgery.                              You may not have any metal on your body including hair pins, jewelry, and body piercing             Do not wear make-up, lotions, powders, perfumes/cologne, or deodorant  Do not wear nail polish including gel and S&S, artificial/acrylic nails, or any other type of covering on natural nails including finger and toenails. If you have artificial nails, gel coating, etc. that needs to be removed by a nail salon please have this removed prior to surgery or surgery may need to be canceled/ delayed if the surgeon/ anesthesia feels like they are unable to be safely monitored.   Do not shave  48 hours prior to surgery.               Men may shave face and neck.   Do not bring valuables to the hospital. [facility] IS NOT             RESPONSIBLE   FOR VALUABLES.   Contacts, glasses, dentures or bridgework may not be worn into surgery.   Bring small overnight bag day of surgery.   DO NOT BRING YOUR HOME MEDICATIONS TO THE HOSPITAL. PHARMACY WILL DISPENSE MEDICATIONS LISTED ON YOUR MEDICATION LIST TO YOU DURING  YOUR ADMISSION IN THE HOSPITAL!    Patients discharged on the day of surgery will not be allowed to drive home.  Someone NEEDS to stay with you for the first 24 hours after anesthesia.   Special Instructions: Bring a copy of your healthcare power of attorney and living will documents the day of surgery if you haven't scanned them before.              Please read over the following fact sheets you were given: IF YOU HAVE QUESTIONS ABOUT YOUR PRE-OP INSTRUCTIONS PLEASE CALL [phone]   If you received a COVID test during your pre-op visit  it is requested that you wear a mask when out in public, stay away from anyone that may not be feeling well and notify your surgeon if you develop symptoms. If you test positive for Covid or have been in contact with anyone that has tested positive in the last 10 days please notify you surgeon.    [place] - Preparing for Surgery Before surgery, you can play an important role.  Because skin is not sterile, your skin needs to be as free of germs as possible.  You can reduce the number of germs on your skin by washing with CHG (chlorahexidine gluconate) soap before surgery.  CHG is an antiseptic cleaner which kills germs and bonds with the skin to continue killing germs even after washing. Please DO NOT use if you have an allergy to CHG or antibacterial soaps.  If your skin becomes reddened/irritated stop using the CHG and inform your nurse when you  arrive at Short Stay. Do not shave (including legs and underarms) for at least 48 hours prior to the first CHG shower.  You may shave your face/neck. Please follow these instructions carefully:  1.  Shower with CHG Soap the night before surgery and the  morning of Surgery.  2.  If you choose to wash your hair, wash your hair first as usual with your  normal  shampoo.  3.  After you shampoo, rinse your hair and body thoroughly to remove the  shampoo.                           4.  Use CHG as you would any other liquid  soap.  You can apply chg directly  to the skin and wash                       Gently with a scrungie or clean washcloth.  5.  Apply the CHG Soap to your body ONLY FROM THE NECK DOWN.   Do not use on face/ open                           Wound or open sores. Avoid contact with eyes, ears mouth and genitals (private parts).                       Wash face,  Genitals (private parts) with your normal soap.             6.  Wash thoroughly, paying special attention to the area where your surgery  will be performed.  7.  Thoroughly rinse your body with warm water from the neck down.  8.  DO NOT shower/wash with your normal soap after using and rinsing off  the CHG Soap.                9.  Pat yourself dry with a clean towel.            10.  Wear clean pajamas.            11.  Place clean sheets on your bed the night of your first shower and do not  sleep with pets. Day of Surgery : Do not apply any lotions/deodorants the morning of surgery.  Please wear clean clothes to the hospital/surgery center.  FAILURE TO FOLLOW THESE INSTRUCTIONS MAY RESULT IN THE CANCELLATION OF YOUR SURGERY PATIENT SIGNATURE_________________________________  NURSE SIGNATURE__________________________________  ________________________________________________________________________

## 2023-08-18 NOTE — Progress Notes (Signed)
 Surgery orders requested via Epic inbox.

## 2023-08-18 NOTE — Progress Notes (Signed)
 Anesthesia Review:  PCP: Zelda FlemingLOV 05/26/23  Cardiologist :  PPM/ ICD: Device Orders: Rep Notified:  Chest x-ray : CT Chest- 08/07/23  EKG : 08/08/23  Echo : Stress test: Cardiac Cath :   Activity level:  Sleep Study/ CPAP : Fasting Blood Sugar :      / Checks Blood Sugar -- times a day:    Blood Thinner/ Instructions /Last Dose: ASA / Instructions/ Last Dose :    05/02/22- ivc filter insertion   08/08/23- Urg Care- neck strain    Called pt on 08/19/23 in error in regards to preop appt.  Left messages x 2 Called pt back on 3rd message and left message that preop nurse had called her in error and would call her on 08/20/23 at 3pm.

## 2023-08-20 ENCOUNTER — Other Ambulatory Visit: Payer: Self-pay

## 2023-08-20 ENCOUNTER — Encounter (HOSPITAL_COMMUNITY)
Admission: RE | Admit: 2023-08-20 | Discharge: 2023-08-20 | Disposition: A | Discharge status: Home / Self Care | Source: Ambulatory Visit | Attending: Surgery | Admitting: Surgery

## 2023-08-20 ENCOUNTER — Encounter (HOSPITAL_COMMUNITY): Payer: Self-pay

## 2023-08-20 DIAGNOSIS — Z01818 Encounter for other preprocedural examination: Secondary | ICD-10-CM
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
## 2023-08-20 NOTE — Anesthesia Preprocedure Evaluation (Signed)
 Anesthesia Evaluation    Airway        Dental   Pulmonary           Cardiovascular hypertension,      Neuro/Psych    GI/Hepatic   Endo/Other    Renal/GU      Musculoskeletal   Abdominal   Peds  Hematology   Anesthesia Other Findings   Reproductive/Obstetrics                              Anesthesia Physical Anesthesia Plan  ASA:   Anesthesia Plan:    Post-op Pain Management:    Induction:   PONV Risk Score and Plan:   Airway Management Planned:   Additional Equipment:   Intra-op Plan:   Post-operative Plan:   Informed Consent:   Plan Discussed with:   Anesthesia Plan Comments: (See PAT note from 5/29)         Anesthesia Quick Evaluation

## 2023-08-21 ENCOUNTER — Encounter (HOSPITAL_COMMUNITY)
Admission: RE | Disposition: A | Discharge status: Home / Self Care | Payer: Self-pay | Source: Home / Self Care | Attending: Surgery

## 2023-08-21 ENCOUNTER — Inpatient Hospital Stay (HOSPITAL_COMMUNITY): Admitting: Anesthesiology

## 2023-08-21 ENCOUNTER — Inpatient Hospital Stay (HOSPITAL_BASED_OUTPATIENT_CLINIC_OR_DEPARTMENT_OTHER): Admitting: Anesthesiology

## 2023-08-21 ENCOUNTER — Other Ambulatory Visit: Payer: Self-pay

## 2023-08-21 ENCOUNTER — Observation Stay (HOSPITAL_COMMUNITY)
Admission: RE | Admit: 2023-08-21 | Discharge: 2023-08-22 | Disposition: A | Discharge status: Home / Self Care | Attending: Surgery | Admitting: Surgery

## 2023-08-21 ENCOUNTER — Encounter (HOSPITAL_COMMUNITY): Payer: Self-pay | Admitting: Surgery

## 2023-08-21 DIAGNOSIS — K439 Ventral hernia without obstruction or gangrene: Principal | ICD-10-CM | POA: Diagnosis present

## 2023-08-21 DIAGNOSIS — R1310 Dysphagia, unspecified: Secondary | ICD-10-CM

## 2023-08-21 DIAGNOSIS — K430 Incisional hernia with obstruction, without gangrene: Secondary | ICD-10-CM | POA: Diagnosis not present

## 2023-08-21 DIAGNOSIS — Z79899 Other long term (current) drug therapy: Secondary | ICD-10-CM | POA: Insufficient documentation

## 2023-08-21 DIAGNOSIS — Z91040 Latex allergy status: Secondary | ICD-10-CM | POA: Insufficient documentation

## 2023-08-21 DIAGNOSIS — K219 Gastro-esophageal reflux disease without esophagitis: Secondary | ICD-10-CM | POA: Diagnosis not present

## 2023-08-21 DIAGNOSIS — Z86711 Personal history of pulmonary embolism: Secondary | ICD-10-CM | POA: Diagnosis not present

## 2023-08-21 DIAGNOSIS — Z01818 Encounter for other preprocedural examination: Secondary | ICD-10-CM

## 2023-08-21 DIAGNOSIS — K432 Incisional hernia without obstruction or gangrene: Secondary | ICD-10-CM | POA: Diagnosis not present

## 2023-08-21 DIAGNOSIS — I10 Essential (primary) hypertension: Secondary | ICD-10-CM | POA: Diagnosis not present

## 2023-08-21 HISTORY — PX: INCISIONAL HERNIA REPAIR: SHX193

## 2023-08-21 LAB — BASIC METABOLIC PANEL WITH GFR
Anion gap: 7 (ref 5–15)
BUN: 18 mg/dL (ref 8–23)
CO2: 24 mmol/L (ref 22–32)
Calcium: 9.5 mg/dL (ref 8.9–10.3)
Chloride: 105 mmol/L (ref 98–111)
Creatinine, Ser: 0.64 mg/dL (ref 0.44–1.00)
GFR, Estimated: >60 mL/min (ref >60)
Glucose, Bld: 105 mg/dL — ABNORMAL HIGH (ref 70–99)
Potassium: 3.9 mmol/L (ref 3.5–5.1)
Sodium: 136 mmol/L (ref 135–145)

## 2023-08-21 LAB — CBC
HCT: 41.2 % (ref 36.0–46.0)
Hemoglobin: 12.9 g/dL (ref 12.0–15.0)
MCH: 29.6 pg (ref 26.0–34.0)
MCHC: 31.3 g/dL (ref 30.0–36.0)
MCV: 94.5 fL (ref 80.0–100.0)
Platelets: 184 10*3/uL (ref 150–400)
RBC: 4.36 MIL/uL (ref 3.87–5.11)
RDW: 12.6 % (ref 11.5–15.5)
WBC: 3.2 10*3/uL — ABNORMAL LOW (ref 4.0–10.5)
nRBC: 0 % (ref 0.0–0.2)

## 2023-08-21 SURGERY — REPAIR, HERNIA, INCISIONAL
Anesthesia: General

## 2023-08-21 MED ORDER — DEXAMETHASONE SODIUM PHOSPHATE 10 MG/ML IJ SOLN
INTRAMUSCULAR | Status: AC
  Filled 2023-08-21: qty 1

## 2023-08-21 MED ORDER — TIMOLOL MALEATE 0.5 % OP SOLN
1.0000 [drp] | Freq: Two times a day (BID) | OPHTHALMIC | Status: DC
  Administered 2023-08-21: 1 [drp] via OPHTHALMIC
  Filled 2023-08-21: qty 5

## 2023-08-21 MED ORDER — CEFAZOLIN SODIUM-DEXTROSE 2-4 GM/100ML-% IV SOLN
2.0000 g | INTRAVENOUS | Status: AC
  Administered 2023-08-21: 2 g via INTRAVENOUS
  Filled 2023-08-21: qty 100

## 2023-08-21 MED ORDER — FENTANYL CITRATE (PF) 250 MCG/5ML IJ SOLN
INTRAMUSCULAR | Status: AC
Start: 2023-08-21 — End: ?
  Filled 2023-08-21: qty 5

## 2023-08-21 MED ORDER — METHOCARBAMOL 500 MG PO TABS: 500.0000 mg | ORAL_TABLET | Freq: Four times a day (QID) | ORAL | 0 refills | Status: DC

## 2023-08-21 MED ORDER — OXYCODONE-ACETAMINOPHEN 5-325 MG PO TABS: 1.0000 | ORAL_TABLET | ORAL | 0 refills | Status: DC | PRN

## 2023-08-21 MED ORDER — CHLORHEXIDINE GLUCONATE CLOTH 2 % EX PADS: 6.0000 | MEDICATED_PAD | Freq: Once | CUTANEOUS | Status: DC

## 2023-08-21 MED ORDER — BUPIVACAINE-EPINEPHRINE (PF) 0.25% -1:200000 IJ SOLN
INTRAMUSCULAR | Status: AC
  Filled 2023-08-21: qty 30

## 2023-08-21 MED ORDER — AMLODIPINE BESYLATE 10 MG PO TABS
10.0000 mg | ORAL_TABLET | Freq: Every day | ORAL | Status: DC
  Administered 2023-08-21: 10 mg via ORAL
  Filled 2023-08-21 (×2): qty 1

## 2023-08-21 MED ORDER — DEXAMETHASONE SODIUM PHOSPHATE 10 MG/ML IJ SOLN
INTRAMUSCULAR | Status: DC | PRN
  Administered 2023-08-21: 5 mg via INTRAVENOUS

## 2023-08-21 MED ORDER — PROCHLORPERAZINE EDISYLATE 10 MG/2ML IJ SOLN: 10.0000 mg | INTRAMUSCULAR | Status: DC | PRN

## 2023-08-21 MED ORDER — ORAL CARE MOUTH RINSE: 15.0000 mL | Freq: Once | OROMUCOSAL | Status: AC

## 2023-08-21 MED ORDER — OXYCODONE HCL 5 MG PO TABS: 10.0000 mg | ORAL_TABLET | ORAL | Status: DC | PRN

## 2023-08-21 MED ORDER — ONDANSETRON HCL 4 MG/2ML IJ SOLN
INTRAMUSCULAR | Status: DC | PRN
  Administered 2023-08-21: 4 mg via INTRAVENOUS

## 2023-08-21 MED ORDER — LIDOCAINE HCL 2 % IJ SOLN
INTRAMUSCULAR | Status: AC
  Filled 2023-08-21: qty 20

## 2023-08-21 MED ORDER — 0.9 % SODIUM CHLORIDE (POUR BTL) OPTIME
TOPICAL | Status: DC | PRN
Start: 2023-08-21 — End: 2023-08-21
  Administered 2023-08-21: 1000 mL

## 2023-08-21 MED ORDER — KETAMINE HCL 50 MG/5ML IJ SOSY
PREFILLED_SYRINGE | INTRAMUSCULAR | Status: AC
  Filled 2023-08-21: qty 5

## 2023-08-21 MED ORDER — BRIMONIDINE TARTRATE-TIMOLOL 0.2-0.5 % OP SOLN
1.0000 [drp] | Freq: Two times a day (BID) | OPHTHALMIC | Status: DC
  Filled 2023-08-21: qty 5

## 2023-08-21 MED ORDER — LATANOPROST 0.005 % OP SOLN
1.0000 [drp] | Freq: Every day | OPHTHALMIC | Status: DC
  Administered 2023-08-21: 1 [drp] via OPHTHALMIC
  Filled 2023-08-21: qty 2.5

## 2023-08-21 MED ORDER — ACETAMINOPHEN 500 MG PO TABS
1000.0000 mg | ORAL_TABLET | Freq: Once | ORAL | Status: AC
  Administered 2023-08-21: 1000 mg via ORAL
  Filled 2023-08-21: qty 2

## 2023-08-21 MED ORDER — ACETAMINOPHEN 500 MG PO TABS: 1000.0000 mg | ORAL_TABLET | ORAL | Status: DC

## 2023-08-21 MED ORDER — MIDAZOLAM HCL 2 MG/2ML IJ SOLN
INTRAMUSCULAR | Status: AC
Start: 2023-08-21 — End: ?
  Filled 2023-08-21: qty 2

## 2023-08-21 MED ORDER — ACETAMINOPHEN 325 MG PO TABS
650.0000 mg | ORAL_TABLET | Freq: Four times a day (QID) | ORAL | Status: DC
  Administered 2023-08-21 – 2023-08-22 (×3): 650 mg via ORAL
  Filled 2023-08-21 (×3): qty 2

## 2023-08-21 MED ORDER — ENOXAPARIN SODIUM 40 MG/0.4ML IJ SOSY
40.0000 mg | PREFILLED_SYRINGE | INTRAMUSCULAR | Status: DC
  Administered 2023-08-22: 40 mg via SUBCUTANEOUS
  Filled 2023-08-21: qty 0.4

## 2023-08-21 MED ORDER — PHENYLEPHRINE HCL-NACL 20-0.9 MG/250ML-% IV SOLN
INTRAVENOUS | Status: DC | PRN
  Administered 2023-08-21: 50 ug/min via INTRAVENOUS

## 2023-08-21 MED ORDER — MIDAZOLAM HCL 2 MG/2ML IJ SOLN
INTRAMUSCULAR | Status: DC | PRN
  Administered 2023-08-21: 1 mg via INTRAVENOUS

## 2023-08-21 MED ORDER — HYDROMORPHONE HCL 1 MG/ML IJ SOLN: 0.5000 mg | INTRAMUSCULAR | Status: DC | PRN

## 2023-08-21 MED ORDER — BUPIVACAINE LIPOSOME 1.3 % IJ SUSP
INTRAMUSCULAR | Status: AC
  Filled 2023-08-21: qty 20

## 2023-08-21 MED ORDER — OXYCODONE HCL 5 MG PO TABS: 5.0000 mg | ORAL_TABLET | ORAL | Status: DC | PRN

## 2023-08-21 MED ORDER — GABAPENTIN 300 MG PO CAPS
300.0000 mg | ORAL_CAPSULE | ORAL | Status: AC
  Administered 2023-08-21: 300 mg via ORAL
  Filled 2023-08-21: qty 1

## 2023-08-21 MED ORDER — ONDANSETRON HCL 4 MG/2ML IJ SOLN
INTRAMUSCULAR | Status: AC
  Filled 2023-08-21: qty 2

## 2023-08-21 MED ORDER — BUPIVACAINE LIPOSOME 1.3 % IJ SUSP
INTRAMUSCULAR | Status: DC | PRN
  Administered 2023-08-21: 20 mL

## 2023-08-21 MED ORDER — FENTANYL CITRATE (PF) 250 MCG/5ML IJ SOLN
INTRAMUSCULAR | Status: DC | PRN
  Administered 2023-08-21 (×2): 50 ug via INTRAVENOUS
  Administered 2023-08-21: 100 ug via INTRAVENOUS

## 2023-08-21 MED ORDER — PROPOFOL 10 MG/ML IV BOLUS
INTRAVENOUS | Status: AC
  Filled 2023-08-21: qty 20

## 2023-08-21 MED ORDER — LACTATED RINGERS IV SOLN: INTRAVENOUS | Status: DC

## 2023-08-21 MED ORDER — SODIUM CHLORIDE (PF) 0.9 % IJ SOLN
INTRAMUSCULAR | Status: AC
Start: 2023-08-21 — End: ?
  Filled 2023-08-21: qty 20

## 2023-08-21 MED ORDER — BRIMONIDINE TARTRATE 0.2 % OP SOLN
1.0000 [drp] | Freq: Two times a day (BID) | OPHTHALMIC | Status: DC
  Administered 2023-08-21 – 2023-08-22 (×2): 1 [drp] via OPHTHALMIC
  Filled 2023-08-21: qty 5

## 2023-08-21 MED ORDER — ROCURONIUM BROMIDE 10 MG/ML (PF) SYRINGE
PREFILLED_SYRINGE | INTRAVENOUS | Status: AC
  Filled 2023-08-21: qty 10

## 2023-08-21 MED ORDER — PROPOFOL 10 MG/ML IV BOLUS
INTRAVENOUS | Status: DC | PRN
  Administered 2023-08-21: 80 mg via INTRAVENOUS

## 2023-08-21 MED ORDER — LIDOCAINE HCL (CARDIAC) PF 100 MG/5ML IV SOSY
PREFILLED_SYRINGE | INTRAVENOUS | Status: DC | PRN
  Administered 2023-08-21: 60 mg via INTRAVENOUS

## 2023-08-21 MED ORDER — DOCUSATE SODIUM 100 MG PO CAPS
100.0000 mg | ORAL_CAPSULE | Freq: Two times a day (BID) | ORAL | Status: DC
  Administered 2023-08-21 – 2023-08-22 (×2): 100 mg via ORAL
  Filled 2023-08-21 (×2): qty 1

## 2023-08-21 MED ORDER — HEPARIN SODIUM (PORCINE) 5000 UNIT/ML IJ SOLN
5000.0000 [IU] | Freq: Once | INTRAMUSCULAR | Status: AC
  Administered 2023-08-21: 5000 [IU] via SUBCUTANEOUS
  Filled 2023-08-21: qty 1

## 2023-08-21 MED ORDER — SIMETHICONE 80 MG PO CHEW: 80.0000 mg | CHEWABLE_TABLET | Freq: Four times a day (QID) | ORAL | Status: DC | PRN

## 2023-08-21 MED ORDER — LIDOCAINE HCL (PF) 2 % IJ SOLN
INTRAMUSCULAR | Status: AC
  Filled 2023-08-21: qty 5

## 2023-08-21 MED ORDER — HYDROMORPHONE HCL 1 MG/ML IJ SOLN: 0.2500 mg | INTRAMUSCULAR | Status: DC | PRN

## 2023-08-21 MED ORDER — CHLORHEXIDINE GLUCONATE 0.12 % MT SOLN
15.0000 mL | Freq: Once | OROMUCOSAL | Status: AC
  Administered 2023-08-21: 15 mL via OROMUCOSAL

## 2023-08-21 MED ORDER — DROPERIDOL 2.5 MG/ML IJ SOLN: 0.6250 mg | Freq: Once | INTRAMUSCULAR | Status: DC | PRN

## 2023-08-21 MED ORDER — ONDANSETRON HCL 4 MG/2ML IJ SOLN: 4.0000 mg | Freq: Four times a day (QID) | INTRAMUSCULAR | Status: DC | PRN

## 2023-08-21 MED ORDER — BUPIVACAINE-EPINEPHRINE (PF) 0.25% -1:200000 IJ SOLN
INTRAMUSCULAR | Status: DC | PRN
  Administered 2023-08-21: 30 mL

## 2023-08-21 MED ORDER — SUGAMMADEX SODIUM 200 MG/2ML IV SOLN
INTRAVENOUS | Status: DC | PRN
  Administered 2023-08-21: 100 mg via INTRAVENOUS

## 2023-08-21 MED ORDER — KETAMINE HCL 50 MG/5ML IJ SOSY
PREFILLED_SYRINGE | INTRAMUSCULAR | Status: DC | PRN
  Administered 2023-08-21: 10 mg via INTRAVENOUS
  Administered 2023-08-21: 20 mg via INTRAVENOUS
  Administered 2023-08-21: 10 mg via INTRAVENOUS

## 2023-08-21 MED ORDER — ROCURONIUM BROMIDE 10 MG/ML (PF) SYRINGE
PREFILLED_SYRINGE | INTRAVENOUS | Status: DC | PRN
  Administered 2023-08-21: 20 mg via INTRAVENOUS
  Administered 2023-08-21: 40 mg via INTRAVENOUS

## 2023-08-21 SURGICAL SUPPLY — 38 items
BAG COUNTER SPONGE SURGICOUNT (BAG) ×1 IMPLANT
BINDER ABDOMINAL 12 ML 46-62 (SOFTGOODS) IMPLANT
CHLORAPREP W/TINT 26 (MISCELLANEOUS) ×1 IMPLANT
COVER SURGICAL LIGHT HANDLE (MISCELLANEOUS) ×1 IMPLANT
DERMABOND ADVANCED .7 DNX12 (GAUZE/BANDAGES/DRESSINGS) ×1 IMPLANT
DRAIN CHANNEL 19F RND (DRAIN) IMPLANT
DRAPE LAPAROSCOPIC ABDOMINAL (DRAPES) ×1 IMPLANT
DRAPE WARM FLUID 44X44 (DRAPES) ×1 IMPLANT
ELECT BLADE TIP CTD 4 INCH (ELECTRODE) ×1 IMPLANT
ELECT PENCIL ROCKER SW 15FT (MISCELLANEOUS) ×1 IMPLANT
ELECT REM PT RETURN 15FT ADLT (MISCELLANEOUS) ×1 IMPLANT
EVACUATOR SILICONE 100CC (DRAIN) IMPLANT
GAUZE 4X4 16PLY ~~LOC~~+RFID DBL (SPONGE) ×1 IMPLANT
GAUZE SPONGE 4X4 12PLY STRL (GAUZE/BANDAGES/DRESSINGS) ×1 IMPLANT
GLOVE BIO SURGEON STRL SZ7.5 (GLOVE) ×1 IMPLANT
GLOVE INDICATOR 8.0 STRL GRN (GLOVE) ×1 IMPLANT
GOWN STRL REUS W/ TWL XL LVL3 (GOWN DISPOSABLE) ×1 IMPLANT
KIT BASIN OR (CUSTOM PROCEDURE TRAY) ×1 IMPLANT
KIT TURNOVER KIT A (KITS) IMPLANT
MARKER SKIN DUAL TIP RULER LAB (MISCELLANEOUS) ×1 IMPLANT
MESH SOFT 12X12IN BARD (Mesh General) IMPLANT
MESH VICRYL KNITTED 12X12 (Mesh General) IMPLANT
NDL HYPO 22X1.5 SAFETY MO (MISCELLANEOUS) IMPLANT
NEEDLE HYPO 22X1.5 SAFETY MO (MISCELLANEOUS) ×1 IMPLANT
PACK GENERAL/GYN (CUSTOM PROCEDURE TRAY) ×1 IMPLANT
SPONGE DRAIN TRACH 4X4 STRL 2S (GAUZE/BANDAGES/DRESSINGS) IMPLANT
SUT ETHILON 2 0 PS N (SUTURE) IMPLANT
SUT MNCRL AB 4-0 PS2 18 (SUTURE) ×1 IMPLANT
SUT PDS AB 0 CT 36 (SUTURE) ×2 IMPLANT
SUT VIC AB 3-0 SH 8-18 (SUTURE) ×1 IMPLANT
SUT VICRYL 2 0 18 UND BR (SUTURE) ×2 IMPLANT
SUT VICRYL 2-0 SH 8X27 (SUTURE) ×1 IMPLANT
SUT VICRYL 3-0 CR8 SH (SUTURE) IMPLANT
SUT VICRYL AB 2 0 TIES (SUTURE) IMPLANT
SYR 30ML LL (SYRINGE) IMPLANT
TOWEL OR 17X26 10 PK STRL BLUE (TOWEL DISPOSABLE) ×1 IMPLANT
TOWEL ~~LOC~~+RFID 17X26 BLUE (SPONGE) ×1 IMPLANT
TRAY FOLEY MTR SLVR 16FR STAT (SET/KITS/TRAYS/PACK) IMPLANT

## 2023-08-21 NOTE — Op Note (Signed)
 Patient: Alyssa Peterson (May 23, 1950, [id number])  Date of Surgery: 08/21/2023  Preoperative Diagnosis: Incisional hernia 2.6 cm tall x 4.4 cm wide on preoperative CT  Postoperative Diagnosis: Incisional hernia 2.6 cm tall x 4.4 cm wide on preoperative CT  Surgical Procedure: Open incisional hernia repair with mesh Bilateral posterior rectus myofascial release  Operative Team Members:  Surgeons and Role:    * Plato Alspaugh, Avon Boers, MD - Primary   Anesthesiologist: Gorman Laughter, MD CRNA: Vella Gey, CRNA   Anesthesia: General   Fluids:  Total I/O In: 1500 [I.V.:1400; IV Piggyback:100] Out: 650 [Urine:600; Blood:50]  Complications: None  Drains:  None  Specimen: None  Disposition:  PACU - hemodynamically stable.  Plan of Care: Admit for overnight observation  Indications for Procedure: Alyssa Peterson is a 73 y.o. female who presented with a ventral hernia.  She also had a swirl sign on preoperative CT.  I recommended open incisional hernia repair with mesh, possible component separation, with abdominal exploration.  We discussed the procedure, its risks, benefits and alternatives and the patient granted consent to proceed.   Findings:  Hernia Location: Ventral hernia location: Umbilical (M3) Hernia Size:  2.6 cm tall x 4.4 cm wide on preoperative CT Mesh Size &Type:  27 cm tall x 15 cm wide Bard Soft Mesh Mesh Position: Sublay - Retromuscular Myofascial Releases: Bilateral posterior rectus myofascial release   Description of Procedure:  The patient was positioned supine on the operating room table, adequately padded and secured.  A timeout procedure was performed.  A midline incision was made and dissection was carried down through subcutaneous tissue. The prior scar was excised, completely. The abdomen was entered safely.  There were many abdominal wall adhesions and multiple band adhesions within the abdomen..  A meticulous and tedious sharp lysis of  adhesions was carried out.  This was completed with no injury to the viscera.  The small bowel was run from the ligament of treitz to to the terminal ileum and was completely free.  After the anterior abdominal wall was cleared off of all adhesions, a large safety towel was placed over the viscera to protect them.  The hernia defect was located in the umbilical region. The total hernia defect area was measured and the size was recorded above under findings.  A rectus myofascial release was performed on the LEFT side. The posterior rectus sheath was incised just lateral to the hernia edge.  This incision in the posterior rectus sheath was extended both cephalad and caudal to the hernia defect.  Dissection was carried out laterally in the retromuscular plane to the edge of the rectus sheath progressively disconnecting the rectus muscle from the underlying posterior rectus sheath. The segmental innervation comprised of intercostal nerve branches 8, 9, 10, 11 and 12 were individually identified and preserved.  The arterial blood supply to the rectus muscle comprised of the superior and inferior epigastric arteries along with the small terminal branches from the posterior intercostal arteries 10, 11 and 12, the subcostal artery, the posterior lumbar arteries, and the deep circumflex artery were all identified and individually preserved.  The rectus myofascial release accomplished medialization of the posterior rectus sheath towards the midline and disinsertion of the rectus muscle from its surrounding fascia, and thus its encasement in the rectus sheath, allowing for widening of the rectus muscle and transfer of the rectus flap towards the midline.  This will allow for future inset of the medial aspect of the flap for abdominal wall  reconstruction.  A rectus myofascial release was performed on the RIGHT side. The posterior rectus sheath was incised just lateral to the hernia edge.  This incision in the posterior  rectus sheath was extended both cephalad and caudal to the hernia defect.  Dissection was carried out laterally in the retromuscular plane to the edge of the rectus sheath progressively disconnecting the rectus muscle from the underlying posterior rectus sheath. The segmental innervation comprised of intercostal nerve branches 8, 9, 10, 11 and 12 were individually identified and preserved.  The arterial blood supply to the rectus muscle comprised of the superior and inferior epigastric arteries along with the small terminal branches from the posterior intercostal arteries 10, 11 and 12, the subcostal artery, the posterior lumbar arteries, and the deep circumflex artery were all identified and individually preserved.  The rectus myofascial release accomplished medialization of the posterior rectus sheath towards the midline and disinsertion of the rectus muscle from its surrounding fascia, and thus its encasement in the rectus sheath, allowing for widening of the rectus muscle and transfer of the rectus flap towards the midline.  This will allow for future inset of the medial aspect of the flap for abdominal wall reconstruction.  The towel was removed and the posterior fascia was reapproximated in the midline with a continuous 2-0 vicryl suture. This was unable to fully reconstruct the posterior layer so a piece of vicryl mesh was sewn in as patch to allow posterior layer closure without tension.  The retromuscular plane was copiously irrigated with warm normal saline. There was good hemostasis of the operative field.   A transversus abdominis plane (TAP) block was performed bilaterally with Exparel .  The anesthetic was injected into the plane between the transversus abdominis and internal abdominal oblique muscle bilaterally.  The mesh listed above was brought into the operative field and cut to the size specified above.  The mesh was deployed into the retromuscular position where it conformed well to the  space.  It did not require any fixation.  The mesh space was irrigated with saline. The rectus muscles were re approximated in the midline utilizing a continuous 0 PDS suture taking 5 mm bites of the fascia with 5 mm advancement. The fascia came together well. The subcutaneous tissue was irrigated with saline. Scarpa's fascia was reapproximated with interrupted 2-0 Vicryl suture.  The subcuticular tissue was reapproximated with buried, interrupted 2-0 Vicryl suture.  The skin was closed with a 4-0 Monocryl subcuticular suture and skin glue.  All sponge and needle counts were correct at the end of this case.   Teddie Favre, MD General, Bariatric, & Minimally Invasive Surgery [facility], [place]

## 2023-08-21 NOTE — Discharge Instructions (Signed)

## 2023-08-21 NOTE — Plan of Care (Signed)

## 2023-08-21 NOTE — Anesthesia Procedure Notes (Signed)
 Procedure Name: Intubation Date/Time: 08/21/2023 12:54 PM  Performed by: Vella Gey, CRNAPre-anesthesia Checklist: Patient identified, Emergency Drugs available, Suction available and Patient being monitored Patient Re-evaluated:Patient Re-evaluated prior to induction Oxygen Delivery Method: Circle system utilized Preoxygenation: Pre-oxygenation with 100% oxygen Induction Type: IV induction Ventilation: Mask ventilation without difficulty and Oral airway inserted - appropriate to patient size Laryngoscope Size: 3 and Miller Grade View: Grade I Tube type: Oral Tube size: 7.0 mm Number of attempts: 1 Airway Equipment and Method: Stylet Placement Confirmation: ETT inserted through vocal cords under direct vision, positive ETCO2 and breath sounds checked- equal and bilateral Secured at: 22 cm Tube secured with: Tape Dental Injury: Teeth and Oropharynx as per pre-operative assessment

## 2023-08-21 NOTE — H&P (Signed)
 Admitting Physician: Avon Boers Triston Skare  Service: General Surgery  CC: hernia  Subjective   HPI: Alyssa Peterson is an 72 y.o. female who is here for hernia repair  Past Medical History:  Diagnosis Date   Anemia    Hx   Arthritis    Diaphragmatic hernia with obstruction    Gastric volvulus    GERD (gastroesophageal reflux disease)    Glaucoma    History of hiatal hernia    Hypertension    Inguinal hernia    left   Pulmonary embolism (HCC)     Past Surgical History:  Procedure Laterality Date   COLONOSCOPY     CYST REMOVAL NECK     DENTAL SURGERY     FEMORAL HERNIA REPAIR Left 07/17/2016   Procedure: LEFT FEMORAL  HERNIA REPAIR WITH MESH;  Surgeon: Dareen Ebbing, MD;  Location: [facility];  Service: General;  Laterality: Left;   GASTROJEJUNOSTOMY N/A 07/04/2020   Procedure: GASTROSTOMY TUBE;  Surgeon: Adalberto Acton, MD;  Location: [facility] OR;  Service: General;  Laterality: N/A;   GASTROSTOMY N/A 03/27/2022   Procedure: INSERTION OF GASTROSTOMY TUBE;  Surgeon: Adalberto Acton, MD;  [facility];  Service: General;  Laterality: N/A;   HIATAL HERNIA REPAIR N/A 03/27/2022   Procedure: LAPAROSCOPIC REPAIR OF HIATAL HERNIA;  Surgeon: Adalberto Acton, MD;  [facility];  Service: General;  Laterality: N/A;   IVC FILTER INSERTION N/A 05/02/2022   Procedure: IVC FILTER INSERTION;  Surgeon: Carlene Che, MD;  [facility];  Service: Cardiovascular;  Laterality: N/A;   IVC VENOGRAPHY N/A 05/02/2022   Procedure: IVC Venography;  Surgeon: Carlene Che, MD;  [facility];  Service: Cardiovascular;  Laterality: N/A;   LAPAROSCOPIC LYSIS OF ADHESIONS N/A 03/27/2022   Procedure: LAPAROSCOPIC LYSIS OF ADHESIONS;  Surgeon: Adalberto Acton, MD;  [facility];  Service: General;  Laterality: N/A;   LAPAROTOMY N/A 07/04/2020   Procedure: EXPLORATORY LAPAROTOMY;  Surgeon: Adalberto Acton, MD;  [facility];  Service:  General;  Laterality: N/A;   LASIK  2023   PLEURAL EFFUSION DRAINAGE Right 05/07/2022   Procedure: DRAINAGE OF PLEURAL EFFUSION;  Surgeon: Zelphia Higashi, MD;  [facility];  Service: Thoracic;  Laterality: Right;   VIDEO ASSISTED THORACOSCOPY (VATS)/DECORTICATION Right 05/07/2022   Procedure: VIDEO ASSISTED THORACOSCOPY (VATS)/DECORTICATION;  Surgeon: Zelphia Higashi, MD;  Location: [facility] OR;  Service: Thoracic;  Laterality: Right;    History reviewed. No pertinent family history.  Social:  reports that she has never smoked. She has never used smokeless tobacco. She reports that she does not drink alcohol and does not use drugs.  Allergies:  Allergies  Allergen Reactions   Chocolate Hives   Latex Itching    hands    Medications: Current Outpatient Medications  Medication Instructions   amLODipine  (NORVASC ) 10 MG tablet TAKE 1 TABLET(10 MG) BY MOUTH AT BEDTIME   brimonidine-timolol (COMBIGAN) 0.2-0.5 % ophthalmic solution 1 drop, Both Eyes, 2 times daily   Cal Carb-Mag Hydrox-Simeth (ROLAIDS ADVANCED) 1000-200-40 MG CHEW 1 tablet, Oral, 3 times daily PRN   LUMIGAN  0.01 % SOLN 1 drop, Both Eyes, Daily at bedtime   meloxicam  (MOBIC ) 15 mg, Oral, Daily   methocarbamol  (ROBAXIN ) 500 mg, Oral, 2 times daily   triamcinolone  cream (KENALOG ) 0.1 % 1 Application, Topical, 2 times daily   Vitamin D  (Ergocalciferol ) (DRISDOL ) 50,000 Units, Oral, Weekly    ROS -  all of the below systems have been reviewed with the patient and positives are indicated with bold text General: chills, fever or night sweats Eyes: blurry vision or double vision ENT: epistaxis or sore throat Allergy/Immunology: itchy/watery eyes or nasal congestion Hematologic/Lymphatic: bleeding problems, blood clots or swollen lymph nodes Endocrine: temperature intolerance or unexpected weight changes Breast: new or changing breast lumps or nipple discharge Resp: cough, shortness of breath, or wheezing CV: chest  pain or dyspnea on exertion GI: as per HPI GU: dysuria, trouble voiding, or hematuria MSK: joint pain or joint stiffness Neuro: TIA or stroke symptoms Derm: pruritus and skin lesion changes Psych: anxiety and depression  Objective   PE Blood pressure 112/76, pulse (!) 59, temperature 97.7 F (36.5 C), temperature source Oral, resp. rate 15, height 5\' 4"  (1.626 m), weight 49.9 kg, SpO2 100%. Constitutional: NAD; conversant; no deformities Eyes: Moist conjunctiva; no lid lag; anicteric; PERRL Neck: Trachea midline; no thyromegaly Lungs: Normal respiratory effort; no tactile fremitus CV: RRR; no palpable thrills; no pitting edema GI: Abd incisional hernia; no palpable hepatosplenomegaly MSK: Normal range of motion of extremities; no clubbing/cyanosis Psychiatric: Appropriate affect; alert and oriented x3 Lymphatic: No palpable cervical or axillary lymphadenopathy  Results for orders placed or performed during the hospital encounter of 08/21/23 (from the past 24 hours)  CBC per protocol     Status: Abnormal   Collection Time: 08/21/23  8:40 AM  Result Value Ref Range   WBC 3.2 (L) 4.0 - 10.5 K/uL   RBC 4.36 3.87 - 5.11 MIL/uL   Hemoglobin 12.9 12.0 - 15.0 g/dL   HCT 40.1 02.7 - 25.3 %   MCV 94.5 80.0 - 100.0 fL   MCH 29.6 26.0 - 34.0 pg   MCHC 31.3 30.0 - 36.0 g/dL   RDW 66.4 40.3 - 47.4 %   Platelets 184 150 - 400 K/uL   nRBC 0.0 0.0 - 0.2 %  Basic metabolic panel per protocol     Status: Abnormal   Collection Time: 08/21/23  8:40 AM  Result Value Ref Range   Sodium 136 135 - 145 mmol/L   Potassium 3.9 3.5 - 5.1 mmol/L   Chloride 105 98 - 111 mmol/L   CO2 24 22 - 32 mmol/L   Glucose, Bld 105 (H) 70 - 99 mg/dL   BUN 18 8 - 23 mg/dL   Creatinine, Ser 2.59 0.44 - 1.00 mg/dL   Calcium  9.5 8.9 - 10.3 mg/dL   GFR, Estimated >56 >38 mL/min   Anion gap 7 5 - 15    Imaging Orders  No imaging studies ordered today     Assessment and Plan   Alyssa Peterson is an 73 y.o.  female with an incisional hernia.  I recommended open incisional hernia repair with mesh, possible component separation.  We discussed the procedure, its risks, benefits and alternatives and the patient granted consent to proceed.    ICD-10-CM   1. Preop testing  Z01.818 CBG per Guidelines for Diabetes Management for Patients Undergoing Surgery ([facility], [facility], and [facility] only)    CBG per protocol    CBC per protocol    Basic metabolic panel per protocol    CBG per Guidelines for Diabetes Management for Patients Undergoing Surgery ([facility], [facility], and [facility] only)    CBG per protocol    CBC per protocol    Basic metabolic panel per protocol       Junie Olds, MD  [facility] Use [email or web address] to  contact on call provider

## 2023-08-21 NOTE — Transfer of Care (Signed)
 Immediate Anesthesia Transfer of Care Note  Patient: Alyssa Peterson  Procedure(s) Performed: REPAIR, HERNIA, INCISIONAL  Patient Location: PACU  Anesthesia Type:General  Level of Consciousness: drowsy  Airway & Oxygen Therapy: Patient Spontanous Breathing and Patient connected to face mask oxygen  Post-op Assessment: Report given to RN and Post -op Vital signs reviewed and stable  Post vital signs: Reviewed and stable  Last Vitals:  Vitals Value Taken Time  BP 129/83 08/21/23 1512  Temp    Pulse 63 08/21/23 1513  Resp 11 08/21/23 1513  SpO2 100 % 08/21/23 1513  Vitals shown include unfiled device data.  Last Pain:  Vitals:   08/21/23 0849  TempSrc:   PainSc: 0-No pain         Complications: No notable events documented.

## 2023-08-22 DIAGNOSIS — I10 Essential (primary) hypertension: Secondary | ICD-10-CM | POA: Diagnosis not present

## 2023-08-22 DIAGNOSIS — K432 Incisional hernia without obstruction or gangrene: Secondary | ICD-10-CM | POA: Diagnosis not present

## 2023-08-22 DIAGNOSIS — Z91040 Latex allergy status: Secondary | ICD-10-CM | POA: Diagnosis not present

## 2023-08-22 DIAGNOSIS — Z86711 Personal history of pulmonary embolism: Secondary | ICD-10-CM | POA: Diagnosis not present

## 2023-08-22 DIAGNOSIS — Z79899 Other long term (current) drug therapy: Secondary | ICD-10-CM | POA: Diagnosis not present

## 2023-08-22 LAB — BASIC METABOLIC PANEL WITH GFR
Anion gap: 8 (ref 5–15)
BUN: 14 mg/dL (ref 8–23)
CO2: 24 mmol/L (ref 22–32)
Calcium: 8.8 mg/dL — ABNORMAL LOW (ref 8.9–10.3)
Chloride: 102 mmol/L (ref 98–111)
Creatinine, Ser: 0.74 mg/dL (ref 0.44–1.00)
GFR, Estimated: >60 mL/min (ref >60)
Glucose, Bld: 125 mg/dL — ABNORMAL HIGH (ref 70–99)
Potassium: 4.1 mmol/L (ref 3.5–5.1)
Sodium: 134 mmol/L — ABNORMAL LOW (ref 135–145)

## 2023-08-22 LAB — CBC
HCT: 40.3 % (ref 36.0–46.0)
Hemoglobin: 13.3 g/dL (ref 12.0–15.0)
MCH: 30.4 pg (ref 26.0–34.0)
MCHC: 33 g/dL (ref 30.0–36.0)
MCV: 92 fL (ref 80.0–100.0)
Platelets: 201 10*3/uL (ref 150–400)
RBC: 4.38 MIL/uL (ref 3.87–5.11)
RDW: 12.7 % (ref 11.5–15.5)
WBC: 9.8 10*3/uL (ref 4.0–10.5)
nRBC: 0 % (ref 0.0–0.2)

## 2023-08-22 NOTE — Progress Notes (Signed)
 Discharge instructions given to patient questions asked and answered. Sylvia Everts RN

## 2023-08-22 NOTE — Discharge Summary (Signed)
 Physician Discharge Summary  Patient ID: Alyssa Peterson MRN: [id number] DOB/AGE: 1950/08/15 73 y.o.  Admit date: 08/21/2023 Discharge date: 08/22/2023  Admission Diagnoses:hernia s/p repair  Discharge Diagnoses:  Principal Problem:   Ventral hernia   Discharged Condition: good  Hospital Course: Patient with underwent open incisional hernia repair with mesh.  Patient did well postoperatively.  Please see op note for full details.  Patient did well from a pain standpoint had minimal pain.  She was tolerating p.o. well.  She is otherwise ambulating on her own.  She was stable for discharge and discharged home.  Consults: None  Significant Diagnostic Studies: none  Treatments: surgery: as above  Discharge Exam: Blood pressure 95/65, pulse (!) 51, temperature 97.6 F (36.4 C), temperature source Oral, resp. rate 16, height 5\' 4"  (1.626 m), weight 49.9 kg, SpO2 100%. General appearance: alert and cooperative GI: soft, non-tender; bowel sounds normal; no masses,  no organomegaly and inc c/ d/i  Disposition: Discharge disposition: 01-Home or Self Care       Discharge Instructions     Diet - low sodium heart healthy   Complete by: As directed    Increase activity slowly   Complete by: As directed       Allergies as of 08/22/2023       Reactions   Chocolate Hives   Latex Itching   hands        Medication List     TAKE these medications    amLODipine  10 MG tablet Commonly known as: NORVASC  TAKE 1 TABLET(10 MG) BY MOUTH AT BEDTIME   brimonidine -timolol  0.2-0.5 % ophthalmic solution Commonly known as: COMBIGAN  Place 1 drop into both eyes 2 (two) times daily.   Lumigan  0.01 % Soln Generic drug: bimatoprost  Place 1 drop into both eyes at bedtime.   meloxicam  15 MG tablet Commonly known as: Mobic  Take 1 tablet (15 mg total) by mouth daily. What changed:  when to take this reasons to take this   methocarbamol  500 MG tablet Commonly known as:  ROBAXIN  Take 1 tablet (500 mg total) by mouth 2 (two) times daily. What changed: Another medication with the same name was added. Make sure you understand how and when to take each.   methocarbamol  500 MG tablet Commonly known as: ROBAXIN  Take 1 tablet (500 mg total) by mouth 4 (four) times daily. What changed: You were already taking a medication with the same name, and this prescription was added. Make sure you understand how and when to take each.   oxyCODONE -acetaminophen  5-325 MG tablet Commonly known as: Percocet Take 1 tablet by mouth every 4 (four) hours as needed for severe pain (pain score 7-10).   Rolaids Advanced 1000-200-40 MG Chew Generic drug: Cal Carb-Mag Hydrox-Simeth Chew 1 tablet by mouth 3 (three) times daily as needed (heartburn).   triamcinolone  cream 0.1 % Commonly known as: KENALOG  Apply 1 Application topically 2 (two) times daily.   Vitamin D  (Ergocalciferol ) 1.25 MG (50000 UNIT) Caps capsule Commonly known as: DRISDOL  Take 1 capsule (50,000 Units total) by mouth once a week.        Follow-up Information     Stechschulte, Avon Boers, MD Follow up in 4 week(s).   Specialty: Surgery Contact information: [address] [place] [address] [phone]                 Signed: Shela Derby 08/22/2023, 8:06 AM

## 2023-08-24 ENCOUNTER — Encounter (HOSPITAL_COMMUNITY): Payer: Self-pay | Admitting: Surgery

## 2023-08-24 ENCOUNTER — Telehealth: Payer: Self-pay

## 2023-08-24 NOTE — Anesthesia Postprocedure Evaluation (Signed)
 Anesthesia Post Note  Patient: Alyssa Peterson  Procedure(s) Performed: REPAIR, HERNIA, INCISIONAL     Patient location during evaluation: PACU Anesthesia Type: General Level of consciousness: sedated and patient cooperative Pain management: pain level controlled Vital Signs Assessment: post-procedure vital signs reviewed and stable Respiratory status: spontaneous breathing Cardiovascular status: stable Anesthetic complications: no   No notable events documented.  Last Vitals:  Vitals:   08/22/23 0152 08/22/23 0642  BP: 111/75 95/65  Pulse: 63 (!) 51  Resp: 16 16  Temp: 36.9 C 36.4 C  SpO2: 98% 100%    Last Pain:  Vitals:   08/22/23 0800  TempSrc:   PainSc: 0-No pain                 Gorman Laughter

## 2023-08-24 NOTE — Transitions of Care (Post Inpatient/ED Visit) (Signed)
 08/24/2023  Name: Alyssa Peterson MRN: [id number] DOB: Jul 08, 1950  Today's TOC FU Call Status: Today's TOC FU Call Status:: Successful TOC FU Call Completed TOC FU Call Complete Date: 08/24/23 Patient's Name and Date of Birth confirmed.  Transition Care Management Follow-up Telephone Call Date of Discharge: 08/22/23 Discharge Facility: [facility] Type of Discharge: Inpatient Admission Primary Inpatient Discharge Diagnosis:: s/p incisional hernia repair How have you been since you were released from the hospital?: Better (She stated she is just sore.) Any questions or concerns?: No  Items Reviewed: Did you receive and understand the discharge instructions provided?: Yes Medications obtained,verified, and reconciled?: Yes (Medications Reviewed) Any new allergies since your discharge?: No Dietary orders reviewed?: Yes Type of Diet Ordered:: heart healthy, low sodium Do you have support at home?: Yes People in Home [RPT]: child(ren), adult Name of Support/Comfort Primary Source: her family has been assisting her as needed  Medications Reviewed Today: Medications Reviewed Today     Reviewed by Burnett Carson, RN (Case Manager) on 08/24/23 at 1524  Med List Status: <None>   Medication Order Taking? Sig Documenting Provider Last Dose Status Informant  amLODipine  (NORVASC ) 10 MG tablet [id number] No TAKE 1 TABLET(10 MG) BY MOUTH AT BEDTIME Newlin, Enobong, MD 08/20/2023 Bedtime Active Self  brimonidine -timolol  (COMBIGAN ) 0.2-0.5 % ophthalmic solution [id number] No Place 1 drop into both eyes 2 (two) times daily. [provider] Past Week Active Self  Cal Carb-Mag Hydrox-Simeth [facility] ADVANCED) 1000-200-40 MG CHEW [id number] No Chew 1 tablet by mouth 3 (three) times daily as needed (heartburn). [provider] Past Week Active Self  LUMIGAN  0.01 % SOLN [id number] No Place 1 drop into both eyes at bedtime. Wilhemena Harbour, NP Past Week Active Self  meloxicam   (MOBIC ) 15 MG tablet [id number]  Take 1 tablet (15 mg total) by mouth daily.  Patient taking differently: Take 15 mg by mouth daily as needed for pain.   Afton Albright, MD  Active Self  methocarbamol  (ROBAXIN ) 500 MG tablet [id number] No Take 1 tablet (500 mg total) by mouth 2 (two) times daily.  Patient not taking: Reported on 08/18/2023   Afton Albright, MD Not Taking Active Self  methocarbamol  (ROBAXIN ) 500 MG tablet [id number]  Take 1 tablet (500 mg total) by mouth 4 (four) times daily. Stechschulte, Avon Boers, MD  Active   oxyCODONE -acetaminophen  (PERCOCET) 5-325 MG tablet [id number]  Take 1 tablet by mouth every 4 (four) hours as needed for severe pain (pain score 7-10). Stechschulte, Avon Boers, MD  Active   triamcinolone  cream (KENALOG ) 0.1 % [id number] No Apply 1 Application topically 2 (two) times daily.  Patient not taking: Reported on 08/18/2023   Hassie Lint, PA-C Not Taking Active Self  Vitamin D , Ergocalciferol , (DRISDOL ) 1.25 MG (50000 UNIT) CAPS capsule [id number] No Take 1 capsule (50,000 Units total) by mouth once a week.  Patient not taking: Reported on 08/18/2023   Collins Dean, NP Not Taking Active Self            Home Care and Equipment/Supplies: Were Home Health Services Ordered?: No Any new equipment or medical supplies ordered?: No  Functional Questionnaire: Do you need assistance with bathing/showering or dressing?: No Do you need assistance with meal preparation?: Yes (family assists) Do you need assistance with eating?: No Do you have difficulty maintaining continence: No Do you need assistance with getting out of bed/getting out of a chair/moving?: No Do you have difficulty managing or taking your medications?: No  Follow up appointments  reviewed: PCP Follow-up appointment confirmed?: Yes Date of PCP follow-up appointment?: 08/26/23 Follow-up Provider: Winda Hastings, NP Specialist Hospital Follow-up appointment confirmed?: Yes Date of Specialist follow-up  appointment?: 09/18/23 Follow-Up Specialty Provider:: surgeon Do you need transportation to your follow-up appointment?: No Do you understand care options if your condition(s) worsen?: Yes-patient verbalized understanding    SIGNATURE. Burnett Carson, RN

## 2023-08-26 ENCOUNTER — Ambulatory Visit: Attending: Nurse Practitioner | Admitting: Nurse Practitioner

## 2023-08-26 ENCOUNTER — Encounter: Payer: Self-pay | Admitting: Nurse Practitioner

## 2023-08-26 VITALS — BP 100/68 | HR 67 | Resp 18 | Ht 64.0 in | Wt 107.0 lb

## 2023-08-26 DIAGNOSIS — I10 Essential (primary) hypertension: Secondary | ICD-10-CM | POA: Diagnosis not present

## 2023-08-26 MED ORDER — AMLODIPINE BESYLATE 10 MG PO TABS: 10.0000 mg | ORAL_TABLET | Freq: Every day | ORAL | 1 refills | Status: DC

## 2023-08-26 NOTE — Patient Instructions (Signed)
 CENTRUM SILVER MVI CITRACAL with D

## 2023-08-26 NOTE — Progress Notes (Signed)
 Assessment & Plan:  Alyssa Peterson was seen today for hypertension.  Diagnoses and all orders for this visit:  Primary hypertension -     amLODipine  (NORVASC ) 10 MG tablet; Take 1 tablet (10 mg total) by mouth daily. Continue all antihypertensives as prescribed.  Reminded to bring in blood pressure log for follow  up appointment.  RECOMMENDATIONS: DASH/Mediterranean Diets are healthier choices for HTN.     Patient has been counseled on age-appropriate routine health concerns for screening and prevention. These are reviewed and up-to-date. Referrals have been placed accordingly. Immunizations are up-to-date or declined.    Subjective:   Chief Complaint  Patient presents with   Hypertension    Alyssa Peterson 73 y.o. female presents to office today for follow up to HTN  She has a past medical history of Anemia, Arthritis, Diaphragmatic hernia with obstruction, Gastric volvulus, GERD, Glaucoma, History of hiatal hernia, Hypertension, Inguinal hernia, and Pulmonary embolism.   States she fell down the stairs this morning but did not hit her head. She states she lost her balance and fell on her buttocks. She is currently not in any pain today but just wanted me to make sure she was okay since she recently had hernia repair surgery.     HTN Blood pressure is well controlled. She is currently taking amlodipine  10 mg daily as prescribed.  BP Readings from Last 3 Encounters:  08/26/23 100/68  08/22/23 95/65  08/08/23 104/70         Review of Systems  Constitutional:  Negative for fever, malaise/fatigue and weight loss.  HENT: Negative.  Negative for nosebleeds.   Eyes: Negative.  Negative for blurred vision, double vision and photophobia.  Respiratory: Negative.  Negative for cough and shortness of breath.   Cardiovascular: Negative.  Negative for chest pain, palpitations and leg swelling.  Gastrointestinal: Negative.  Negative for heartburn, nausea and vomiting.  Musculoskeletal:  Negative.  Negative for myalgias.  Neurological: Negative.  Negative for dizziness, focal weakness, seizures and headaches.  Psychiatric/Behavioral: Negative.  Negative for suicidal ideas.     Past Medical History:  Diagnosis Date   Anemia    Hx   Arthritis    Diaphragmatic hernia with obstruction    Gastric volvulus    GERD (gastroesophageal reflux disease)    Glaucoma    History of hiatal hernia    Hypertension    Inguinal hernia    left   Pulmonary embolism (HCC)     Past Surgical History:  Procedure Laterality Date   COLONOSCOPY     CYST REMOVAL NECK     DENTAL SURGERY     FEMORAL HERNIA REPAIR Left 07/17/2016   Procedure: LEFT FEMORAL  HERNIA REPAIR WITH MESH;  Surgeon: Dareen Ebbing, MD;  Location: [facility];  Service: General;  Laterality: Left;   GASTROJEJUNOSTOMY N/A 07/04/2020   Procedure: GASTROSTOMY TUBE;  Surgeon: Adalberto Acton, MD;  Location: [facility] OR;  Service: General;  Laterality: N/A;   GASTROSTOMY N/A 03/27/2022   Procedure: INSERTION OF GASTROSTOMY TUBE;  Surgeon: Adalberto Acton, MD;  [facility];  Service: General;  Laterality: N/A;   HIATAL HERNIA REPAIR N/A 03/27/2022   Procedure: LAPAROSCOPIC REPAIR OF HIATAL HERNIA;  Surgeon: Adalberto Acton, MD;  [facility];  Service: General;  Laterality: N/A;   INCISIONAL HERNIA REPAIR N/A 08/21/2023   Procedure: REPAIR, HERNIA, INCISIONAL;  Surgeon: Junie Olds, MD;  Location: [facility];  Service: General;  Laterality: N/A;  OPEN INCISIONAL  HERNIA REPAIR WITH MESH, BILATERAL POSTERIOR RECTUS MYOFASCIAL RELEASE   IVC FILTER INSERTION N/A 05/02/2022   Procedure: IVC FILTER INSERTION;  Surgeon: Carlene Che, MD;  [facility];  Service: Cardiovascular;  Laterality: N/A;   IVC VENOGRAPHY N/A 05/02/2022   Procedure: IVC Venography;  Surgeon: Carlene Che, MD;  [facility];  Service: Cardiovascular;  Laterality: N/A;   LAPAROSCOPIC LYSIS OF  ADHESIONS N/A 03/27/2022   Procedure: LAPAROSCOPIC LYSIS OF ADHESIONS;  Surgeon: Adalberto Acton, MD;  [facility];  Service: General;  Laterality: N/A;   LAPAROTOMY N/A 07/04/2020   Procedure: EXPLORATORY LAPAROTOMY;  Surgeon: Adalberto Acton, MD;  [facility];  Service: General;  Laterality: N/A;   LASIK  2023   PLEURAL EFFUSION DRAINAGE Right 05/07/2022   Procedure: DRAINAGE OF PLEURAL EFFUSION;  Surgeon: Zelphia Higashi, MD;  Location[facility];  Service: Thoracic;  Laterality: Right;   VIDEO ASSISTED THORACOSCOPY (VATS)/DECORTICATION Right 05/07/2022   Procedure: VIDEO ASSISTED THORACOSCOPY (VATS)/DECORTICATION;  Surgeon: Zelphia Higashi, MD;  Location: [facility] OR;  Service: Thoracic;  Laterality: Right;    No family history on file.  Social History Reviewed with no changes to be made today.   Outpatient Medications Prior to Visit  Medication Sig Dispense Refill   brimonidine -timolol  (COMBIGAN ) 0.2-0.5 % ophthalmic solution Place 1 drop into both eyes 2 (two) times daily.     LUMIGAN  0.01 % SOLN Place 1 drop into both eyes at bedtime. 2.5 mL 0   triamcinolone  cream (KENALOG ) 0.1 % Apply 1 Application topically 2 (two) times daily. 45 g 2   amLODipine  (NORVASC ) 10 MG tablet TAKE 1 TABLET(10 MG) BY MOUTH AT BEDTIME 90 tablet 1   Cal Carb-Mag Hydrox-Simeth (ROLAIDS ADVANCED) 1000-200-40 MG CHEW Chew 1 tablet by mouth 3 (three) times daily as needed (heartburn). (Patient not taking: Reported on 08/26/2023)     meloxicam  (MOBIC ) 15 MG tablet Take 1 tablet (15 mg total) by mouth daily. (Patient not taking: Reported on 08/26/2023) 14 tablet 0   methocarbamol  (ROBAXIN ) 500 MG tablet Take 1 tablet (500 mg total) by mouth 2 (two) times daily. (Patient not taking: Reported on 08/18/2023) 20 tablet 0   methocarbamol  (ROBAXIN ) 500 MG tablet Take 1 tablet (500 mg total) by mouth 4 (four) times daily. (Patient not taking: Reported on 08/26/2023) 30 tablet 0   oxyCODONE -acetaminophen  (PERCOCET)  5-325 MG tablet Take 1 tablet by mouth every 4 (four) hours as needed for severe pain (pain score 7-10). (Patient not taking: Reported on 08/26/2023) 20 tablet 0   Vitamin D , Ergocalciferol , (DRISDOL ) 1.25 MG (50000 UNIT) CAPS capsule Take 1 capsule (50,000 Units total) by mouth once a week. (Patient not taking: Reported on 08/18/2023) 12 capsule 0   No facility-administered medications prior to visit.    Allergies  Allergen Reactions   Chocolate Hives   Latex Itching    hands       Objective:    BP 100/68 (BP Location: Left Arm, Patient Position: Sitting, Cuff Size: Normal)   Pulse 67   Resp 18   Ht 5\' 4"  (1.626 m)   Wt 107 lb (48.5 kg)   SpO2 99%   BMI 18.37 kg/m  Wt Readings from Last 3 Encounters:  08/26/23 107 lb (48.5 kg)  08/21/23 110 lb (49.9 kg)  05/26/23 112 lb 6.4 oz (51 kg)    Physical Exam Vitals and nursing note reviewed.  Constitutional:      Appearance: She  is well-developed.  HENT:     Head: Normocephalic and atraumatic.  Cardiovascular:     Rate and Rhythm: Normal rate and regular rhythm.     Heart sounds: Normal heart sounds. No murmur heard.    No friction rub. No gallop.  Pulmonary:     Effort: Pulmonary effort is normal. No tachypnea or respiratory distress.     Breath sounds: Normal breath sounds. No decreased breath sounds, wheezing, rhonchi or rales.  Chest:     Chest wall: No tenderness.  Abdominal:     General: Bowel sounds are normal.     Palpations: Abdomen is soft.  Musculoskeletal:        General: Normal range of motion.     Cervical back: Normal range of motion.  Skin:    General: Skin is warm and dry.  Neurological:     Mental Status: She is alert and oriented to person, place, and time.     Coordination: Coordination normal.  Psychiatric:        Behavior: Behavior normal. Behavior is cooperative.        Thought Content: Thought content normal.        Judgment: Judgment normal.          Patient has been counseled  extensively about nutrition and exercise as well as the importance of adherence with medications and regular follow-up. The patient was given clear instructions to go to ER or return to medical center if symptoms don't improve, worsen or new problems develop. The patient verbalized understanding.   Follow-up: Return in about 3 months (around 11/26/2023).   Collins Dean, FNP-BC [facility] [place], [place] [phone]   08/26/2023, 9:29 AM

## 2023-08-26 NOTE — Progress Notes (Signed)
 Alyssa Peterson coming down the stairs and would like to check to see if she is ok.

## 2023-10-01 ENCOUNTER — Telehealth: Payer: Self-pay | Admitting: Oncology

## 2023-10-01 NOTE — Telephone Encounter (Signed)
 Left patient a vm regarding upcoming appointment

## 2023-10-09 ENCOUNTER — Other Ambulatory Visit

## 2023-10-09 ENCOUNTER — Ambulatory Visit: Admitting: Oncology

## 2023-10-22 DIAGNOSIS — H401133 Primary open-angle glaucoma, bilateral, severe stage: Secondary | ICD-10-CM | POA: Diagnosis not present

## 2023-11-04 ENCOUNTER — Other Ambulatory Visit: Payer: Self-pay | Admitting: Oncology

## 2023-11-04 ENCOUNTER — Other Ambulatory Visit: Payer: Self-pay

## 2023-11-04 DIAGNOSIS — D509 Iron deficiency anemia, unspecified: Secondary | ICD-10-CM

## 2023-11-05 ENCOUNTER — Telehealth: Payer: Self-pay

## 2023-11-05 ENCOUNTER — Inpatient Hospital Stay: Admitting: Oncology

## 2023-11-05 ENCOUNTER — Telehealth: Payer: Self-pay | Admitting: Oncology

## 2023-11-05 ENCOUNTER — Inpatient Hospital Stay

## 2023-11-05 NOTE — Telephone Encounter (Signed)
 Attempt to re-schedule Alyssa Peterson from her No show on today.

## 2023-11-05 NOTE — Telephone Encounter (Signed)
 Left a voicemail with patient letting her know that we would reach out to her to reachedule the missed appt with Dr. Autumn today.  Scheduler notified to follow up with patient.

## 2023-11-09 ENCOUNTER — Telehealth: Payer: Self-pay | Admitting: Oncology

## 2023-11-09 NOTE — Telephone Encounter (Signed)
 I re-scheduled Alyssa Peterson's appointment, I asked that she return my call if she needs to re-schedule.

## 2023-11-09 NOTE — Telephone Encounter (Signed)
 Alyssa Peterson called to re-schedule

## 2023-11-20 ENCOUNTER — Encounter: Payer: Self-pay | Admitting: Oncology

## 2023-11-20 ENCOUNTER — Inpatient Hospital Stay: Admitting: Oncology

## 2023-11-20 ENCOUNTER — Inpatient Hospital Stay: Attending: Oncology

## 2023-11-20 VITALS — BP 115/76 | HR 57 | Temp 98.7°F | Resp 16 | Ht 64.0 in | Wt 112.0 lb

## 2023-11-20 DIAGNOSIS — Z7901 Long term (current) use of anticoagulants: Secondary | ICD-10-CM | POA: Insufficient documentation

## 2023-11-20 DIAGNOSIS — Z86711 Personal history of pulmonary embolism: Secondary | ICD-10-CM | POA: Insufficient documentation

## 2023-11-20 DIAGNOSIS — Z79899 Other long term (current) drug therapy: Secondary | ICD-10-CM | POA: Insufficient documentation

## 2023-11-20 DIAGNOSIS — D509 Iron deficiency anemia, unspecified: Secondary | ICD-10-CM

## 2023-11-20 DIAGNOSIS — I10 Essential (primary) hypertension: Secondary | ICD-10-CM | POA: Insufficient documentation

## 2023-11-20 DIAGNOSIS — D72819 Decreased white blood cell count, unspecified: Secondary | ICD-10-CM

## 2023-11-20 LAB — CBC WITH DIFFERENTIAL (CANCER CENTER ONLY)
Abs Immature Granulocytes: 0 K/uL (ref 0.00–0.07)
Basophils Absolute: 0 K/uL (ref 0.0–0.1)
Basophils Relative: 1 %
Eosinophils Absolute: 0.3 K/uL (ref 0.0–0.5)
Eosinophils Relative: 6 %
HCT: 36 % (ref 36.0–46.0)
Hemoglobin: 11.9 g/dL — ABNORMAL LOW (ref 12.0–15.0)
Immature Granulocytes: 0 %
Lymphocytes Relative: 29 %
Lymphs Abs: 1.2 K/uL (ref 0.7–4.0)
MCH: 29.5 pg (ref 26.0–34.0)
MCHC: 33.1 g/dL (ref 30.0–36.0)
MCV: 89.1 fL (ref 80.0–100.0)
Monocytes Absolute: 0.4 K/uL (ref 0.1–1.0)
Monocytes Relative: 9 %
Neutro Abs: 2.3 K/uL (ref 1.7–7.7)
Neutrophils Relative %: 55 %
Platelet Count: 231 K/uL (ref 150–400)
RBC: 4.04 MIL/uL (ref 3.87–5.11)
RDW: 13 % (ref 11.5–15.5)
WBC Count: 4.2 K/uL (ref 4.0–10.5)
nRBC: 0 % (ref 0.0–0.2)

## 2023-11-20 LAB — FERRITIN: Ferritin: 47 ng/mL (ref 11–307)

## 2023-11-20 LAB — FOLATE: Folate: 12.8 ng/mL (ref >5.9)

## 2023-11-20 LAB — VITAMIN B12: Vitamin B-12: 579 pg/mL (ref 180–914)

## 2023-11-20 LAB — IRON AND IRON BINDING CAPACITY (CC-WL,HP ONLY)
Iron: 82 ug/dL (ref 28–170)
Saturation Ratios: 26 % (ref 10.4–31.8)
TIBC: 321 ug/dL (ref 250–450)
UIBC: 239 ug/dL (ref 148–442)

## 2023-11-20 MED ORDER — FERROUS SULFATE 325 (65 FE) MG PO TBEC: 325.0000 mg | DELAYED_RELEASE_TABLET | Freq: Every day | ORAL | 3 refills | Status: AC

## 2023-11-20 NOTE — Assessment & Plan Note (Signed)
 Lately her white count has been within normal limits.  Fluctuating white count but close to normal.  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

## 2023-11-20 NOTE — Assessment & Plan Note (Addendum)
 She was noted to have microcytic anemia. History of anemia with prior iron and blood transfusions. No active bleeding.   On her initial consultation with us  on 03/11/2023, labs showed persistent anemia with hemoglobin of 10.2, hematocrit 34.5, MCV 74.8.  White count and platelet count were within normal limits.  CMP grossly unremarkable.  Ferritin decreased at 7, iron saturation decreased at 5%, iron decreased at 25, indicative of iron deficiency state.  B12, folate, LDH were all within normal limits.   She was treated with IV iron using Feraheme  x 2 doses in December 2024 to January 2025.   Repeat labs on 06/10/2023 showed much improved hemoglobin of 13, MCV normal at 85.  Iron studies showed no evidence of iron deficiency currently.  Labs today showed stable hemoglobin of 11.9 with MCV of 36.  White count and platelet count are within normal limits.  Iron studies currently show no evidence of iron deficiency.  Vitamin B12 and folate are also within normal limits.  Colonoscopy recommended as last one was over five years ago.  She was advised to continue taking oral iron supplements once daily with vitamin C.  RTC in 6 months for follow-up with repeat labs.

## 2023-11-20 NOTE — Progress Notes (Signed)
 [facility]  HEMATOLOGY CLINIC PROGRESS NOTE  PATIENT NAME: Alyssa Peterson   MR#: [id number] DOB: 11/27/1950  Patient Care Team: Alyssa Peterson ORN, NP as PCP - General (Nurse Practitioner)  Date of visit: 11/20/2023   ASSESSMENT & PLAN:   Alyssa Peterson is a 73 y.o. pleasant lady with a past medical history of pulmonary embolism diagnosed in January 2024, completed 6 months of anticoagulation with Eliquis, hypertension, GERD, inguinal hernia, was referred to our service for evaluation of anemia.  Workup was consistent with iron deficiency anemia.  Treated with IV iron.  Iron deficiency anemia, unspecified She was noted to have microcytic anemia. History of anemia with prior iron and blood transfusions. No active bleeding.   On her initial consultation with us  on 03/11/2023, labs showed persistent anemia with hemoglobin of 10.2, hematocrit 34.5, MCV 74.8.  White count and platelet count were within normal limits.  CMP grossly unremarkable.  Ferritin decreased at 7, iron saturation decreased at 5%, iron decreased at 25, indicative of iron deficiency state.  B12, folate, LDH were all within normal limits.   She was treated with IV iron using Feraheme  x 2 doses in December 2024 to January 2025.   Repeat labs on 06/10/2023 showed much improved hemoglobin of 13, MCV normal at 85.  Iron studies showed no evidence of iron deficiency currently.  Labs today showed stable hemoglobin of 11.9 with MCV of 36.  White count and platelet count are within normal limits.  Iron studies currently show no evidence of iron deficiency.  Vitamin B12 and folate are also within normal limits.  Colonoscopy recommended as last one was over five years ago.  She was advised to continue taking oral iron supplements once daily with vitamin C.  RTC in 6 months for follow-up with repeat labs.   Hx of Leukopenia Lately her white count has been within normal limits.  Fluctuating white count but  close to normal.  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   I spent a total of 20 minutes during this encounter with the patient including review of chart and various tests results, discussions about plan of care and coordination of care plan.  I reviewed lab results and outside records for this visit and discussed relevant results with the patient. Diagnosis, plan of care and treatment options were also discussed in detail with the patient. Opportunity provided to ask questions and answers provided to her apparent satisfaction. Provided instructions to call our clinic with any problems, questions or concerns prior to return visit. I recommended to continue follow-up with PCP and sub-specialists. She verbalized understanding and agreed with the plan. No barriers to learning was detected.  Alyssa Patten, MD  11/20/2023 2:28 PM  [facility] - A DEPT OF [facility] [address] [place] [address] Dept: [phone] Dept Fax: [phone]   CHIEF COMPLAINT/ REASON FOR VISIT:  Follow-up for iron deficiency anemia.  Unspecified iron deficiency.  INTERVAL HISTORY:  Discussed the use of AI scribe software for clinical note transcription with the patient, who gave verbal consent to proceed.  History of Present Illness Alyssa Peterson is a 73 year old female who presents for follow-up of her hematological status.  Her energy levels are good overall. She has been taking iron supplements, initially once a day, but recently switched to every other day. She did not experience any side effects from the daily dosage but was unsure if  it was too much. She has been using over-the-counter iron supplements after finishing her prescription.  Recent blood work shows her white blood cell count is 4,200, which was previously low. Her hemoglobin is reported as normal, and her platelet count is 11.9. She had received IV iron treatment once in December  of the previous year.  No side effects from her iron supplementation.   SUMMARY OF HEMATOLOGIC HISTORY:  On routine follow-up with her PCP on 02/11/2023, labs showed hemoglobin of 10, hematocrit 33.1, MCV 74.  White count was 3700 with ANC of 1000.8, normal differential.  Platelets were normal at 270,000.  Previously hemoglobin was 9.6 in February 2024.  Given persistent anemia, referral was sent to us  for further evaluation.   She has a history of anemia requiring iron infusions and blood transfusions approximately 22-24 years ago. The patient reports no recent blood loss. However, she did have significant blood loss due to blood clots prior to menopause. She has not been on iron supplements recently but tolerated them well in the past. The patient also had a blood clot in the lungs following hernia surgery in January of this year, for which she was on blood thinners for six months. She is currently off most of her medications, with the exception of amlodipine  for blood pressure, eye drops, vitamin D , and an antacid as needed.   She denies recent chest pain on exertion, shortness of breath on minimal exertion, pre-syncopal episodes, or palpitations.   She had not noticed any recent bleeding such as epistaxis, hematuria or hematochezia.    Her last colonoscopy was more than 5 years ago.   On her initial consultation with us  on 03/11/2023, labs showed persistent anemia with hemoglobin of 10.2, hematocrit 34.5, MCV 74.8.  White count and platelet count were within normal limits.  CMP grossly unremarkable.  Ferritin decreased at 7, iron saturation decreased at 5%, iron decreased at 25, indicative of iron deficiency state.  B12, folate, LDH were all within normal limits.   She was treated with IV iron using Feraheme  x 2 doses.   Colonoscopy recommended as last one was over five years ago.   Repeat labs on 06/10/2023 showed much improved hemoglobin of 13, MCV normal at 85.  I have reviewed the past  medical history, past surgical history, social history and family history with the patient and they are unchanged from previous note.  ALLERGIES: She is allergic to chocolate and latex.  MEDICATIONS:  Current Outpatient Medications  Medication Sig Dispense Refill   amLODipine  (NORVASC ) 10 MG tablet Take 1 tablet (10 mg total) by mouth daily. 90 tablet 1   brimonidine -timolol  (COMBIGAN ) 0.2-0.5 % ophthalmic solution Place 1 drop into both eyes 2 (two) times daily.     ferrous sulfate  325 (65 FE) MG EC tablet Take 1 tablet (325 mg total) by mouth daily with breakfast. 90 tablet 3   LUMIGAN  0.01 % SOLN Place 1 drop into both eyes at bedtime. 2.5 mL 0   triamcinolone  cream (KENALOG ) 0.1 % Apply 1 Application topically 2 (two) times daily. 45 g 2   No current facility-administered medications for this visit.     REVIEW OF SYSTEMS:    Review of Systems - Oncology  All other pertinent systems were reviewed with the patient and are negative.  PHYSICAL EXAMINATION:   Onc Performance Status - 11/20/23 1146       ECOG Perf Status   ECOG Perf Status Fully active, able to carry on all pre-disease performance  without restriction      KPS SCALE   KPS % SCORE Normal, no compliants, no evidence of disease          Vitals:   11/20/23 1136  BP: 115/76  Pulse: (!) 57  Resp: 16  Temp: 98.7 F (37.1 C)  SpO2: 100%   Filed Weights   11/20/23 1136  Weight: 112 lb (50.8 kg)    Physical Exam Constitutional:      General: She is not in acute distress.    Appearance: Normal appearance.  HENT:     Head: Normocephalic and atraumatic.  Eyes:     General: No scleral icterus.    Conjunctiva/sclera: Conjunctivae normal.  Cardiovascular:     Rate and Rhythm: Normal rate and regular rhythm.     Heart sounds: Normal heart sounds.  Pulmonary:     Effort: Pulmonary effort is normal.     Breath sounds: Normal breath sounds.  Abdominal:     General: There is no distension.   Musculoskeletal:     Right lower leg: No edema.     Left lower leg: No edema.  Neurological:     General: No focal deficit present.     Mental Status: She is alert and oriented to person, place, and time.  Psychiatric:        Mood and Affect: Mood normal.        Behavior: Behavior normal.        Thought Content: Thought content normal.     LABORATORY DATA:   I have reviewed the data as listed.  Results for orders placed or performed in visit on 11/20/23  Folate  Result Value Ref Range   Folate 12.8 >5.9 ng/mL  Vitamin B12  Result Value Ref Range   Vitamin B-12 579 180 - 914 pg/mL  Iron and Iron Binding Capacity ([facility] only)  Result Value Ref Range   Iron 82 28 - 170 ug/dL   TIBC 678 749 - 549 ug/dL   Saturation Ratios 26 10.4 - 31.8 %   UIBC 239 148 - 442 ug/dL  CBC with Differential (Cancer Center Only)  Result Value Ref Range   WBC Count 4.2 4.0 - 10.5 K/uL   RBC 4.04 3.87 - 5.11 MIL/uL   Hemoglobin 11.9 (L) 12.0 - 15.0 g/dL   HCT 63.9 63.9 - 53.9 %   MCV 89.1 80.0 - 100.0 fL   MCH 29.5 26.0 - 34.0 pg   MCHC 33.1 30.0 - 36.0 g/dL   RDW 86.9 88.4 - 84.4 %   Platelet Count 231 150 - 400 K/uL   nRBC 0.0 0.0 - 0.2 %   Neutrophils Relative % 55 %   Neutro Abs 2.3 1.7 - 7.7 K/uL   Lymphocytes Relative 29 %   Lymphs Abs 1.2 0.7 - 4.0 K/uL   Monocytes Relative 9 %   Monocytes Absolute 0.4 0.1 - 1.0 K/uL   Eosinophils Relative 6 %   Eosinophils Absolute 0.3 0.0 - 0.5 K/uL   Basophils Relative 1 %   Basophils Absolute 0.0 0.0 - 0.1 K/uL   Immature Granulocytes 0 %   Abs Immature Granulocytes 0.00 0.00 - 0.07 K/uL    RADIOGRAPHIC STUDIES:  No recent pertinent imaging available to review.   Orders Placed This Encounter  Procedures   CBC with Differential (Cancer Center Only)    Standing Status:   Future    Expected Date:   05/21/2024    Expiration Date:   08/19/2024  Iron and Iron Binding Capacity ([facility] only)    Standing Status:   Future    Expected  Date:   05/21/2024    Expiration Date:   08/19/2024   Ferritin    Standing Status:   Future    Expected Date:   05/21/2024    Expiration Date:   08/19/2024     Future Appointments  Date Time Provider Department Center  11/27/2023  2:30 PM Alyssa Peterson ORN, NP [facility]    This document was completed utilizing speech recognition software. Grammatical errors, random word insertions, pronoun errors, and incomplete sentences are an occasional consequence of this system due to software limitations, ambient noise, and hardware issues. Any formal questions or concerns about the content, text or information contained within the body of this dictation should be directly addressed to the provider for clarification.

## 2023-11-25 ENCOUNTER — Telehealth: Payer: Self-pay | Admitting: Nurse Practitioner

## 2023-11-25 NOTE — Telephone Encounter (Signed)
 Pt confirmed appt 9/3 (per vr)

## 2023-11-27 ENCOUNTER — Encounter: Payer: Self-pay | Admitting: Nurse Practitioner

## 2023-11-27 ENCOUNTER — Ambulatory Visit: Attending: Nurse Practitioner | Admitting: Nurse Practitioner

## 2023-11-27 VITALS — BP 112/64 | HR 50 | Resp 19 | Ht 64.0 in | Wt 106.6 lb

## 2023-11-27 DIAGNOSIS — I10 Essential (primary) hypertension: Secondary | ICD-10-CM

## 2023-11-27 DIAGNOSIS — R7303 Prediabetes: Secondary | ICD-10-CM

## 2023-11-27 NOTE — Progress Notes (Signed)
 Assessment & Plan:  Alyssa Peterson was seen today for hypertension.  Diagnoses and all orders for this visit:  Primary hypertension -     CMP14+EGFR Continue amlodipine  as prescribed.  Reminded to bring in blood pressure log for follow  up appointment.  RECOMMENDATIONS: DASH/Mediterranean Diets are healthier choices for HTN.    Prediabetes -     Hemoglobin [name]    Patient has been counseled on age-appropriate routine health concerns for screening and prevention. These are reviewed and up-to-date. Referrals have been placed accordingly. Immunizations are up-to-date or declined.    Subjective:   Chief Complaint  Patient presents with   Hypertension    Alyssa Peterson 73 y.o. female presents to office today for follow up to HTN.   She has a past medical history of Anemia, Arthritis, Diaphragmatic hernia with obstruction, Gastric volvulus, GERD, Glaucoma, History of hiatal hernia, Hypertension, Inguinal hernia, and Pulmonary embolism.     HTN Blood pressure at goal. She is taking amlodipine  10 mg daily. BP Readings from Last 3 Encounters:  11/27/23 112/64  11/20/23 115/76  08/26/23 100/68     [name] at goal. Controlled with diet only at this time.  Lab Results  Component Value Date   HGBA1C 5.9 (H) 01/07/2022       Review of Systems  Constitutional:  Negative for fever, malaise/fatigue and weight loss.  HENT: Negative.  Negative for nosebleeds.   Eyes: Negative.  Negative for blurred vision, double vision and photophobia.  Respiratory: Negative.  Negative for cough and shortness of breath.   Cardiovascular: Negative.  Negative for chest pain, palpitations and leg swelling.  Gastrointestinal: Negative.  Negative for heartburn, nausea and vomiting.  Musculoskeletal: Negative.  Negative for myalgias.  Neurological: Negative.  Negative for dizziness, focal weakness, seizures and headaches.  Psychiatric/Behavioral: Negative.  Negative for suicidal ideas.     Past Medical  History:  Diagnosis Date   Anemia    Hx   Arthritis    Diaphragmatic hernia with obstruction    Gastric volvulus    GERD (gastroesophageal reflux disease)    Glaucoma    History of hiatal hernia    Hypertension    Inguinal hernia    left   Pulmonary embolism (HCC)     Past Surgical History:  Procedure Laterality Date   COLONOSCOPY     CYST REMOVAL NECK     DENTAL SURGERY     FEMORAL HERNIA REPAIR Left 07/17/2016   Procedure: LEFT FEMORAL  HERNIA REPAIR WITH MESH;  Surgeon: Donnice Lima, MD;  Location: [facility];  Service: General;  Laterality: Left;   GASTROJEJUNOSTOMY N/A 07/04/2020   Procedure: GASTROSTOMY TUBE;  Surgeon: Signe Mitzie LABOR, MD;  Location: [facility];  Service: General;  Laterality: N/A;   GASTROSTOMY N/A 03/27/2022   Procedure: INSERTION OF GASTROSTOMY TUBE;  Surgeon: Signe Mitzie LABOR, MD;  Location: [facility] OR;  Service: General;  Laterality: N/A;   HIATAL HERNIA REPAIR N/A 03/27/2022   Procedure: LAPAROSCOPIC REPAIR OF HIATAL HERNIA;  Surgeon: Signe Mitzie LABOR, MD;  Location: [facility] OR;  Service: General;  Laterality: N/A;   INCISIONAL HERNIA REPAIR N/A 08/21/2023   Procedure: REPAIR, HERNIA, INCISIONAL;  Surgeon: Lyndel Deward PARAS, MD;  Location: [facility];  Service: General;  Laterality: N/A;  OPEN INCISIONAL HERNIA REPAIR WITH MESH, BILATERAL POSTERIOR RECTUS MYOFASCIAL RELEASE   IVC FILTER INSERTION N/A 05/02/2022   Procedure: IVC FILTER INSERTION;  Surgeon: Magda Debby SAILOR, MD;  [facility]: [facility];  Service: Cardiovascular;  Laterality: N/A;   IVC VENOGRAPHY N/A 05/02/2022   Procedure: IVC Venography;  Surgeon: Magda Debby SAILOR, MD;  Location: [facility];  Service: Cardiovascular;  Laterality: N/A;   LAPAROSCOPIC LYSIS OF ADHESIONS N/A 03/27/2022   Procedure: LAPAROSCOPIC LYSIS OF ADHESIONS;  Surgeon: Signe Mitzie LABOR, MD;  Location: [facility];  Service: General;  Laterality: N/A;   LAPAROTOMY N/A 07/04/2020   Procedure: EXPLORATORY  LAPAROTOMY;  Surgeon: Signe Mitzie LABOR, MD;  Location: [facility];  Service: General;  Laterality: N/A;   LASIK  2023   PLEURAL EFFUSION DRAINAGE Right 05/07/2022   Procedure: DRAINAGE OF PLEURAL EFFUSION;  Surgeon: Kerrin Elspeth BROCKS, MD;  Location: [facility] OR;  Service: Thoracic;  Laterality: Right;   VIDEO ASSISTED THORACOSCOPY (VATS)/DECORTICATION Right 05/07/2022   Procedure: VIDEO ASSISTED THORACOSCOPY (VATS)/DECORTICATION;  Surgeon: Kerrin Elspeth BROCKS, MD;  Location: [facility] OR;  Service: Thoracic;  Laterality: Right;    History reviewed. No pertinent family history.  Social History Reviewed with no changes to be made today.   Outpatient Medications Prior to Visit  Medication Sig Dispense Refill   amLODipine  (NORVASC ) 10 MG tablet Take 1 tablet (10 mg total) by mouth daily. 90 tablet 1   brimonidine -timolol  (COMBIGAN ) 0.2-0.5 % ophthalmic solution Place 1 drop into both eyes 2 (two) times daily.     ferrous sulfate  325 (65 FE) MG EC tablet Take 1 tablet (325 mg total) by mouth daily with breakfast. 90 tablet 3   LUMIGAN  0.01 % SOLN Place 1 drop into both eyes at bedtime. 2.5 mL 0   triamcinolone  cream (KENALOG ) 0.1 % Apply 1 Application topically 2 (two) times daily. 45 g 2   No facility-administered medications prior to visit.    Allergies  Allergen Reactions   Chocolate Hives   Latex Itching    hands       Objective:    BP 112/64 (BP Location: Left Arm, Patient Position: Sitting, Cuff Size: Small)   Pulse (!) 50   Resp 19   Ht 5' 4 (1.626 m)   Wt 106 lb 9.6 oz (48.4 kg)   SpO2 100%   BMI 18.30 kg/m  Wt Readings from Last 3 Encounters:  11/27/23 106 lb 9.6 oz (48.4 kg)  11/20/23 112 lb (50.8 kg)  08/26/23 107 lb (48.5 kg)    Physical Exam Vitals and nursing note reviewed.  Constitutional:      Appearance: She is well-developed.  HENT:     Head: Normocephalic and atraumatic.  Cardiovascular:     Rate and Rhythm: Regular rhythm. Bradycardia present.     Heart  sounds: Normal heart sounds. No murmur heard.    No friction rub. No gallop.     Comments: asymptomatic Pulmonary:     Effort: Pulmonary effort is normal. No tachypnea or respiratory distress.     Breath sounds: Normal breath sounds. No decreased breath sounds, wheezing, rhonchi or rales.  Chest:     Chest wall: No tenderness.  Musculoskeletal:        General: Normal range of motion.     Cervical back: Normal range of motion.  Skin:    General: Skin is warm and dry.  Neurological:     Mental Status: She is alert and oriented to person, place, and time.     Coordination: Coordination normal.  Psychiatric:        Behavior: Behavior normal. Behavior is cooperative.        Thought Content: Thought content normal.  Judgment: Judgment normal.          Patient has been counseled extensively about nutrition and exercise as well as the importance of adherence with medications and regular follow-up. The patient was given clear instructions to go to ER or return to medical center if symptoms don't improve, worsen or new problems develop. The patient verbalized understanding.   Follow-up: Return in about 3 months (around 02/26/2024).   Haze LELON Servant, FNP-BC [facility] [place], [place] [phone]   11/27/2023, 3:04 PM

## 2023-11-27 NOTE — Patient Instructions (Signed)
 LIQUID IRON: FLORADIX

## 2023-11-28 LAB — CMP14+EGFR
ALT: 19 IU/L (ref 0–32)
AST: 21 IU/L (ref 0–40)
Albumin: 3.9 g/dL (ref 3.9–4.9)
Alkaline Phosphatase: 91 IU/L (ref 44–121)
BUN/Creatinine Ratio: 13 (ref 12–28)
BUN: 11 mg/dL (ref 8–27)
Bilirubin Total: 0.3 mg/dL (ref 0.0–1.2)
CO2: 26 mmol/L (ref 20–29)
Calcium: 9.9 mg/dL (ref 8.7–10.3)
Chloride: 105 mmol/L (ref 96–106)
Creatinine, Ser: 0.86 mg/dL (ref 0.57–1.00)
Globulin, Total: 2.7 g/dL (ref 1.5–4.5)
Glucose: 86 mg/dL (ref 70–99)
Potassium: 4.7 mmol/L (ref 3.5–5.2)
Sodium: 143 mmol/L (ref 134–144)
Total Protein: 6.6 g/dL (ref 6.0–8.5)
eGFR: 73 mL/min/1.73 (ref >59)

## 2023-11-28 LAB — HEMOGLOBIN A1C
Est. average glucose Bld gHb Est-mCnc: 120 mg/dL
Hgb A1c MFr Bld: 5.8 % — ABNORMAL HIGH (ref 4.8–5.6)

## 2023-11-29 ENCOUNTER — Ambulatory Visit: Payer: Self-pay | Admitting: Nurse Practitioner

## 2024-02-24 ENCOUNTER — Telehealth: Payer: Self-pay | Admitting: Nurse Practitioner

## 2024-02-24 NOTE — Telephone Encounter (Signed)
 Pt unconfirmed appt 12/3 (per vr ) lvm phone not working

## 2024-02-26 ENCOUNTER — Encounter: Payer: Self-pay | Admitting: Nurse Practitioner

## 2024-02-26 ENCOUNTER — Ambulatory Visit: Attending: Nurse Practitioner | Admitting: Nurse Practitioner

## 2024-02-26 VITALS — BP 108/67 | HR 60 | Resp 19 | Ht 64.0 in | Wt 111.2 lb

## 2024-02-26 DIAGNOSIS — D508 Other iron deficiency anemias: Secondary | ICD-10-CM | POA: Diagnosis not present

## 2024-02-26 DIAGNOSIS — Z23 Encounter for immunization: Secondary | ICD-10-CM | POA: Diagnosis not present

## 2024-02-26 DIAGNOSIS — E782 Mixed hyperlipidemia: Secondary | ICD-10-CM | POA: Diagnosis not present

## 2024-02-26 DIAGNOSIS — I10 Essential (primary) hypertension: Secondary | ICD-10-CM

## 2024-02-26 MED ORDER — AMLODIPINE BESYLATE 10 MG PO TABS: 10.0000 mg | ORAL_TABLET | Freq: Every day | ORAL | 1 refills | Status: AC

## 2024-02-26 NOTE — Progress Notes (Signed)
 Assessment & Plan:  Quanda was seen today for hypertension.  Diagnoses and all orders for this visit:  Primary hypertension -     amLODipine  (NORVASC ) 10 MG tablet; Take 1 tablet (10 mg total) by mouth daily. -     CMP14+EGFR Continue all antihypertensives as prescribed.  Reminded to bring in blood pressure log for follow  up appointment.  RECOMMENDATIONS: DASH/Mediterranean Diets are healthier choices for HTN.    Other iron deficiency anemia -     CBC with Differential -     Iron, TIBC and Ferritin Panel  Need for influenza vaccination -     Flu vaccine HIGH DOSE PF(Fluzone Trivalent)  Mixed hyperlipidemia -     Lipid panel INSTRUCTIONS: Work on a low fat, heart healthy diet and participate in regular aerobic exercise program by working out at least 150 minutes per week; 5 days a week-30 minutes per day. Avoid red meat/beef/steak,  fried foods. junk foods, sodas, sugary drinks, unhealthy snacking, alcohol and smoking.  Drink at least 80 oz of water per day and monitor your carbohydrate intake daily.      Patient has been counseled on age-appropriate routine health concerns for screening and prevention. These are reviewed and up-to-date. Referrals have been placed accordingly. Immunizations are up-to-date or declined.    Subjective:   Chief Complaint  Patient presents with   Hypertension    Alyssa Peterson 73 y.o. female presents to office today for follow up to HTN  She has a past medical history of Anemia, Arthritis, Diaphragmatic hernia with obstruction, Gastric volvulus, GERD, Glaucoma, History of hiatal hernia, Hypertension, Inguinal hernia, and Pulmonary embolism.    HTN Blood pressure is well-controlled on amlodipine  10 mg daily. BP Readings from Last 3 Encounters:  02/26/24 108/67  11/27/23 112/64  11/20/23 115/76     MAMMOGRAM: UTD COLON CANCER SCREENING: UTD  Review of Systems  Constitutional:  Negative for fever, malaise/fatigue and weight loss.   HENT: Negative.  Negative for nosebleeds.   Eyes: Negative.  Negative for blurred vision, double vision and photophobia.  Respiratory: Negative.  Negative for cough and shortness of breath.   Cardiovascular: Negative.  Negative for chest pain, palpitations and leg swelling.  Gastrointestinal: Negative.  Negative for heartburn, nausea and vomiting.  Musculoskeletal: Negative.  Negative for myalgias.  Neurological: Negative.  Negative for dizziness, focal weakness, seizures and headaches.  Psychiatric/Behavioral: Negative.  Negative for suicidal ideas.     Past Medical History:  Diagnosis Date   Anemia    Hx   Arthritis    Diaphragmatic hernia with obstruction    Gastric volvulus    GERD (gastroesophageal reflux disease)    Glaucoma    History of hiatal hernia    Hypertension    Inguinal hernia    left   Pulmonary embolism [facility])     Past Surgical History:  Procedure Laterality Date   COLONOSCOPY     CYST REMOVAL NECK     DENTAL SURGERY     FEMORAL HERNIA REPAIR Left 07/17/2016   Procedure: LEFT FEMORAL  HERNIA REPAIR WITH MESH;  Surgeon: Donnice Lima, MD;  Location: [facility];  Service: General;  Laterality: Left;   GASTROJEJUNOSTOMY N/A 07/04/2020   Procedure: GASTROSTOMY TUBE;  Surgeon: Signe Mitzie LABOR, MD;  Location: [facility];  Service: General;  Laterality: N/A;   GASTROSTOMY N/A 03/27/2022   Procedure: INSERTION OF GASTROSTOMY TUBE;  Surgeon: Signe Mitzie LABOR, MD;  Location: [facility] OR;  Service: General;  Laterality: N/A;   HIATAL HERNIA REPAIR N/A 03/27/2022   Procedure: LAPAROSCOPIC REPAIR OF HIATAL HERNIA;  Surgeon: Signe Mitzie LABOR, MD;  Location: [facility] OR;  Service: General;  Laterality: N/A;   INCISIONAL HERNIA REPAIR N/A 08/21/2023   Procedure: REPAIR, HERNIA, INCISIONAL;  Surgeon: Lyndel Deward PARAS, MD;  Location: [facility];  Service: General;  Laterality: N/A;  OPEN INCISIONAL HERNIA REPAIR WITH MESH, BILATERAL POSTERIOR RECTUS MYOFASCIAL RELEASE   IVC  FILTER INSERTION N/A 05/02/2022   Procedure: IVC FILTER INSERTION;  Surgeon: Magda Debby SAILOR, MD;  Location: [facility];  Service: Cardiovascular;  Laterality: N/A;   IVC VENOGRAPHY N/A 05/02/2022   Procedure: IVC Venography;  Surgeon: Magda Debby SAILOR, MD;  Location: [facility];  Service: Cardiovascular;  Laterality: N/A;   LAPAROSCOPIC LYSIS OF ADHESIONS N/A 03/27/2022   Procedure: LAPAROSCOPIC LYSIS OF ADHESIONS;  Surgeon: Signe Mitzie LABOR, MD;  Location: [facility] OR;  Service: General;  Laterality: N/A;   LAPAROTOMY N/A 07/04/2020   Procedure: EXPLORATORY LAPAROTOMY;  Surgeon: Signe Mitzie LABOR, MD;  Location: [facility] OR;  Service: General;  Laterality: N/A;   LASIK  2023   PLEURAL EFFUSION DRAINAGE Right 05/07/2022   Procedure: DRAINAGE OF PLEURAL EFFUSION;  Surgeon: Kerrin Elspeth BROCKS, MD;  Location: [facility] OR;  Service: Thoracic;  Laterality: Right;   VIDEO ASSISTED THORACOSCOPY (VATS)/DECORTICATION Right 05/07/2022   Procedure: VIDEO ASSISTED THORACOSCOPY (VATS)/DECORTICATION;  Surgeon: Kerrin Elspeth BROCKS, MD;  Location: [facility] OR;  Service: Thoracic;  Laterality: Right;    History reviewed. No pertinent family history.  Social History Reviewed with no changes to be made today.   Outpatient Medications Prior to Visit  Medication Sig Dispense Refill   brimonidine -timolol  (COMBIGAN ) 0.2-0.5 % ophthalmic solution Place 1 drop into both eyes 2 (two) times daily.     ferrous sulfate  325 (65 FE) MG EC tablet Take 1 tablet (325 mg total) by mouth daily with breakfast. 90 tablet 3   LUMIGAN  0.01 % SOLN Place 1 drop into both eyes at bedtime. 2.5 mL 0   triamcinolone  cream (KENALOG ) 0.1 % Apply 1 Application topically 2 (two) times daily. 45 g 2   amLODipine  (NORVASC ) 10 MG tablet Take 1 tablet (10 mg total) by mouth daily. 90 tablet 1   No facility-administered medications prior to visit.    Allergies  Allergen Reactions   Chocolate Hives   Latex Itching    hands       Objective:     BP 108/67 (BP Location: Left Arm, Patient Position: Sitting, Cuff Size: Small)   Pulse 60   Resp 19   Ht 5' 4 (1.626 m)   Wt 111 lb 3.2 oz (50.4 kg)   SpO2 100%   BMI 19.09 kg/m  Wt Readings from Last 3 Encounters:  02/26/24 111 lb 3.2 oz (50.4 kg)  11/27/23 106 lb 9.6 oz (48.4 kg)  11/20/23 112 lb (50.8 kg)    Physical Exam Vitals and nursing note reviewed.  Constitutional:      Appearance: She is well-developed.  HENT:     Head: Normocephalic and atraumatic.  Cardiovascular:     Rate and Rhythm: Normal rate and regular rhythm.     Heart sounds: Normal heart sounds. No murmur heard.    No friction rub. No gallop.  Pulmonary:     Effort: Pulmonary effort is normal. No tachypnea or respiratory distress.     Breath sounds: Normal breath sounds. No decreased breath sounds, wheezing, rhonchi or rales.  Chest:  Chest wall: No tenderness.  Musculoskeletal:        General: Normal range of motion.     Cervical back: Normal range of motion.  Skin:    General: Skin is warm and dry.  Neurological:     Mental Status: She is alert and oriented to person, place, and time.     Coordination: Coordination normal.  Psychiatric:        Behavior: Behavior normal. Behavior is cooperative.        Thought Content: Thought content normal.        Judgment: Judgment normal.          Patient has been counseled extensively about nutrition and exercise as well as the importance of adherence with medications and regular follow-up. The patient was given clear instructions to go to ER or return to medical center if symptoms don't improve, worsen or new problems develop. The patient verbalized understanding.   Follow-up: Return in about 16 weeks (around 06/17/2024).   Haze LELON Servant, FNP-BC [facility] [place], [place] [phone]   02/26/2024, 2:20 PM

## 2024-02-27 ENCOUNTER — Ambulatory Visit: Payer: Self-pay | Admitting: Nurse Practitioner

## 2024-02-27 LAB — CMP14+EGFR
ALT: 23 IU/L (ref 0–32)
AST: 20 IU/L (ref 0–40)
Albumin: 4 g/dL (ref 3.8–4.8)
Alkaline Phosphatase: 92 IU/L (ref 49–135)
BUN/Creatinine Ratio: 20 (ref 12–28)
BUN: 16 mg/dL (ref 8–27)
Bilirubin Total: 0.3 mg/dL (ref 0.0–1.2)
CO2: 26 mmol/L (ref 20–29)
Calcium: 9.8 mg/dL (ref 8.7–10.3)
Chloride: 108 mmol/L — ABNORMAL HIGH (ref 96–106)
Creatinine, Ser: 0.81 mg/dL (ref 0.57–1.00)
Globulin, Total: 2.7 g/dL (ref 1.5–4.5)
Glucose: 106 mg/dL — ABNORMAL HIGH (ref 70–99)
Potassium: 4.1 mmol/L (ref 3.5–5.2)
Sodium: 140 mmol/L (ref 134–144)
Total Protein: 6.7 g/dL (ref 6.0–8.5)
eGFR: 78 mL/min/1.73 (ref >59)

## 2024-02-27 LAB — IRON,TIBC AND FERRITIN PANEL
Ferritin: 59 ng/mL (ref 15–150)
Iron Saturation: 27 % (ref 15–55)
Iron: 84 ug/dL (ref 27–139)
Total Iron Binding Capacity: 315 ug/dL (ref 250–450)
UIBC: 231 ug/dL (ref 118–369)

## 2024-02-27 LAB — CBC WITH DIFFERENTIAL/PLATELET
Basophils Absolute: 0 x10E3/uL (ref 0.0–0.2)
Basos: 1 %
EOS (ABSOLUTE): 0.3 x10E3/uL (ref 0.0–0.4)
Eos: 7 %
Hematocrit: 38.6 % (ref 34.0–46.6)
Hemoglobin: 12.9 g/dL (ref 11.1–15.9)
Immature Grans (Abs): 0 x10E3/uL (ref 0.0–0.1)
Immature Granulocytes: 0 %
Lymphocytes Absolute: 1.2 x10E3/uL (ref 0.7–3.1)
Lymphs: 31 %
MCH: 30.4 pg (ref 26.6–33.0)
MCHC: 33.4 g/dL (ref 31.5–35.7)
MCV: 91 fL (ref 79–97)
Monocytes Absolute: 0.4 x10E3/uL (ref 0.1–0.9)
Monocytes: 11 %
Neutrophils Absolute: 1.9 x10E3/uL (ref 1.4–7.0)
Neutrophils: 50 %
Platelets: 207 x10E3/uL (ref 150–450)
RBC: 4.24 x10E6/uL (ref 3.77–5.28)
RDW: 13.2 % (ref 11.7–15.4)
WBC: 3.8 x10E3/uL (ref 3.4–10.8)

## 2024-02-27 LAB — LIPID PANEL
Chol/HDL Ratio: 1.7 ratio (ref 0.0–4.4)
Cholesterol, Total: 130 mg/dL (ref 100–199)
HDL: 75 mg/dL (ref >39)
LDL Chol Calc (NIH): 39 mg/dL (ref 0–99)
Triglycerides: 86 mg/dL (ref 0–149)
VLDL Cholesterol Cal: 16 mg/dL (ref 5–40)

## 2024-06-17 ENCOUNTER — Ambulatory Visit: Admitting: Nurse Practitioner
